# Patient Record
Sex: Male | Born: 1945 | Race: White | Hispanic: No | Smoking: Former smoker
Health system: Southern US, Community
[De-identification: ages and names within clinical notes are randomized; demographics above are authoritative.]

## PROBLEM LIST (undated history)

## (undated) ENCOUNTER — Emergency Department (HOSPITAL_COMMUNITY): Admission: EM | Payer: Medicare Other | Source: Home / Self Care

## (undated) DIAGNOSIS — I255 Ischemic cardiomyopathy: Secondary | ICD-10-CM

## (undated) DIAGNOSIS — I4510 Unspecified right bundle-branch block: Secondary | ICD-10-CM

## (undated) DIAGNOSIS — I2510 Atherosclerotic heart disease of native coronary artery without angina pectoris: Secondary | ICD-10-CM

## (undated) DIAGNOSIS — Z951 Presence of aortocoronary bypass graft: Secondary | ICD-10-CM

## (undated) DIAGNOSIS — E119 Type 2 diabetes mellitus without complications: Secondary | ICD-10-CM

## (undated) DIAGNOSIS — Z95810 Presence of automatic (implantable) cardiac defibrillator: Secondary | ICD-10-CM

## (undated) DIAGNOSIS — I480 Paroxysmal atrial fibrillation: Secondary | ICD-10-CM

## (undated) DIAGNOSIS — I10 Essential (primary) hypertension: Secondary | ICD-10-CM

## (undated) DIAGNOSIS — I82409 Acute embolism and thrombosis of unspecified deep veins of unspecified lower extremity: Secondary | ICD-10-CM

## (undated) DIAGNOSIS — E785 Hyperlipidemia, unspecified: Secondary | ICD-10-CM

## (undated) HISTORY — DX: Unspecified right bundle-branch block: I45.10

## (undated) HISTORY — DX: Essential (primary) hypertension: I10

## (undated) HISTORY — DX: Hyperlipidemia, unspecified: E78.5

## (undated) HISTORY — DX: Presence of automatic (implantable) cardiac defibrillator: Z95.810

## (undated) HISTORY — DX: Type 2 diabetes mellitus without complications: E11.9

## (undated) HISTORY — DX: Presence of aortocoronary bypass graft: Z95.1

## (undated) HISTORY — DX: Ischemic cardiomyopathy: I25.5

## (undated) HISTORY — DX: Atherosclerotic heart disease of native coronary artery without angina pectoris: I25.10

## (undated) HISTORY — PX: BACK SURGERY: SHX140

## (undated) HISTORY — DX: Paroxysmal atrial fibrillation: I48.0

---

## 1995-12-31 HISTORY — PX: CORONARY ARTERY BYPASS GRAFT: SHX141

## 1997-08-22 ENCOUNTER — Other Ambulatory Visit: Admission: RE | Admit: 1997-08-22 | Discharge: 1997-08-22 | Payer: Self-pay | Admitting: General Surgery

## 1997-08-27 ENCOUNTER — Ambulatory Visit (HOSPITAL_BASED_OUTPATIENT_CLINIC_OR_DEPARTMENT_OTHER): Admission: RE | Admit: 1997-08-27 | Discharge: 1997-08-27 | Payer: Self-pay | Admitting: General Surgery

## 1999-09-29 ENCOUNTER — Encounter: Payer: Self-pay | Admitting: Cardiovascular Disease

## 1999-09-29 ENCOUNTER — Inpatient Hospital Stay (HOSPITAL_COMMUNITY): Admission: AD | Admit: 1999-09-29 | Discharge: 1999-10-01 | Payer: Self-pay | Admitting: Cardiovascular Disease

## 2001-03-17 ENCOUNTER — Encounter: Payer: Self-pay | Admitting: Neurosurgery

## 2001-03-17 ENCOUNTER — Ambulatory Visit (HOSPITAL_COMMUNITY): Admission: RE | Admit: 2001-03-17 | Discharge: 2001-03-17 | Payer: Self-pay | Admitting: Neurosurgery

## 2001-04-06 ENCOUNTER — Encounter: Admission: RE | Admit: 2001-04-06 | Discharge: 2001-05-04 | Payer: Self-pay | Admitting: Neurosurgery

## 2002-05-31 ENCOUNTER — Encounter: Payer: Self-pay | Admitting: Emergency Medicine

## 2002-05-31 ENCOUNTER — Inpatient Hospital Stay (HOSPITAL_COMMUNITY): Admission: EM | Admit: 2002-05-31 | Discharge: 2002-06-01 | Payer: Self-pay | Admitting: Emergency Medicine

## 2002-05-31 ENCOUNTER — Encounter: Payer: Self-pay | Admitting: Cardiovascular Disease

## 2003-10-20 ENCOUNTER — Inpatient Hospital Stay (HOSPITAL_COMMUNITY): Admission: AD | Admit: 2003-10-20 | Discharge: 2003-10-22 | Payer: Self-pay | Admitting: Emergency Medicine

## 2004-07-15 ENCOUNTER — Inpatient Hospital Stay (HOSPITAL_COMMUNITY): Admission: AD | Admit: 2004-07-15 | Discharge: 2004-07-17 | Payer: Self-pay | Admitting: Internal Medicine

## 2004-10-04 ENCOUNTER — Emergency Department (HOSPITAL_COMMUNITY): Admission: EM | Admit: 2004-10-04 | Discharge: 2004-10-04 | Payer: Self-pay | Admitting: Emergency Medicine

## 2004-10-10 ENCOUNTER — Inpatient Hospital Stay (HOSPITAL_COMMUNITY): Admission: AD | Admit: 2004-10-10 | Discharge: 2004-10-18 | Payer: Self-pay | Admitting: Internal Medicine

## 2004-11-25 ENCOUNTER — Ambulatory Visit (HOSPITAL_COMMUNITY): Admission: RE | Admit: 2004-11-25 | Discharge: 2004-11-25 | Payer: Self-pay | Admitting: Urology

## 2005-02-18 ENCOUNTER — Ambulatory Visit (HOSPITAL_COMMUNITY): Admission: RE | Admit: 2005-02-18 | Discharge: 2005-02-18 | Payer: Self-pay | Admitting: Family Medicine

## 2005-03-25 ENCOUNTER — Emergency Department (HOSPITAL_COMMUNITY): Admission: EM | Admit: 2005-03-25 | Discharge: 2005-03-26 | Payer: Self-pay | Admitting: Emergency Medicine

## 2005-03-25 ENCOUNTER — Ambulatory Visit: Payer: Self-pay | Admitting: Orthopedic Surgery

## 2005-03-27 ENCOUNTER — Ambulatory Visit (HOSPITAL_COMMUNITY): Admission: RE | Admit: 2005-03-27 | Discharge: 2005-03-27 | Payer: Self-pay | Admitting: Family Medicine

## 2005-12-30 ENCOUNTER — Ambulatory Visit (HOSPITAL_COMMUNITY): Admission: RE | Admit: 2005-12-30 | Discharge: 2005-12-30 | Payer: Self-pay | Admitting: Internal Medicine

## 2006-11-17 ENCOUNTER — Encounter (INDEPENDENT_AMBULATORY_CARE_PROVIDER_SITE_OTHER): Payer: Self-pay | Admitting: Internal Medicine

## 2006-11-17 ENCOUNTER — Inpatient Hospital Stay (HOSPITAL_COMMUNITY): Admission: EM | Admit: 2006-11-17 | Discharge: 2006-11-18 | Payer: Self-pay | Admitting: Emergency Medicine

## 2006-12-07 ENCOUNTER — Ambulatory Visit: Payer: Self-pay | Admitting: Vascular Surgery

## 2006-12-07 ENCOUNTER — Inpatient Hospital Stay (HOSPITAL_COMMUNITY): Admission: EM | Admit: 2006-12-07 | Discharge: 2006-12-10 | Payer: Self-pay | Admitting: Emergency Medicine

## 2006-12-07 ENCOUNTER — Ambulatory Visit: Payer: Self-pay | Admitting: Oncology

## 2006-12-07 ENCOUNTER — Encounter: Payer: Self-pay | Admitting: Vascular Surgery

## 2006-12-14 ENCOUNTER — Ambulatory Visit: Payer: Self-pay | Admitting: Oncology

## 2006-12-24 ENCOUNTER — Ambulatory Visit: Payer: Self-pay | Admitting: Vascular Surgery

## 2007-01-05 ENCOUNTER — Inpatient Hospital Stay (HOSPITAL_COMMUNITY): Admission: EM | Admit: 2007-01-05 | Discharge: 2007-01-12 | Payer: Self-pay | Admitting: *Deleted

## 2007-01-07 ENCOUNTER — Encounter (INDEPENDENT_AMBULATORY_CARE_PROVIDER_SITE_OTHER): Payer: Self-pay | Admitting: Cardiology

## 2007-01-07 ENCOUNTER — Ambulatory Visit: Payer: Self-pay | Admitting: Surgery

## 2007-02-15 ENCOUNTER — Emergency Department (HOSPITAL_COMMUNITY): Admission: EM | Admit: 2007-02-15 | Discharge: 2007-02-16 | Payer: Self-pay | Admitting: Emergency Medicine

## 2007-02-16 ENCOUNTER — Ambulatory Visit (HOSPITAL_COMMUNITY): Admission: RE | Admit: 2007-02-16 | Discharge: 2007-02-16 | Payer: Self-pay | Admitting: Family Medicine

## 2007-02-17 ENCOUNTER — Emergency Department (HOSPITAL_COMMUNITY): Admission: EM | Admit: 2007-02-17 | Discharge: 2007-02-17 | Payer: Self-pay | Admitting: Emergency Medicine

## 2007-02-22 ENCOUNTER — Ambulatory Visit: Payer: Self-pay | Admitting: Oncology

## 2007-08-12 ENCOUNTER — Ambulatory Visit (HOSPITAL_COMMUNITY): Admission: RE | Admit: 2007-08-12 | Discharge: 2007-08-12 | Payer: Self-pay | Admitting: Internal Medicine

## 2008-11-24 ENCOUNTER — Encounter (INDEPENDENT_AMBULATORY_CARE_PROVIDER_SITE_OTHER): Payer: Self-pay | Admitting: Emergency Medicine

## 2008-11-24 ENCOUNTER — Inpatient Hospital Stay (HOSPITAL_COMMUNITY): Admission: EM | Admit: 2008-11-24 | Discharge: 2008-12-01 | Payer: Self-pay | Admitting: Emergency Medicine

## 2008-11-25 ENCOUNTER — Ambulatory Visit: Payer: Self-pay | Admitting: Surgery

## 2008-11-25 ENCOUNTER — Encounter: Payer: Self-pay | Admitting: Surgery

## 2008-11-26 ENCOUNTER — Encounter: Payer: Self-pay | Admitting: Cardiovascular Disease

## 2008-11-27 ENCOUNTER — Encounter: Payer: Self-pay | Admitting: Surgery

## 2008-11-28 ENCOUNTER — Encounter: Payer: Self-pay | Admitting: Surgery

## 2008-12-05 ENCOUNTER — Ambulatory Visit: Payer: Self-pay | Admitting: Gastroenterology

## 2008-12-05 ENCOUNTER — Inpatient Hospital Stay (HOSPITAL_COMMUNITY): Admission: EM | Admit: 2008-12-05 | Discharge: 2008-12-10 | Payer: Self-pay | Admitting: Emergency Medicine

## 2008-12-07 ENCOUNTER — Encounter: Payer: Self-pay | Admitting: Gastroenterology

## 2008-12-10 ENCOUNTER — Encounter: Payer: Self-pay | Admitting: Gastroenterology

## 2008-12-12 ENCOUNTER — Emergency Department (HOSPITAL_COMMUNITY): Admission: EM | Admit: 2008-12-12 | Discharge: 2008-12-12 | Payer: Self-pay | Admitting: Emergency Medicine

## 2008-12-14 ENCOUNTER — Emergency Department (HOSPITAL_COMMUNITY): Admission: EM | Admit: 2008-12-14 | Discharge: 2008-12-15 | Payer: Self-pay | Admitting: Emergency Medicine

## 2008-12-17 ENCOUNTER — Ambulatory Visit: Payer: Self-pay | Admitting: Surgery

## 2009-01-07 ENCOUNTER — Ambulatory Visit: Payer: Self-pay | Admitting: Surgery

## 2009-01-28 ENCOUNTER — Ambulatory Visit: Payer: Self-pay | Admitting: Surgery

## 2009-02-11 ENCOUNTER — Ambulatory Visit: Payer: Self-pay | Admitting: Surgery

## 2009-04-26 ENCOUNTER — Ambulatory Visit: Payer: Self-pay | Admitting: Surgery

## 2009-05-07 ENCOUNTER — Encounter: Payer: Self-pay | Admitting: Emergency Medicine

## 2009-05-07 ENCOUNTER — Encounter (INDEPENDENT_AMBULATORY_CARE_PROVIDER_SITE_OTHER): Payer: Self-pay | Admitting: Cardiovascular Disease

## 2009-05-07 ENCOUNTER — Inpatient Hospital Stay (HOSPITAL_COMMUNITY): Admission: EM | Admit: 2009-05-07 | Discharge: 2009-05-13 | Payer: Self-pay | Admitting: Cardiovascular Disease

## 2009-05-07 ENCOUNTER — Ambulatory Visit: Payer: Self-pay | Admitting: Internal Medicine

## 2009-05-08 HISTORY — PX: CARDIAC CATHETERIZATION: SHX172

## 2009-05-09 HISTORY — PX: CARDIAC DEFIBRILLATOR PLACEMENT: SHX171

## 2009-05-10 ENCOUNTER — Encounter: Payer: Self-pay | Admitting: Internal Medicine

## 2009-05-27 ENCOUNTER — Ambulatory Visit: Payer: Self-pay

## 2009-05-27 ENCOUNTER — Encounter: Payer: Self-pay | Admitting: Internal Medicine

## 2009-06-05 ENCOUNTER — Ambulatory Visit: Payer: Self-pay | Admitting: Internal Medicine

## 2009-07-02 ENCOUNTER — Emergency Department (HOSPITAL_COMMUNITY): Admission: EM | Admit: 2009-07-02 | Discharge: 2009-07-02 | Payer: Self-pay | Admitting: Emergency Medicine

## 2009-07-14 ENCOUNTER — Encounter: Payer: Self-pay | Admitting: Emergency Medicine

## 2009-07-14 ENCOUNTER — Inpatient Hospital Stay (HOSPITAL_COMMUNITY): Admission: EM | Admit: 2009-07-14 | Discharge: 2009-07-18 | Payer: Self-pay | Admitting: Cardiology

## 2009-07-14 ENCOUNTER — Ambulatory Visit: Payer: Self-pay | Admitting: Internal Medicine

## 2009-07-15 ENCOUNTER — Encounter: Payer: Self-pay | Admitting: Internal Medicine

## 2009-08-20 ENCOUNTER — Ambulatory Visit: Payer: Self-pay | Admitting: Internal Medicine

## 2009-08-20 DIAGNOSIS — E1122 Type 2 diabetes mellitus with diabetic chronic kidney disease: Secondary | ICD-10-CM

## 2009-08-20 DIAGNOSIS — Z95810 Presence of automatic (implantable) cardiac defibrillator: Secondary | ICD-10-CM

## 2009-08-20 DIAGNOSIS — Z950 Presence of cardiac pacemaker: Secondary | ICD-10-CM

## 2009-08-20 DIAGNOSIS — N183 Chronic kidney disease, stage 3 (moderate): Secondary | ICD-10-CM

## 2009-08-20 DIAGNOSIS — I5022 Chronic systolic (congestive) heart failure: Secondary | ICD-10-CM

## 2010-04-06 ENCOUNTER — Encounter: Payer: Self-pay | Admitting: Emergency Medicine

## 2010-04-06 ENCOUNTER — Encounter: Payer: Self-pay | Admitting: Internal Medicine

## 2010-04-15 NOTE — Assessment & Plan Note (Signed)
Summary: pc2/medtronic   Visit Type:  Follow-up   History of Present Illness: [name] returns today for ICD followup.  He is a pleasant 65 yo man with a h/o chronic systolic CHF, DM, HTN, and dyslipidemia.  He underwent BiV ICD implant in 04/2009 and returns today for followup.  He denies c/p, sob, or peripheral edema.  No intercurrent ICD therapies.  Current Medications (verified): 1)  Pradaxa 150 Mg Caps (Dabigatran Etexilate Mesylate) .Marland Kitchen.. 1 Tablet Two Times A Day 2)  Furosemide 20 Mg Tabs (Furosemide) .... As Needed 3)  Aspirin 81 Mg Tbec (Aspirin) .... Take One Tablet By Mouth Daily 4)  Metoprolol Tartrate 50 Mg Tabs (Metoprolol Tartrate) .... Take One Tablet By Mouth Twice A Day 5)  Synthroid 112 Mcg Tabs (Levothyroxine Sodium) .... Take One Tablet By Mouth Once Daily. 6)  Actos 45 Mg Tabs (Pioglitazone Hcl) .... Take One Tablet By Mouth Once Daily. 7)  Niaspan 1000 Mg Cr-Tabs (Niacin (Antihyperlipidemic)) .Marland Kitchen.. 1 Tablet At Bedtime 8)  Amiodarone Hcl 200 Mg Tabs (Amiodarone Hcl) .... Take One Tablet By Mouth Daily 9)  Zetia 10 Mg Tabs (Ezetimibe) .... Take One Tablet By Mouth Daily. 10)  Ambien 10 Mg Tabs (Zolpidem Tartrate) .... At Bedtime 11)  Digoxin 0.125 Mg Tabs (Digoxin) .... Take One Tablet By Mouth Daily 12)  Tylox 5-500 Mg Caps (Oxycodone-Acetaminophen) .... As Needed 13)  Lipitor 40 Mg Tabs (Atorvastatin Calcium) .... Take One Tablet By Mouth Daily. 14)  Metformin Hcl 500 Mg Tabs (Metformin Hcl) .... Take One Tablet By Mouth Twice Daily. 15)  Mag-Ox 400 400 Mg Tabs (Magnesium Oxide) .... Take One Tablet By Mouth Once Daily. 16)  Spironolactone 25 Mg Tabs (Spironolactone) .... Take One Tablet By Mouth Daily 17)  Ramipril 2.5 Mg Caps (Ramipril) .... Take One Capsule By Mouth Daily  Allergies (verified): No Known Drug Allergies  Past History:  Past Medical History: Current Problems:  DM (ICD-250.00) PACEMAKER, PERMANENT (ICD-V45.01)    Review of Systems  The  patient denies chest pain, syncope, dyspnea on exertion, and peripheral edema.    Vital Signs:  Patient profile:   65 year old male Height:      74 inches Weight:      226 pounds BMI:     29.12 Pulse rate:   75 / minute BP sitting:   150 / 80  (left arm)  Vitals Entered By: Laurance Flatten CMA (August 20, 2009 2:07 PM)  Physical Exam  General:  Elderly, well developed, well nourished, in no acute distress.  HEENT: normal Neck: supple. No JVD. Carotids 2+ bilaterally no bruits Cor: RRR no rubs, gallops or murmur Lungs: CTA.  Well healed ICD incision. Ab: soft, nontender. nondistended. No HSM. Good bowel sounds Ext: warm. no cyanosis, clubbing or edema Neuro: alert and oriented. Grossly nonfocal. affect pleasant     ICD Specifications Following MD:  Lewayne Bunting, MD     Referring MD:  BERRY ICD Vendor:  Medtronic     ICD Model Number:  [id number]     ICD Serial Number:  [id number] ICD DOI:  05/09/2009     ICD Implanting MD:  Lewayne Bunting, MD  Lead 1:    Location: RA     DOI: 05/09/2009     Model #: [id number]     Serial #: [id number]     Status: active Lead 2:    Location: RV     DOI: 05/09/2009     Model #: [id number]  Serial #: [id number]     Status: active Lead 3:    Location: LV     DOI: 05/09/2008     Model #: [id number]     Serial #: [id number]     Status: active  Indications::  VT; ICM   ICD Follow Up Battery Voltage:  3.20 V     Charge Time:  8.5 seconds     Underlying rhythm:  SB AT 40 ICD Dependent:  No       ICD Device Measurements Atrium:  Amplitude: 1.4 mV, Impedance: 532 ohms, Threshold: 0.75 V at 0.40 msec Right Ventricle:  Amplitude: 12.8 mV, Impedance: 532 ohms, Threshold: 0.75 V at 0.40 msec Left Ventricle:  Impedance: 627 ohms, Threshold: 0.75 V at 0.40 msec Configuration: LV TIP TO RV COIL Shock Impedance: 53/71 ohms   Episodes MS Episodes:  0     Percent Mode Switch:  0     Shock:  0     ATP:  0     Nonsustained:  0     Atrial Therapies:  0 Atrial Pacing:  66%      Ventricular Pacing:  99.4%  Brady Parameters Mode DDDR     Lower Rate Limit:  50     Upper Rate Limit 130 PAV 220     Sensed AV Delay:  200  Tachy Zones VF:  207     VT:  240 FVT VIA VF     VT1:  162     Tech Comments:  NORMAL DEVICE FUNCTION.  PT FOLLOWED THRU [facility].  RESEARCH STUDY.  CHANGED RV AMPLITUDE FROM 2.00 TO 2.5O V.  OPTIVOL STABLE. Vella Kohler  August 20, 2009 2:46 PM  MD Comments:  Agree with above.  Impression & Recommendations:  Problem # 1:  AUTOMATIC IMPLANTABLE CARDIAC DEFIBRILLATOR SITU (ICD-V45.02) His device is working normally.  Will recheck in several months.  Problem # 2:  CHRONIC SYSTOLIC HEART FAILURE (ICD-428.22) He remains class 2.  He will continue his current meds and followup in several months. His updated medication list for this problem includes:    Furosemide 20 Mg Tabs (Furosemide) .Marland Kitchen... As needed    Aspirin 81 Mg Tbec (Aspirin) .Marland Kitchen... Take one tablet by mouth daily    Metoprolol Tartrate 50 Mg Tabs (Metoprolol tartrate) .Marland Kitchen... Take one tablet by mouth twice a day    Amiodarone Hcl 200 Mg Tabs (Amiodarone hcl) .Marland Kitchen... Take one tablet by mouth daily    Digoxin 0.125 Mg Tabs (Digoxin) .Marland Kitchen... Take one tablet by mouth daily    Spironolactone 25 Mg Tabs (Spironolactone) .Marland Kitchen... Take one tablet by mouth daily    Ramipril 2.5 Mg Caps (Ramipril) .Marland Kitchen... Take one capsule by mouth daily  Patient Instructions: 1)  Your physician recommends that you schedule a follow-up appointment in: in 6 months with Dr Ladona Ridgel

## 2010-04-15 NOTE — Miscellaneous (Signed)
Summary: Device preload  Clinical Lists Changes  Observations: Added new observation of ICD INDICATN: VT; Low EF (05/10/2009 16:29) Added new observation of ICDLEADSTAT3: active (05/10/2009 16:29) Added new observation of ICDLEADSER3: [id number] (05/10/2009 16:29) Added new observation of ICDLEADMOD3: 4196  (05/10/2009 16:29) Added new observation of ICDLEADLOC3: LV  (05/10/2009 16:29) Added new observation of ICDLEADSTAT2: active  (05/10/2009 16:29) Added new observation of ICDLEADSER2: [id number] V  (05/10/2009 16:29) Added new observation of ICDLEADMOD2: 4627  (05/10/2009 16:29) Added new observation of ICDLEADLOC2: RV  (05/10/2009 16:29) Added new observation of ICDLEADSTAT1: active  (05/10/2009 16:29) Added new observation of ICDLEADSER1: [id number]  (05/10/2009 16:29) Added new observation of ICDLEADMOD1: 5076  (05/10/2009 16:29) Added new observation of ICDLEADLOC1: RA  (05/10/2009 16:29) Added new observation of ICD IMP MD: Alan Bunting, MD  (05/10/2009 16:29) Added new observation of ICDLEADDOI2: 05/09/2009  (05/10/2009 16:29) Added new observation of ICDLEADDOI1: 05/09/2009  (05/10/2009 16:29) Added new observation of ICD IMPL DTE: 05/09/2009  (05/10/2009 16:29) Added new observation of ICD SERL#: [id number]  (05/10/2009 16:29) Added new observation of ICD MODL#: [id number]  (05/10/2009 16:29) Added new observation of ICDMANUFACTR: Medtronic  (05/10/2009 16:29) Added new observation of ICD MD: Alan Bunting, MD  (05/10/2009 16:29)       ICD Specifications Following MD:  Alan Bunting, MD     ICD Vendor:  Medtronic     ICD Model Number:  [phone]     ICD Serial Number:  [id number] ICD DOI:  05/09/2009     ICD Implanting MD:  Alan Bunting, MD  Lead 1:    Location: RA     DOI: 05/09/2009     Model #: [id number]     Serial #: [id number]     Status: active Lead 2:    Location: RV     DOI: 05/09/2009     Model #: 6761     Serial #: [id number]     Status: active Lead 3:    Location: LV      Model #: [id number]     Serial #: [id number]     Status: active  Indications::  VT; Low EF

## 2010-04-15 NOTE — Cardiovascular Report (Signed)
Summary: Office Visit   Office Visit   Imported By: Roderic Ovens 08/30/2009 11:41:04  _____________________________________________________________________  External Attachment:    Type:   Image     Comment:   External Document

## 2010-04-15 NOTE — Cardiovascular Report (Signed)
Summary: Office Visit   Office Visit   Imported By: Roderic Ovens 06/13/2009 11:03:33  _____________________________________________________________________  External Attachment:    Type:   Image     Comment:   External Document

## 2010-04-15 NOTE — Procedures (Signed)
Summary: wound check    ICD Specifications Following MD:  Lewayne Bunting, MD     Referring MD:  BERRY ICD Vendor:  Medtronic     ICD Model Number:  [phone]     ICD Serial Number:  [id number] ICD DOI:  05/09/2009     ICD Implanting MD:  Lewayne Bunting, MD  Lead 1:    Location: RA     DOI: 05/09/2009     Model #: 7253     Serial #: [id number]     Status: active Lead 2:    Location: RV     DOI: 05/09/2009     Model #: [id number]     Serial #: [id number]     Status: active Lead 3:    Location: LV     DOI: 05/09/2008     Model #: 9518     Serial #: [id number]     Status: active  Indications::  VT; ICM   ICD Follow Up Remote Check?  No Battery Voltage:  3.22 V     Charge Time:  8.5 seconds     Underlying rhythm:  SR ICD Dependent:  No       ICD Device Measurements Atrium:  Amplitude: 1.0 mV, Impedance: 418 ohms, Threshold: 1.0 V at 0.4 msec Right Ventricle:  Amplitude: 12.5 mV, Impedance: 494 ohms, Threshold: 0.5 V at 0.4 msec Left Ventricle:  Impedance: 532 ohms, Threshold: 0.5 V at 0.4 msec Configuration: LV TIP TO RV COIL Shock Impedance: 45/55 ohms   Episodes MS Episodes:  0     Shock:  0     ATP:  1     Nonsustained:  0     Atrial Pacing:  33.1%     Ventricular Pacing:  91.0%  Brady Parameters Mode DDDR     Lower Rate Limit:  50     Upper Rate Limit 130 PAV 220     Sensed AV Delay:  200  Tachy Zones VF:  207     VT:  240 FVT VIA VF     VT1:  162     Next Cardiology Appt Due:  08/20/2009 Tech Comments:  Wound check appt.  Steri-strips removed.  Wound without redness or edema and well healed.  Normal device function.  1 episode of VT with a cycle length of treated with 1 round of burst pacing ATP on February 28th.  Pt asymptomatic with this episode.  Pt had sustained monomorphic VT while in hospital.  No changes made today.  ROV 3 months GT then plan [facility] to follow device. Gypsy Balsam RN BSN  May 27, 2009 10:57 AM  MD Comments:  Agree with above.

## 2010-06-03 LAB — COMPREHENSIVE METABOLIC PANEL
ALT: 18 U/L (ref 0–53)
ALT: 19 U/L (ref 0–53)
ALT: 19 U/L (ref 0–53)
ALT: 20 U/L (ref 0–53)
AST: 20 U/L (ref 0–37)
AST: 20 U/L (ref 0–37)
AST: 22 U/L (ref 0–37)
AST: 28 U/L (ref 0–37)
Albumin: 2.9 g/dL — ABNORMAL LOW (ref 3.5–5.2)
Albumin: 3.5 g/dL (ref 3.5–5.2)
Alkaline Phosphatase: 51 U/L (ref 39–117)
Alkaline Phosphatase: 58 U/L (ref 39–117)
CO2: 29 mEq/L (ref 19–32)
CO2: 29 mEq/L (ref 19–32)
CO2: 29 mEq/L (ref 19–32)
Calcium: 8.4 mg/dL (ref 8.4–10.5)
Calcium: 8.5 mg/dL (ref 8.4–10.5)
Calcium: 8.8 mg/dL (ref 8.4–10.5)
Calcium: 9.3 mg/dL (ref 8.4–10.5)
Chloride: 99 mEq/L (ref 96–112)
Creatinine, Ser: 1.2 mg/dL (ref 0.4–1.5)
Creatinine, Ser: 1.62 mg/dL — ABNORMAL HIGH (ref 0.4–1.5)
GFR calc Af Amer: 48 mL/min — ABNORMAL LOW (ref >60)
GFR calc Af Amer: 52 mL/min — ABNORMAL LOW (ref >60)
GFR calc Af Amer: >60 mL/min (ref >60)
GFR calc Af Amer: >60 mL/min (ref >60)
GFR calc non Af Amer: 39 mL/min — ABNORMAL LOW (ref >60)
Glucose, Bld: 100 mg/dL — ABNORMAL HIGH (ref 70–99)
Glucose, Bld: 81 mg/dL (ref 70–99)
Potassium: 3.5 mEq/L (ref 3.5–5.1)
Potassium: 3.8 mEq/L (ref 3.5–5.1)
Potassium: 3.9 mEq/L (ref 3.5–5.1)
Sodium: 131 mEq/L — ABNORMAL LOW (ref 135–145)
Sodium: 136 mEq/L (ref 135–145)
Sodium: 137 mEq/L (ref 135–145)
Sodium: 137 mEq/L (ref 135–145)
Total Protein: 5.3 g/dL — ABNORMAL LOW (ref 6.0–8.3)
Total Protein: 5.7 g/dL — ABNORMAL LOW (ref 6.0–8.3)
Total Protein: 6 g/dL (ref 6.0–8.3)

## 2010-06-03 LAB — URINALYSIS, ROUTINE W REFLEX MICROSCOPIC
Bilirubin Urine: NEGATIVE
Bilirubin Urine: NEGATIVE
Glucose, UA: NEGATIVE mg/dL
Glucose, UA: 250 mg/dL — AB
Hgb urine dipstick: NEGATIVE
Ketones, ur: NEGATIVE mg/dL
Ketones, ur: NEGATIVE mg/dL
Nitrite: NEGATIVE
Protein, ur: NEGATIVE mg/dL
Specific Gravity, Urine: 1.011 (ref 1.005–1.030)
pH: 6 (ref 5.0–8.0)

## 2010-06-03 LAB — DIFFERENTIAL
Basophils Absolute: 0 10*3/uL (ref 0.0–0.1)
Basophils Relative: 1 % (ref 0–1)
Basophils Relative: 1 % (ref 0–1)
Eosinophils Absolute: 0.2 10*3/uL (ref 0.0–0.7)
Eosinophils Relative: 2 % (ref 0–5)
Eosinophils Relative: 3 % (ref 0–5)
Lymphocytes Relative: 46 % (ref 12–46)
Monocytes Absolute: 1 10*3/uL (ref 0.1–1.0)
Monocytes Relative: 10 % (ref 3–12)

## 2010-06-03 LAB — CARDIAC PANEL(CRET KIN+CKTOT+MB+TROPI)
CK, MB: 0.9 ng/mL (ref 0.3–4.0)
CK, MB: 1.4 ng/mL (ref 0.3–4.0)
CK, MB: 1.6 ng/mL (ref 0.3–4.0)
CK, MB: 2 ng/mL (ref 0.3–4.0)
CK, MB: 2 ng/mL (ref 0.3–4.0)
Relative Index: 1.6 (ref 0.0–2.5)
Relative Index: INVALID (ref 0.0–2.5)
Relative Index: INVALID (ref 0.0–2.5)
Total CK: 122 U/L (ref 7–232)
Total CK: 123 U/L (ref 7–232)
Total CK: 69 U/L (ref 7–232)
Total CK: 96 U/L (ref 7–232)
Troponin I: 0.01 ng/mL (ref 0.00–0.06)
Troponin I: 0.01 ng/mL (ref 0.00–0.06)
Troponin I: 0.04 ng/mL (ref 0.00–0.06)

## 2010-06-03 LAB — CBC
HCT: 34 % — ABNORMAL LOW (ref 39.0–52.0)
Hemoglobin: 13.6 g/dL (ref 13.0–17.0)
Hemoglobin: 14.1 g/dL (ref 13.0–17.0)
Hemoglobin: 12.9 g/dL — ABNORMAL LOW (ref 13.0–17.0)
MCHC: 33.4 g/dL (ref 30.0–36.0)
MCHC: 33.6 g/dL (ref 30.0–36.0)
MCHC: 34.1 g/dL (ref 30.0–36.0)
MCHC: 34.3 g/dL (ref 30.0–36.0)
MCHC: 34.3 g/dL (ref 30.0–36.0)
MCV: 103 fL — ABNORMAL HIGH (ref 78.0–100.0)
MCV: 103.3 fL — ABNORMAL HIGH (ref 78.0–100.0)
MCV: 101.5 fL — ABNORMAL HIGH (ref 78.0–100.0)
Platelets: 136 10*3/uL — ABNORMAL LOW (ref 150–400)
Platelets: 182 10*3/uL (ref 150–400)
Platelets: 160 10*3/uL (ref 150–400)
RBC: 3.52 MIL/uL — ABNORMAL LOW (ref 4.22–5.81)
RBC: 3.83 MIL/uL — ABNORMAL LOW (ref 4.22–5.81)
RBC: 4.06 MIL/uL — ABNORMAL LOW (ref 4.22–5.81)
RBC: 3.71 MIL/uL — ABNORMAL LOW (ref 4.22–5.81)
RDW: 15.2 % (ref 11.5–15.5)
RDW: 15.7 % — ABNORMAL HIGH (ref 11.5–15.5)
RDW: 16 % — ABNORMAL HIGH (ref 11.5–15.5)
RDW: 16.2 % — ABNORMAL HIGH (ref 11.5–15.5)
WBC: 8.5 10*3/uL (ref 4.0–10.5)
WBC: 8.6 10*3/uL (ref 4.0–10.5)
WBC: 9.1 10*3/uL (ref 4.0–10.5)

## 2010-06-03 LAB — GLUCOSE, CAPILLARY
Glucose-Capillary: 128 mg/dL — ABNORMAL HIGH (ref 70–99)
Glucose-Capillary: 146 mg/dL — ABNORMAL HIGH (ref 70–99)
Glucose-Capillary: 148 mg/dL — ABNORMAL HIGH (ref 70–99)
Glucose-Capillary: 151 mg/dL — ABNORMAL HIGH (ref 70–99)
Glucose-Capillary: 98 mg/dL (ref 70–99)

## 2010-06-03 LAB — BASIC METABOLIC PANEL
BUN: 14 mg/dL (ref 6–23)
CO2: 31 mEq/L (ref 19–32)
CO2: 28 mEq/L (ref 19–32)
Calcium: 8.7 mg/dL (ref 8.4–10.5)
Chloride: 104 mEq/L (ref 96–112)
Chloride: 100 mEq/L (ref 96–112)
Creatinine, Ser: 1.14 mg/dL (ref 0.4–1.5)
GFR calc Af Amer: >60 mL/min (ref >60)
GFR calc Af Amer: >60 mL/min (ref >60)
GFR calc non Af Amer: >60 mL/min (ref >60)
Glucose, Bld: 190 mg/dL — ABNORMAL HIGH (ref 70–99)
Glucose, Bld: 96 mg/dL (ref 70–99)
Potassium: 5.1 mEq/L (ref 3.5–5.1)
Potassium: 4.1 mEq/L (ref 3.5–5.1)
Sodium: 136 mEq/L (ref 135–145)
Sodium: 136 mEq/L (ref 135–145)

## 2010-06-03 LAB — PROTIME-INR: Prothrombin Time: 18.8 seconds — ABNORMAL HIGH (ref 11.6–15.2)

## 2010-06-03 LAB — POCT CARDIAC MARKERS
CKMB, poc: 1.5 ng/mL (ref 1.0–8.0)
Troponin i, poc: <0.05 ng/mL (ref 0.00–0.09)

## 2010-06-03 LAB — TSH: TSH: 3.097 u[IU]/mL (ref 0.350–4.500)

## 2010-06-03 LAB — MRSA PCR SCREENING

## 2010-06-04 LAB — CBC
HCT: 34.5 % — ABNORMAL LOW (ref 39.0–52.0)
HCT: 35.8 % — ABNORMAL LOW (ref 39.0–52.0)
HCT: 37.2 % — ABNORMAL LOW (ref 39.0–52.0)
HCT: 40 % (ref 39.0–52.0)
HCT: 41.6 % (ref 39.0–52.0)
Hemoglobin: 12.5 g/dL — ABNORMAL LOW (ref 13.0–17.0)
Hemoglobin: 13.5 g/dL (ref 13.0–17.0)
Hemoglobin: 13.6 g/dL (ref 13.0–17.0)
MCHC: 33.2 g/dL (ref 30.0–36.0)
MCHC: 33.7 g/dL (ref 30.0–36.0)
MCHC: 33.9 g/dL (ref 30.0–36.0)
MCHC: 34.2 g/dL (ref 30.0–36.0)
MCHC: 32.7 g/dL (ref 30.0–36.0)
MCV: 97.6 fL (ref 78.0–100.0)
MCV: 98.3 fL (ref 78.0–100.0)
MCV: 98.9 fL (ref 78.0–100.0)
MCV: 99.5 fL (ref 78.0–100.0)
Platelets: 139 10*3/uL — ABNORMAL LOW (ref 150–400)
Platelets: 144 10*3/uL — ABNORMAL LOW (ref 150–400)
Platelets: 144 10*3/uL — ABNORMAL LOW (ref 150–400)
Platelets: 159 10*3/uL (ref 150–400)
Platelets: 171 10*3/uL (ref 150–400)
RBC: 3.49 MIL/uL — ABNORMAL LOW (ref 4.22–5.81)
RBC: 3.66 MIL/uL — ABNORMAL LOW (ref 4.22–5.81)
RBC: 3.96 MIL/uL — ABNORMAL LOW (ref 4.22–5.81)
RBC: 4.07 MIL/uL — ABNORMAL LOW (ref 4.22–5.81)
RBC: 4.24 MIL/uL (ref 4.22–5.81)
RDW: 17.5 % — ABNORMAL HIGH (ref 11.5–15.5)
RDW: 17.6 % — ABNORMAL HIGH (ref 11.5–15.5)
RDW: 17.8 % — ABNORMAL HIGH (ref 11.5–15.5)
RDW: 17.9 % — ABNORMAL HIGH (ref 11.5–15.5)
RDW: 17.7 % — ABNORMAL HIGH (ref 11.5–15.5)
WBC: 7.1 10*3/uL (ref 4.0–10.5)
WBC: 8.2 10*3/uL (ref 4.0–10.5)
WBC: 8.6 10*3/uL (ref 4.0–10.5)

## 2010-06-04 LAB — BASIC METABOLIC PANEL
BUN: 11 mg/dL (ref 6–23)
CO2: 28 mEq/L (ref 19–32)
CO2: 29 mEq/L (ref 19–32)
CO2: 30 mEq/L (ref 19–32)
Calcium: 9 mg/dL (ref 8.4–10.5)
Calcium: 8.9 mg/dL (ref 8.4–10.5)
Chloride: 100 mEq/L (ref 96–112)
Chloride: 105 mEq/L (ref 96–112)
GFR calc Af Amer: >60 mL/min (ref >60)
GFR calc Af Amer: >60 mL/min (ref >60)
GFR calc non Af Amer: >60 mL/min (ref >60)
Glucose, Bld: 131 mg/dL — ABNORMAL HIGH (ref 70–99)
Glucose, Bld: 93 mg/dL (ref 70–99)
Glucose, Bld: 171 mg/dL — ABNORMAL HIGH (ref 70–99)
Potassium: 3.7 mEq/L (ref 3.5–5.1)
Potassium: 3.6 mEq/L (ref 3.5–5.1)
Sodium: 137 mEq/L (ref 135–145)
Sodium: 141 mEq/L (ref 135–145)

## 2010-06-04 LAB — COMPREHENSIVE METABOLIC PANEL
ALT: 54 U/L — ABNORMAL HIGH (ref 0–53)
AST: 64 U/L — ABNORMAL HIGH (ref 0–37)
Albumin: 2.8 g/dL — ABNORMAL LOW (ref 3.5–5.2)
Albumin: 2.9 g/dL — ABNORMAL LOW (ref 3.5–5.2)
Alkaline Phosphatase: 62 U/L (ref 39–117)
BUN: 14 mg/dL (ref 6–23)
CO2: 27 mEq/L (ref 19–32)
Chloride: 109 mEq/L (ref 96–112)
Creatinine, Ser: 1.06 mg/dL (ref 0.4–1.5)
GFR calc Af Amer: >60 mL/min (ref >60)
GFR calc non Af Amer: >60 mL/min (ref >60)
Glucose, Bld: 96 mg/dL (ref 70–99)
Potassium: 4.5 mEq/L (ref 3.5–5.1)
Sodium: 141 mEq/L (ref 135–145)
Total Bilirubin: 0.7 mg/dL (ref 0.3–1.2)
Total Bilirubin: 1 mg/dL (ref 0.3–1.2)
Total Protein: 5.7 g/dL — ABNORMAL LOW (ref 6.0–8.3)

## 2010-06-04 LAB — DIFFERENTIAL
Basophils Absolute: 0 10*3/uL (ref 0.0–0.1)
Basophils Relative: 1 % (ref 0–1)
Eosinophils Absolute: 0.1 10*3/uL (ref 0.0–0.7)
Eosinophils Relative: 1 % (ref 0–5)
Eosinophils Relative: 2 % (ref 0–5)
Lymphs Abs: 2.2 10*3/uL (ref 0.7–4.0)
Monocytes Absolute: 0.6 10*3/uL (ref 0.1–1.0)
Monocytes Absolute: 1.1 10*3/uL — ABNORMAL HIGH (ref 0.1–1.0)
Monocytes Relative: 10 % (ref 3–12)
Neutro Abs: 3.6 10*3/uL (ref 1.7–7.7)

## 2010-06-04 LAB — POCT CARDIAC MARKERS
CKMB, poc: 1.3 ng/mL (ref 1.0–8.0)
Myoglobin, poc: 60.6 ng/mL (ref 12–200)
Troponin i, poc: <0.05 ng/mL (ref 0.00–0.09)

## 2010-06-04 LAB — GLUCOSE, CAPILLARY
Glucose-Capillary: 101 mg/dL — ABNORMAL HIGH (ref 70–99)
Glucose-Capillary: 105 mg/dL — ABNORMAL HIGH (ref 70–99)
Glucose-Capillary: 108 mg/dL — ABNORMAL HIGH (ref 70–99)
Glucose-Capillary: 123 mg/dL — ABNORMAL HIGH (ref 70–99)
Glucose-Capillary: 126 mg/dL — ABNORMAL HIGH (ref 70–99)
Glucose-Capillary: 134 mg/dL — ABNORMAL HIGH (ref 70–99)
Glucose-Capillary: 136 mg/dL — ABNORMAL HIGH (ref 70–99)
Glucose-Capillary: 154 mg/dL — ABNORMAL HIGH (ref 70–99)
Glucose-Capillary: 191 mg/dL — ABNORMAL HIGH (ref 70–99)
Glucose-Capillary: 191 mg/dL — ABNORMAL HIGH (ref 70–99)
Glucose-Capillary: 200 mg/dL — ABNORMAL HIGH (ref 70–99)
Glucose-Capillary: 217 mg/dL — ABNORMAL HIGH (ref 70–99)

## 2010-06-04 LAB — APTT
aPTT: 59 seconds — ABNORMAL HIGH (ref 24–37)
aPTT: 31 seconds (ref 24–37)

## 2010-06-04 LAB — CARDIAC PANEL(CRET KIN+CKTOT+MB+TROPI)
CK, MB: 10.2 ng/mL (ref 0.3–4.0)
CK, MB: 12.4 ng/mL (ref 0.3–4.0)
Relative Index: 5.6 — ABNORMAL HIGH (ref 0.0–2.5)
Total CK: 172 U/L (ref 7–232)
Total CK: 182 U/L (ref 7–232)
Troponin I: 1.78 ng/mL (ref 0.00–0.06)

## 2010-06-04 LAB — HEPARIN LEVEL (UNFRACTIONATED)
Heparin Unfractionated: 0.44 IU/mL (ref 0.30–0.70)
Heparin Unfractionated: 0.51 IU/mL (ref 0.30–0.70)

## 2010-06-04 LAB — DIGOXIN LEVEL: Digoxin Level: 1 ng/mL (ref 0.8–2.0)

## 2010-06-04 LAB — LIPID PANEL
Triglycerides: 46 mg/dL (ref <150)
VLDL: 9 mg/dL (ref 0–40)

## 2010-06-04 LAB — PROTIME-INR
INR: 1.29 (ref 0.00–1.49)
Prothrombin Time: 17.7 seconds — ABNORMAL HIGH (ref 11.6–15.2)

## 2010-06-04 LAB — SEX HORMONE BINDING GLOBULIN: Sex Hormone Binding: 57 nmol/L (ref 13–71)

## 2010-06-04 LAB — TSH: TSH: 1.189 u[IU]/mL (ref 0.350–4.500)

## 2010-06-04 LAB — MAGNESIUM: Magnesium: 2.3 mg/dL (ref 1.5–2.5)

## 2010-06-04 LAB — BRAIN NATRIURETIC PEPTIDE: Pro B Natriuretic peptide (BNP): 116 pg/mL — ABNORMAL HIGH (ref 0.0–100.0)

## 2010-06-19 LAB — DIFFERENTIAL
Eosinophils Absolute: 0.2 10*3/uL (ref 0.0–0.7)
Eosinophils Relative: 2 % (ref 0–5)
Lymphs Abs: 2.5 10*3/uL (ref 0.7–4.0)
Monocytes Relative: 9 % (ref 3–12)

## 2010-06-19 LAB — CBC
HCT: 31 % — ABNORMAL LOW (ref 39.0–52.0)
MCHC: 33 g/dL (ref 30.0–36.0)
MCV: 100.8 fL — ABNORMAL HIGH (ref 78.0–100.0)
RBC: 3.07 MIL/uL — ABNORMAL LOW (ref 4.22–5.81)
WBC: 6.8 10*3/uL (ref 4.0–10.5)

## 2010-06-19 LAB — POCT I-STAT, CHEM 8
BUN: 14 mg/dL (ref 6–23)
Calcium, Ion: 1.12 mmol/L (ref 1.12–1.32)
Chloride: 106 mEq/L (ref 96–112)
Creatinine, Ser: 0.8 mg/dL (ref 0.4–1.5)
Glucose, Bld: 138 mg/dL — ABNORMAL HIGH (ref 70–99)

## 2010-06-20 LAB — BASIC METABOLIC PANEL
BUN: 4 mg/dL — ABNORMAL LOW (ref 6–23)
BUN: 8 mg/dL (ref 6–23)
BUN: 8 mg/dL (ref 6–23)
BUN: 10 mg/dL (ref 6–23)
CO2: 29 mEq/L (ref 19–32)
CO2: 24 mEq/L (ref 19–32)
Calcium: 8.6 mg/dL (ref 8.4–10.5)
Calcium: 8.7 mg/dL (ref 8.4–10.5)
Calcium: 8.8 mg/dL (ref 8.4–10.5)
Chloride: 105 mEq/L (ref 96–112)
Chloride: 100 mEq/L (ref 96–112)
Creatinine, Ser: 0.89 mg/dL (ref 0.4–1.5)
GFR calc Af Amer: >60 mL/min (ref >60)
GFR calc Af Amer: >60 mL/min (ref >60)
GFR calc Af Amer: >60 mL/min (ref >60)
GFR calc non Af Amer: >60 mL/min (ref >60)
GFR calc non Af Amer: >60 mL/min (ref >60)
GFR calc non Af Amer: >60 mL/min (ref >60)
GFR calc non Af Amer: >60 mL/min (ref >60)
GFR calc non Af Amer: >60 mL/min (ref >60)
GFR calc non Af Amer: >60 mL/min (ref >60)
Glucose, Bld: 107 mg/dL — ABNORMAL HIGH (ref 70–99)
Glucose, Bld: 119 mg/dL — ABNORMAL HIGH (ref 70–99)
Glucose, Bld: 285 mg/dL — ABNORMAL HIGH (ref 70–99)
Glucose, Bld: 173 mg/dL — ABNORMAL HIGH (ref 70–99)
Potassium: 4 mEq/L (ref 3.5–5.1)
Potassium: 4.1 mEq/L (ref 3.5–5.1)
Potassium: 4.2 mEq/L (ref 3.5–5.1)
Potassium: 4.4 mEq/L (ref 3.5–5.1)
Potassium: 4.6 mEq/L (ref 3.5–5.1)
Potassium: 4.1 mEq/L (ref 3.5–5.1)
Potassium: 4.4 mEq/L (ref 3.5–5.1)
Sodium: 133 mEq/L — ABNORMAL LOW (ref 135–145)
Sodium: 137 mEq/L (ref 135–145)
Sodium: 139 mEq/L (ref 135–145)
Sodium: 140 mEq/L (ref 135–145)
Sodium: 139 mEq/L (ref 135–145)

## 2010-06-20 LAB — DIFFERENTIAL
Eosinophils Absolute: 0.2 10*3/uL (ref 0.0–0.7)
Eosinophils Absolute: 0.2 10*3/uL (ref 0.0–0.7)
Eosinophils Relative: 2 % (ref 0–5)
Eosinophils Relative: 3 % (ref 0–5)
Eosinophils Relative: 3 % (ref 0–5)
Lymphocytes Relative: 25 % (ref 12–46)
Lymphocytes Relative: 35 % (ref 12–46)
Lymphocytes Relative: 33 % (ref 12–46)
Lymphs Abs: 2.5 10*3/uL (ref 0.7–4.0)
Lymphs Abs: 2.6 10*3/uL (ref 0.7–4.0)
Lymphs Abs: 3.2 10*3/uL (ref 0.7–4.0)
Lymphs Abs: 2.8 10*3/uL (ref 0.7–4.0)
Monocytes Absolute: 0.7 10*3/uL (ref 0.1–1.0)
Monocytes Relative: 7 % (ref 3–12)
Monocytes Relative: 9 % (ref 3–12)
Neutro Abs: 3.7 10*3/uL (ref 1.7–7.7)
Neutrophils Relative %: 54 % (ref 43–77)

## 2010-06-20 LAB — HEMOGLOBIN A1C
Hgb A1c MFr Bld: 7.4 % — ABNORMAL HIGH (ref 4.6–6.1)
Mean Plasma Glucose: 166 mg/dL

## 2010-06-20 LAB — CBC
HCT: 25.9 % — ABNORMAL LOW (ref 39.0–52.0)
HCT: 26.3 % — ABNORMAL LOW (ref 39.0–52.0)
HCT: 26.9 % — ABNORMAL LOW (ref 39.0–52.0)
HCT: 27 % — ABNORMAL LOW (ref 39.0–52.0)
HCT: 27.9 % — ABNORMAL LOW (ref 39.0–52.0)
HCT: 28 % — ABNORMAL LOW (ref 39.0–52.0)
HCT: 29.3 % — ABNORMAL LOW (ref 39.0–52.0)
HCT: 29.3 % — ABNORMAL LOW (ref 39.0–52.0)
HCT: 30.7 % — ABNORMAL LOW (ref 39.0–52.0)
HCT: 31.9 % — ABNORMAL LOW (ref 39.0–52.0)
HCT: 30 % — ABNORMAL LOW (ref 39.0–52.0)
HCT: 36.7 % — ABNORMAL LOW (ref 39.0–52.0)
HCT: 40.9 % (ref 39.0–52.0)
HCT: 45.9 % (ref 39.0–52.0)
Hemoglobin: 10.2 g/dL — ABNORMAL LOW (ref 13.0–17.0)
Hemoglobin: 10.7 g/dL — ABNORMAL LOW (ref 13.0–17.0)
Hemoglobin: 8.6 g/dL — ABNORMAL LOW (ref 13.0–17.0)
Hemoglobin: 8.8 g/dL — ABNORMAL LOW (ref 13.0–17.0)
Hemoglobin: 9.1 g/dL — ABNORMAL LOW (ref 13.0–17.0)
Hemoglobin: 9.2 g/dL — ABNORMAL LOW (ref 13.0–17.0)
Hemoglobin: 9.4 g/dL — ABNORMAL LOW (ref 13.0–17.0)
Hemoglobin: 9.8 g/dL — ABNORMAL LOW (ref 13.0–17.0)
Hemoglobin: 10 g/dL — ABNORMAL LOW (ref 13.0–17.0)
Hemoglobin: 13.8 g/dL (ref 13.0–17.0)
Hemoglobin: 15.4 g/dL (ref 13.0–17.0)
MCHC: 33 g/dL (ref 30.0–36.0)
MCHC: 33.1 g/dL (ref 30.0–36.0)
MCHC: 33.5 g/dL (ref 30.0–36.0)
MCHC: 33.6 g/dL (ref 30.0–36.0)
MCHC: 33.6 g/dL (ref 30.0–36.0)
MCHC: 33.8 g/dL (ref 30.0–36.0)
MCHC: 33.5 g/dL (ref 30.0–36.0)
MCHC: 33.8 g/dL (ref 30.0–36.0)
MCHC: 33.9 g/dL (ref 30.0–36.0)
MCV: 100 fL (ref 78.0–100.0)
MCV: 100.3 fL — ABNORMAL HIGH (ref 78.0–100.0)
MCV: 100.5 fL — ABNORMAL HIGH (ref 78.0–100.0)
MCV: 100.6 fL — ABNORMAL HIGH (ref 78.0–100.0)
MCV: 99.6 fL (ref 78.0–100.0)
MCV: 100.8 fL — ABNORMAL HIGH (ref 78.0–100.0)
MCV: 99.8 fL (ref 78.0–100.0)
Platelets: 208 10*3/uL (ref 150–400)
Platelets: 333 10*3/uL (ref 150–400)
Platelets: 360 10*3/uL (ref 150–400)
Platelets: 375 10*3/uL (ref 150–400)
Platelets: 381 10*3/uL (ref 150–400)
Platelets: 179 10*3/uL (ref 150–400)
Platelets: 192 10*3/uL (ref 150–400)
RBC: 2.6 MIL/uL — ABNORMAL LOW (ref 4.22–5.81)
RBC: 2.79 MIL/uL — ABNORMAL LOW (ref 4.22–5.81)
RBC: 2.92 MIL/uL — ABNORMAL LOW (ref 4.22–5.81)
RBC: 3.06 MIL/uL — ABNORMAL LOW (ref 4.22–5.81)
RBC: 2.96 MIL/uL — ABNORMAL LOW (ref 4.22–5.81)
RBC: 3.64 MIL/uL — ABNORMAL LOW (ref 4.22–5.81)
RBC: 4.1 MIL/uL — ABNORMAL LOW (ref 4.22–5.81)
RBC: 4.54 MIL/uL (ref 4.22–5.81)
RDW: 13.8 % (ref 11.5–15.5)
RDW: 14.1 % (ref 11.5–15.5)
RDW: 14.2 % (ref 11.5–15.5)
RDW: 14.3 % (ref 11.5–15.5)
RDW: 14.3 % (ref 11.5–15.5)
RDW: 14.4 % (ref 11.5–15.5)
RDW: 15.3 % (ref 11.5–15.5)
RDW: 14 % (ref 11.5–15.5)
RDW: 14.3 % (ref 11.5–15.5)
WBC: 10.7 10*3/uL — ABNORMAL HIGH (ref 4.0–10.5)
WBC: 7.1 10*3/uL (ref 4.0–10.5)
WBC: 7.5 10*3/uL (ref 4.0–10.5)
WBC: 8.2 10*3/uL (ref 4.0–10.5)
WBC: 8.3 10*3/uL (ref 4.0–10.5)
WBC: 11.3 10*3/uL — ABNORMAL HIGH (ref 4.0–10.5)
WBC: 8.5 10*3/uL (ref 4.0–10.5)
WBC: 9.1 10*3/uL (ref 4.0–10.5)

## 2010-06-20 LAB — GLUCOSE, CAPILLARY
Glucose-Capillary: 104 mg/dL — ABNORMAL HIGH (ref 70–99)
Glucose-Capillary: 109 mg/dL — ABNORMAL HIGH (ref 70–99)
Glucose-Capillary: 109 mg/dL — ABNORMAL HIGH (ref 70–99)
Glucose-Capillary: 111 mg/dL — ABNORMAL HIGH (ref 70–99)
Glucose-Capillary: 128 mg/dL — ABNORMAL HIGH (ref 70–99)
Glucose-Capillary: 129 mg/dL — ABNORMAL HIGH (ref 70–99)
Glucose-Capillary: 131 mg/dL — ABNORMAL HIGH (ref 70–99)
Glucose-Capillary: 132 mg/dL — ABNORMAL HIGH (ref 70–99)
Glucose-Capillary: 158 mg/dL — ABNORMAL HIGH (ref 70–99)
Glucose-Capillary: 158 mg/dL — ABNORMAL HIGH (ref 70–99)
Glucose-Capillary: 160 mg/dL — ABNORMAL HIGH (ref 70–99)
Glucose-Capillary: 178 mg/dL — ABNORMAL HIGH (ref 70–99)
Glucose-Capillary: 180 mg/dL — ABNORMAL HIGH (ref 70–99)
Glucose-Capillary: 183 mg/dL — ABNORMAL HIGH (ref 70–99)
Glucose-Capillary: 188 mg/dL — ABNORMAL HIGH (ref 70–99)
Glucose-Capillary: 141 mg/dL — ABNORMAL HIGH (ref 70–99)
Glucose-Capillary: 145 mg/dL — ABNORMAL HIGH (ref 70–99)
Glucose-Capillary: 189 mg/dL — ABNORMAL HIGH (ref 70–99)
Glucose-Capillary: 201 mg/dL — ABNORMAL HIGH (ref 70–99)
Glucose-Capillary: 211 mg/dL — ABNORMAL HIGH (ref 70–99)
Glucose-Capillary: 96 mg/dL (ref 70–99)

## 2010-06-20 LAB — URINALYSIS, ROUTINE W REFLEX MICROSCOPIC
Glucose, UA: 100 mg/dL — AB
Ketones, ur: NEGATIVE mg/dL
Nitrite: NEGATIVE
Specific Gravity, Urine: 1.01 (ref 1.005–1.030)
pH: 6 (ref 5.0–8.0)

## 2010-06-20 LAB — PROTIME-INR
INR: 1.4 (ref 0.00–1.49)
INR: 1.6 — ABNORMAL HIGH (ref 0.00–1.49)
INR: 1.8 — ABNORMAL HIGH (ref 0.00–1.49)
INR: 2.1 — ABNORMAL HIGH (ref 0.00–1.49)
INR: 2.5 — ABNORMAL HIGH (ref 0.00–1.49)
INR: 3.7 — ABNORMAL HIGH (ref 0.00–1.49)
INR: 1.3 (ref 0.00–1.49)
INR: 1.6 — ABNORMAL HIGH (ref 0.00–1.49)
Prothrombin Time: 16.7 seconds — ABNORMAL HIGH (ref 11.6–15.2)
Prothrombin Time: 26.8 seconds — ABNORMAL HIGH (ref 11.6–15.2)
Prothrombin Time: 32.3 seconds — ABNORMAL HIGH (ref 11.6–15.2)
Prothrombin Time: 36.4 seconds — ABNORMAL HIGH (ref 11.6–15.2)
Prothrombin Time: 14.9 seconds (ref 11.6–15.2)
Prothrombin Time: 15.6 seconds — ABNORMAL HIGH (ref 11.6–15.2)

## 2010-06-20 LAB — URINALYSIS, MICROSCOPIC ONLY
Bilirubin Urine: NEGATIVE
Glucose, UA: 250 mg/dL — AB
Hgb urine dipstick: NEGATIVE
Nitrite: NEGATIVE
Specific Gravity, Urine: 1.016 (ref 1.005–1.030)
pH: 6.5 (ref 5.0–8.0)

## 2010-06-20 LAB — TYPE AND SCREEN
Antibody Screen: NEGATIVE
Antibody Screen: NEGATIVE

## 2010-06-20 LAB — HEMOGLOBIN AND HEMATOCRIT, BLOOD
HCT: 24.6 % — ABNORMAL LOW (ref 39.0–52.0)
HCT: 25.2 % — ABNORMAL LOW (ref 39.0–52.0)
HCT: 26.5 % — ABNORMAL LOW (ref 39.0–52.0)
HCT: 26.9 % — ABNORMAL LOW (ref 39.0–52.0)
Hemoglobin: 8.2 g/dL — ABNORMAL LOW (ref 13.0–17.0)
Hemoglobin: 8.9 g/dL — ABNORMAL LOW (ref 13.0–17.0)
Hemoglobin: 8.9 g/dL — ABNORMAL LOW (ref 13.0–17.0)

## 2010-06-20 LAB — URINE CULTURE: Colony Count: 80000

## 2010-06-20 LAB — COMPREHENSIVE METABOLIC PANEL
ALT: 27 U/L (ref 0–53)
CO2: 27 mEq/L (ref 19–32)
Calcium: 8.4 mg/dL (ref 8.4–10.5)
Creatinine, Ser: 0.93 mg/dL (ref 0.4–1.5)
GFR calc non Af Amer: >60 mL/min (ref >60)
Glucose, Bld: 160 mg/dL — ABNORMAL HIGH (ref 70–99)

## 2010-06-20 LAB — HEPARIN LEVEL (UNFRACTIONATED)
Heparin Unfractionated: 0.59 IU/mL (ref 0.30–0.70)
Heparin Unfractionated: 0.14 IU/mL — ABNORMAL LOW (ref 0.30–0.70)
Heparin Unfractionated: 0.37 IU/mL (ref 0.30–0.70)
Heparin Unfractionated: 0.5 IU/mL (ref 0.30–0.70)

## 2010-06-20 LAB — POCT CARDIAC MARKERS: Troponin i, poc: <0.05 ng/mL (ref 0.00–0.09)

## 2010-06-20 LAB — APTT: aPTT: 175 seconds — ABNORMAL HIGH (ref 24–37)

## 2010-07-24 ENCOUNTER — Emergency Department (HOSPITAL_COMMUNITY): Payer: Medicare Other

## 2010-07-24 ENCOUNTER — Emergency Department (HOSPITAL_COMMUNITY)
Admission: EM | Admit: 2010-07-24 | Discharge: 2010-07-24 | Disposition: A | Discharge status: Home / Self Care | Payer: Medicare Other | Attending: Emergency Medicine | Admitting: Emergency Medicine

## 2010-07-24 DIAGNOSIS — IMO0002 Reserved for concepts with insufficient information to code with codable children: Secondary | ICD-10-CM | POA: Insufficient documentation

## 2010-07-24 DIAGNOSIS — E7800 Pure hypercholesterolemia, unspecified: Secondary | ICD-10-CM | POA: Insufficient documentation

## 2010-07-24 DIAGNOSIS — I252 Old myocardial infarction: Secondary | ICD-10-CM | POA: Insufficient documentation

## 2010-07-24 DIAGNOSIS — Z79899 Other long term (current) drug therapy: Secondary | ICD-10-CM | POA: Insufficient documentation

## 2010-07-24 DIAGNOSIS — Z86718 Personal history of other venous thrombosis and embolism: Secondary | ICD-10-CM | POA: Insufficient documentation

## 2010-07-24 DIAGNOSIS — I10 Essential (primary) hypertension: Secondary | ICD-10-CM | POA: Insufficient documentation

## 2010-07-24 DIAGNOSIS — I2510 Atherosclerotic heart disease of native coronary artery without angina pectoris: Secondary | ICD-10-CM | POA: Insufficient documentation

## 2010-07-24 DIAGNOSIS — E119 Type 2 diabetes mellitus without complications: Secondary | ICD-10-CM | POA: Insufficient documentation

## 2010-07-24 LAB — CBC
HCT: 39.5 % (ref 39.0–52.0)
MCV: 102.1 fL — ABNORMAL HIGH (ref 78.0–100.0)
RBC: 3.87 MIL/uL — ABNORMAL LOW (ref 4.22–5.81)
RDW: 14.6 % (ref 11.5–15.5)
WBC: 10.2 10*3/uL (ref 4.0–10.5)

## 2010-07-24 LAB — DIFFERENTIAL
Eosinophils Relative: 1 % (ref 0–5)
Lymphocytes Relative: 17 % (ref 12–46)
Lymphs Abs: 1.8 10*3/uL (ref 0.7–4.0)

## 2010-07-24 LAB — BASIC METABOLIC PANEL
BUN: 12 mg/dL (ref 6–23)
Chloride: 98 mEq/L (ref 96–112)
Glucose, Bld: 122 mg/dL — ABNORMAL HIGH (ref 70–99)
Potassium: 4.3 mEq/L (ref 3.5–5.1)

## 2010-07-24 LAB — PROTIME-INR: INR: 1.21 (ref 0.00–1.49)

## 2010-07-25 ENCOUNTER — Ambulatory Visit (HOSPITAL_COMMUNITY)
Admit: 2010-07-25 | Discharge: 2010-07-25 | Disposition: A | Discharge status: Home / Self Care | Payer: Medicare Other | Source: Ambulatory Visit | Attending: Emergency Medicine | Admitting: Emergency Medicine

## 2010-07-25 DIAGNOSIS — M79609 Pain in unspecified limb: Secondary | ICD-10-CM | POA: Insufficient documentation

## 2010-07-29 NOTE — Cardiovascular Report (Signed)
NAME:  Alan Orr, Alan Orr NO.:  [id number]   MEDICAL RECORD NO.:  [id number]          PATIENT TYPE:  INP   LOCATION:  3734                         FACILITY:  [facility]   PHYSICIAN:  Ritta Slot, MD     DATE OF BIRTH:  Sep 22, 1945   DATE OF PROCEDURE:  12/08/2006  DATE OF DISCHARGE:                            CARDIAC CATHETERIZATION   PROCEDURE:  Cardiac catheterization.   CARDIOLOGIST:  Ritta Slot, MD   INDICATIONS:  The patient is a 65 year old gentleman with a history of  CABG by Dr. Sheliah Plane on 11/15/2005.  He was catheterized in 2004  and found to have patent graft.  Last night he was admitted to [facility] with an occluded left brachiocephalic artery for which he  underwent thrombectomy by Dr. Colin Benton in the middle of the night.  His CT  scan showed an approximately 8.5 cm occlusion axillary to the  brachiocephalic vessel artery and noncalcified thrombus and Dr. Colin Benton  was contacted and he was sent for thrombectomy.  He has done well since  then but this morning developed essential crushing chest pain 8/10 with  ECG changes of ST wave inversion in the lateral leads.  His troponin had  bumped to 2 and it was decided to bring him to the cardiac  catheterization for further evaluation of his coronary arteries.   DESCRIPTION OF PROCEDURE:  The patient was brought to the second floor  cardiac catheterization lab in the post absorptive state.  He was  premedicated with p.o. Valium. His right groin was prepped and draped in  the usual sterile fashion; 1% Xylocaine was used for local anesthesia, 6  French sheath was inserted in the right femoral artery and standard  Seldinger technique.  A 6 French right and left Judkins diagnostic  catheter as well as the pigtail catheter were used for selective  coronary angiography and subselective vein graft angiography, selective  IMA angiography.  Omnipaque was used for the entirety of the case.  Retrograde  aortic pressures were recorded.  When ACT was measured, the  sheaths were removed.  Pressure was held in the groin to achieve  hemostasis.  The patient left the lab in stable condition.   Hemodynamics:  Exam of the aortic pressures were 140/69 with a mean of  96%.  No LV gram was performed.  Results of coronary angiography:  The  left main was normal.  The left anterior descending artery was occluded  at its proximal portion with 100% occlusion just distal to the take-off  of the diagonal.  The ramus intermedius branch, moderate in size and  widely patent.  Circumflex widely patent and the distal marginal branch  which was occluded.  Right coronary artery was dominant and occluded  proximally. Going to the PDA and PLA sequentially widely patent vein  graft to the distal obtuse marginal and widely patent vein graft to the  intermediate ramus widely patent.  Internal mammary to the LAD was  widely patent but occluded but the LAD was distally occluded just after  the insertion of the LIMA to the LAD.  This  was not present on prior  catheterization in 2004.  Furthermore the LAD was occluded proximally,  had 100% occlusion proximally just off the start of the take-off of the  diagonal, which, once again, was not present on previous catheterization  of 2004.  The left ventricle was not engaged due to suspicion of apical  thrombus.   IMPRESSION:  The patient has a new occlusion in the left anterior  descending both proximally 100% just off the take-off of the diagonal  and also distally just off the insertion of the left internal mammary  artery to the left anterior descending both of which were not present  prior on the prior cath of 2004.  There is a suspicion that he does have  an left ventricular apical thrombus both from the prior cath of 2004 and  on a recent echo of 2008.  My suspicion is that he has an left  ventricular apical thrombus that is embolized down the left anterior   descending as well as down the left brachiocephalic, producing symptoms  of left brachiocephalic occlusion and left anterior descending  occlusion.  The case was discussed and the films were reviewed by Dr.  Daphene Jaeger, my partner, who is interventional cardiology and myself  today.  We reviewed both films and felt that the distal left anterior  descending coronary artery vessel was small and narrow less than 2.0 in  diameter and in view of the fact that he already had apical akinesis  with a suspicion of aneurysm in region, that attempting to reopen this  left anterior descending would be futile and difficult and not  successful. The patient was pain free at the time the decision was made.  There were no further ECG changes and he was in stable condition.  It  was decided to place him on medical therapy, reinitiate IV heparin 6  hours post sheath removal and proceed to transthoracic echocardiography  +/- transesophageal echocardiogram to exclude left ventricular apical  thrombus. Should he have an left ventricular apical thrombus then he is  a candidate for Coumadin therapy and we proceed to do this.      Ritta Slot, MD  Electronically Signed     HS/MEDQ  D:  12/08/2006  T:  12/08/2006  Job:  [id number]

## 2010-07-29 NOTE — Assessment & Plan Note (Signed)
OFFICE VISIT   Alan Orr, Alan Orr  DOB:  08/08/1945                                       02/11/2009  WUXLK#:[id number]   The patient comes back today after having undergone popliteal and tibial  embolectomy with patch angioplasty when he suffered an embolic occlusion  and ischemic left leg.  This was done on November 25, 2008.  He has  also undergone brachial embolectomy by Dr. Arbie Cookey 2 years ago.  Both  episodes occurred in subtherapeutic Coumadin levels.  He did develop a  GI bleed after his operation and also had some wound separation.  We  have been treating him with wet-to-dry dressing changes.  The wound is  now nearly healed.  He has had a good result.  I will not plan on seeing  him back.  He will, however be scheduled for ultrasounds to evaluate his  patch angioplasty.  He does have regular see Dr. Allyson Sabal.  I told him that  Dr. Allyson Sabal could take over these ultrasounds, but the first one will be  done in my office.   Jorge Ny, MD  Electronically Signed   VWB/MEDQ  D:  02/11/2009  T:  02/11/2009  Job:  [id number]   cc:   Nanetta Batty, M.D.

## 2010-07-29 NOTE — Assessment & Plan Note (Signed)
OFFICE VISIT   [name], [name]  DOB:  05/15/1945                                       01/28/2009  EAVWU#:[id number]   REASON FOR VISIT:  Wound check.   The patient comes back today after having undergone popliteal and tibial  embolectomy with patch angioplasty after he suffered an embolic  occlusion and ischemic leg.  He has a history of having had a brachial  embolectomy by Dr. Arbie Cookey 2 years ago.  This has all occurred in the  setting of subtherapeutic Coumadin levels.  He came back in on Coumadin  with a GI bleed.  He did develop a wound separation and we have been  dealing with a wound.  The last time I saw him, it did not look very  healthy.  I switched him to wet-to-dry dressing changes.  He comes back  in today for followup.  On examination, his wound looks significantly  improved.  I touched it with silver nitrate today and placed him in a  dry dressing and we will see him back in 2 weeks.  Hopefully it will be  healed by that time.   Jorge Ny, MD  Electronically Signed   VWB/MEDQ  D:  01/28/2009  T:  01/29/2009  Job:  [id number]

## 2010-07-29 NOTE — Consult Note (Signed)
NAME:  Alan Orr, Alan Orr              ACCOUNT NO.:  [id number]   MEDICAL RECORD NO.:  [id number]          PATIENT TYPE:  INP   LOCATION:  3705                         FACILITY:  [facility]   PHYSICIAN:  Juleen China IV, MDDATE OF BIRTH:  [birth year]   DATE OF CONSULTATION:  01/07/2007  DATE OF DISCHARGE:                                 CONSULTATION   REFERRING PHYSICIAN:  Dr. Orvan Falconer.   REASON FOR CONSULTATION:  Right arm pain, status post cath.   HISTORY:  This is a 65 year old gentleman who presented with chest pain  on January 05, 2007.  He underwent diagnostic cardiac catheterization  today. In the recovery room he was noted to have severe right arm pain  with diminished pulses by exam.  The patient had been previously  catheterized 3 weeks ago and had similar symptoms and was taken to the  operating room for thromboembolectomy with resolution of his symptoms.   REVIEW OF SYSTEMS:  Positive for right arm pain.  Positive for numbness.  No chest pain, no shortness of breath.  No abdominal pain.  No  headaches.   PAST MEDICAL HISTORY:  1. Coronary artery disease.  2. Hypertension.  3. Diabetes.  4. Hypothyroid.   PAST SURGICAL HISTORY:  1. Left brachial embolectomy.  2. CABG.   FAMILY HISTORY:  Positive for coronary artery disease.   SOCIAL HISTORY:  History of tobacco but quite many years ago.  No  alcohol.   MEDICATIONS:  Please see the patient's chart.   PHYSICAL EXAMINATION:  VITAL SIGNS:  The patient is afebrile,  hemodynamically stable.  GENERAL:  He is well-appearing in no acute distress.  CARDIOVASCULAR:  Regular rate and rhythm. Respirations nonlabored.  ABDOMEN:  Soft.  EXTREMITIES:  The right arm is warm and is similar temperature to the  left.  There is a palpable radial pulse as well as a brachial pulse.  The patient has sensation and motor function intact.   DIAGNOSTIC STUDIES:  A duplex evaluation was performed which revealed  triphasic signals in the  subclavian, axillary and brachial artery and  biphasic signals in the radial and ulnar artery.  The signals on the  right arm which is the symptomatic arm are better than those on the left  which is asymptomatic.   ASSESSMENT:  Right arm pain status post cardiac catheterization.   PLAN:  At this point I do not feel that the patient's symptoms are due  to arterial thrombus or occlusive disease.  He has bounding radial pulse  and no motor or sensory deficits.  For that reason I would not take him  to the operating room even though this is a similar presentation as far  as his symptoms go from the last episode which resolved with  embolectomy.  On duplex no evidence of stenosis or thrombus was  identified in his right arm.  I would, however, keep him on heparin  overnight.  It seems that his pain is improving and I would expect him  to be able to be discharged home tomorrow assuming he continues to  improve.  Jorge Ny, MD  Electronically Signed     VWB/MEDQ  D:  01/07/2007  T:  01/08/2007  Job:  [id number]

## 2010-07-29 NOTE — Procedures (Signed)
BYPASS GRAFT EVALUATION   INDICATION:  Followup left lower extremity revascularization.   HISTORY:  Diabetes:  Yes.  Cardiac:  CABG.  Hypertension:  Yes.  Smoking:  Previous.  Previous Surgery:  Thrombectomy of left popliteal, posterior tibial and  anterior tibial arteries with patch angioplasty of popliteal artery  11/25/2008 by Dr. Myra Gianotti.   SINGLE LEVEL ARTERIAL EXAM                               RIGHT              LEFT  Brachial:                    149                137  Anterior tibial:             183                161  Posterior tibial:            171                179  Peroneal:  Ankle/brachial index:        1.23               1.20   PREVIOUS ABI:  Date:  11/27/2008  RIGHT:  1.19  LEFT:  0.92   LOWER EXTREMITY BYPASS GRAFT DUPLEX EXAM:   DUPLEX:  Patent left lower extremity arteries where visualized.  Slightly elevated left common femoral artery velocities of 232 cm/s.  Healing wound area on left proximal medial calf.   IMPRESSION:  1. Right ankle brachial index appears stable from initial postop      study.  2. Left ankle brachial index shows significant increase from initial      postop study.  3. Patent left lower extremity arteries where visualized.  4. Healing wound on left proximal medial calf.  5. Note:  Per Dr. Estanislado Spire note continued ultrasounds after this      would be done at Dr. Hazle Coca office.   ___________________________________________  Seth Bake. Charlena Cross, MD   AS/MEDQ  D:  04/26/2009  T:  04/26/2009  Job:  [id number]   cc:   Nanetta Batty, M.D.

## 2010-07-29 NOTE — H&P (Signed)
NAME:  Alan Orr, Alan Orr              ACCOUNT NO.:  [id number]   MEDICAL RECORD NO.:  [id number]          PATIENT TYPE:  INP   LOCATION:  A210                          FACILITY:  [facility]   PHYSICIAN:  Marcello Moores, MD   DATE OF BIRTH:  Aug 07, 1945   DATE OF ADMISSION:  11/16/2006  DATE OF DISCHARGE:  LH                              HISTORY & PHYSICAL   PRIMARY CARE PHYSICIAN:  Madelin Rear. Sherwood Gambler, MD.   CARDIOLOGIST:  He follows with [facility]   CHIEF COMPLAINT:  Chest discomfort and left lower leg pain.   HISTORY OF PRESENT ILLNESS:  Alan Orr is 65 year old male patient  with history of coronary artery disease status post CABG, history of  diabetes mellitus, hypertension, and history of hypothyroidism.  He came  in with the above complaints.  The patient stated that he has chest  discomfort, intermittently, around 4-6/10 for the last few days.  Yesterday he had the same chest discomfort associated with his left  lower leg pain as well.  The pain is a deep-seated, aching pain which  has been there for the last 6 months, intermittently.  He could not  really associate it with working or exercise; but he had it  intermittently; and yesterday while he was trying to walk a little bit,  he did have this sensation and he decided to come to the emergency room.  When I tried to interview him about this pain, I could not extract that  it was persistent with vascular claudication, but it is not persistent  with neurologic pain.  The patient has multiple medical problems  coronary artery disease and diabetes mellitus with hypertension.  He  states that he has regular follow ups with his PMD as well as his  cardiologist.  Otherwise he has no vomiting, no palpitations.  No  pulmonary or any GI or urinary complaints.   REVIEW OF SYSTEMS:  A 10-point review of system is noncontributory for  this admission.  He has no fever, no headache, no visual disturbance.   ALLERGIES:   He has no known drug allergies.   PAST MEDICAL HISTORY:  1. Coronary artery disease status CABG in 1997.  2. Diabetes mellitus.  3. Hypertension.  4. Hypothyroidism.  5. Hyperlipidemia.  6. Gastroesophageal reflux disease.  7. History of osteoarthritis.   HOME MEDICATIONS:  1. Ambien 10 mg p.o. p.r.n. at bedtime.  2. Aspirin 325 mg p.o. daily.  3. Zetia 10 mg once a day.  4. Nitroglycerin p.r.n. sublingual.  5. Lanoxin 250 mcg once a day.  6. Niaspan 1000 mg in evening.  7. Altace 5 mg p.o. daily.  8. Lipitor 40 mg daily.  9. Prevacid 30 mg once a day.  10.Synthroid 112 mcg once a day.  11.Avandia 4 mg once a day.  12.Tylox 5/500 p.r.n.   PHYSICAL EXAMINATION:  GENERAL:  The patient is lying without any  distress.  VITAL SIGNS: Temperature 98.8, pulse 75, respiratory rate 20, and blood  pressure is 147/78.  Saturation is 96% on room air.  HEENT:  He has pink conjunctivae.  Nonicteric sclerae.  NECK:  Supple.  CHEST: Good air entry bilaterally.  CARDIOVASCULAR SYSTEM:  S1-S2 regular no murmur appreciated.  ABDOMEN:  Soft.  No area of tenderness.  Normoactive bowel sounds.  EXTREMITIES:  He has no pedal or pretibial edema.  Peripheral pulses are  positive on both sides.  Dorsalis pedis is palpable on both sides, left  and right side.  CENTRAL NERVOUS SYSTEM:  He is alert and well oriented; and there are no  neurological deficits.  Sensation on both left-and-right lower  extremities are intact.  The power is also 5/5 with no deficit.   LABS:  White blood cell 7, hemoglobin 13, and hematocrit 38, platelet  count is 219.  On the chemistry:  Sodium 140, potassium 4, chloride 106,  bicarb 30, glucose 207, and BUN 14 and creatinine is 0.8.  Cardiac  markers CK/MB is 1.9 and 2; and troponin is 0.2 and 0.1.   EKG:  Normal sinus rhythm at 71 per minute, first degree AV block.  Otherwise there are not any significant ST-T changes.   ASSESSMENT:  1. Chest pain.  The patient has  multiple risk factors and will admit      him and will send serial cardiac enzymes and EKG.  We will place      him on aspirin and nitroglycerin, and will do an echo in the      morning.  We will consult cardiology, [facility],      to see the patient for further followup and management.  2. Left lower intermittent pain.  I am not sure if this is      claudication; but I will do an arterial Doppler of the lower      extremity to see if there is any blockage.  We will also do spine      CAT scan to see if there is any compression.  Depending on the      results, our management will also proceed.  3. Diabetes mellitus.  4. Hypertension.  5. Hypothyroidism.  6. Coronary artery disease.  Three-through-six are fairly stable and      we will continue his home medications.      Marcello Moores, MD  Electronically Signed     MT/MEDQ  D:  11/17/2006  T:  11/17/2006  Job:  [id number]

## 2010-07-29 NOTE — Procedures (Signed)
NAME:  Alan Orr, Alan Orr NO.:  [id number]   MEDICAL RECORD NO.:  [id number]          PATIENT TYPE:  INP   LOCATION:  A210                          FACILITY:  [facility]   PHYSICIAN:  Dani Gobble, MD       DATE OF BIRTH:  10/09/1945   DATE OF PROCEDURE:  11/17/2006  DATE OF DISCHARGE:                                ECHOCARDIOGRAM   REFERRING:  [facility] and Dr. Benson Setting as well as Dr. Nanetta Batty.   INDICATIONS:  A 65 year old gentleman with a past medical history of  hypertension and CAD status post CABG who was referred for chest  discomfort.   The aorta measures normally at 3.3 cm.   The left atrium also measures normally at 3.1 cm.  The patient appeared  to be in sinus rhythm.   The interventricular septum and posterior wall are mild to moderately  thickened measured at 1.5 cm and 1.4 cm, respectively.   The aortic valve leaflets are not well visualized; however, the overall  opening appears to be reasonable.  No significant aortic insufficiency  is noted.  Doppler interrogation of the aortic valve is within normal  limits.   The mitral valve also appears grossly structurally normal.  No mitral  valve prolapse is noted.  Trivial mitral regurgitation is noted.  Doppler interrogation of mitral valve is within normal limits.   The pulmonic valve is not visualized.   Tricuspid valve appears grossly structurally normal with mild tricuspid  regurgitation noted.   The left ventricle is normal in size with the LVIDD measured at 4.6 cm  and the LVISD measured at 3.4 cm.  Overall left ventricular systolic  function is mildly diminished with an estimated ejection fraction of 45-  50%.  There is apical and periapical akinesis.  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  No definitive thrombus is noted.  However, the  possibility cannot be excluded on this study.   The right ventricle is mildly dilated.  Overall RV function is difficult  to estimate due to  poor visualization, but grossly it appears to be  reasonably well-preserved or possibly mildly diminished.  The right  atrium is normal in size.   IMPRESSION:  1. Mild to moderate concentric LVH.  2. Trivial mitral regurgitation.  3. Mild tricuspid regurgitation.  4. Normal left ventricular size with diminished ejection fraction      estimated at 45-50% with apical and periapical      akinesis.  There is mild aneurysmal formation at the apex.  No      definitive thrombus is noted, but I cannot exclude the possibility      on this study.  5. The right ventricle is mildly dilated with right ventricular      function, either mildly diminished or low normal.           ______________________________  Dani Gobble, MD     AB/MEDQ  D:  11/17/2006  T:  11/18/2006  Job:  [id number]   cc:   Marcello Moores, MD   Nanetta Batty, M.D.  Fax: [phone]

## 2010-07-29 NOTE — Assessment & Plan Note (Signed)
OFFICE VISIT   NAREK, KNISS  DOB:  1945/06/27                                       12/17/2008  [id number]#:[id number]   REASON FOR VISIT:  Follow-up wound.   HISTORY:  This is a 65 year old gentleman who recently underwent  popliteal embolectomy and patch angioplasty following embolic occlusion  and an ischemic leg.  This was the second event.  He did have brachial  embolectomy by Dr. Arbie Cookey 2 years ago.  This happened in the setting of  subtherapeutic Coumadin levels.  He was discharged home.  He developed a  GI bleed and was readmitted.  There was no etiology found for his bleed.  He was then discharged home.  He did call the home-health nurse.  The  home-health nurse did call.  He was having active bleeding from his  wound.  He was sent to the emergency department.  He was seen by Dr.  Edilia Bo, who opened the wound and packed it and started him on  antibiotics.  He comes in today for follow-up.   The wound is __________ .  The base appears very healthy.  There is  still old clot within the wound. I was able to get some out.  I also  debrided some of the skin.  Overall the wound is very healthy in  appearance.  There is no evidence of infection.   Will need to continue to do twice-daily dressing changes.  I will see  him back in 3 weeks.   Jorge Ny, MD  Electronically Signed   VWB/MEDQ  D:  12/17/2008  T:  12/18/2008  Job:  [id number]

## 2010-07-29 NOTE — Cardiovascular Report (Signed)
NAME:  Alan Orr, HAMMERS NO.:  [id number]   MEDICAL RECORD NO.:  [id number]          PATIENT TYPE:  INP   LOCATION:  3705                         FACILITY:  [facility]   PHYSICIAN:  Madaline Savage, M.D.DATE OF BIRTH:  Dec 26, 1945   DATE OF PROCEDURE:  01/07/2007  DATE OF DISCHARGE:                            CARDIAC CATHETERIZATION   PROCEDURES PERFORMED:  1. Selective coronary angiography by Judkins technique.  2. Retrograde left heart catheterization.  3. Left ventricular angiography.   COMPLICATIONS:  None.   ENTRY SITE:  Right femoral.   DYE USED:  Omnipaque.   PATIENT PROFILE:  Mr. Marszalek is 65 years old.  He has had previous  coronary bypass grafting.  His last cardiac catheterization was done  December 08, 2006 by Dr. Ritta Slot.  The patient is normally seen  by Dr. Allyson Sabal.  The patient entered the hospital with chest pain.  There  was concern that there may have been an interval change in his coronary  anatomy from the time of his catheterization December 08, 2006.  Today's procedure was done on an elective basis inpatient and no  complications occurred.  No interventions were performed.   RESULTS:  Pressures: Left ventricular pressure showed an LV pressure of  150/8, end-diastolic pressure 23, central aortic pressure 150/60, mean  95.  No aortic valve gradient by pullback technique.   Angiographic results are as follows:  Left main coronary artery was a  medium vessel also medium in length tapering distally but without any  significant stenosis.  LAD 100% occluded after a bifurcating large  septal perforator branch.  No antegrade flow into the LAD.  Diagonal  branch of LAD arose proximal to septal perforator branch.  There is a  bypass graft which inserts into this diagonal branch.  I do not see much  disease in this diagonal branch.   The intermediate ramus branch of the left main coronary artery is small.  I do not see any significant  disease there.  Circumflex coronary artery  is not dominant.  There is an obtuse marginal branch.  I am calling it  #1.  It has a very small diameter in the proximal portion of OM #1.  There is a larger distal portion to this vessel which is fed by a patent  saphenous vein graft.  The distal circumflex is small and nondominant.  Right coronary artery is a very large vessel about 4.5 mm in diameter  which is 100 cm occluded.  Near the origin of the pulmonary conus  branch, there is then a skip lesion with flow into the midportion of the  RCA for about 12-15 mm, and then the vessel becomes occluded again.   There is a large, very healthy-appearing, saphenous vein graft going to  the distal RCA which is widely patent throughout with TIMI 3 distal  flow.   There is a saphenous vein graft going to a diagonal branch of the LAD  which appears to be in good condition with very good flow to the lower  anterolateral wall.  There is a saphenous vein graft going to  the  circumflex obtuse marginal branch which is a fairly small vessel but  patent.   There is a patent left internal mammary artery graft to LAD, and the  distal LAD is a fairly small vessel.   Left ventricular angiography shows severe hypokinesis along two-thirds  of the anterior wall and apex.  The inferoapical portion of the left  ventricle also is severely hypokinetic.  There is hyperdynamic motion of  the anterobasal and inferobasal wall segments, and I would estimate  ejection fraction at 30%.  There may possibly be some mural thrombus  distally in the LV apex.  No significant mitral regurgitation was seen.   FINAL IMPRESSIONS:  1. Severe native three-vessel coronary artery disease as described      above.  2. All grafts down to their respective downstream targets are widely      patent, specifically:      a.     Saphenous vein graft to distal right coronary artery patent.      b.     Saphenous vein graft to obtuse marginal  #1 patent.      c.     Patent saphenous vein graft to diagonal.      d.     Patent left internal mammary artery to left anterior       descending artery.  3. 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   PLAN:  The patient will be recovered in the holding area of the  catheterization lab.  Will then go a telemetry bed and will be a  candidate for discharge later today with follow-up with Dr. Allyson Sabal.  Medical therapy of his coronary disease is recommended.           ______________________________  Madaline Savage, M.D.     WHG/MEDQ  D:  01/07/2007  T:  01/08/2007  Job:  [id number]

## 2010-07-29 NOTE — Assessment & Plan Note (Signed)
OFFICE VISIT   Orr, Alan  DOB:  1945-05-24                                       12/24/2006  EAVWU#:[id number]   Patient presents today for followup of his left brachial embolectomy on  12/07/06.  He presented with acute left brachial embolus.  He was taken  to the operating room for left brachial embolectomy.  He had ongoing  workup which revealed no evidence of obvious source for his emboli.   He does have 2+ radial pulse, and his incision is well healed.   He will continue his usual activities without limitation.  He is being  maintained on chronic Coumadin therapy.  We will see him on a p.r.n.  basis.   Larina Earthly, M.D.  Electronically Signed   TFE/MEDQ  D:  12/24/2006  T:  12/28/2006  Job:  [id number]   cc:   Nanetta Batty, M.D.  Madelin Rear. Sherwood Gambler, MD

## 2010-07-29 NOTE — Op Note (Signed)
NAME:  Alan Orr, Alan Orr NO.:  [id number]   MEDICAL RECORD NO.:  [id number]          PATIENT TYPE:  INP   LOCATION:  3734                         FACILITY:  [facility]   PHYSICIAN:  Larina Earthly, M.D.    DATE OF BIRTH:  09/14/1945   DATE OF PROCEDURE:  12/07/2006  DATE OF DISCHARGE:                               OPERATIVE REPORT   PREOPERATIVE DIAGNOSIS:  Left brachial embolus.   POSTOPERATIVE DIAGNOSIS:  Left brachial embolus.   PROCEDURES:  Left brachial embolectomy.   SURGEON:  Larina Earthly, M.D.   ASSISTANT:  Nurse.   ANESTHESIA:  MAC.   COMPLICATIONS:  None.   DISPOSITION:  To PACU stable.   PROCEDURE IN DETAIL:  The patient was taken to the operating room,  placed in supine position where the area of left arm and left axilla  prepped and draped usual sterile fashion.  Using local anesthesia  incision was made over the above elbow brachial artery.  The artery had  no pulse.  The artery was encircled with vessel loop, was occluded  proximal and distally and was opened transversely with an 11 blade.  A 4  Fogarty catheter was passed centrally and clot was removed with  excellent inflow.  Two additional negative passes were undertaken.  There was excellent flow to this level.  This was reoccluded.  It should  be mentioned the patient was given 7000 units of intravenous heparin  prior to opening the artery.  There was moderate back bleeding from the  artery and a Fogarty catheter was passed down to the level of the wrist  and no thrombus was removed distally.  The incision in the artery was  closed with interrupted 6-0 Prolene sutures.  Clamps were removed and  good Doppler flow was noted at the wrist.  The wounds were irrigated  with saline.  The protamine was not reversed.  The wounds were closed  with 3-0 Vicryl in subcutaneous and subcuticular tissue.  Benzoin and  Steri-Strips were applied.      Larina Earthly, M.D.  Electronically Signed    TFE/MEDQ  D:  12/07/2006  T:  12/08/2006  Job:  [id number]   cc:   Antonieta Iba, MD  Nanetta Batty, M.D.  Madelin Rear. Sherwood Gambler, MD

## 2010-07-29 NOTE — Discharge Summary (Signed)
NAME:  Alan Orr NO.:  [id number]   MEDICAL RECORD NO.:  [id number]          PATIENT TYPE:  INP   LOCATION:  3734                         FACILITY:  [facility]   PHYSICIAN:  Nanetta Batty, M.D.   DATE OF BIRTH:  [birth year]   DATE OF ADMISSION:  12/07/2006  DATE OF DISCHARGE:  12/10/2006                               DISCHARGE SUMMARY   [name] is a 65 year old white male patient with a history of  coronary artery disease.  He is status post CABG in 1997 with a LIMA to  his LAD and SVG to his PDA and PLA and SVG to his distal OM1, SVG to his  OM-2.  His last cath was March 2004.  He had patent grafts at that time.  He had a Myoview October 2007, that showed an infarct and scar but no  evidence of inducible ischemia.  An echo in September 2008, showed mild  to moderate concentric LVH.  His EF was 45-50%.  He went to the  emergency room today from his primary care physician's office after an  onset of left arm numbness and then chest pain while driving.  It was  rated as a 9/10.  The arm discomfort was worse than the chest  discomfort.  A CT scan was done of his chest and arm and it showed  approximately 8.5 cm occlusion of his axillary to brachial cephalic  artery.  Vascular surgery was called.  He was seen by Dr. Arbie Cookey and he  was taken to the OR for an embolectomy.  The following day it was noted  that his CK-MB and troponins were positive, thus he was taken to the  cath lab.  He had patent coronary arteries.  He did have a distal LAD  after the graft.  The anastomosis was occluded, however, the vessel was  too small to intervene, thus he will be treated medically.  He also had  a TEE to see if he had an LV thrombus as a possibility of where his  embolus came from.  However, the TEE was negative for any thrombus.  A  hematology consult was then called and hypercoagulable labs were drawn  and hematology stated that he could follow-up as an outpatient in 3  weeks.  The patient was placed on Lovenox to Coumadin.  He was taught  how to give himself injections and on December 10, 2006, he was  considered stable for discharge home.  His  blood pressure was 129/68,  temperature was 97.9, heart rate was 78, respirations were 20.   LABORATORY DATA:  Hemoglobin 11.8, hematocrit 35.1, wbc 6.6 and  platelets 188.  D-dimer was less than 0.22.  Lupus anticoagulant was not  detected.  Protein C was 108, protein S was 186, protein S functional  was 137, protein C functional was 169.  Sodium was 138, potassium 3.7,  BUN 6, creatinine 0.81, calcium was 8.4, glucose was 117, total protein  was 5.6, albumin 3.2. Hemoglobin A1c was 8.6.  CK-MB #1 was 90/3.1,  troponin 0.03; #2 was 286/49, troponin of 2.86; #3 was 508/84,  troponin  of 8.56; #4 was 593/81, troponin of 9.80; #5 was 498/36.4, troponin of  7.56.  Total cholesterol is 150, triglycerides 229, HDL 29, LDL was 75,  VLDL was 46.  TSH was 0.354.  Anticardiolipin IgG was 7.  Prothrombin  gene mutation was negative.  Beta II glycoprotein antibodies were all  normal.   CT of his neck with contrast showed arterial occlusion of the left  axillary brachial junction extending 8 cm distally with patent distal  brachial artery.  He had lower extremity Dopplers that showed no DVT.  I  do not see a chest x-ray in the chart.  TEE showed left ventricular apex  aneurysmal.  EF was 45%, range being 40-45%.  He had a normal left  atrium.  No thrombus identified.  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   DISCHARGE MEDICATIONS:  1. Zetia 10 mg a day.  2. Lipitor 40 mg every day.  3. Lanoxin 250 mcg a day.  4. Niaspan 1000 mg a day at bedtime.  5. Synthroid 112 mcg every day.  6. Ramipril 10 mg a day.  7. Metoprolol 50 mg twice a day.  8. Metformin 500 mg twice a day.  9. Prevacid 40 mg a day.  10.Aspirin 81 mg.  11.Hydrochlorothiazide 12.5 mg every day.  12.Neurontin 100 mg a  day.  13.Warfarin 5 mg tablets, he will take two on the night of discharge,      two again on Saturday and 5 mg on Sunday and he should check his      INR on Monday.  14.He will be on Lovenox 110 mg subcu every 12 hours.   DISCHARGE DIAGNOSES:  1. Left brachial embolus status post left brachial embolectomy on      December 07, 2006.  2. Acute myocardial infarction with a totaled distal left anterior      descending and it was a small vessel, no intervention was      performed.  3. Ejection fraction of 40%.  4. Known coronary disease with prior history of coronary bypass      grafting in 1997.  5. Dyslipidemia.  6. Hypothyroidism.  7. Hypertension.  8. Non-insulin-dependent diabetes mellitus not controlled with a      hemoglobin A1c in the 8 range  9. Anticoagulation Lovenox to Coumadin as an outpatient.      Lezlie Octave, N.P.      Nanetta Batty, M.D.  Electronically Signed    BB/MEDQ  D:  01/26/2007  T:  01/27/2007  Job:  [id number]   cc:   Madelin Rear. Sherwood Gambler, MD  Hematology Service  Larina Earthly, M.D.

## 2010-07-29 NOTE — Assessment & Plan Note (Signed)
OFFICE VISIT   ARTHA, CHIASSON  DOB:  08-07-1945                                       01/07/2009  YQIHK#:[id number]   REASON FOR VISIT:  Follow up wound.   HISTORY:  This is a 65 year old gentleman who underwent popliteal and  tibial embolectomy with patch angioplasty following embolic occlusion  and an ischemic leg.  He had a brachial embolectomy performed by Dr.  Arbie Cookey 2 years ago.  This all occurred in the setting of subtherapeutic  Coumadin levels.  He was ultimately discharged home.  Unfortunately he  developed a GI bleed and was readmitted.  He did develop a wound  separation.  The wound had to be evacuated in the emergency department.  We have been packing it.   It is a very healthy-appearing wound.  He was changed to some form of a  silver dressing approximately a week ago.  The tissue looks like it is  healing over with some gelatinous tissue over the top.  I am not happy  with the way the wound looks, although there is no evidence of  infection.   I think we should go back to wet-to-dry dressing changes.  I reiterated  this to the patient.  He can see me back in 3 weeks.   Jorge Ny, MD  Electronically Signed   VWB/MEDQ  D:  01/07/2009  T:  01/08/2009  Job:  [id number]

## 2010-07-29 NOTE — Consult Note (Signed)
NAME:  Alan, Orr NO.:  [id number]   MEDICAL RECORD NO.:  [id number]          PATIENT TYPE:  INP   LOCATION:  3734                         FACILITY:  [facility]   PHYSICIAN:  Leighton Roach. Truett Perna, M.D. DATE OF BIRTH:  04/17/1945   DATE OF CONSULTATION:  12/10/2006  DATE OF DISCHARGE:                                 CONSULTATION   PATIENT IDENTIFICATION:  Alan Orr is a 65 year old admitted with a  left brachial artery embolus.  He is referred for hematology  consultation to consider a hypercoagulable state.   HISTORY OF PRESENT ILLNESS:  Alan Orr is a 65 year old with a  history of coronary artery disease.  He underwent coronary artery bypass  surgery in 1997.   He developed the acute onset of left arm pain with associated chest  pressure and shortness of breath on December 07, 2006.  He presented to  the [facility] Emergency Room for evaluation.   He was found to have an arterial occlusion of the left axillary/brachial  artery.  He was referred to Dr. Arbie Cookey and underwent an embolectomy  procedure on December 07, 2006.  The pain has resolved.   He underwent a cardiac catheterization on December 08, 2006 after he  developed chest pain.  This procedure confirmed a new occlusion of the  left anterior descending artery.  There was suspicion for a left  ventricular apical thrombus and left ventricular aneurysm.   He underwent a transesophageal echocardiogram on December 09, 2006.  A  left ventricular apex aneurysm was noted.  The left ventricular ejection  fraction was measured at 45%.  No thrombus was seen.   Alan Orr reports no previous history of venous or arterial  thromboembolic disease.   PAST MEDICAL HISTORY:  1. Coronary artery bypass surgery in 1997.  2. Diabetes mellitus.  3. Hypertension.  4. Hypercholesterolemia.  5. Hypothyroidism.  6. Gastroesophageal reflux disease.  7. Osteoarthritis.  8. Nephrolithiasis.  9. Back surgery.   MEDICATIONS ON ADMISSION:  1. Metoprolol 50 mg, 1/2 b.i.d.  2. Aspirin 325 mg daily.  3. Lanoxin 0.25 mg daily.  4. Synthroid 112 mcg daily.  5. Altace 5 mg daily.  6. Lipitor 40 mg daily.  7. Niaspan 1000 mg at bedtime.  8. Prevacid 30 mg daily.  9. Zetia 10 mg daily.  10.Metformin 500 mg b.i.d.   ALLERGIES:  No known drug allergies.   FAMILY HISTORY:  No family history of venous or arterial thromboembolic  disease.   SOCIAL HISTORY:  He lives in [place].  He is a retired Surveyor, minerals.  He quit smoking cigarettes in 1997.  He does not use alcohol.  He denies  risk factors for HIV and hepatitis.   REVIEW OF SYSTEMS:  CONSTITUTIONAL:  Negative.  CARDIAC:  As per HPI.  GU:  Negative.  GI:  He had an episode of diarrhea on December 06, 2006.  NEUROLOGIC:  Negative.  SKIN:  Negative.  HEMATOLOGIC:  Negative.   PHYSICAL EXAMINATION:  VITAL SIGNS:  Blood pressure 116/56, pulse 60,  temperature 98.5, oxygen saturation 96% on room air.  HEENT:  Neck  without mass.  LUNGS:  Decreased breath sounds, with end-inspiratory rales at the left  lower chest.  No respiratory distress.  CARDIAC:  Distant heart sounds.  Regular rhythm.  ABDOMEN:  No hepatosplenomegaly.  LYMPH NODES:  No palpable cervical, clavicular, or inguinal nodes.  Prominent bilateral axillary fat pads versus soft 1-2 cm mobile lymph  nodes.  GU:  Uncircumcised male.  Testes without mass.  EXTREMITIES:  No edema.  The left radial and bilateral dorsal pedis  pulses are intact.   LABORATORIES:  From December 07, 2006, hemoglobin 15.6, hematocrit 46%.  CBC on December 07, 2006:  Hemoglobin 12.7, platelets 218,000, white  count 10.3, ANC 6.8, hematocrit 36.7%.  BUN 10, creatinine 1.04, total  protein 5.6, albumin 3.2.   IMPRESSION:  1. Left brachial artery embolus, status post an embolectomy on      December 07, 2006.  2. Left anterior descending occlusion, question secondary to an      embolus.  3. Left apical  aneurysm.  4. History of coronary artery disease, status post coronary artery      bypass surgery.  5. Diabetes mellitus.  6. Nephrolithiasis.  7. Hypertension.  8. Hypercholesterolemia.  9. Mild anemia on hospital admission - question baseline.   [name] was admitted with a left brachial artery embolus and  probable LAD embolus.  No clot was found at the time of a  transesophageal echocardiogram.  I suspect he had an embolic event from  the heart or aorta.  I have a low suspicion for a primary  hypercoagulation syndrome.  There is no clinical evidence for a  malignancy.   The mild anemia on hospital admission is likely a benign finding.  The  hemoglobin was in the low normal range on November 16, 2006 and  November 18, 2006.   RECOMMENDATIONS:  1. Anticoagulation therapy per the recommendations of vascular surgery      and cardiology.  2. Laboratory evaluation for the antiphospholipid syndrome, protein C,      protein S, and antithrombin III levels.  3. We will schedule outpatient hematology followup to include a repeat      CBC in 3-4 weeks.      Leighton Roach Truett Perna, M.D.  Electronically Signed     GBS/MEDQ  D:  12/10/2006  T:  12/10/2006  Job:  [id number]   cc:   Ritta Slot, MD  Larina Earthly, M.D.  Madelin Rear. Sherwood Gambler, MD

## 2010-07-29 NOTE — Discharge Summary (Signed)
NAME:  Alan Orr, Alan Orr NO.:  [id number]   MEDICAL RECORD NO.:  [id number]          PATIENT TYPE:  INP   LOCATION:  A210                          FACILITY:  [facility]   PHYSICIAN:  Skeet Latch, DO    DATE OF BIRTH:  10-20-1945   DATE OF ADMISSION:  11/16/2006  DATE OF DISCHARGE:  09/04/2008LH                               DISCHARGE SUMMARY   DISCHARGE DIAGNOSES:  1. Chest pain.  2. Claudication.  3. Type 2 diabetes mellitus.  4. Hypertension.  5. Hypothyroidism.  6. Coronary artery disease.   BRIEF HOSPITAL COURSE:  Alan Orr is a 65 year old Caucasian male who  presented with chest pain and some left lower leg pain.  The patient has  a history of coronary artery disease status post CABG, history of  diabetes, hypertension, hypothyroidism.  The patient presented with  chest discomfort that was intermediate that he rated a 4-6/10 that  lasted for a few days.  The day previous to admission he states this  chest discomfort became more severe and he has also is also having lower  left leg pain.  The patient described his leg pain as achy in nature and  associated at rest.  The patient states that while walking the pain  seems to improve.  Secondary to the patient's past medical history, it  is decided that the patient needs be admitted for evaluation.  The  patient was admitted for chest pain and claudication.  The patient did  have a chest x-ray performed, which was negative.  The patient's EKG  showed sinus rhythm with first-degree AV block with anterior infarct  that seems to be old.  The patient also had an echocardiogram performed  which showed mild to moderate LVH, trivial mitral regurgitation and mild  tricuspid regurgitation and a normal left ventricular size with  diminished ejection fraction of approximately 45-50% with apical and  periapical akinesis.  Cardiology was consulted and saw the patient and  decided there was no further chest pain workup that  needed to be done as  an inpatient.  The patient does have a cardiologist, Dr. Allyson Sabal, and it  was recommended that he see him as an outpatient the next 1-2 weeks and  decide if there needs to be any outpatient workup.  The patient also had  arterial Dopplers on his legs performed which show:  (1) No significant  arterial occlusive disease at rest; (2) showed a right ABI of 1.3; (3) a  left ABI of 1.1.  At this time the patient is chest pain-free and states  that his left lower extremity pain still present but greatly diminished  at this time.  I feel the patient is stable enough to be discharged at  this time.   LABS ON DISCHARGE:  Sodium 140, potassium 3.8, chloride 108, CO2 28,  glucose 145, BUN 11, creatinine 0.79.  White count 7.1, hemoglobin 13.1,  hematocrit 38.5, platelet count 194.  The patient had a negative set of  troponins.  D-dimer was 0.28.   VITALS ON DISCHARGE:  Temperature is 97.9, pulse 54, respirations 18,  blood  pressure 122/66.  The patient is saturating 94% on room air.   MEDICATIONS ON DISCHARGE:  1. Metformin.  The patient is to continue his previous dose.  2. Aspirin 325 mg daily.  3. Zetia 10 mg daily.  4. NitroQuick as needed.  5. Metoprolol 50 mg 1/2 tablet twice a day.  6. Lanoxin 250 mcg daily.  7. Altace 10 mg daily.  8. Lipitor 40 mg at bedtime.  9. Prevacid 30 mg daily.  10.Synthroid 112 mcg daily.  11.Avandia 4 mg daily.  12.Tylox 5/500 mg as needed.   CONDITION ON DISCHARGE:  Stable.   DISPOSITION:  The patient will be discharged to home.   DIET:  The patient is to do a low-sodium, heart-healthy diet.   ACTIVITY:  The patient is to increase his activity slowly.   INSTRUCTIONS:  The patient is to follow up with Dr. Sherwood Gambler in  approximately 1 week and also follow-up with Dr. Allyson Sabal in 1-2 weeks for  follow-up.  The patient instructed to return to the emergency room if  any persistent or recurrent chest pain.      Skeet Latch, DO   Electronically Signed     SM/MEDQ  D:  11/18/2006  T:  11/18/2006  Job:  [id number]   cc:   Madelin Rear. Sherwood Gambler, MD  Fax: [phone]   Nanetta Batty, M.D.  Fax: [phone]

## 2010-07-29 NOTE — Discharge Summary (Signed)
NAME:  Alan Orr, TOMB NO.:  [id number]   MEDICAL RECORD NO.:  [id number]          PATIENT TYPE:  INP   LOCATION:  3705                         FACILITY:  [facility]   PHYSICIAN:  Nanetta Batty, M.D.   DATE OF BIRTH:  1945/10/20   DATE OF ADMISSION:  01/05/2007  DATE OF DISCHARGE:  01/12/2007                               DISCHARGE SUMMARY   DISCHARGE DIAGNOSES:  1. Coronary disease, coronary artery bypass grafting in 1997 with      catheterizations recently in September of 2008 and again in January 07, 2007.  Plan is for continued medical therapy.  2. Recent left brachial thrombus after catheterization in September of      2008.  3. 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  4. Coumadin therapy.  5. Non-insulin-dependent diabetes.  6. Treated hypertension.  7. Treated dyslipidemia.  8. Treated hypothyroidism.   HOSPITAL COURSE:  The patient is a 65 year old male followed by Dr.  Allyson Sabal and Dr. Nobie Putnam.  He was admitted with unstable angina on January 05, 2007.  He had had a bypass in the past and recently was cathed.  He  was treated medically after his cath in September.  He developed a left  brachial thrombus and underwent left brachial embolectomy on December 07, 2006.  He was admitted January 05, 2007 as noted above with chest  pain consistent with unstable angina.  His INR was 2.2 on admission, as  he was on Coumadin prior to admission.  It was felt he needed another  catheterization.  His enzymes were negative.  Catheterization was done  January 07, 2007 by Dr. Elsie Lincoln.  The SVG to the RCA was patent.  The SVG  to the OM was patent.  The SVG to the diagonal was patent.  The LIMA to  the LAD was patent.  The diagonal was occluded distally.  Plan was for  continued medical therapy.  EF was 30% with an akinetic apex.  In the  holding area, the patient developed severe right arm pain after she  cath.  Dr. Elsie Lincoln felt his right radial  pulse was less than the radial  pulse on the left.  Dr. Myra Gianotti from the vascular surgical group came to  see the patient in consult.  A duplex evaluation was performed which  revealed triphasic signals in the subclavian, axillary, brachial artery  and biphasic signals in the radial and ulnar arteries.  Dr. Myra Gianotti felt  based on the study that the actual signals on the right arm which was  symptomatic were better than those on the left arm which was  asymptomatic.  The patient was put back on heparin postprocedure and  Coumadin was started.  He did have some problems with some groin  bleeding on January 07, 2007, but this was controlled with pressure.  The remainder of his hospital course was heparin to Coumadin.  After  being rounded on by Dr. Clarene Duke on January 12, 2007, Dr. Clarene Duke feels he  can go home on Lovenox to Coumadin crossover.  His INR is 1.2 at  discharge.  His INR goal will be 2.5.  He will be discharged on 15 mg of  Coumadin for the next 2 days and then have an INR check on Friday  morning.  He will be sent home on Lovenox 100 mg twice a day.   OTHER MEDICATIONS AT DISCHARGE:  1. Zetia 10 mg a day.  2. Lipitor 40 mg a day.  3. Ramipril 10 mg a day.  4. Metoprolol 25 mg twice a day.  5. HCTZ 12.5 mg a day.  6. Nitroglycerin sublingual p.r.n.  7. Niaspan 1 gram h.s.  8. Gabapentin 100 mg three times a day.  9. Metformin 500 mg twice a day.  10.Synthroid 0.112 mg a day.  11.Lanoxin 0.25 mg a day.  12.Avandia 4 mg a day.   LABORATORIES AT DISCHARGE:  White count is 10.2, hemoglobin 12.6,  hematocrit 37.3, platelets 279.  INR is 1.2.  He has had several days of  15 mg.  Renal function shows sodium 138, potassium 3.8, BUN 9,  creatinine 0.8.  CK-MB and troponin are negative.  Chest x-ray shows no  acute disease.  Telemetry shows sinus rhythm, sinus bradycardia.   DISPOSITION:  The patient is discharged in stable condition and will  follow up with Dr. Allyson Sabal as an  outpatient.      Abelino Derrick, P.A.      Nanetta Batty, M.D.  Electronically Signed    LKK/MEDQ  D:  01/12/2007  T:  01/12/2007  Job:  [id number]   cc:   Patrica Duel, M.D.  Jorge Ny, MD

## 2010-08-01 NOTE — Cardiovascular Report (Signed)
[place]  Patient:    Alan Orr, Alan Orr                     MRN: [id number] Proc. Date: 09/30/99 Adm. Date:  [id number] Attending:  Berry, Jonathan [place] CC:         Second floor [facility]             [facility], [place] [address] [place] [place]             Dr. Peyton Najjar ____________                        Cardiac Catheterization  INDICATIONS:  Mr. [name] is a 65 year old married white male with history of CAD and moderate LV dysfunction, status post CABG x 19 December 1995 with LIMA to LAD, vein graft to intermediate branch, obtuse marginal branch and sequential PDA and PLA.  He had been doing well until recent last several days when he developed some chest discomfort with shortness of breath.  He was admitted on July 16 and ruled out for myocardial infarction.  He was placed on IV heparin and nitroglycerin and presents now for diagnostic coronary angiography.  PROCEDURE:  Patient was brought to the second floor [facility] in the postabsorptive state.  He was premedicated with p.o. Valium.  His right was prepped and shaved in the usual sterile fashion.  Xylocaine 1% was used for local anesthesia.  A 6-French sheath was inserted into the right femoral artery using standard Seldinger technique.  A 6-French right and left Judkins diagnostic catheter along with a 6-French pigtail catheter were used for selective coronary angiography, left ventriculography, selective vein graft angiography, selective IMA angiography and distal abdominal aortography.  Omnipaque dye was used for the entirety of the case.  Retrograde aortic, ventricular and pullback pressures were recorded.  HEMODYNAMICS: 1. Aortic systolic pressure 125, diastolic pressure 67. 2. Left ventricular systolic pressure 125, end-diastolic pressure of 14.  SELECTIVE CORONARY ANGIOGRAPHY: 1. Left main: normal. 2. LAD: LAD  had 70-80% stenosis after a small first diagonal branch and was    occluded in its mid portion. 3. Ramus intermedius branch:  Arose high in the LAD and appeared free of    significant disease. 4. Left circumflex:  This vessel appeared free of significant disease. 5. Right coronary artery: This vessel was segmentally and diffusely diseased    in its mid portion and occluded at the genu. 6. Vein graft to the PDA and PLA sequentially:  Widely patent. 7. Vein graft to the second circumflex marginal branch:  Widely patent. 8. Vein graft to the intermediate branch:  Widely patent. 9. LIMA to the LAD:  Widely patent.  LEFT VENTRICULOGRAPHY:  RAO left ventriculogram was performed using 25 cc of Omnipaque dye at 12 cc/second.  The overall LVEF was estimated at approximately 45-50% with severe anteroapical, inferoapical hypokinesia and akinesia.  DISTAL ABDOMINAL AORTOGRAPHY:  This abdominal aortogram was performed using 30 cc of Omnipaque dye at 20 cc/second.  The renal arteries were widely patent.  The infrarenal, abdominal aorta and iliac bifurcations appeared free significant atherosclerotic changes.  IMPRESSION:  Mr. [name] has widely patent grafts with LV has not changed. There were no areas that were not vascularized and with the territories of ischemia.  I am unclear of the etiology of his chest pain and shortness of breath.  Continue medical therapy will  be recommended.  ______ was measured and sheaths removed.  Pressure was applied to the groin to achieve hemostasis.  The patient left the laboratory in stable condition. He will be discharged home tomorrow and will be seen back in the office in follow-up. DD:  09/30/99 TD:  10/01/99 Job: [id number]

## 2010-08-01 NOTE — Cardiovascular Report (Signed)
NAME:  Alan Orr, Alan Orr NO.:  [id number]   MEDICAL RECORD NO.:  [id number]                   PATIENT TYPE:  INP   LOCATION:  1828                                 FACILITY:  [facility]   PHYSICIAN:  Nanetta Batty, M.D.                DATE OF BIRTH:  [birth year]   DATE OF PROCEDURE:  05/31/2002  DATE OF DISCHARGE:                              CARDIAC CATHETERIZATION   PROCEDURE:  Cardiac catheterization.   CARDIOLOGIST:  Nanetta Batty, M.D.   INDICATIONS:  The patient is a 65 year old married white male with a history  of CABG x5 by Dr. Sheliah Plane in 1997.  He was catheterized in 2001 and  found to have patent grafts.  He developed substernal chest pain into both  arms associated with shortness of breath this morning at 9 a.m. and came to  [facility] ER where he was treated with one sublingual nitroglycerin, and  his pain subsided.  He had no EKG changes.  He was heparinized and put on IV  nitroglycerin.  He was thus scheduled for diagnostic coronary arteriography.   DESCRIPTION OF PROCEDURE:  The patient was brought to the second floor  cardiac catheterization lab in the postabsorptive state.  He was  premedicated with p.o. Valium.  His right groin was prepped and shaved in  the usual sterile fashion.  Then 1% Xylocaine was used for local anesthesia.  A 6 French sheath was inserted into the right femoral artery using standard  Seldinger technique.  A 6 French right and left Judkins diagnostic catheter  as well as a 6 French pigtail catheter were used for selective coronary  angiography, left ventriculography, subselective vein graft angiography,  selective IMA angiography, and distal abdominal aortography.  Omnipaque was  used for the entirety of the case.  Retrograde, aortic, left ventricular and  pullback pressures were recorded.  When the ACT was measured, the sheaths  were removed.  Pressure was held in the groin to achieve hemostasis.  The  patient left the lab in stable condition.   HEMODYNAMICS:  1. Aortic systolic pressure was 139/39.  2. Left ventricular systolic pressure 140 and end-diastolic pressure 21.   SELECTIVE CORONARY ANGIOGRAPHY:  1. Left main:  Normal.  2. Left anterior descending:  The LAD was occluded at its junction with the     proximal and mid third.  3. Ramus intermediate branch:  Moderate in size and widely patent.  4. Circumflex:  Widely patent down to the distal marginal branch which was     occluded.  5. Right coronary artery:  Dominant and occluded proximally.  6. Vein graft to the PDA and PLA sequentially:  Widely patent.  7. Vein graft to the distal obtuse marginal:  Widely patent.  8. Vein graft to the OD:  Widely patent.  9. Left internal mammary artery to the LAD:  Widely patent.   LEFT VENTRICULOGRAPHY:  RAO left  ventriculogram was performed using 25 mL of  Ominipaque dye at 12 mL per second.  The overall LVEF is estimated at  approximately 45% with anteroapical and inferoapical akinesia.   DISTAL ABDOMINAL AORTOGRAPHY:  Distal abdominal aortogram was performed  using 25 cc of Omnipaque dye, 20 cc per second.  The renal arteries were  widely patent.  The renal abdominal aorta and iliac bifurcation appear free  of significant atherosclerotic changes.   IMPRESSION:  The patient's grafts were widely patent.  Anatomy is  essentially unchanged from the last catheterization three years ago.  I  suspect that his chest pain was noncardiac.   PLAN:  Cardiac enzymes will be obtained.  The patient will be placed  empirically on a proton pump inhibitor for anti-reflux measures.  He will be  discharged home in the morning if he has no return of chest pain.                                               Nanetta Batty, M.D.    JB/MEDQ  D:  05/31/2002  T:  06/02/2002  Job:  [id number]   cc:   Cardiac Catheterization Lab   [facility]   Madelin Rear. Sherwood Gambler, M.D.  [address]  [place]  [place] [address]  Fax: [phone]

## 2010-08-01 NOTE — Op Note (Signed)
NAME:  Alan Orr, Alan Orr NO.:  [id number]   MEDICAL RECORD NO.:  [id number]          PATIENT TYPE:  INP   LOCATION:  A310                          FACILITY:  [facility]   PHYSICIAN:  Ky Barban, M.D.DATE OF BIRTH:  07-May-1945   DATE OF PROCEDURE:  10/17/2004  DATE OF DISCHARGE:                                 OPERATIVE REPORT   PREOPERATIVE DIAGNOSIS:  Left ureteral calculus.   POSTOPERATIVE DIAGNOSES:  1.  Left ureteral calculus.  2.  Urethral stricture.   PROCEDURE:  Cystoscopy dilation, left retrograde pyelogram, ureteroscopic  stone basket, holmium laser lithotripsy, insertion of double J stent, no  string attached.   ANESTHESIA:  General endotracheal.   PROCEDURE:  The patient was given general endotracheal, placed in lithotomy  position, usual prep and drape. Tried to introduce a #24 cystoscope, but in  the mid urethra, there is obstruction. It was a stricture, so it was dilated  with Sissy Hoff sounds to a 24-French. Cystoscope was introduced. Left  ureteral orifice catheterized with wedge catheter. Hypaque was injected.  Under fluoroscopic control, there is obstruction, and the ureter above that  is dilated at the uterovesical junction. A guidewire was now passed up into  the renal pelvis, and cystoscope was then removed. The ureteroscope will not  go through the orifice of the ureter, so it was dilated to 15 [place]. Now,  the ureteroscope is introduced along side the guidewire, went to the level  of the stone, and direct vision, a stone basket was passed, and the stone is  engaged in the basket. The stone is partially broken with laser and removed.  After removing the stone, the ureter was inspected and looks normal.  Cystoscope was reintroduced over the guidewire, and a double J  stent was introduced under fluoroscopic control after removing the string. A  nice loop in the renal pelvis and the bladder is obtained after removing the  guidewire. All of  the instruments are removed. The patient left the  operating room in satisfactory condition.       MIJ/MEDQ  D:  10/17/2004  T:  10/17/2004  Job:  [id number]   cc:   Madelin Rear. Sherwood Gambler, MD  [address]  [place]  [place] [address]  Fax: [phone]

## 2010-08-01 NOTE — Consult Note (Signed)
NAME:  Alan Orr, Alan Orr NO.:  [id number]   MEDICAL RECORD NO.:  [id number]          PATIENT TYPE:  INP   LOCATION:  A310                          FACILITY:  [facility]   PHYSICIAN:  Ky Barban, M.D.DATE OF BIRTH:  01-01-1946   DATE OF CONSULTATION:  10/12/2004  DATE OF DISCHARGE:                                   CONSULTATION   CHIEF COMPLAINT:  Recurrent left renal colic.   HISTORY:  A 65 year old gentleman for the last several days is having  nagging pain in his left flank.  The pain got really bad this Saturday so he  came to the emergency room.  CT scan shows 5 mm stone in the left mid ureter  causing some hydronephrosis and small bilateral renal calculi.  The patient  is admitted for further management and control of this pain.  He is  complaining of severe dysuria.  Denied any vomiting, fever, chills, or  rigors.   PAST MEDICAL HISTORY:  1.  He has had open-heart surgery.  2.  Hyperlipidemia.  3.  Hypothyroidism.  4.  Hypertension.  5.  Type 2 diabetes.  6.  Arthritis.  7.  Gastroesophageal reflux.  8.  Also gives history of passing kidney stone in the past.   PERSONAL HISTORY:  He does not smoke and does not drink.   REVIEW OF SYSTEMS:  Unremarkable.   PHYSICAL EXAMINATION:  GENERAL:  Moderately built.  Not in acute distress.  Fully conscious.  Alert, oriented.  ABDOMEN:  Soft, flat.  Liver, spleen, kidneys are not palpable.  GENITALIA:  External genitalia is unremarkable.  RECTAL:  Deferred.  EXTREMITIES:  Normal.   LABORATORY DATA:  WBC 8.4, hematocrit 37.8. Sodium 134, potassium 4.4,  chloride 106, CO2 27, glucose 232.  KUB does not show the calculus but CT  scan shows 5 mm stone in the left mid ureter.  Small __________ renal  calculi.   IMPRESSION:  Left ureteral calculus.   RECOMMENDATIONS:  Recommend observation.  I told them I will see if the pain  goes away.  I might send him home.  If the pain continues, consider removing  it  with a basket, but I will discuss with them before we decide to do that.  I am going to add Flomax and continue same IV fluids.   I appreciate Dr. Sherwood Gambler for letting me see this patient.       MIJ/MEDQ  D:  10/12/2004  T:  10/13/2004  Job:  [id number]   cc:   Madelin Rear. Sherwood Gambler, MD  [address]  [place]  [place] [address]  Fax: [phone]

## 2010-08-01 NOTE — Discharge Summary (Signed)
NAME:  Alan Orr, Alan Orr NO.:  [id number]   MEDICAL RECORD NO.:  [id number]          PATIENT TYPE:  INP   LOCATION:  A310                          FACILITY:  [facility]   PHYSICIAN:  Madelin Rear. Sherwood Gambler, MD  DATE OF BIRTH:  June 28, 1945   DATE OF ADMISSION:  10/10/2004  DATE OF DISCHARGE:  08/05/2006LH                                 DISCHARGE SUMMARY   DISCHARGE DIAGNOSIS:  1.  Calculus of ureter.  2.  Hydronephrosis with obstruction.  3.  Urethral stricture.  4.  Hypertension.  5.  Hypothyroidism.  6.  Coronary artery disease status post coronary bypass grafting.  7.  Diabetes mellitus type 2.  8.  Hyperlipidemia.  9.  Gastroesophageal reflux disease.   PROCEDURES IN HOUSE:  Removal of ureter obstruction.  Urethral dilatation.  Retrograde pyelogram.  Ureteral catheterizations.   DISCHARGE MEDICATIONS:  1.  Lanoxin 0.25 mg p.o. daily.  2.  Aspirin 325 mg p.o. daily.  3.  Lopressor 50 mg, 1/2 p.o. daily.  4.  Synthroid 112 mcg daily.  5.  Lipitor 40 mg daily.  6.  Altace 5 mg p.o. daily.  7.  Prevacid 30 mg p.o. daily.  8.  Zetia 10 mg p.o. daily.   SUMMARY:  The patient was admitted with left flank pain that was severe,  sudden onset and intractable.  He had previous ureteroliths but had no  infection/constitutional symptomatology.  He was admitted for further  workup.  CT stone protocol revealed ureteral obstruction.  He was seen in  consultation subsequently after failing to pass it by urology.  Basket  retrieval was accomplished and the patient was discharged per urology for  follow up with them.      Madelin Rear. Sherwood Gambler, MD  Electronically Signed    LJF/MEDQ  D:  11/16/2004  T:  11/16/2004  Job:  [id number]

## 2010-08-01 NOTE — Discharge Summary (Signed)
[place]  Patient:    Alan Orr, Alan Orr                     MRN: [id number] Adm. Date:  [id number] Disc. Date: [id number] Attending:  Berry, Jonathan [place] Dictator:   Marya Fossa, P.A. CC:         Runell Gess, M.D.             Peyton Najjar _____, M.D.                           Discharge Summary  DATE OF BIRTH:  1945-07-05  ADMISSION DIAGNOSES: 1. Unstable angina, rule out myocardial infarction. 2. Coronary artery disease, status post coronary artery bypass graft with    left ventricular dysfunction. 3. Hyperlipidemia.  DISCHARGE DIAGNOSES: 1. Chest pain, resolved, myocardial infarction ruled out with negative    enzymes, cardiac catheterization revealing widely patent    grafts--nonischemic chest pain. 2. Coronary artery disease, status post coronary artery bypass graft with    left ventricular dysfunction. 3. Hyperlipidemia.  HISTORY OF PRESENT ILLNESS:  [name] is a 65 year old single white male with a history of CABG x 5 vessel in 1997 by Dr. Tyrone Sage.  He did well until this past Saturday when he began having episodes of chest tightness associated with shortness of breath when he exerted himself.  He has had three to four episodes since Saturday.  On the morning of admission he was seen at the table and had developed discomfort which was relieved with aspirin.  Simply walking across the room causes him to develop chest tightness and shortness of breath. He had not had any nitroglycerin prescriptions and therefore has not taken any.  He does present to [facility] with complaints of chest pain and ECG shows normal sinus rhythm with old Q waves in V1 through V3.  The patient will be admitted, placed on IV heparin, IV nitroglycerin, aspirin, Plavix and we will plan cardiac catheterization.  PROCEDURE:  Cardiac catheterization, September 30, 1999, by Dr. Nanetta Batty.  COMPLICATIONS:  None.  CONSULTATIONS:   None.  HOSPITAL COURSE:  The patient was admitted placed on IV heparin, IV nitroglycerin, aspirin, and Plavix and continuation of his home medications. Laboratory studies showed a TSH of 0.495, digoxin 0.8, potassium 3.8, BUN 10, creatinine 1.0.  Hemoglobin 13.4, platelets 250.  Cardiac enzymes were negative.  The patient was taken to the cardiac catheterization lab by Dr. Nanetta Batty on September 30, 1999.  This revealed a widely patent LIMA to the LAD, patent SVG, two diagonals and obtuse marginal patent, SVG to the PDA and distal RCA, patent.  EF 45-50%.  Distal aortography normal.  Dr. Allyson Sabal found no obvious areas of ischemia.  The patient tolerated the procedure well and there were no problems postcatheterization.  On October 01, 1999, Dr. Allyson Sabal felt the patient was stable for discharge home.  DISCHARGE MEDICATIONS:  He will be on: 1. Synthroid 112 mcg a day. 2. Lanoxin 0.25 mg. 3. Aspirin 325 mg a day. 4. Lipitor 40 mg q.h.s. 5. Niaspan 1000 mg q.h.s. 6. Metoprolol 25 mg b.i.d. 7. Altace 2.5 mg a day. 8. Relafen as needed. 9. Nitroglycerin as needed for chest pain.  ACTIVITY:  No strenuous activity, lifting over five pounds or driving for two days.  DIET:  He will follow a low fat, low cholesterol, low salt diet.  WOUND CARE:  He  may shower but should not soak in the tub for two weeks.  SPECIAL INSTRUCTIONS:  Call the office for any problems or questions.  FOLLOWUP:  We will setup a followup appointment with Dr. Allyson Sabal in two weeks. DD:  10/02/99 TD:  10/02/99 Job: [id number]

## 2010-08-01 NOTE — H&P (Signed)
NAME:  Alan Orr, Alan Orr NO.:  [id number]   MEDICAL RECORD NO.:  [id number]                  PATIENT TYPE:   LOCATION:                                       FACILITY:   PHYSICIAN:  Madelin Rear. Sherwood Gambler, M.D.             DATE OF BIRTH:  Jul 29, 1945   DATE OF ADMISSION:  DATE OF DISCHARGE:                                HISTORY & PHYSICAL   CHIEF COMPLAINT:  Back pain.   HISTORY OF PRESENT ILLNESS:  The patient was awakened at 0400 hours on the  day of admission with severe, unrelenting, sharp, lancinating pain on the  right costovertebral angle area.  He had radiation of the pain to the right  groin.  He felt nauseous and definitely noted deep breaths caused more pain  in that right side.  He denied any palpitations or chest pain.  He had no  nausea, vomiting or diarrhea.  There is no hematemesis, hematochezia or  melena.  No cough or sputum.  No palpitations or syncope.   PAST MEDICAL HISTORY:  1. Coronary disease.  2. Hypertension.  3. Hyperlipidemia.  4. Hypothyroidism.  5. Degenerative joint disease and severe back pain secondary to that.  6. He is status post myocardial infarction in 1997 and CABG at that time.  7. Also noted to be non-insulin-dependent diabetic with pretty good blood     sugar control of late.   SOCIAL HISTORY:  Negative for smoking, alcohol or drug use.   FAMILY HISTORY:  Paternal cerebrovascular accident, maternal coronary artery  disease, deceased at the present time.  Brother deceased with leukemia at a  young age, 28.  Also has a brother with coronary artery disease age 6.   REVIEW OF SYSTEMS:  Under HPI.  All else is negative.   PHYSICAL EXAMINATION:  GENERAL:  He is uncomfortable appearing in no acute  distress.  HEAD/NECK:  No JVD or adenopathy.  NECK:  Supple.  CHEST:  Clear.  CARDIAC:  Regular rhythm without murmurs, gallops or rubs.  ABDOMEN:  Positive CVA tenderness markedly so.  No organomegaly or masses.  EXTREMITIES:  Without clubbing, cyanosis or edema.  NEUROLOGIC:  Nonfocal.   Laboratories were obtained in the office which revealed innumerable red  blood cells.  There was no pyuria present.  He had a trace of glucosuria,  but a stat blood sugar was obtained and it was 129.   IMPRESSION:  1. Right sided flank pain secondary to ureteral colic, presumably secondary     to obstructing stone.  CT will be obtained to confirm the diagnosis and     assess the size and location of the stone.  2. Hypertension.  Well controlled at present.  Monitor and p.r.n.     interventions.  3. Coronary artery disease, stable at present.  Expectant observation.  4. Hyperlipidemia.  Continue home medications.  Continue antiplatelet     therapy with aspirin.  5. Degenerative joint disease and back pain.  Monitor and p.r.n. steroids.     ___________________________________________                                         Madelin Rear. Sherwood Gambler, M.D.   LJF/MEDQ  D:  10/20/2003  T:  10/20/2003  Job:  [id number]

## 2010-08-01 NOTE — H&P (Signed)
NAME:  Alan Orr, Alan Orr NO.:  [id number]   MEDICAL RECORD NO.:  [id number]          PATIENT TYPE:  INP   LOCATION:  A316                          FACILITY:  [facility]   PHYSICIAN:  Madelin Rear. Sherwood Gambler, MD  DATE OF BIRTH:  [birth year]   DATE OF ADMISSION:  07/15/2004  DATE OF DISCHARGE:  LH                                HISTORY & PHYSICAL   CHIEF COMPLAINT:  Abdominal pain and flank pain, left side.   HISTORY OF PRESENT ILLNESS:  The patient has had three days of progressively  increasing chest lower left and left CVA area pain.  It is associated with  nausea but no vomiting.  He denies any darkening of urine or hematuria.  No  frequency, urgency, or dysuria.  No fever, rigors, or chills.  There was a  questionable pleuritic component to this discomfort but it is certainly much  more pronounced if he lies on the left side.  He states he tried to live  with it and put up with it, however, it got severely increased today.  He  presented to the office for that, although he entertained going to the  emergency room last night for this pain.   PAST MEDICAL HISTORY:  1.  Hyperlipidemia.  2.  Hypothyroidism.  3.  Hypertension.  4.  Coronary artery disease, status post myocardial infarction and coronary      artery bypass grafting.  5.  Osteoarthritis.  6.  Diabetes mellitus, type 2.   SOCIAL HISTORY:  He denies any contributing factors of smoking.  No alcohol  use.   FAMILY HISTORY:  Positive for CVA, coronary artery disease, leukemia.   REVIEW OF SYSTEMS:  As under HPI, all else negative.   PHYSICAL EXAMINATION:  SKIN:  Unremarkable.  HEAD/NECK:  No JVD or adenopathy.  Neck is supple.  CHEST: Clear.  CARDIAC:  Regular rhythm without gallop or rub.  ABDOMEN:  Positive left CVA tenderness.  Positive marked left upper quadrant  abdominal tenderness.  No organomegaly or masses.  There is guarding as  well.  EXTREMITIES:  No clubbing, cyanosis, or edema.  NEUROLOGIC:   Nonfocal.   LABORATORY:  A CT scan was performed as an outpatient for a ureterolith  search and it was positive for renal stones but no obstructing ureteroliths.  Of specific attention was his aorta and there appeared to be no acute aortic  disease, dissection, etcetera.   IMPRESSION:  1.  The patient has refractory severe pain, left flank of uncertain      etiology.  The differential diagnoses of course is gastric causes,      irritation of the pancreatic tail, certainly ischemic bowel, as well as      possible, although doubtful musculoskeletal etiology.  Due to      intractable pain rearing injectables, he is admitted for analgesia as      well as a more definitive workup.  We will pursue the differential with a CT angiogram and also obtain a CT  chest to be sure it is not supra-diaphragmatic radiating pain.  1.  Coronary artery disease,  expectant observation.  2.  Hypertension, continue home medications, expectant observation.  3.  Hypothyroidism.  Check TSH level.  4.  Diabetes mellitus.  Dietary control.  Monitor sugars.      LJF/MEDQ  D:  07/15/2004  T:  07/15/2004  Job:  [id number]

## 2010-08-01 NOTE — Discharge Summary (Signed)
NAME:  Alan Orr, Alan Orr NO.:  [id number]   MEDICAL RECORD NO.:  [id number]          PATIENT TYPE:  INP   LOCATION:  A316                          FACILITY:  [facility]   PHYSICIAN:  Madelin Rear. Sherwood Gambler, MD  DATE OF BIRTH:  04/20/1945   DATE OF ADMISSION:  07/15/2004  DATE OF DISCHARGE:  05/04/2006LH                                 DISCHARGE SUMMARY   DISCHARGE MEDICATIONS:  1.  Resume usual home medication regimen.  2.  Tylox p.r.n. pain.   DISCHARGE DIAGNOSES:  1.  Hyperlipidemia.  2.  Hypothyroidism.  3.  Hypertension.  4.  Coronary artery disease.  5.  Osteoarthritis.  6.  Diabetes mellitus type 2.   SUMMARY:  The patient was admitted with abdominal pain and flank pain.  On  admission, he was noted to have severe refractory pain and was admitted  mostly for analgesia and definitive workup.  CT angiogram to rule out  mesenteric ischemia was negative.  His pain gradually resolved, and it was  clinically quite clear that it became musculoskeletal in etiology.  He was  discharged for followup in the office.       LJF/MEDQ  D:  07/31/2004  T:  07/31/2004  Job:  [id number]

## 2010-08-01 NOTE — H&P (Signed)
NAME:  Alan Orr, Alan Orr NO.:  [id number]   MEDICAL RECORD NO.:  [id number]          PATIENT TYPE:  INP   LOCATION:  A310                          FACILITY:  [facility]   PHYSICIAN:  Madelin Rear. Sherwood Gambler, MD  DATE OF BIRTH:  1945-10-28   DATE OF ADMISSION:  10/10/2004  DATE OF DISCHARGE:  LH                                HISTORY & PHYSICAL   CHIEF COMPLAINT:  Left flank pain.   HISTORY OF PRESENT ILLNESS:  Patient has severe sudden development of left-  sided flank pain.  He has had previous ureteroliths with some on and off  pain for several days prior to it becoming quite severe and unrelenting.  He  denies any vomiting, fevers, rigors, chills, frequency, urgency or dysuria.   PAST MEDICAL HISTORY:  1.  Hyperlipidemia.  2.  Hypothyroidism.  3.  Hypertension.  4.  Gastroesophageal reflux disease.  5.  Kidney stones, as mentioned above.  6.  Coronary artery disease status post infarction and coronary artery      bypass grafting.  7.  Type 2 diabetes mellitus.  8.  Osteoarthritis.   SOCIAL HISTORY:  Nonsmoker, nondrinker.  No alcohol or other drug use.   FAMILY HISTORY:  Noncontributory.   REVIEW OF SYSTEMS:  As under HPI, all else is negative.   PHYSICAL EXAMINATION:  GENERAL:  He is acutely distressed secondary to pain.  HEAD/NECK:  Show no JVD or adenopathy.  Neck is supple.  CHEST:  Clear.  CARDIAC:  Regular rhythm with no murmur, gallop or rub.  ABDOMEN:  Positive left CVA tenderness, no masses appreciated anteriorly.  No guarding or rebound tenderness.  EXTREMITIES:  Without clubbing, cyanosis or edema.  NEUROLOGIC:  Examination is nonfocal.   DIAGNOSTIC STUDIES:  Urinalysis in the office showed hematuria.  Other  laboratories were drawn and are pending at the present time.  They will be  reviewed when available.   IMPRESSION:  1.  Severe pain secondary to obstructed ureter.  Patient will be admitted      for parenteral analgesia.  Definitive diagnosis  with CT stone protocol.  1.  Coronary artery disease.  Stable at present.  Monitor.  Expectant      observation.  2.  Hypertension.  Monitor.  3.  Diabetes mellitus.  Appropriate diet.  Sliding scale as needed.       LJF/MEDQ  D:  10/10/2004  T:  10/10/2004  Job:  [id number]

## 2010-08-01 NOTE — Discharge Summary (Signed)
NAME:  Alan Orr, Alan Orr NO.:  [id number]   MEDICAL RECORD NO.:  [id number]                   PATIENT TYPE:  INP   LOCATION:  2022                                 FACILITY:  [facility]   PHYSICIAN:  Nanetta Batty, M.D.                DATE OF BIRTH:  05/31/1945   DATE OF ADMISSION:  05/31/2002  DATE OF DISCHARGE:  06/01/2002                                 DISCHARGE SUMMARY   DISCHARGE DIAGNOSES:  1. Chest pain, nonspecific.  2. Coronary artery disease status post coronary artery bypass graft with     patent grafts.  3. Hyperlipidemia.  4. Back pain.   DISCHARGE CONDITION:  Improved.   PROCEDURES:  05/31/02 - combined left heart cath graft visualization by Dr.  Nanetta Batty.   DISCHARGE MEDICATIONS:  1. Enteric-coated aspirin 325 mg daily.  2. Prevacid 30 mg daily.  3. Metoprolol 50 mg one-half tablet twice a day.  4. Lipitor 40 mg as before.  5. Lanoxin 0.25 daily.  6. Niacin 1000 mg one at bedtime.  7. Synthroid 112 mcg as before.  8. Altace 2.5 mg as before.  9. Nitroglycerin as needed for chest pain.  10.      Relafen as before.  11.      Glycogen as before.   DISCHARGE INSTRUCTIONS:  1. Nitroglycerin for chest pain, and back pain medicines as before.  2. No strenuous activity, no sexual activity, no work for three days, then     resume regular activity.  3. A low-salt, low-fat diet.  4. Wash cath site with soap and water.  Call if any bleeding, swelling, or     drainage.  5. Follow up with Dr. Allyson Sabal on 06/09/02 at 11:15 a.m. in [place].   HISTORY OF PRESENT ILLNESS:  The patient was admitted to [facility]  05/31/02.  A 65 year old white married male with a history of myocardial  infarction in 1997, at which point he fell off a roof and fractured his  back, resulting in coronary artery bypass grafting and back surgery.  He has  done well since without significant chest pain until today.  He did have a  cardiac catheterization in  2001 after having chest pain, and his grafts were  patent.   On 05/31/02, he was not doing much activity and developed chest twinges,  described as an 8/10, some shortness of breath, some dizziness, and  discomfort in his left arm.  He took some nitroglycerin with some relief,  and was instructed to come to the emergency room by our office.    PAST MEDICAL HISTORY:  1. Coronary artery bypass graft x5 vessels in 1997.  Saphenous vein graft to     OM, and saphenous vein graft to OM-2.  Saphenous vein graft to the RCA     leading to the LAD.  EF 45-50%.  2. Hypothyroidism.  3. Hyperlipidemia.  4. Back pain  with three back surgeries.  5. Left elbow arthritis.   ALLERGIES:  No known allergies.   OUTPATIENT MEDICATIONS:  1. Metoprolol 50 one-half twice a day.  2. Enteric-coated aspirin 325 daily.  3. Lanoxin 0.25 daily.  4. __________  112 mcg daily.  5. Darvocet p.r.n.  6. Altace 2.5 daily.  7. Lipitor 40 daily.  8. Niaspan 1000 at bedtime.  9. Relafen 500 mg p.r.n.  10.      Vicodin 5/500, 1-2 q.6h.   FAMILY HISTORY / SOCIAL HISTORY / REVIEW OF SYSTEMS:  See H&P.   PHYSICAL EXAMINATION AT DISCHARGE:  VITAL SIGNS:  Blood pressure 134/68,  pulse 58, respirations 20, temperature 97.7, oxygen saturation on two liters  99%.  HEART:  Regular rate and rhythm.  No gallop.  LUNGS:  Clear.  ABDOMEN:  Soft, nontender, with positive bowel sounds.  EXTREMITIES:  Right groin without hematoma.   LABORATORY DATA:  Hemoglobin 14.1, hematocrit 41, WBCs 8.1, MCV 96.2,  platelets 195.  __________  was asked for, but no results are on the chart.  Sodium 137, potassium 3.7, chloride 101, CO2 27, glucose 116, BUN 10,  creatinine 0.9, calcium 8.3.  Cardiac enzymes with CK of 81, MB 1.3.  Digoxin level 0.3.  The initial labs in the emergency room were not part of  the green sheet on this chart.  Portable chest x-ray post CABG - heart and  lungs within normal limits.  No active disease.  In  addition, I have two  chest x-ray reports on the same patient, both dated the same with the same  time.  One states post CABG no active disease; the second states no acute  cardiopulmonary process, fullness in the right hilar region, __________  vascularity, but a 2-D chest x-ray was recommended to exclude adenopathy.  In the emergency room, the chest x-ray that we visualized was no active  disease.   EKG revealed sinus bradycardia with first degree AV block.  Anterior septal  infarction, age indeterminate.   Cardiac catheterization on 05/31/02 by Dr. Allyson Sabal.  EF of 50%, patent graft.  I did not feel the chest pain was cardiac.  Empiric PPI.    HOSPITAL COURSE:  The patient was admitted on 05/31/02 by Dr. Allyson Sabal when he  came in with what appeared to be unstable angina.  Cardiac enzymes negative.  The patient was taken to the cath lab and found to have a patent graft, and  was treated with a proton pump inhibitor.  By 06/01/02, the patient was  stable, no further chest pain, and he was discharged home with follow up as  an outpatient.  He was discharged on Nexium.      Darcella Gasman. Valarie Merino                     Nanetta Batty, M.D.    LRI/MEDQ  D:  06/20/2002  T:  06/22/2002  Job:  [id number]   cc:   Madelin Rear. Sherwood Gambler, M.D.  [address]  [place]  [place] [address]  Fax: [phone]

## 2010-08-12 ENCOUNTER — Emergency Department (HOSPITAL_COMMUNITY)
Admission: EM | Admit: 2010-08-12 | Discharge: 2010-08-13 | Disposition: A | Discharge status: Other Acute Inpatient Hospital | Payer: Medicare Other | Source: Home / Self Care | Attending: Emergency Medicine | Admitting: Emergency Medicine

## 2010-08-12 LAB — BASIC METABOLIC PANEL
CO2: 27 mEq/L (ref 19–32)
Calcium: 9.6 mg/dL (ref 8.4–10.5)
Creatinine, Ser: 1.18 mg/dL (ref 0.4–1.5)
Glucose, Bld: 121 mg/dL — ABNORMAL HIGH (ref 70–99)

## 2010-08-12 LAB — CBC
Hemoglobin: 12.6 g/dL — ABNORMAL LOW (ref 13.0–17.0)
MCH: 33.3 pg (ref 26.0–34.0)
MCHC: 33.3 g/dL (ref 30.0–36.0)
MCV: 100 fL (ref 78.0–100.0)
Platelets: 244 10*3/uL (ref 150–400)

## 2010-08-12 LAB — TYPE AND SCREEN: ABO/RH(D): A POS

## 2010-08-13 ENCOUNTER — Inpatient Hospital Stay (HOSPITAL_COMMUNITY)
Admission: EM | Admit: 2010-08-13 | Discharge: 2010-08-16 | DRG: 394 | Disposition: A | Discharge status: Home / Self Care | Payer: Medicare Other | Source: Other Acute Inpatient Hospital | Attending: Cardiovascular Disease | Admitting: Cardiovascular Disease

## 2010-08-13 DIAGNOSIS — Z86718 Personal history of other venous thrombosis and embolism: Secondary | ICD-10-CM

## 2010-08-13 DIAGNOSIS — Z951 Presence of aortocoronary bypass graft: Secondary | ICD-10-CM

## 2010-08-13 DIAGNOSIS — I472 Ventricular tachycardia, unspecified: Secondary | ICD-10-CM | POA: Diagnosis present

## 2010-08-13 DIAGNOSIS — E119 Type 2 diabetes mellitus without complications: Secondary | ICD-10-CM | POA: Diagnosis present

## 2010-08-13 DIAGNOSIS — Z79899 Other long term (current) drug therapy: Secondary | ICD-10-CM

## 2010-08-13 DIAGNOSIS — I2510 Atherosclerotic heart disease of native coronary artery without angina pectoris: Secondary | ICD-10-CM | POA: Diagnosis present

## 2010-08-13 DIAGNOSIS — I2582 Chronic total occlusion of coronary artery: Secondary | ICD-10-CM | POA: Diagnosis present

## 2010-08-13 DIAGNOSIS — E039 Hypothyroidism, unspecified: Secondary | ICD-10-CM | POA: Diagnosis present

## 2010-08-13 DIAGNOSIS — Z95810 Presence of automatic (implantable) cardiac defibrillator: Secondary | ICD-10-CM

## 2010-08-13 DIAGNOSIS — I4729 Other ventricular tachycardia: Secondary | ICD-10-CM | POA: Diagnosis present

## 2010-08-13 DIAGNOSIS — I2589 Other forms of chronic ischemic heart disease: Secondary | ICD-10-CM | POA: Diagnosis present

## 2010-08-13 DIAGNOSIS — K644 Residual hemorrhoidal skin tags: Principal | ICD-10-CM | POA: Diagnosis present

## 2010-08-13 DIAGNOSIS — E785 Hyperlipidemia, unspecified: Secondary | ICD-10-CM | POA: Diagnosis present

## 2010-08-13 DIAGNOSIS — I253 Aneurysm of heart: Secondary | ICD-10-CM | POA: Diagnosis present

## 2010-08-13 DIAGNOSIS — Z7901 Long term (current) use of anticoagulants: Secondary | ICD-10-CM

## 2010-08-13 DIAGNOSIS — Z7982 Long term (current) use of aspirin: Secondary | ICD-10-CM

## 2010-08-13 DIAGNOSIS — I10 Essential (primary) hypertension: Secondary | ICD-10-CM | POA: Diagnosis present

## 2010-08-13 LAB — CBC
HCT: 35.3 % — ABNORMAL LOW (ref 39.0–52.0)
Hemoglobin: 11.7 g/dL — ABNORMAL LOW (ref 13.0–17.0)
MCV: 98.3 fL (ref 78.0–100.0)
RDW: 14.2 % (ref 11.5–15.5)
WBC: 6.3 10*3/uL (ref 4.0–10.5)

## 2010-08-13 LAB — COMPREHENSIVE METABOLIC PANEL
ALT: 21 U/L (ref 0–53)
AST: 25 U/L (ref 0–37)
Albumin: 2.8 g/dL — ABNORMAL LOW (ref 3.5–5.2)
Alkaline Phosphatase: 54 U/L (ref 39–117)
BUN: 13 mg/dL (ref 6–23)
Chloride: 106 mEq/L (ref 96–112)
GFR calc Af Amer: >60 mL/min (ref >60)
Potassium: 3.9 mEq/L (ref 3.5–5.1)
Sodium: 142 mEq/L (ref 135–145)
Total Bilirubin: 0.2 mg/dL — ABNORMAL LOW (ref 0.3–1.2)
Total Protein: 5.4 g/dL — ABNORMAL LOW (ref 6.0–8.3)

## 2010-08-13 LAB — GLUCOSE, CAPILLARY
Glucose-Capillary: 104 mg/dL — ABNORMAL HIGH (ref 70–99)
Glucose-Capillary: 93 mg/dL (ref 70–99)

## 2010-08-13 LAB — APTT: aPTT: 41 seconds — ABNORMAL HIGH (ref 24–37)

## 2010-08-13 LAB — HEPARIN LEVEL (UNFRACTIONATED): Heparin Unfractionated: 0.37 IU/mL (ref 0.30–0.70)

## 2010-08-14 LAB — GLUCOSE, CAPILLARY
Glucose-Capillary: 128 mg/dL — ABNORMAL HIGH (ref 70–99)
Glucose-Capillary: 109 mg/dL — ABNORMAL HIGH (ref 70–99)

## 2010-08-14 LAB — CBC
HCT: 35.3 % — ABNORMAL LOW (ref 39.0–52.0)
MCH: 33.2 pg (ref 26.0–34.0)
MCV: 99.4 fL (ref 78.0–100.0)
Platelets: 192 10*3/uL (ref 150–400)
RDW: 14.3 % (ref 11.5–15.5)

## 2010-08-15 LAB — GLUCOSE, CAPILLARY
Glucose-Capillary: 113 mg/dL — ABNORMAL HIGH (ref 70–99)
Glucose-Capillary: 114 mg/dL — ABNORMAL HIGH (ref 70–99)
Glucose-Capillary: 107 mg/dL — ABNORMAL HIGH (ref 70–99)

## 2010-08-15 LAB — CBC
HCT: 36.1 % — ABNORMAL LOW (ref 39.0–52.0)
MCH: 33.3 pg (ref 26.0–34.0)
MCV: 99.4 fL (ref 78.0–100.0)
Platelets: 189 10*3/uL (ref 150–400)
RBC: 3.63 MIL/uL — ABNORMAL LOW (ref 4.22–5.81)
RDW: 14.3 % (ref 11.5–15.5)
WBC: 6.3 10*3/uL (ref 4.0–10.5)

## 2010-08-16 DIAGNOSIS — D689 Coagulation defect, unspecified: Secondary | ICD-10-CM

## 2010-08-16 DIAGNOSIS — K648 Other hemorrhoids: Secondary | ICD-10-CM

## 2010-08-16 DIAGNOSIS — D62 Acute posthemorrhagic anemia: Secondary | ICD-10-CM

## 2010-08-16 DIAGNOSIS — K625 Hemorrhage of anus and rectum: Secondary | ICD-10-CM

## 2010-08-16 LAB — CBC
MCH: 33.6 pg (ref 26.0–34.0)
MCHC: 34 g/dL (ref 30.0–36.0)
MCV: 98.9 fL (ref 78.0–100.0)
Platelets: 184 10*3/uL (ref 150–400)
RBC: 3.48 MIL/uL — ABNORMAL LOW (ref 4.22–5.81)

## 2010-08-16 LAB — GLUCOSE, CAPILLARY: Glucose-Capillary: 83 mg/dL (ref 70–99)

## 2010-08-27 NOTE — Discharge Summary (Signed)
  NAME:  Alan Orr, MANNA NO.:  [id number]  MEDICAL RECORD NO.:  [id number]           PATIENT TYPE:  I  LOCATION:  2006                         FACILITY:  [facility]  PHYSICIAN:  Nanetta Batty, M.D.   DATE OF BIRTH:  27-Dec-1945  DATE OF ADMISSION:  08/13/2010 DATE OF DISCHARGE:  08/16/2010                              DISCHARGE SUMMARY   DISCHARGE DIAGNOSES: 1. Recurrent hemorrhoidal bleeding this admission. 2. History of past arterial emboli, on Pradaxa prior to admission. 3. Intolerance to Coumadin as the patient's INR was not able to be     regulated. 4. Ischemic cardiomyopathy with an ejection fraction of 35%. 5. Coronary artery disease, status post remote coronary artery bypass     grafting, last catheterization February 2011, plan is for medical     therapy. 6. History of paroxysmal atrial fibrillation, on amiodarone. 7. Biventricular implantable cardiac defibrillator, February 2011. 8. History of type 2 diabetes.  HOSPITAL COURSE:  The patient is a pleasant 65 year old male with coronary artery disease and previous bypass grafting in 1997.  He has an ischemic cardiomyopathy.  He is status post BiV ICD.  He has had previous arterial emboli on multiple occasions requiring surgery.  He was on Coumadin but we were unable to regulate his INR as an outpatient and he was ultimately changed to Pradaxa.  He was admitted on Aug 13, 2010, with lower GI bleeding.  His hemoglobin was stable.  He was admitted to telemetry, his Pradaxa was held.  He was started on IV heparin and seen in consult by Dr. Lina Sar.  The plan was for treatment of his internal hemorrhoids which had been seen on previous colonoscopy.  This did well for about 48 hours and we resumed his Pradaxa but he had recurrent bleeding on August 15, 2010.  His Pradaxa was held again and he was taken for colonoscopy on August 16, 2010.  This revealed small internal hemorrhoids and anal erosions.  Plan is  for conservative therapy with Anusol.  Dr. Allyson Sabal feels that he can go back on his Pradaxa, he may have to deal with some degree of lower GI bleeding.  His hemoglobin remained stable at 11.7.  We feel he can be discharged on August 16, 2010.  Please see med rec for complete discharge medications.  LABORATORY DATA:  As noted, hemoglobin is 11.7, white count 6.7, platelets 192, hematocrit 35.3.  DISPOSITION:  The patient discharged in stable condition and will follow up with Dr. Alanda Amass in [place].     Abelino Derrick, P.A.   ______________________________ Nanetta Batty, M.D.    Lenard Lance  D:  08/16/2010  T:  08/16/2010  Job:  [id number]  cc:   Hedwig Morton. Juanda Chance, MD Madelin Rear. Sherwood Gambler, MD  Electronically Signed by Corine Shelter P.A. on 08/18/2010 02:14:29 PM Electronically Signed by Nanetta Batty M.D. on 08/27/2010 10:45:49 AM

## 2010-08-27 NOTE — Discharge Summary (Signed)
  NAME:  [name], [name] NO.:  [id number]  MEDICAL RECORD NO.:  [id number]           PATIENT TYPE:  I  LOCATION:  2006                         FACILITY:  [facility]  PHYSICIAN:  Nanetta Batty, M.D.   DATE OF BIRTH:  1945/07/07  DATE OF ADMISSION:  08/13/2010 DATE OF DISCHARGE:  08/16/2010                              DISCHARGE SUMMARY   ADDENDUM:  At discharge, the patient was seen by Dr. Royann Shivers, who decided to cut Alan Orr to 75 mg b.i.d.     Abelino Derrick, P.A.   ______________________________ Nanetta Batty, M.D.    Alan Orr  D:  08/16/2010  T:  08/16/2010  Job:  [id number]  Electronically Signed by Corine Shelter P.A. on 08/18/2010 02:14:38 PM Electronically Signed by Nanetta Batty M.D. on 08/27/2010 10:45:51 AM

## 2010-10-08 NOTE — H&P (Signed)
NAME:  Alan Orr, KIDNEY NO.:  [id number]  MEDICAL RECORD NO.:  [id number]           PATIENT TYPE:  I  LOCATION:  3311                         FACILITY:  [facility]  PHYSICIAN:  Nanetta Batty, M.D.   DATE OF BIRTH:  January 03, 1946  DATE OF ADMISSION:  08/13/2010 DATE OF DISCHARGE:                             HISTORY & PHYSICAL   PRIMARY CARDIOLOGIST:  Nanetta Batty, MD  HISTORY OF PRESENT ILLNESS:  Alan Orr is a very pleasant 65 year old white male with history of coronary atherosclerotic heart disease status post coronary artery bypass graft in 1997 with ischemic cardiomyopathy and ejection fraction of 30-35% with apical aneurysm and history of brachial embolism as well as left popliteal and anterior tibial embolism status post thrombectomy who is received in transfer from [facility] where he presented yesterday evening with complaints of GI bleeding.  He reports that he noted blood in his stools starting on Monday.  Yesterday, he did well throughout the day and was able to carry out his normal activities, then yesterday afternoon he went in to have a bowel movement and had bright red blood per his rectum.  He denies any lightheadedness or dizziness.  Denies any syncope or presyncope.  He has not experienced any nausea or vomiting.  No hematemesis.  He has not had another bowel movement since that time, however, has had a small amount of bleeding from his rectum which was noted on his bedding.  He has been on Pradaxa for the last year as he has had difficulty to regulate INRs on Coumadin.  Until this point, he has done well on the Pradaxa.  He has had GI bleeding in the past on Coumadin with a supratherapeutic INR and underwent GI workup at that time.  Currently, he has no complaints. While at [facility], his CBC revealed a hemoglobin of 12.6 and a hematocrit 37.8 and at that time he was stable given that he is on the Pradaxa for cardiac implications.  We  were asked to admit and treat him accordingly.  Currently, he has no complaints.  PAST MEDICAL HISTORY: 1. Coronary atherosclerotic heart disease.  Cardiac catheterization     done in February 2011 revealed normal left main.  The circumflex     was normal.  The LAD was 100% occluded after the first septal     perforator.  The RCA was 100% occluded in its midportion.  The     saphenous vein graft to the PDA was patent.  The saphenous vein     graft to the obtuse marginal branch was widely patent.  The     saphenous vein graft to the diagonal branch was patent.  The LIMA     to the LAD was widely patent.  There was evidence of small vessels     with diabetic-type disease in all of these vessels. 2. Coronary artery bypass graft in 1997. 3. Status post BiV ICD in February 2011. 4. History of ventricular tachycardia. 5. Systolic and diastolic dysfunction. 6. 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 7. Status post left popliteal and anterior tibial embolism, status  post lumpectomy in September 2010.8. Dyslipidemia. 9. Hypertension. 10.Type 2 diabetes mellitus. 11.Chronic anticoagulation with Pradaxa. 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 14.History of renal calculi.  FAMILY HISTORY:  Positive for coronary artery disease in his brother.  SOCIAL HISTORY:  He is married.  He has 2 children.  No tobacco or alcohol use.  He quit smoking in 1997.  ALLERGIES:  None known.  CURRENT MEDICATIONS: 1. Pradaxa 150 mg b.i.d. 2. Lasix 20 mg p.o. p.r.n. 3. Aspirin 81 mg daily. 4. Metoprolol 50 mg b.i.d. 5. Synthroid 112 mcg daily. 6. Actos 45 mg daily. 7. Niaspan 1000 mg every night. 8. Amiodarone 200 mg daily. 9. Zetia 10 mg daily. 10.Ambien 10 mg every night. 11.Digoxin 0.125 mg daily. 12.Tylox 5/500 mg p.r.n. 13.Lipitor 40 mg daily. 14.Metformin 500 mg b.i.d. 15.Mag-Ox 400 mg daily. 16.Aldactone 25 mg daily. 17.Ramipril 2.5 mg daily. 18.Xanax 1 mg q.i.d. p.r.n.  REVIEW  OF SYSTEMS:  As per HPI, otherwise negative.  PHYSICAL EXAMINATION:  VITAL SIGNS:  Blood pressure is 112/52, pulse is 54, respirations 18, and temperature is 97.5 with an O2 sat of 95%. GENERAL:  This is a very pleasant 65 year old white male in no acute distress. HEENT:  Pupils are equal and reactive to light and accommodation. Extraocular movements are intact. NECK:  Supple.  No JVD.  No carotid bruits or thyromegaly. CARDIOVASCULAR:  Regular rate and rhythm.  S1 and S2 without appreciable murmur, gallop, or rub. LUNGS:  Clear to auscultation bilaterally with normal respiratory effort. EXTREMITIES:  There is trace to 1+ edema in the left lower extremity. No edema on the right.  Distal pulses are present. ABDOMEN:  Soft, nontender without hepatosplenomegaly or masses. EXTREMITIES:  Bowel sounds present x4. NEUROLOGIC:  Alert and oriented x3.  Cranial nerves II-XII are grossly intact.  LABORATORY DATA:  Tele reveals paced rhythm.  Hemoglobin is 12.6, hematocrit 37.8 that was done at 7 p.m. on Aug 12, 2010.  His sodium is 135, potassium was 4.3, BUN is 14, creatinine 1.18 with a glucose of 121.IMPRESSION: 1. Gastrointestinal bleeding, likely secondary to Pradaxa. 2. History of multiple embolic events. 3. Apical aneurysm/apical thrombus. 4. History of coronary artery disease, status post coronary artery     bypass grafting. 5. Type 2 diabetes mellitus. 6. Hypothyroidism. 7. Dyslipidemia. 8. Status post ischemic cardiomyopathy with ejection fraction of 30-     35%. 9. Status post biventricular implantable cardioverter-defibrillator. 10.History of ventricular tachycardia.  PLAN:  We will hold his Pradaxa.  At this time, we will continue with his aspirin 81 mg.  I have spoken with pharmacist who recommends beginning IV heparin at 10 a.m. this morning with no bolus at a low therapeutic dose and monitor him closely.  We will have GI see him this morning for evaluation, keep him  n.p.o. except for ice chips.  We will give him Protonix 40 mg IV now and twice daily.  Evidently, he has had difficulties regulating his INRs in the past, therefore we will have to discuss the appropriate option for anticoagulation for him at this time and we will follow up a CBC, this is pending currently.  We will type and screen him and transfuse if needed.  He has normal creatinine clearance, therefore the Pradaxa should be eliminated without need for FFPs.  Given his history of embolic events, we will allow this to clear system and begin heparin and monitor his stools for blood.  We will continue with his home meds.    ______________________________ [employer], NP  ______________________________ Nanetta Batty, M.D.    LS/MEDQ  D:  08/13/2010  T:  08/13/2010  Job:  [id number]  cc:   Madelin Rear. Sherwood Gambler, MD [facility]  Electronically Signed by Charmian Muff NP on 09/22/2010 09:55:58 PM Electronically Signed by Nanetta Batty M.D. on 10/08/2010 03:22:16 PM

## 2010-11-05 ENCOUNTER — Other Ambulatory Visit (HOSPITAL_COMMUNITY): Payer: Self-pay | Admitting: Neurosurgery

## 2010-11-05 DIAGNOSIS — M48061 Spinal stenosis, lumbar region without neurogenic claudication: Secondary | ICD-10-CM

## 2010-11-07 ENCOUNTER — Other Ambulatory Visit (HOSPITAL_COMMUNITY): Payer: Self-pay | Admitting: Neurosurgery

## 2010-11-07 ENCOUNTER — Ambulatory Visit (HOSPITAL_COMMUNITY)
Admission: RE | Admit: 2010-11-07 | Discharge: 2010-11-07 | Disposition: A | Discharge status: Home / Self Care | Payer: Medicare Other | Source: Ambulatory Visit | Attending: Neurosurgery | Admitting: Neurosurgery

## 2010-11-07 ENCOUNTER — Encounter (HOSPITAL_COMMUNITY)
Admission: RE | Admit: 2010-11-07 | Discharge: 2010-11-07 | Disposition: A | Discharge status: Home / Self Care | Payer: Medicare Other | Source: Ambulatory Visit | Attending: Neurosurgery | Admitting: Neurosurgery

## 2010-11-07 DIAGNOSIS — M48061 Spinal stenosis, lumbar region without neurogenic claudication: Secondary | ICD-10-CM

## 2010-11-07 DIAGNOSIS — Z01812 Encounter for preprocedural laboratory examination: Secondary | ICD-10-CM | POA: Insufficient documentation

## 2010-11-07 DIAGNOSIS — Z01818 Encounter for other preprocedural examination: Secondary | ICD-10-CM | POA: Insufficient documentation

## 2010-11-07 LAB — CBC
HCT: 40.4 % (ref 39.0–52.0)
Hemoglobin: 13.9 g/dL (ref 13.0–17.0)
MCH: 33.8 pg (ref 26.0–34.0)
MCHC: 34.4 g/dL (ref 30.0–36.0)
MCV: 98.3 fL (ref 78.0–100.0)
Platelets: 214 10*3/uL (ref 150–400)
RBC: 4.11 MIL/uL — ABNORMAL LOW (ref 4.22–5.81)
RDW: 14.2 % (ref 11.5–15.5)
WBC: 5.4 10*3/uL (ref 4.0–10.5)

## 2010-11-07 LAB — TYPE AND SCREEN
ABO/RH(D): A POS
Antibody Screen: NEGATIVE

## 2010-11-07 LAB — PROTIME-INR
INR: 1.01 (ref 0.00–1.49)
Prothrombin Time: 13.5 seconds (ref 11.6–15.2)

## 2010-11-07 LAB — BASIC METABOLIC PANEL
BUN: 10 mg/dL (ref 6–23)
CO2: 31 mEq/L (ref 19–32)
Calcium: 9.6 mg/dL (ref 8.4–10.5)
Chloride: 102 mEq/L (ref 96–112)
Creatinine, Ser: 1.06 mg/dL (ref 0.50–1.35)
GFR calc Af Amer: >60 mL/min (ref >60)
GFR calc non Af Amer: >60 mL/min (ref >60)
Glucose, Bld: 97 mg/dL (ref 70–99)
Potassium: 4.6 mEq/L (ref 3.5–5.1)
Sodium: 138 mEq/L (ref 135–145)

## 2010-11-07 LAB — SURGICAL PCR SCREEN
MRSA, PCR: NEGATIVE
Staphylococcus aureus: POSITIVE — AB

## 2010-11-07 LAB — APTT: aPTT: 35 seconds (ref 24–37)

## 2010-11-10 ENCOUNTER — Ambulatory Visit (HOSPITAL_COMMUNITY)
Admission: RE | Admit: 2010-11-10 | Discharge: 2010-11-10 | Disposition: A | Discharge status: Home / Self Care | Payer: Medicare Other | Source: Ambulatory Visit | Attending: Neurosurgery | Admitting: Neurosurgery

## 2010-11-10 DIAGNOSIS — M545 Low back pain, unspecified: Secondary | ICD-10-CM | POA: Insufficient documentation

## 2010-11-10 DIAGNOSIS — M48061 Spinal stenosis, lumbar region without neurogenic claudication: Secondary | ICD-10-CM

## 2010-11-10 DIAGNOSIS — Z981 Arthrodesis status: Secondary | ICD-10-CM | POA: Insufficient documentation

## 2010-11-10 LAB — GLUCOSE, CAPILLARY
Glucose-Capillary: 95 mg/dL (ref 70–99)
Glucose-Capillary: 98 mg/dL (ref 70–99)

## 2010-11-10 MED ORDER — IOHEXOL 180 MG/ML  SOLN
20.0000 mL | Freq: Once | INTRAMUSCULAR | Status: AC | PRN
  Administered 2010-11-10: 20 mL via INTRATHECAL

## 2010-11-13 ENCOUNTER — Inpatient Hospital Stay (HOSPITAL_COMMUNITY): Payer: Medicare Other

## 2010-11-13 ENCOUNTER — Inpatient Hospital Stay (HOSPITAL_COMMUNITY)
Admission: RE | Admit: 2010-11-13 | Discharge: 2010-11-15 | DRG: 460 | Disposition: A | Discharge status: Home / Self Care | Payer: Medicare Other | Source: Ambulatory Visit | Attending: Neurosurgery | Admitting: Neurosurgery

## 2010-11-13 DIAGNOSIS — M5126 Other intervertebral disc displacement, lumbar region: Principal | ICD-10-CM | POA: Diagnosis present

## 2010-11-13 DIAGNOSIS — Z79899 Other long term (current) drug therapy: Secondary | ICD-10-CM

## 2010-11-13 DIAGNOSIS — M51379 Other intervertebral disc degeneration, lumbosacral region without mention of lumbar back pain or lower extremity pain: Secondary | ICD-10-CM | POA: Diagnosis present

## 2010-11-13 DIAGNOSIS — M47817 Spondylosis without myelopathy or radiculopathy, lumbosacral region: Secondary | ICD-10-CM | POA: Diagnosis present

## 2010-11-13 DIAGNOSIS — M5137 Other intervertebral disc degeneration, lumbosacral region: Secondary | ICD-10-CM | POA: Diagnosis present

## 2010-11-13 DIAGNOSIS — Z7982 Long term (current) use of aspirin: Secondary | ICD-10-CM

## 2010-11-13 DIAGNOSIS — Z981 Arthrodesis status: Secondary | ICD-10-CM

## 2010-11-13 LAB — GLUCOSE, CAPILLARY
Glucose-Capillary: 93 mg/dL (ref 70–99)
Glucose-Capillary: 94 mg/dL (ref 70–99)
Glucose-Capillary: 114 mg/dL — ABNORMAL HIGH (ref 70–99)
Glucose-Capillary: 122 mg/dL — ABNORMAL HIGH (ref 70–99)

## 2010-11-14 LAB — GLUCOSE, CAPILLARY
Glucose-Capillary: 80 mg/dL (ref 70–99)
Glucose-Capillary: 125 mg/dL — ABNORMAL HIGH (ref 70–99)
Glucose-Capillary: 97 mg/dL (ref 70–99)
Glucose-Capillary: 112 mg/dL — ABNORMAL HIGH (ref 70–99)

## 2010-11-15 LAB — GLUCOSE, CAPILLARY: Glucose-Capillary: 93 mg/dL (ref 70–99)

## 2010-11-28 NOTE — Op Note (Signed)
NAMEELMER, MERWIN NO.:  [id number]  MEDICAL RECORD NO.:  [id number]  LOCATION:  3172                         FACILITY:  [facility]  PHYSICIAN:  Hewitt Shorts, M.D.DATE OF BIRTH:  [birth year]  DATE OF PROCEDURE:  11/13/2010 DATE OF DISCHARGE:                              OPERATIVE REPORT   PREOPERATIVE DIAGNOSIS:  L4-5 lumbar disk herniation, lumbar degenerative disease, lumbar spondylosis, and lumbar stenosis.  POSTOPERATIVE DIAGNOSIS:  L4-5 lumbar disk herniation, lumbar degenerative disease, lumbar spondylosis, and lumbar stenosis.  PROCEDURE:  Bilateral L4-5 lumbar decompression including laminotomy, facetectomy, and foraminotomy with decompression beyond that required for interbody arthrodesis with decompression of the exiting L4 and L5 nerve roots bilaterally.  Bilateral L4-5 post lumbar interbody arthrodesis with AVS PEEK interbody implant and Vitoss with bone marrow aspirate and Infuse and bilateral posterior arthrodesis at L4-5 with Vitoss with bone marrow aspirate and Infuse and segmental post instrumentation with microdissection microsurgical technique.  SURGEON:  Hewitt Shorts, MD.  ASSISTANT:  Danae Orleans. Venetia Maxon, MD.  ANESTHESIA:  General endotracheal.  INDICATIONS:  The patient is a 65 year old man who is status post previous L2-L4 lumbar fusion that was done 15 years ago for traumatic L2- 3 locked facets.  He had recovered well from that surgery, but developed increasing left lumbar radicular pain.  He presented with myelogram, post myelogram, and CT scan, which showed progressive stenosis of the L4- 5 level.  The previous instrumentation was noted to be ISOLA, but we do not know the details of the instrumentation, and therefore it is uncertain whether we have to remove the bone instrumentation or whether would be able to tie into old instrumentation with a new instrumentation, and then we were able to tie into the old posterior  instrumentation.  DESCRIPTION OF PROCEDURE:  The patient was brought to the operating room and placed under general endotracheal anesthesia.  The patient was turned to a prone position.  Lumbar region was prepped with Betadine soap and solution and draped in a sterile fashion.  The midline was infiltrated with local anesthetic with epinephrine.  X-ray was taken. The L4-5 level identified and midline incision made over the L4-L5 and extended rostrally to expose the L3-4 levels and we were able to expose the instrumentation at that level as well.  Dissection was carried down through the subcutaneous tissues to the lumbar fascia which was incised bilaterally.  Paraspinal muscles were dissected from the spinous process and lamina in a subperiosteal fashion.  An x-ray was taken to the L4-5, interlaminar space was identified.  We then identified where the posterior instrumentation of the scar tissue overlying the lower portion of the posterior instrumentation was dissected away from it and we exposed the screw heads at L3 and L4 and the intervening rod.  There was some bony growth from the fusion around the rod and beneath it as we exposed the screw heads, it was found that the system previously used was a 6.35 (quarter inch) rod.  We then proceeded with decompression.  The laminotomy and facetectomy was performed using high-speed drill and Kerrison punches.  Ligamentum flavum was carefully removed and we identified the thecal sac, and then initially identified the  L5 nerve roots and good foraminotomies to prevent the exiting nerve root.  I then turned my attention to the L4 nerve roots where there was significant spondylotic encroachment to the facet arthropathy and hypertrophy.  This was carefully removed and we were able to decompress the exiting L4 nerve roots bilaterally.  We then identified the interbody space and the annulus.  There was spondylotic subligamentous disk herniation to  the left.  This was carefully removed as well which help with decompress the thecal sac and exiting nerve roots.  We then entered into the disk space which was quite narrowed.  We were able to gradually open the disk space and doing so continued the diskectomy we removing degenerated disk materials.  We then began to prepare the endplates of the vertebral bodies, removing the cartilaginous endplate surfaces down to good bony surface.  The cartilage was removed using a paddle curette.  Once the interbody space had been prepared for interbody arthrodesis, the neural structures had been decompressed.  We then draped the C-arm fluoroscope was brought into the field and we identified the pedicles entry points for the L5 pedicles bilaterally.  Each pedicle was probed, bone marrow was aspirated and injected over 10 mL strip of Vitoss, a small infuse was prepared as well.  We exposed the previous rods between the screw heads at L3-L4. Overgrown bone was carefully removed using osteotome and high-speed drill and then we proceeded with the interbody arthrodesis.  We used 7 x 25 x 4 degree implant 8 mm in width, each was packed with combination of Vitoss bone marrow aspirate and Infuse.  We placed the first implanting on the patient's right side and we went then back to the left side, packed the midline with additional Vitoss with bone marrow aspirate and Infuse and gently tamped the second implanting on the left side.  Both implants were positioned in the disk space and countersunk and we were able to pass some additional Vitoss bone marrow aspirate in the posterior aspect of disk space.  Once the interbody implants were in place, we began our fusion construct.  We used a hybrid construct using a Medtronic 6.35-5.5 connector to connect between the two different rods.  This was secured to the previous rod and then from L2-L4.  We used radius offset connectors to the screw heads at L5 and then  used a 60-mm rod to connect between the Medtronic 5.5 connection and the radius offset 5.5 connection.  Once the construct was set up, we secured the locking caps and tightening knots, and once each was snug, we then did final tightening with a Medtronic system, a breakaway system, and fully tightened until they broke away, and then the radius locking caps were tightened fully.  Prior to placing the final construct, we did pack the lateral gutter with Infuse and Vitoss bone marrow aspirate.  We exposed the transverse process of L4 and L5.  They were decorticated and we packed the Infuse down and then the Vitoss bone marrow aspirate over it with care taken to ensure that none of the graft material extended into the laminotomy or foraminotomy defects.  Once the construct was completed, the wound which had been irrigated numerous times throughout the procedure both with saline solution and bacitracin solution, was checked for hemostasis, which was confirmed and proceed with closure. Deep fascia closed with interrupted undyed #1 Vicryl suture.  Scarpa fascia was closed with interrupted undyed #1 Vicryl suture. Subcutaneous and subcuticular closed with interrupted inverted  2-0 undyed Vicryl sutures.  Skin was closed with Dermabond.  Wound was dressed with sterile gauze and Hypafix.  Procedure was tolerated well. Estimated blood loss was 250 mL.  We were able to return 120 mL of Cell Saver blood to the patient.  Sponge and instrument count correct.  Following surgery, the patient was turned back to supine position, reversed from the anesthetic, extubated, and transferred to recovery room for further care, was noted to be moving all four extremities to command.     Hewitt Shorts, M.D.     RWN/MEDQ  D:  11/13/2010  T:  11/13/2010  Job:  [id number]  Electronically Signed by Shirlean Kelly M.D. on 11/28/2010 05:13:56 PM

## 2010-12-22 LAB — HEPATIC FUNCTION PANEL
ALT: 32
AST: 23
Albumin: 3.5
Alkaline Phosphatase: 44
Bilirubin, Direct: 0.2
Indirect Bilirubin: 0.7
Total Bilirubin: 0.9
Total Protein: 6.4

## 2010-12-22 LAB — I-STAT 8, (EC8 V) (CONVERTED LAB)
Acid-Base Excess: 5 — ABNORMAL HIGH
BUN: 6
Bicarbonate: 30.2 — ABNORMAL HIGH
Chloride: 105
Glucose, Bld: 136 — ABNORMAL HIGH
HCT: 45
Hemoglobin: 15.3
Operator id: 284251
Potassium: 4.4
Sodium: 139
TCO2: 31
pCO2, Ven: 43.1 — ABNORMAL LOW
pH, Ven: 7.453 — ABNORMAL HIGH

## 2010-12-22 LAB — POCT CARDIAC MARKERS
CKMB, poc: 1.5
Myoglobin, poc: 119
Operator id: 284251
Troponin i, poc: <0.05

## 2010-12-22 LAB — PROTIME-INR
INR: 2.1 — ABNORMAL HIGH
Prothrombin Time: 23.8 — ABNORMAL HIGH

## 2010-12-22 LAB — POCT I-STAT CREATININE
Creatinine, Ser: 1
Operator id: 284251

## 2010-12-22 LAB — LIPASE, BLOOD: Lipase: 27

## 2010-12-24 LAB — CBC
HCT: 36.1 — ABNORMAL LOW
HCT: 36.7 — ABNORMAL LOW
HCT: 37.1 — ABNORMAL LOW
HCT: 37.2 — ABNORMAL LOW
HCT: 37.3 — ABNORMAL LOW
HCT: 37.7 — ABNORMAL LOW
HCT: 38.1 — ABNORMAL LOW
HCT: 38.5 — ABNORMAL LOW
Hemoglobin: 12.2 — ABNORMAL LOW
Hemoglobin: 12.4 — ABNORMAL LOW
Hemoglobin: 12.6 — ABNORMAL LOW
Hemoglobin: 12.6 — ABNORMAL LOW
Hemoglobin: 12.7 — ABNORMAL LOW
Hemoglobin: 12.7 — ABNORMAL LOW
Hemoglobin: 12.8 — ABNORMAL LOW
Hemoglobin: 13
MCHC: 33.5
MCHC: 33.7
MCHC: 33.8
MCHC: 33.8
MCHC: 33.9
MCHC: 33.9
MCHC: 33.9
MCHC: 34
MCV: 97.1
MCV: 97.5
MCV: 97.8
MCV: 98.1
MCV: 98.4
MCV: 98.5
MCV: 99.1
MCV: 99.4
Platelets: 222
Platelets: 228
Platelets: 230
Platelets: 236
Platelets: 256
Platelets: 265
Platelets: 279
Platelets: 287
RBC: 3.7 — ABNORMAL LOW
RBC: 3.77 — ABNORMAL LOW
RBC: 3.78 — ABNORMAL LOW
RBC: 3.79 — ABNORMAL LOW
RBC: 3.81 — ABNORMAL LOW
RBC: 3.82 — ABNORMAL LOW
RBC: 3.83 — ABNORMAL LOW
RBC: 3.91 — ABNORMAL LOW
RDW: 14.2 — ABNORMAL HIGH
RDW: 14.4 — ABNORMAL HIGH
RDW: 14.5 — ABNORMAL HIGH
RDW: 14.5 — ABNORMAL HIGH
RDW: 14.6 — ABNORMAL HIGH
RDW: 14.6 — ABNORMAL HIGH
RDW: 14.6 — ABNORMAL HIGH
RDW: 14.7 — ABNORMAL HIGH
WBC: 10.2
WBC: 6.4
WBC: 7
WBC: 8.2
WBC: 8.6
WBC: 9
WBC: 9
WBC: 9.6

## 2010-12-24 LAB — CARDIAC PANEL(CRET KIN+CKTOT+MB+TROPI)
CK, MB: 1.2
CK, MB: 1.8
CK, MB: 1.8
Relative Index: INVALID
Relative Index: INVALID
Relative Index: INVALID
Total CK: 72
Total CK: 83
Total CK: 88
Troponin I: 0.01
Troponin I: 0.02
Troponin I: 0.03

## 2010-12-24 LAB — PROTIME-INR
INR: 1
INR: 1
INR: 1.1
INR: 1.1
INR: 1.2
INR: 1.5
INR: 1.8 — ABNORMAL HIGH
INR: 2.2 — ABNORMAL HIGH
Prothrombin Time: 13.2
Prothrombin Time: 13.3
Prothrombin Time: 14.1
Prothrombin Time: 14.8
Prothrombin Time: 15.8 — ABNORMAL HIGH
Prothrombin Time: 18.4 — ABNORMAL HIGH
Prothrombin Time: 21.8 — ABNORMAL HIGH
Prothrombin Time: 24.9 — ABNORMAL HIGH

## 2010-12-24 LAB — BASIC METABOLIC PANEL
BUN: 8
BUN: 9
CO2: 28
CO2: 29
Calcium: 8.9
Calcium: 8.9
Chloride: 104
Chloride: 108
Creatinine, Ser: 0.81
Creatinine, Ser: 0.88
GFR calc Af Amer: >60
GFR calc Af Amer: >60
GFR calc non Af Amer: >60
GFR calc non Af Amer: >60
Glucose, Bld: 178 — ABNORMAL HIGH
Glucose, Bld: 183 — ABNORMAL HIGH
Potassium: 3.8
Potassium: 4.4
Sodium: 138
Sodium: 141

## 2010-12-24 LAB — COMPREHENSIVE METABOLIC PANEL
ALT: 32
AST: 19
Albumin: 3.2 — ABNORMAL LOW
Alkaline Phosphatase: 43
BUN: 8
CO2: 26
Calcium: 8.6
Chloride: 104
Creatinine, Ser: 0.82
GFR calc Af Amer: >60
GFR calc non Af Amer: >60
Glucose, Bld: 145 — ABNORMAL HIGH
Potassium: 3.4 — ABNORMAL LOW
Sodium: 138
Total Bilirubin: 0.6
Total Protein: 5.7 — ABNORMAL LOW

## 2010-12-24 LAB — DIFFERENTIAL
Basophils Absolute: 0
Basophils Relative: 1
Eosinophils Absolute: 0.1
Eosinophils Relative: 2
Lymphocytes Relative: 46
Lymphs Abs: 3.2
Monocytes Absolute: 0.6
Monocytes Relative: 8
Neutro Abs: 3
Neutrophils Relative %: 42 — ABNORMAL LOW

## 2010-12-24 LAB — HEPARIN LEVEL (UNFRACTIONATED)
Heparin Unfractionated: 0.33
Heparin Unfractionated: 0.34
Heparin Unfractionated: 0.36
Heparin Unfractionated: 0.36
Heparin Unfractionated: 0.48
Heparin Unfractionated: 0.66
Heparin Unfractionated: <0.1 — ABNORMAL LOW
Heparin Unfractionated: <0.1 — ABNORMAL LOW

## 2010-12-24 LAB — APTT: aPTT: 35

## 2010-12-24 LAB — LIPID PANEL
Cholesterol: 135
HDL: 31 — ABNORMAL LOW
LDL Cholesterol: 75
Total CHOL/HDL Ratio: 4.4
Triglycerides: 145
VLDL: 29

## 2010-12-24 LAB — TROPONIN I: Troponin I: 0.03

## 2010-12-24 LAB — MAGNESIUM: Magnesium: 2

## 2010-12-24 LAB — CK TOTAL AND CKMB (NOT AT ARMC)
CK, MB: 2.1
Relative Index: 2
Total CK: 105

## 2010-12-24 LAB — TSH: TSH: 0.521

## 2010-12-25 LAB — BETA-2-GLYCOPROTEIN I ABS, IGG/M/A
Beta-2 Glyco I IgG: <4 U/mL (ref <20)
Beta-2-Glycoprotein I IgA: 5 U/mL (ref <10)
Beta-2-Glycoprotein I IgM: <4 U/mL (ref <10)

## 2010-12-25 LAB — LIPID PANEL
Cholesterol: 150
HDL: 29 — ABNORMAL LOW
LDL Cholesterol: 75
Total CHOL/HDL Ratio: 5.2
Triglycerides: 229 — ABNORMAL HIGH
VLDL: 46 — ABNORMAL HIGH

## 2010-12-25 LAB — BASIC METABOLIC PANEL
BUN: 6
BUN: 8
BUN: 9
CO2: 25
CO2: 26
CO2: 29
Calcium: 8.3 — ABNORMAL LOW
Calcium: 8.4
Calcium: 8.8
Chloride: 103
Chloride: 106
Chloride: 106
Creatinine, Ser: 0.81
Creatinine, Ser: 0.88
Creatinine, Ser: 0.9
GFR calc Af Amer: >60
GFR calc Af Amer: >60
GFR calc Af Amer: >60
GFR calc non Af Amer: >60
GFR calc non Af Amer: >60
GFR calc non Af Amer: >60
Glucose, Bld: 117 — ABNORMAL HIGH
Glucose, Bld: 138 — ABNORMAL HIGH
Glucose, Bld: 169 — ABNORMAL HIGH
Potassium: 3.2 — ABNORMAL LOW
Potassium: 3.7
Potassium: 3.9
Sodium: 135
Sodium: 137
Sodium: 138

## 2010-12-25 LAB — DIFFERENTIAL
Basophils Absolute: 0
Basophils Relative: 0
Eosinophils Absolute: 0.1
Eosinophils Relative: 1
Lymphocytes Relative: 26
Lymphs Abs: 2.7
Monocytes Absolute: 0.7
Monocytes Relative: 7
Neutro Abs: 6.8
Neutrophils Relative %: 66

## 2010-12-25 LAB — COMPREHENSIVE METABOLIC PANEL
ALT: 37
AST: 28
Albumin: 3.2 — ABNORMAL LOW
Alkaline Phosphatase: 43
BUN: 10
CO2: 27
Calcium: 8.5
Chloride: 106
Creatinine, Ser: 1.04
GFR calc Af Amer: >60
GFR calc non Af Amer: >60
Glucose, Bld: 130 — ABNORMAL HIGH
Potassium: 4.2
Sodium: 137
Total Bilirubin: 0.7
Total Protein: 5.6 — ABNORMAL LOW

## 2010-12-25 LAB — I-STAT 8, (EC8 V) (CONVERTED LAB)
Acid-base deficit: 2
BUN: 12
Bicarbonate: 22.5
Chloride: 107
Glucose, Bld: 190 — ABNORMAL HIGH
HCT: 46
Hemoglobin: 15.6
Operator id: 265201
Potassium: 4.1
Sodium: 139
TCO2: 24
pCO2, Ven: 37.7 — ABNORMAL LOW
pH, Ven: 7.384 — ABNORMAL HIGH

## 2010-12-25 LAB — CK TOTAL AND CKMB (NOT AT ARMC)
CK, MB: 3.1
CK, MB: 36.4 — ABNORMAL HIGH
Relative Index: 7.3 — ABNORMAL HIGH
Relative Index: INVALID
Total CK: 498 — ABNORMAL HIGH
Total CK: 90

## 2010-12-25 LAB — CBC
HCT: 35.1 — ABNORMAL LOW
HCT: 36.1 — ABNORMAL LOW
HCT: 36.7 — ABNORMAL LOW
HCT: 36.7 — ABNORMAL LOW
Hemoglobin: 11.8 — ABNORMAL LOW
Hemoglobin: 12.2 — ABNORMAL LOW
Hemoglobin: 12.3 — ABNORMAL LOW
Hemoglobin: 12.7 — ABNORMAL LOW
MCHC: 33.5
MCHC: 33.6
MCHC: 33.7
MCHC: 34.6
MCV: 97.1
MCV: 98.1
MCV: 98.7
MCV: 99.3
Platelets: 165
Platelets: 188
Platelets: 201
Platelets: 218
RBC: 3.58 — ABNORMAL LOW
RBC: 3.66 — ABNORMAL LOW
RBC: 3.69 — ABNORMAL LOW
RBC: 3.78 — ABNORMAL LOW
RDW: 13.9
RDW: 14
RDW: 14.1 — ABNORMAL HIGH
RDW: 14.4 — ABNORMAL HIGH
WBC: 10.3
WBC: 6.6
WBC: 7.7
WBC: 9

## 2010-12-25 LAB — PROTEIN S, TOTAL: Protein S Ag, Total: 186 % — ABNORMAL HIGH (ref 70–140)

## 2010-12-25 LAB — GLYCOHEMOGLOBIN, TOTAL: Hemoglobin-A1c: 8.6 % — ABNORMAL HIGH (ref <6.0)

## 2010-12-25 LAB — IGA: IgA: 256

## 2010-12-25 LAB — PROTHROMBIN GENE MUTATION

## 2010-12-25 LAB — LUPUS ANTICOAGULANT PANEL
DRVVT: 38.4 (ref 36.1–47.0)
Lupus Anticoagulant: NOT DETECTED
PTT Lupus Anticoagulant: 45.8 (ref 36.3–48.8)

## 2010-12-25 LAB — IGM: IgM, Serum: 69

## 2010-12-25 LAB — PROTEIN C ACTIVITY: Protein C Activity: 169 % — ABNORMAL HIGH (ref 75–133)

## 2010-12-25 LAB — PROTEIN S ACTIVITY: Protein S Activity: 137 % — ABNORMAL HIGH (ref 69–129)

## 2010-12-25 LAB — CARDIAC PANEL(CRET KIN+CKTOT+MB+TROPI)
CK, MB: 49 — ABNORMAL HIGH
CK, MB: 81 — ABNORMAL HIGH
CK, MB: 84.7 — ABNORMAL HIGH
Relative Index: 13.7 — ABNORMAL HIGH
Relative Index: 16.7 — ABNORMAL HIGH
Relative Index: 17.1 — ABNORMAL HIGH
Total CK: 286 — ABNORMAL HIGH
Total CK: 508 — ABNORMAL HIGH
Total CK: 593 — ABNORMAL HIGH
Troponin I: 2.86
Troponin I: 8.56
Troponin I: 9.8

## 2010-12-25 LAB — CARDIOLIPIN ANTIBODIES, IGG, IGM, IGA
Anticardiolipin IgA: 11 — ABNORMAL LOW (ref <13)
Anticardiolipin IgG: 7 — ABNORMAL LOW (ref <11)
Anticardiolipin IgM: <7 — ABNORMAL LOW (ref <10)

## 2010-12-25 LAB — POCT CARDIAC MARKERS
CKMB, poc: 1.6
Myoglobin, poc: 91.9
Operator id: 265201
Troponin i, poc: <0.05

## 2010-12-25 LAB — FACTOR 5 LEIDEN

## 2010-12-25 LAB — MAGNESIUM: Magnesium: 1.8

## 2010-12-25 LAB — POCT I-STAT CREATININE
Creatinine, Ser: 0.9
Operator id: 265201

## 2010-12-25 LAB — HEPARIN LEVEL (UNFRACTIONATED)
Heparin Unfractionated: 0.19 — ABNORMAL LOW
Heparin Unfractionated: 0.34
Heparin Unfractionated: <0.1 — ABNORMAL LOW

## 2010-12-25 LAB — HOMOCYSTEINE: Homocysteine: 15.3

## 2010-12-25 LAB — IGG: IgG (Immunoglobin G), Serum: 857

## 2010-12-25 LAB — PROTEIN C, TOTAL: Protein C, Total: 108 % (ref 70–140)

## 2010-12-25 LAB — TSH: TSH: 0.354

## 2010-12-25 LAB — TROPONIN I
Troponin I: 0.03
Troponin I: 7.56

## 2010-12-25 LAB — D-DIMER, QUANTITATIVE: D-Dimer, Quant: <0.22

## 2010-12-25 LAB — ANTITHROMBIN III: AntiThromb III Func: 93 (ref 76–126)

## 2010-12-26 LAB — DIFFERENTIAL
Basophils Absolute: 0
Basophils Absolute: 0.1
Basophils Relative: 0
Basophils Relative: 1
Eosinophils Absolute: 0.1
Eosinophils Absolute: 0.2
Eosinophils Relative: 2
Eosinophils Relative: 3
Lymphocytes Relative: 40
Lymphocytes Relative: 51 — ABNORMAL HIGH
Lymphs Abs: 2.9
Lymphs Abs: 3.6 — ABNORMAL HIGH
Monocytes Absolute: 0.6
Monocytes Absolute: 0.6
Monocytes Relative: 8
Monocytes Relative: 9
Neutro Abs: 2.7
Neutro Abs: 3.5
Neutrophils Relative %: 38 — ABNORMAL LOW
Neutrophils Relative %: 49

## 2010-12-26 LAB — LIPID PANEL
Cholesterol: 183
HDL: 29 — ABNORMAL LOW
LDL Cholesterol: 83
Total CHOL/HDL Ratio: 6.3
Triglycerides: 357 — ABNORMAL HIGH
VLDL: 71 — ABNORMAL HIGH

## 2010-12-26 LAB — CBC
HCT: 38.5 — ABNORMAL LOW
HCT: 38.5 — ABNORMAL LOW
Hemoglobin: 13.1
Hemoglobin: 13.2
MCHC: 34.1
MCHC: 34.3
MCV: 98.1
MCV: 98.5
Platelets: 194
Platelets: 219
RBC: 3.9 — ABNORMAL LOW
RBC: 3.92 — ABNORMAL LOW
RDW: 13.8
RDW: 14.1 — ABNORMAL HIGH
WBC: 7.1
WBC: 7.1

## 2010-12-26 LAB — COMPREHENSIVE METABOLIC PANEL
ALT: 31
AST: 22
Albumin: 2.8 — ABNORMAL LOW
Alkaline Phosphatase: 43
BUN: 11
CO2: 28
Calcium: 8.6
Chloride: 108
Creatinine, Ser: 0.79
GFR calc Af Amer: >60
GFR calc non Af Amer: >60
Glucose, Bld: 145 — ABNORMAL HIGH
Potassium: 3.8
Sodium: 140
Total Bilirubin: 0.5
Total Protein: 5.2 — ABNORMAL LOW

## 2010-12-26 LAB — BASIC METABOLIC PANEL
BUN: 14
CO2: 30
Calcium: 8.6
Chloride: 106
Creatinine, Ser: 0.86
GFR calc Af Amer: >60
GFR calc non Af Amer: >60
Glucose, Bld: 207 — ABNORMAL HIGH
Potassium: 4.1
Sodium: 140

## 2010-12-26 LAB — D-DIMER, QUANTITATIVE: D-Dimer, Quant: 0.28

## 2010-12-26 LAB — CK TOTAL AND CKMB (NOT AT ARMC)
CK, MB: 1.9
Relative Index: 1.4
Total CK: 139

## 2010-12-26 LAB — CARDIAC PANEL(CRET KIN+CKTOT+MB+TROPI)
CK, MB: 2
CK, MB: 2.4
Relative Index: 1.6
Relative Index: 2.3
Total CK: 104
Total CK: 122
Troponin I: 0.02
Troponin I: 0.02

## 2010-12-26 LAB — TROPONIN I: Troponin I: 0.02

## 2011-03-30 DIAGNOSIS — I4891 Unspecified atrial fibrillation: Secondary | ICD-10-CM | POA: Diagnosis not present

## 2011-03-30 DIAGNOSIS — I472 Ventricular tachycardia: Secondary | ICD-10-CM | POA: Diagnosis not present

## 2011-04-02 DIAGNOSIS — E785 Hyperlipidemia, unspecified: Secondary | ICD-10-CM | POA: Diagnosis not present

## 2011-04-02 DIAGNOSIS — Z6825 Body mass index (BMI) 25.0-25.9, adult: Secondary | ICD-10-CM | POA: Diagnosis not present

## 2011-04-02 DIAGNOSIS — E119 Type 2 diabetes mellitus without complications: Secondary | ICD-10-CM | POA: Diagnosis not present

## 2011-04-02 DIAGNOSIS — F411 Generalized anxiety disorder: Secondary | ICD-10-CM | POA: Diagnosis not present

## 2011-04-25 DIAGNOSIS — I472 Ventricular tachycardia: Secondary | ICD-10-CM | POA: Diagnosis not present

## 2011-04-25 DIAGNOSIS — I4891 Unspecified atrial fibrillation: Secondary | ICD-10-CM | POA: Diagnosis not present

## 2011-05-27 DIAGNOSIS — H524 Presbyopia: Secondary | ICD-10-CM | POA: Diagnosis not present

## 2011-05-27 DIAGNOSIS — E119 Type 2 diabetes mellitus without complications: Secondary | ICD-10-CM | POA: Diagnosis not present

## 2011-05-27 DIAGNOSIS — H5200 Hypermetropia, unspecified eye: Secondary | ICD-10-CM | POA: Diagnosis not present

## 2011-05-27 DIAGNOSIS — H2510 Age-related nuclear cataract, unspecified eye: Secondary | ICD-10-CM | POA: Diagnosis not present

## 2011-07-07 DIAGNOSIS — M48061 Spinal stenosis, lumbar region without neurogenic claudication: Secondary | ICD-10-CM | POA: Diagnosis not present

## 2011-08-03 DIAGNOSIS — I4891 Unspecified atrial fibrillation: Secondary | ICD-10-CM | POA: Diagnosis not present

## 2011-08-03 DIAGNOSIS — I472 Ventricular tachycardia: Secondary | ICD-10-CM | POA: Diagnosis not present

## 2011-08-26 DIAGNOSIS — E119 Type 2 diabetes mellitus without complications: Secondary | ICD-10-CM | POA: Diagnosis not present

## 2011-08-26 DIAGNOSIS — I2510 Atherosclerotic heart disease of native coronary artery without angina pectoris: Secondary | ICD-10-CM | POA: Diagnosis not present

## 2011-08-26 DIAGNOSIS — I4891 Unspecified atrial fibrillation: Secondary | ICD-10-CM | POA: Diagnosis not present

## 2011-08-26 DIAGNOSIS — I10 Essential (primary) hypertension: Secondary | ICD-10-CM | POA: Diagnosis not present

## 2011-08-27 DIAGNOSIS — Z79899 Other long term (current) drug therapy: Secondary | ICD-10-CM | POA: Diagnosis not present

## 2011-08-27 DIAGNOSIS — E782 Mixed hyperlipidemia: Secondary | ICD-10-CM | POA: Diagnosis not present

## 2011-10-21 DIAGNOSIS — Z4502 Encounter for adjustment and management of automatic implantable cardiac defibrillator: Secondary | ICD-10-CM | POA: Diagnosis not present

## 2011-10-21 DIAGNOSIS — I4891 Unspecified atrial fibrillation: Secondary | ICD-10-CM | POA: Diagnosis not present

## 2011-10-21 DIAGNOSIS — I472 Ventricular tachycardia: Secondary | ICD-10-CM | POA: Diagnosis not present

## 2012-01-12 DIAGNOSIS — L253 Unspecified contact dermatitis due to other chemical products: Secondary | ICD-10-CM | POA: Diagnosis not present

## 2012-01-12 DIAGNOSIS — Z6828 Body mass index (BMI) 28.0-28.9, adult: Secondary | ICD-10-CM | POA: Diagnosis not present

## 2012-01-12 DIAGNOSIS — I10 Essential (primary) hypertension: Secondary | ICD-10-CM | POA: Diagnosis not present

## 2012-01-12 DIAGNOSIS — Z0000 Encounter for general adult medical examination without abnormal findings: Secondary | ICD-10-CM | POA: Diagnosis not present

## 2012-01-12 DIAGNOSIS — E785 Hyperlipidemia, unspecified: Secondary | ICD-10-CM | POA: Diagnosis not present

## 2012-01-14 DIAGNOSIS — I6529 Occlusion and stenosis of unspecified carotid artery: Secondary | ICD-10-CM | POA: Diagnosis not present

## 2012-01-14 DIAGNOSIS — I70219 Atherosclerosis of native arteries of extremities with intermittent claudication, unspecified extremity: Secondary | ICD-10-CM | POA: Diagnosis not present

## 2012-01-15 DIAGNOSIS — I10 Essential (primary) hypertension: Secondary | ICD-10-CM | POA: Diagnosis not present

## 2012-01-15 DIAGNOSIS — D539 Nutritional anemia, unspecified: Secondary | ICD-10-CM | POA: Diagnosis not present

## 2012-01-15 DIAGNOSIS — G8929 Other chronic pain: Secondary | ICD-10-CM | POA: Diagnosis not present

## 2012-01-15 DIAGNOSIS — Z125 Encounter for screening for malignant neoplasm of prostate: Secondary | ICD-10-CM | POA: Diagnosis not present

## 2012-01-15 DIAGNOSIS — E119 Type 2 diabetes mellitus without complications: Secondary | ICD-10-CM | POA: Diagnosis not present

## 2012-01-15 DIAGNOSIS — E785 Hyperlipidemia, unspecified: Secondary | ICD-10-CM | POA: Diagnosis not present

## 2012-01-15 DIAGNOSIS — Z0000 Encounter for general adult medical examination without abnormal findings: Secondary | ICD-10-CM | POA: Diagnosis not present

## 2012-01-21 DIAGNOSIS — Z0000 Encounter for general adult medical examination without abnormal findings: Secondary | ICD-10-CM | POA: Diagnosis not present

## 2012-02-26 ENCOUNTER — Other Ambulatory Visit (HOSPITAL_COMMUNITY): Payer: Self-pay | Admitting: Cardiovascular Disease

## 2012-02-26 DIAGNOSIS — I2510 Atherosclerotic heart disease of native coronary artery without angina pectoris: Secondary | ICD-10-CM

## 2012-03-03 ENCOUNTER — Encounter (HOSPITAL_COMMUNITY): Payer: Medicare Other

## 2012-03-17 ENCOUNTER — Ambulatory Visit (HOSPITAL_COMMUNITY)
Admission: RE | Admit: 2012-03-17 | Discharge: 2012-03-17 | Disposition: A | Discharge status: Home / Self Care | Payer: Medicare Other | Source: Ambulatory Visit | Attending: Cardiovascular Disease | Admitting: Cardiovascular Disease

## 2012-03-17 DIAGNOSIS — I2510 Atherosclerotic heart disease of native coronary artery without angina pectoris: Secondary | ICD-10-CM | POA: Diagnosis not present

## 2012-03-17 MED ORDER — TECHNETIUM TC 99M SESTAMIBI GENERIC - CARDIOLITE
10.4000 | Freq: Once | INTRAVENOUS | Status: AC | PRN
  Administered 2012-03-17: 10 via INTRAVENOUS

## 2012-03-17 MED ORDER — REGADENOSON 0.4 MG/5ML IV SOLN
0.4000 mg | Freq: Once | INTRAVENOUS | Status: AC
  Administered 2012-03-17: 0.4 mg via INTRAVENOUS

## 2012-03-17 MED ORDER — TECHNETIUM TC 99M SESTAMIBI GENERIC - CARDIOLITE
31.2000 | Freq: Once | INTRAVENOUS | Status: AC | PRN
  Administered 2012-03-17: 31.2 via INTRAVENOUS

## 2012-03-17 NOTE — Procedures (Addendum)
[facility] [address] [place] [address] [place] [address] [phone]  Cardiology Nuclear Med Study  Alan Orr is a 67 y.o. male     MRN : [id number]     DOB: 05-31-1945  Procedure Date: 03/17/2012  Nuclear Med Background Indication for Stress Test:  Graft Patency History:  MI 1997, CABGx4 1997 Cardiac Risk Factors: Family History - CAD, History of Smoking, Hypertension, Lipids and NIDDM  Symptoms:  none   Nuclear Pre-Procedure Caffeine/Decaff Intake:  12:00am NPO After: 11:00am   IV Site: R Antecubital  IV 0.9% NS with Angio Cath:  22g  Chest Size (in):  44 IV Started by: Koren Shiver, CNMT  Height: 6\' 2"  (1.88 m)  Cup Size: n/a  BMI:  Body mass index is 26.96 kg/(m^2). Weight:  210 lb (95.255 kg)   Tech Comments:  n/a    Nuclear Med Study 1 or 2 day study: 1 day  Stress Test Type:  Lexiscan  Order Authorizing Provider:  Nanetta Batty, MD   Resting Radionuclide: Technetium 9m Sestamibi  Resting Radionuclide Dose: 10.4 mCi   Stress Radionuclide:  Technetium 12m Sestamibi  Stress Radionuclide Dose: 31.2 mCi           Stress Protocol Rest HR: 51 Stess HR:  50  Rest BP: 142/66 Stress BP: 147/48  Exercise Time (min): n/a METS: n/a   Predicted Max HR: 153 bpm % Max HR: 32.68 bpm Rate Pressure Product: 7350   Dose of Adenosine (mg):  n/a Dose of Lexiscan: 0.4 mg  Dose of Atropine (mg): n/a Dose of Dobutamine: n/a mcg/kg/min (at max HR)  Stress Test Technologist: Esperanza Sheets, CCT Nuclear Technologist: Gonzella Lex, CNMT   Rest Procedure:  Myocardial perfusion imaging was performed at rest 45 minutes following the intravenous administration of Technetium 29m Sestamibi. Stress Procedure:  The patient received IV Lexiscan 0.4 mg over 15-seconds.  Technetium 3m Sestamibi injected at 30-seconds.  There were no significant changes with Lexiscan.  Quantitative spect images were obtained after a 45 minute  delay.  Transient Ischemic Dilatation (Normal <1.22):  0.99 Lung/Heart Ratio (Normal <0.45):  0.26 QGS EDV:  161 ml QGS ESV:  93 ml LV Ejection Fraction: 42%  Signed by .     Rest ECG: V-paced rhythm  Stress ECG: No significant change from baseline ECG; AV pacing noted  QPS Raw Data Images:  Normal; no motion artifact; normal heart/lung ratio. Stress Images:  Large in size, severe in intensity defect involving the apex, distal, mid  anterior, septal and inferior walls  extending to basal inferoseptal region. Rest Images:  Comparison with the stress images reveals no significant change. Subtraction (SDS):  Large area of irreversible scar without ischemia.  Impression Exercise Capacity:  Lexiscan with no exercise. BP Response:  Normal blood pressure response. Clinical Symptoms:  No significant symptoms noted. ECG Impression:  ECG is uninterpretable for ischemia due to paced rhythm Comparison with Prior Nuclear Study: No images to compare  Overall Impression:  Intermediate stress nuclear study demonstrating a large region of scar (extent 34%) in the LAD and RCA territory without associated ischemia.  LV Wall Motion:  Moderate LV dysfunction EF42% with akinesis of apex and hypocontractility of mid-distal anterolateral and inferior walls.   Lennette Bihari, MD  03/17/2012 5:47 PM

## 2012-03-21 DIAGNOSIS — I2510 Atherosclerotic heart disease of native coronary artery without angina pectoris: Secondary | ICD-10-CM | POA: Diagnosis not present

## 2012-03-21 DIAGNOSIS — F411 Generalized anxiety disorder: Secondary | ICD-10-CM | POA: Diagnosis not present

## 2012-03-21 DIAGNOSIS — G8929 Other chronic pain: Secondary | ICD-10-CM | POA: Diagnosis not present

## 2012-03-21 DIAGNOSIS — I10 Essential (primary) hypertension: Secondary | ICD-10-CM | POA: Diagnosis not present

## 2012-03-21 DIAGNOSIS — Z6827 Body mass index (BMI) 27.0-27.9, adult: Secondary | ICD-10-CM | POA: Diagnosis not present

## 2012-04-08 DIAGNOSIS — E782 Mixed hyperlipidemia: Secondary | ICD-10-CM | POA: Diagnosis not present

## 2012-04-08 DIAGNOSIS — E119 Type 2 diabetes mellitus without complications: Secondary | ICD-10-CM | POA: Diagnosis not present

## 2012-04-08 DIAGNOSIS — I2510 Atherosclerotic heart disease of native coronary artery without angina pectoris: Secondary | ICD-10-CM | POA: Diagnosis not present

## 2012-04-08 DIAGNOSIS — I10 Essential (primary) hypertension: Secondary | ICD-10-CM | POA: Diagnosis not present

## 2012-04-11 DIAGNOSIS — I472 Ventricular tachycardia: Secondary | ICD-10-CM | POA: Diagnosis not present

## 2012-04-11 DIAGNOSIS — I4891 Unspecified atrial fibrillation: Secondary | ICD-10-CM | POA: Diagnosis not present

## 2012-05-15 DIAGNOSIS — I509 Heart failure, unspecified: Secondary | ICD-10-CM | POA: Diagnosis not present

## 2012-05-15 DIAGNOSIS — I2589 Other forms of chronic ischemic heart disease: Secondary | ICD-10-CM | POA: Diagnosis not present

## 2012-06-19 DIAGNOSIS — I2589 Other forms of chronic ischemic heart disease: Secondary | ICD-10-CM | POA: Diagnosis not present

## 2012-06-19 DIAGNOSIS — I472 Ventricular tachycardia: Secondary | ICD-10-CM | POA: Diagnosis not present

## 2012-06-19 DIAGNOSIS — I509 Heart failure, unspecified: Secondary | ICD-10-CM | POA: Diagnosis not present

## 2012-06-19 LAB — ICD DEVICE OBSERVATION

## 2012-08-02 ENCOUNTER — Encounter: Payer: Self-pay | Admitting: *Deleted

## 2012-08-21 DIAGNOSIS — I472 Ventricular tachycardia: Secondary | ICD-10-CM

## 2012-08-21 LAB — ICD DEVICE OBSERVATION

## 2012-08-31 DIAGNOSIS — Z79899 Other long term (current) drug therapy: Secondary | ICD-10-CM | POA: Diagnosis not present

## 2012-08-31 DIAGNOSIS — I2510 Atherosclerotic heart disease of native coronary artery without angina pectoris: Secondary | ICD-10-CM | POA: Diagnosis not present

## 2012-08-31 DIAGNOSIS — I10 Essential (primary) hypertension: Secondary | ICD-10-CM | POA: Diagnosis not present

## 2012-08-31 DIAGNOSIS — Z6826 Body mass index (BMI) 26.0-26.9, adult: Secondary | ICD-10-CM | POA: Diagnosis not present

## 2012-08-31 DIAGNOSIS — E1129 Type 2 diabetes mellitus with other diabetic kidney complication: Secondary | ICD-10-CM | POA: Diagnosis not present

## 2012-09-06 ENCOUNTER — Encounter: Payer: Self-pay | Admitting: *Deleted

## 2012-09-25 ENCOUNTER — Other Ambulatory Visit: Payer: Self-pay | Admitting: Cardiovascular Disease

## 2012-09-25 DIAGNOSIS — I472 Ventricular tachycardia: Secondary | ICD-10-CM | POA: Diagnosis not present

## 2012-09-25 LAB — ICD DEVICE OBSERVATION

## 2012-10-03 ENCOUNTER — Encounter: Payer: Self-pay | Admitting: *Deleted

## 2012-10-03 LAB — REMOTE ICD DEVICE
AL AMPLITUDE: 1.3 mv
AL IMPEDENCE ICD: 589 Ohm
BATTERY VOLTAGE: 3.04 V
BRDY-0002LV: 50 {beats}/min
BRDY-0003LV: 130 {beats}/min
MODE SWITCH EPISODES: 0
PACEART VT: 0
TZAT-0001FASTVT: 1
TZAT-0001FASTVT: 2
TZAT-0004FASTVT: 8
TZAT-0004SLOWVT: 8
TZAT-0005FASTVT: 88 pct
TZAT-0005FASTVT: 91 pct
TZAT-0005SLOWVT: 88 pct
TZAT-0005SLOWVT: 91 pct
TZAT-0011FASTVT: 10 ms
TZAT-0011SLOWVT: 10 ms
TZAT-0011SLOWVT: 10 ms
TZAT-0013SLOWVT: 4
TZAT-0018SLOWVT: NEGATIVE
TZON-0003AFLUTTER: 350.8 ms
TZON-0003FASTVT: 250 ms
TZST-0001FASTVT: 6
TZST-0001SLOWVT: 3
TZST-0001SLOWVT: 6
TZST-0003FASTVT: 35 J
TZST-0003FASTVT: 35 J
TZST-0003SLOWVT: 35 J
VF: 0

## 2012-10-08 ENCOUNTER — Encounter: Payer: Self-pay | Admitting: Cardiovascular Disease

## 2012-10-19 ENCOUNTER — Encounter: Payer: Medicare Other | Admitting: Cardiovascular Disease

## 2012-10-21 ENCOUNTER — Other Ambulatory Visit: Payer: Self-pay | Admitting: Cardiovascular Disease

## 2012-10-23 ENCOUNTER — Encounter: Payer: Self-pay | Admitting: *Deleted

## 2012-10-25 NOTE — Telephone Encounter (Signed)
Rx was sent to pharmacy electronically. 

## 2012-10-26 ENCOUNTER — Other Ambulatory Visit: Payer: Self-pay | Admitting: *Deleted

## 2012-10-27 ENCOUNTER — Ambulatory Visit (INDEPENDENT_AMBULATORY_CARE_PROVIDER_SITE_OTHER): Payer: Medicare Other | Admitting: Cardiovascular Disease

## 2012-10-27 ENCOUNTER — Encounter: Payer: Self-pay | Admitting: Cardiovascular Disease

## 2012-10-27 VITALS — BP 134/60 | HR 69 | Ht 74.0 in | Wt 215.0 lb

## 2012-10-27 DIAGNOSIS — I255 Ischemic cardiomyopathy: Secondary | ICD-10-CM

## 2012-10-27 DIAGNOSIS — I2510 Atherosclerotic heart disease of native coronary artery without angina pectoris: Secondary | ICD-10-CM | POA: Diagnosis not present

## 2012-10-27 DIAGNOSIS — I5022 Chronic systolic (congestive) heart failure: Secondary | ICD-10-CM

## 2012-10-27 DIAGNOSIS — I480 Paroxysmal atrial fibrillation: Secondary | ICD-10-CM

## 2012-10-27 DIAGNOSIS — I472 Ventricular tachycardia: Secondary | ICD-10-CM

## 2012-10-27 DIAGNOSIS — E785 Hyperlipidemia, unspecified: Secondary | ICD-10-CM

## 2012-10-27 DIAGNOSIS — Z95810 Presence of automatic (implantable) cardiac defibrillator: Secondary | ICD-10-CM

## 2012-10-27 DIAGNOSIS — I2589 Other forms of chronic ischemic heart disease: Secondary | ICD-10-CM

## 2012-10-27 DIAGNOSIS — I4891 Unspecified atrial fibrillation: Secondary | ICD-10-CM

## 2012-10-27 NOTE — Patient Instructions (Addendum)
Your physician recommends that you schedule a follow-up appointment in: 6 months  

## 2012-10-27 NOTE — Progress Notes (Deleted)
Your physician recommends that you schedule a follow-up appointment in: 6 months  

## 2012-11-08 DIAGNOSIS — I480 Paroxysmal atrial fibrillation: Secondary | ICD-10-CM | POA: Insufficient documentation

## 2012-11-08 DIAGNOSIS — I255 Ischemic cardiomyopathy: Secondary | ICD-10-CM | POA: Insufficient documentation

## 2012-11-08 DIAGNOSIS — I472 Ventricular tachycardia: Secondary | ICD-10-CM | POA: Insufficient documentation

## 2012-11-08 DIAGNOSIS — E785 Hyperlipidemia, unspecified: Secondary | ICD-10-CM | POA: Insufficient documentation

## 2012-11-08 DIAGNOSIS — I2510 Atherosclerotic heart disease of native coronary artery without angina pectoris: Secondary | ICD-10-CM | POA: Insufficient documentation

## 2012-11-08 NOTE — Assessment & Plan Note (Signed)
He is free of angina. He has extensive scar tissue and the distribution of the LAD artery and right coronary artery. It has been 17 years since his bypass procedure. No reversible ischemia.

## 2012-11-08 NOTE — Assessment & Plan Note (Addendum)
Normal recent remote device check 7/13. His optivol thoracic impedance monitor does not suggest hypervolemia. Good biventricular pacing efficiency (virtually 100%.

## 2012-11-08 NOTE — Assessment & Plan Note (Signed)
He appears to be clinically euvolemic. He is on appropriate treatment with ACE inhibitors and beta blockers, although the dose of ACE inhibitor in particular is rather low. Previously had had problems with low blood pressure. Left ventricular ejection fraction estimated at 42% by nuclear scintigraphy and 30-35% by echocardiography most recently performed in 2011 [employer] [place] Heart Association functional class 2-3

## 2012-11-08 NOTE — Assessment & Plan Note (Signed)
There have been no episodes of either sustained or nonsustained VT since his last device check. Roughly a year ago he had an episode of "slow VT" that did not require therapy. 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 He has multiple sequences of antitachycardia therapy programmed in the VT and fast VT zone because of this.

## 2012-11-08 NOTE — Assessment & Plan Note (Signed)
No clinically evident episodes. No atrophic collection detected by his defibrillator since the last check.

## 2012-11-08 NOTE — Progress Notes (Signed)
Patient ID: Alan Orr, male   DOB: 1945-06-22, 67 y.o.   MRN: [id number]      Reason for office visit Paroxysmal atrial fibrillation, history of ventricular tachycardia, defibrillator follow up  [name] is a 67 year old gentleman with a long-standing history of moderate ischemic cardiomyopathy (ejection fraction around 35%), compensated congestive heart failure (NYHA functional class II), coronary artery disease (status post bypass surgery 1997 with patent LIMA to LAD, SVG to diagonal/ramus, SVG to OM and SVG to PDA by cardiac catheterization 2011 - possible occlusion of the sequential limb to the PLA), history of paroxysmal atrial fibrillation, history of ventricular tachycardia storm, status post defibrillator/biventricular pacemaker (Medtronic Concerto dual-chamber device).  Despite his multiple medical problems Alan Orr initially done well. He has mild shortness of breath on exertion but is able to perform usual activities. He has not had any problems with palpitations or defibrillator discharge. She denies any chest pain. Has not had any focal neurological problems. He takes Pradaxa for stroke prevention has not had any bleeding complication. He denies  edema or orthopnea.    No Known Allergies  Current Outpatient Prescriptions  Medication Sig Dispense Refill  . ALPRAZolam (XANAX) 1 MG tablet Take 1 mg by mouth at bedtime as needed for sleep.      Marland Kitchen amiodarone (PACERONE) 200 MG tablet Take 200 mg by mouth daily.      Marland Kitchen aspirin 81 MG tablet Take 81 mg by mouth daily.      Marland Kitchen atorvastatin (LIPITOR) 40 MG tablet Take 40 mg by mouth daily.      . dabigatran (PRADAXA) 75 MG CAPS capsule Take by mouth every 12 (twelve) hours.      . digoxin (LANOXIN) 0.125 MG tablet Take 0.125 mg by mouth daily.      Marland Kitchen ezetimibe (ZETIA) 10 MG tablet Take 10 mg by mouth daily.      Marland Kitchen levothyroxine (SYNTHROID, LEVOTHROID) 112 MCG tablet Take 112 mcg by mouth daily before breakfast.      . magnesium  oxide (MAG-OX) 400 MG tablet Take 400 mg by mouth daily.      . metFORMIN (GLUCOPHAGE) 500 MG tablet Take 500 mg by mouth 2 (two) times daily with a meal.      . metoprolol (LOPRESSOR) 50 MG tablet Take 50 mg by mouth 2 (two) times daily.      . niacin (NIASPAN) 1000 MG CR tablet TAKE (1) TABLET BY MOUTH AT BEDTIME.  30 tablet  5  . nitroGLYCERIN (NITROSTAT) 0.4 MG SL tablet Place 0.4 mg under the tongue every 5 (five) minutes as needed for chest pain.      . pioglitazone (ACTOS) 45 MG tablet Take 45 mg by mouth daily.      . ramipril (ALTACE) 2.5 MG capsule Take 2.5 mg by mouth daily.      Marland Kitchen zolpidem (AMBIEN) 10 MG tablet Take 10 mg by mouth at bedtime as needed for sleep.      . fluocinonide cream (LIDEX) 0.05 % 2 (two) times daily.      Marland Kitchen oxyCODONE (OXY IR/ROXICODONE) 5 MG immediate release tablet as needed.       No current facility-administered medications for this visit.    Past Medical History  Diagnosis Date  . Ischemic cardiomyopathy   . CAD (coronary artery disease)   . S/P CABG x 5   . PAF (paroxysmal atrial fibrillation)   . Hypertension   . Hyperlipidemia   . DM (diabetes mellitus)   . RBBB  Past Surgical History  Procedure Laterality Date  . Coronary artery bypass graft  12/31/1995    LIMA to LAD,SVG to intermediate,SVG to CX,seq. svg to posterior descending and posterolateral RCA  . Cardiac catheterization  05/08/2009    small vessel disease  . Cardiac defibrillator placement  05/09/2009    Medtronic    No family history on file.  History   Social History  . Marital Status: Married    Spouse Name: N/A    Number of Children: N/A  . Years of Education: N/A   Occupational History  . Not on file.   Social History Main Topics  . Smoking status: Never Smoker   . Smokeless tobacco: Not on file  . Alcohol Use: No  . Drug Use: No  . Sexual Activity: Not on file   Other Topics Concern  . Not on file   Social History Narrative  . No narrative on file      Review of systems: The patient specifically denies any chest pain at rest or with exertion, dyspnea at rest or with usual exertion, orthopnea, paroxysmal nocturnal dyspnea, syncope, palpitations, focal neurological deficits, intermittent claudication, lower extremity edema, unexplained weight gain, cough, hemoptysis or wheezing.  The patient also denies abdominal pain, nausea, vomiting, dysphagia, diarrhea, constipation, polyuria, polydipsia, dysuria, hematuria, frequency, urgency, abnormal bleeding or bruising, fever, chills, unexpected weight changes, mood swings, change in skin or hair texture, change in voice quality, auditory or visual problems, allergic reactions or rashes, new musculoskeletal complaints other than usual "aches and pains".   PHYSICAL EXAM BP 134/60  Pulse 69  Ht 6\' 2"  (1.88 m)  Wt 215 lb (97.523 kg)  BMI 27.59 kg/m2  General: Alert, oriented x3, no distress Head: no evidence of trauma, PERRL, EOMI, no exophtalmos or lid lag, no myxedema, no xanthelasma; normal ears, nose and oropharynx Neck: normal jugular venous pulsations and no hepatojugular reflux; brisk carotid pulses without delay and newly detected right carotid bruit Chest: clear to auscultation, no signs of consolidation by percussion or palpation, normal fremitus, symmetrical and full respiratory excursions; healthy defibrillator site Cardiovascular: normal position and quality of the apical impulse, regular rhythm, normal first and second heart sounds, no murmurs, rubs or gallops Abdomen: no tenderness or distention, no masses by palpation, no abnormal pulsatility or arterial bruits, normal bowel sounds, no hepatosplenomegaly Extremities: no clubbing, cyanosis or edema; 2+ radial, ulnar and brachial pulses bilaterally; 2+ right femoral, posterior tibial and dorsalis pedis pulses; 2+ left femoral, posterior tibial and dorsalis pedis pulses; no subclavian or femoral bruits Neurological: grossly  nonfocal   EKG: Atrioventricular sequential paced  Lipid Panel June 2013 total cholesterol 111 triglycerides 52 HDL 46 LDL 55   BMET    Component Value Date/Time   NA 138 11/07/2010 1452   K 4.6 11/07/2010 1452   CL 102 11/07/2010 1452   CO2 31 11/07/2010 1452   GLUCOSE 97 11/07/2010 1452   BUN 10 11/07/2010 1452   CREATININE 1.06 11/07/2010 1452   CALCIUM 9.6 11/07/2010 1452   GFRNONAA >60 11/07/2010 1452   GFRAA >60 11/07/2010 1452     ASSESSMENT AND PLAN AICD (Medtronic Concerto CRT-D, implanted February 2011) Normal recent remote device check 7/13. His optivol thoracic impedance monitor does not suggest hypervolemia. Good biventricular pacing efficiency (virtually 100%.   Chronic systolic heart failure He appears to be clinically euvolemic. He is on appropriate treatment with ACE inhibitors and beta blockers, although the dose of ACE inhibitor in particular is rather low. Previously  had had problems with low blood pressure. Left ventricular ejection fraction estimated at 42% by nuclear scintigraphy and 30-35% by echocardiography most recently performed in 2011 New [place] Heart Association functional class 2-3  Cardiomyopathy, ischemic    CAD s/p CABG  He is free of angina. He has extensive scar tissue and the distribution of the LAD artery and right coronary artery. It has been 17 years since his bypass procedure. No reversible ischemia.  Paroxysmal atrial fibrillation No clinically evident episodes. No atrophic collection detected by his defibrillator since the last check.  Right carotid bruit Have recommended he undergo duplex ultrasound  Orders Placed This Encounter  Procedures  . EKG 12-Lead   Meds ordered this encounter  Medications  . oxyCODONE (OXY IR/ROXICODONE) 5 MG immediate release tablet    Sig: as needed.  . fluocinonide cream (LIDEX) 0.05 %    Sig: 2 (two) times daily.  . nitroGLYCERIN (NITROSTAT) 0.4 MG SL tablet    Sig: Place 0.4 mg under the tongue every  5 (five) minutes as needed for chest pain.    Junious Silk, MD, [facility] [phone] office [phone] pager

## 2012-12-04 DIAGNOSIS — I509 Heart failure, unspecified: Secondary | ICD-10-CM | POA: Diagnosis not present

## 2012-12-04 DIAGNOSIS — I472 Ventricular tachycardia: Secondary | ICD-10-CM

## 2012-12-04 LAB — ICD DEVICE OBSERVATION

## 2012-12-05 ENCOUNTER — Ambulatory Visit: Payer: Medicare Other

## 2012-12-10 ENCOUNTER — Encounter: Payer: Self-pay | Admitting: *Deleted

## 2012-12-10 LAB — REMOTE ICD DEVICE
AL AMPLITUDE: 0.6 mv
AL IMPEDENCE ICD: 532 Ohm
BAMS-0001: 171 {beats}/min
LV LEAD IMPEDENCE ICD: 551 Ohm
MODE SWITCH EPISODES: 0
RV LEAD AMPLITUDE: 12.1 mv
TZAT-0001FASTVT: 2
TZAT-0001SLOWVT: 2
TZAT-0011FASTVT: 10 ms
TZAT-0011FASTVT: 10 ms
TZAT-0011SLOWVT: 10 ms
TZAT-0013FASTVT: 2
TZAT-0013SLOWVT: 4
TZAT-0018FASTVT: NEGATIVE
TZAT-0018SLOWVT: NEGATIVE
TZON-0003AFLUTTER: 350.8 ms
TZON-0003ATACH: 350.8 ms
TZON-0003FASTVT: 250 ms
TZON-0003SLOWVT: 370.3 ms
TZON-0004SLOWVT: 16
TZON-0005SLOWVT: 12
TZST-0001FASTVT: 3
TZST-0001FASTVT: 5
TZST-0001FASTVT: 6
TZST-0001SLOWVT: 3
TZST-0001SLOWVT: 4
TZST-0001SLOWVT: 6
TZST-0003FASTVT: 35 J
TZST-0003SLOWVT: 25 J
TZST-0003SLOWVT: 35 J
VENTRICULAR PACING ICD: 100 pct

## 2012-12-23 ENCOUNTER — Other Ambulatory Visit: Payer: Self-pay | Admitting: *Deleted

## 2012-12-23 MED ORDER — EZETIMIBE 10 MG PO TABS: 10.0000 mg | ORAL_TABLET | Freq: Every day | ORAL | Status: DC

## 2013-01-05 DIAGNOSIS — Z6826 Body mass index (BMI) 26.0-26.9, adult: Secondary | ICD-10-CM | POA: Diagnosis not present

## 2013-01-05 DIAGNOSIS — I10 Essential (primary) hypertension: Secondary | ICD-10-CM | POA: Diagnosis not present

## 2013-01-05 DIAGNOSIS — E1129 Type 2 diabetes mellitus with other diabetic kidney complication: Secondary | ICD-10-CM | POA: Diagnosis not present

## 2013-01-05 DIAGNOSIS — N182 Chronic kidney disease, stage 2 (mild): Secondary | ICD-10-CM | POA: Diagnosis not present

## 2013-01-08 LAB — ICD DEVICE OBSERVATION

## 2013-01-09 ENCOUNTER — Ambulatory Visit (INDEPENDENT_AMBULATORY_CARE_PROVIDER_SITE_OTHER): Payer: Medicare Other

## 2013-01-09 DIAGNOSIS — I472 Ventricular tachycardia: Secondary | ICD-10-CM | POA: Diagnosis not present

## 2013-01-09 DIAGNOSIS — I2589 Other forms of chronic ischemic heart disease: Secondary | ICD-10-CM

## 2013-01-09 DIAGNOSIS — I4891 Unspecified atrial fibrillation: Secondary | ICD-10-CM

## 2013-01-09 DIAGNOSIS — I480 Paroxysmal atrial fibrillation: Secondary | ICD-10-CM

## 2013-01-09 DIAGNOSIS — I5022 Chronic systolic (congestive) heart failure: Secondary | ICD-10-CM | POA: Diagnosis not present

## 2013-01-09 DIAGNOSIS — I255 Ischemic cardiomyopathy: Secondary | ICD-10-CM

## 2013-01-16 ENCOUNTER — Encounter: Payer: Self-pay | Admitting: *Deleted

## 2013-01-16 LAB — REMOTE ICD DEVICE
ATRIAL PACING ICD: 77.56 pct
BATTERY VOLTAGE: 3.023 V
BRDY-0002LV: 50 {beats}/min
BRDY-0003LV: 130 {beats}/min
BRDY-0004LV: 120 {beats}/min
LV LEAD IMPEDENCE ICD: 551 Ohm
LV LEAD THRESHOLD: 0.75 V
PACEART VT: 0
TZAT-0001FASTVT: 1
TZAT-0002ATACH: NEGATIVE
TZAT-0002ATACH: NEGATIVE
TZAT-0004SLOWVT: 8
TZAT-0005SLOWVT: 88 pct
TZAT-0005SLOWVT: 91 pct
TZAT-0011FASTVT: 10 ms
TZAT-0011FASTVT: 10 ms
TZAT-0011SLOWVT: 10 ms
TZAT-0011SLOWVT: 10 ms
TZAT-0012ATACH: 150 ms
TZAT-0018ATACH: NEGATIVE
TZAT-0018FASTVT: NEGATIVE
TZAT-0018FASTVT: NEGATIVE
TZAT-0019ATACH: 6 V
TZAT-0019ATACH: 6 V
TZAT-0019ATACH: 6 V
TZAT-0019FASTVT: 8 V
TZAT-0019FASTVT: 8 V
TZAT-0020ATACH: 1.5 ms
TZAT-0020ATACH: 1.5 ms
TZAT-0020FASTVT: 1.5 ms
TZAT-0020FASTVT: 1.5 ms
TZON-0003FASTVT: 250 ms
TZON-0003SLOWVT: 370 ms
TZST-0001FASTVT: 4
TZST-0001FASTVT: 5
TZST-0001SLOWVT: 4
TZST-0003FASTVT: 35 J
TZST-0003FASTVT: 35 J
TZST-0003SLOWVT: 25 J
TZST-0003SLOWVT: 35 J
VENTRICULAR PACING ICD: 99.96 pct
VF: 0

## 2013-01-24 ENCOUNTER — Telehealth (HOSPITAL_COMMUNITY): Payer: Self-pay | Admitting: *Deleted

## 2013-01-24 ENCOUNTER — Other Ambulatory Visit (HOSPITAL_COMMUNITY): Payer: Self-pay | Admitting: Cardiovascular Disease

## 2013-01-24 DIAGNOSIS — I25810 Atherosclerosis of coronary artery bypass graft(s) without angina pectoris: Secondary | ICD-10-CM

## 2013-01-27 ENCOUNTER — Other Ambulatory Visit: Payer: Self-pay | Admitting: Cardiovascular Disease

## 2013-01-27 NOTE — Telephone Encounter (Signed)
Rx for Metoprolol Tart sent into pharmacy electronically.  Rx for Pradaxa routed to Phillips Hay, PharmD for approval.

## 2013-02-03 ENCOUNTER — Ambulatory Visit (HOSPITAL_COMMUNITY)
Admission: RE | Admit: 2013-02-03 | Discharge: 2013-02-03 | Disposition: A | Discharge status: Home / Self Care | Payer: Medicare Other | Source: Ambulatory Visit | Attending: Cardiovascular Disease | Admitting: Cardiovascular Disease

## 2013-02-03 DIAGNOSIS — I6529 Occlusion and stenosis of unspecified carotid artery: Secondary | ICD-10-CM | POA: Diagnosis not present

## 2013-02-03 DIAGNOSIS — I2510 Atherosclerotic heart disease of native coronary artery without angina pectoris: Secondary | ICD-10-CM | POA: Insufficient documentation

## 2013-02-03 DIAGNOSIS — I25810 Atherosclerosis of coronary artery bypass graft(s) without angina pectoris: Secondary | ICD-10-CM

## 2013-02-03 NOTE — Progress Notes (Signed)
Carotid Duplex Completed. °Brianna L Mazza,RVT °

## 2013-03-03 ENCOUNTER — Ambulatory Visit (INDEPENDENT_AMBULATORY_CARE_PROVIDER_SITE_OTHER): Payer: Medicare Other | Admitting: Cardiovascular Disease

## 2013-03-03 ENCOUNTER — Encounter: Payer: Self-pay | Admitting: Cardiovascular Disease

## 2013-03-03 VITALS — BP 150/84 | HR 64 | Ht 74.0 in | Wt 207.0 lb

## 2013-03-03 DIAGNOSIS — Z8679 Personal history of other diseases of the circulatory system: Secondary | ICD-10-CM | POA: Diagnosis not present

## 2013-03-03 DIAGNOSIS — E785 Hyperlipidemia, unspecified: Secondary | ICD-10-CM | POA: Diagnosis not present

## 2013-03-03 DIAGNOSIS — I2510 Atherosclerotic heart disease of native coronary artery without angina pectoris: Secondary | ICD-10-CM | POA: Diagnosis not present

## 2013-03-03 NOTE — Assessment & Plan Note (Signed)
On statin therapy followed by his PCP 

## 2013-03-03 NOTE — Patient Instructions (Signed)
Your physician wants you to follow-up in: 1 year. You will receive a reminder letter in the mail two months in advance. If you don't receive a letter, please call our office to schedule the follow-up appointment.  

## 2013-03-03 NOTE — Assessment & Plan Note (Signed)
Recent carotid Dopplers showed only minimal bilateral ICA stenosis.

## 2013-03-03 NOTE — Assessment & Plan Note (Signed)
Status post coronary artery bypass grafting in 1997. His last cath was performed by Dr. Clarene Duke in February 2001 revealing patent grafts, moderate LV dysfunction with unchanged anatomy. His last Myoview performed 03/17/12 shows extensive scar in the LAD territory. His EF is in the 35% range. He is otherwise asymptomatic. He had by the ICD placed by Dr. Sharrell Ku followed by Dr. Royann Shivers on an outpatient quarterly basis.

## 2013-03-03 NOTE — Progress Notes (Signed)
03/03/2013 Alan Orr [facility]   1945-06-03  [id number]  Primary Physician Alan Orr., MD Primary Cardiologist: Alan Gess MD Alan Orr   HPI:  The patient is a 67 year old mildly overweight married Caucasian male, father of 2, who I last saw in the office 6 months ago. He has a history of ischemic cardiomyopathy, status post coronary artery bypass grafting in 1997. He was catheterized by Dr. Clarene Orr, May 08, 2009, revealing patent grafts with moderate LV dysfunction, unchanged anatomy. 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 His other problems include hypertension, hyperlipidemia, and type 2 diabetes. He has a BiV ICD, implanted by Dr. Lewayne Orr, which Dr. Royann Orr follows on a quarterly outpatient basis. He had a recent Myoview earlier this month that showed an anteroapical scar with an EF of 42%, unchanged since prior study. He is otherwise asymptomatic    Current Outpatient Prescriptions  Medication Sig Dispense Refill  . ALPRAZolam (XANAX) 1 MG tablet Take 1 mg by mouth at bedtime as needed for sleep.      Marland Kitchen amiodarone (PACERONE) 200 MG tablet Take 200 mg by mouth daily.      Marland Kitchen aspirin 81 MG tablet Take 81 mg by mouth daily.      Marland Kitchen atorvastatin (LIPITOR) 40 MG tablet Take 40 mg by mouth daily.      . digoxin (LANOXIN) 0.125 MG tablet Take 0.125 mg by mouth daily.      Marland Kitchen ezetimibe (ZETIA) 10 MG tablet Take 1 tablet (10 mg total) by mouth daily.  30 tablet  9  . fluocinonide cream (LIDEX) 0.05 % 2 (two) times daily.      Marland Kitchen levothyroxine (SYNTHROID, LEVOTHROID) 112 MCG tablet Take 112 mcg by mouth daily before breakfast.      . magnesium oxide (MAG-OX) 400 MG tablet Take 400 mg by mouth daily.      . metFORMIN (GLUCOPHAGE) 500 MG tablet Take 500 mg by mouth 2 (two) times daily with a meal.      . metoprolol (LOPRESSOR) 50 MG tablet TAKE 1 TABLET BY  MOUTH TWICE DAILY.  180 tablet  2  . niacin (NIASPAN) 1000 MG CR tablet TAKE (1) TABLET BY MOUTH AT BEDTIME.  30 tablet  5  . nitroGLYCERIN (NITROSTAT) 0.4 MG SL tablet Place 0.4 mg under the tongue every 5 (five) minutes as needed for chest pain.      Marland Kitchen oxyCODONE (OXY IR/ROXICODONE) 5 MG immediate release tablet as needed.      . pioglitazone (ACTOS) 45 MG tablet Take 45 mg by mouth daily.      Marland Kitchen PRADAXA 75 MG CAPS capsule TAKE 1 CAPSULE BY MOUTH EVERY 12 HOURS.  60 capsule  5  . ramipril (ALTACE) 2.5 MG capsule Take 2.5 mg by mouth daily.      Marland Kitchen zolpidem (AMBIEN) 10 MG tablet Take 10 mg by mouth at bedtime as needed for sleep.       No current facility-administered medications for this visit.    No Known Allergies  History   Social History  . Marital Status: Married    Spouse Name: N/A    Number of Children: N/A  . Years of Education: N/A   Occupational History  . Not on file.   Social History Main Topics  . Smoking status: Never Smoker   . Smokeless tobacco: Not on file  . Alcohol Use: No  .  Drug Use: No  . Sexual Activity: Not on file   Other Topics Concern  . Not on file   Social History Narrative  . No narrative on file     Review of Systems: General: negative for chills, fever, night sweats or weight changes.  Cardiovascular: negative for chest pain, dyspnea on exertion, edema, orthopnea, palpitations, paroxysmal nocturnal dyspnea or shortness of breath Dermatological: negative for rash Respiratory: negative for cough or wheezing Urologic: negative for hematuria Abdominal: negative for nausea, vomiting, diarrhea, bright red blood per rectum, melena, or hematemesis Neurologic: negative for visual changes, syncope, or dizziness All other systems reviewed and are otherwise negative except as noted above.    Blood pressure 150/84, pulse 64, height 6\' 2"  (1.88 m), weight 207 lb (93.895 kg).  General appearance: alert and no distress Neck: no adenopathy, no  carotid bruit, no JVD, supple, symmetrical, trachea midline and thyroid not enlarged, symmetric, no tenderness/mass/nodules Lungs: clear to auscultation bilaterally Heart: regular rate and rhythm, S1, S2 normal, no murmur, click, rub or gallop Extremities: extremities normal, atraumatic, no cyanosis or edema  EKG not performed today  ASSESSMENT AND PLAN:   CAD s/p CABG  Status post coronary artery bypass grafting in 1997. His last cath was performed by Dr. Clarene Orr in February 2001 revealing patent grafts, moderate LV dysfunction with unchanged anatomy. His last Myoview performed 03/17/12 shows extensive scar in the LAD territory. His EF is in the 35% range. He is otherwise asymptomatic. He had by the ICD placed by Dr. Sharrell Orr followed by Dr. Royann Orr on an outpatient quarterly basis.  History of carotid artery disease Recent carotid Dopplers showed only minimal bilateral ICA stenosis.  Hyperlipidemia On statin therapy followed by his PCP      Alan Gess MD [facility] 03/03/2013 3:59 PM

## 2013-04-19 ENCOUNTER — Telehealth: Payer: Self-pay | Admitting: *Deleted

## 2013-04-19 NOTE — Telephone Encounter (Signed)
PA was done for pradaxa and was approved.  04-17-13-04-17-14

## 2013-04-21 ENCOUNTER — Ambulatory Visit (INDEPENDENT_AMBULATORY_CARE_PROVIDER_SITE_OTHER): Payer: Medicare Other | Admitting: Cardiovascular Disease

## 2013-04-21 ENCOUNTER — Encounter: Payer: Self-pay | Admitting: Cardiovascular Disease

## 2013-04-21 VITALS — BP 100/60 | HR 73 | Ht 74.0 in | Wt 203.0 lb

## 2013-04-21 DIAGNOSIS — R5383 Other fatigue: Secondary | ICD-10-CM | POA: Diagnosis not present

## 2013-04-21 DIAGNOSIS — I2510 Atherosclerotic heart disease of native coronary artery without angina pectoris: Secondary | ICD-10-CM

## 2013-04-21 DIAGNOSIS — I5022 Chronic systolic (congestive) heart failure: Secondary | ICD-10-CM

## 2013-04-21 DIAGNOSIS — I472 Ventricular tachycardia, unspecified: Secondary | ICD-10-CM

## 2013-04-21 DIAGNOSIS — I4891 Unspecified atrial fibrillation: Secondary | ICD-10-CM

## 2013-04-21 DIAGNOSIS — R5381 Other malaise: Secondary | ICD-10-CM

## 2013-04-21 DIAGNOSIS — E782 Mixed hyperlipidemia: Secondary | ICD-10-CM

## 2013-04-21 DIAGNOSIS — I480 Paroxysmal atrial fibrillation: Secondary | ICD-10-CM

## 2013-04-21 DIAGNOSIS — I2589 Other forms of chronic ischemic heart disease: Secondary | ICD-10-CM

## 2013-04-21 DIAGNOSIS — I4729 Other ventricular tachycardia: Secondary | ICD-10-CM

## 2013-04-21 DIAGNOSIS — Z79899 Other long term (current) drug therapy: Secondary | ICD-10-CM

## 2013-04-21 DIAGNOSIS — Z95810 Presence of automatic (implantable) cardiac defibrillator: Secondary | ICD-10-CM

## 2013-04-21 DIAGNOSIS — I255 Ischemic cardiomyopathy: Secondary | ICD-10-CM

## 2013-04-21 LAB — MDC_IDC_ENUM_SESS_TYPE_INCLINIC
Battery Voltage: 3.01 V
Brady Statistic AP VP Percent: 70.7 %
Brady Statistic AP VS Percent: 0.1 % — CL
Brady Statistic AS VS Percent: 0.1 % — CL
HIGH POWER IMPEDANCE MEASURED VALUE: 42 Ohm
HighPow Impedance: 54 Ohm
Lead Channel Pacing Threshold Amplitude: 0.75 V
Lead Channel Pacing Threshold Amplitude: 0.75 V
Lead Channel Pacing Threshold Amplitude: 0.75 V
Lead Channel Pacing Threshold Pulse Width: 0.4 ms
Lead Channel Pacing Threshold Pulse Width: 0.4 ms
Lead Channel Sensing Intrinsic Amplitude: 1.1 mV
Lead Channel Sensing Intrinsic Amplitude: 12.1 mV
Lead Channel Setting Pacing Amplitude: 2.5 V
Lead Channel Setting Sensing Sensitivity: 0.3 mV
MDC IDC MSMT LEADCHNL LV IMPEDANCE VALUE: 494 Ohm
MDC IDC MSMT LEADCHNL RA IMPEDANCE VALUE: 494 Ohm
MDC IDC MSMT LEADCHNL RV IMPEDANCE VALUE: 532 Ohm
MDC IDC MSMT LEADCHNL RV PACING THRESHOLD PULSEWIDTH: 0.4 ms
MDC IDC SET LEADCHNL LV PACING AMPLITUDE: 1.75 V
MDC IDC SET LEADCHNL LV PACING PULSEWIDTH: 0.4 ms
MDC IDC SET LEADCHNL RV PACING AMPLITUDE: 2 V
MDC IDC SET LEADCHNL RV PACING PULSEWIDTH: 0.4 ms
MDC IDC SET ZONE DETECTION INTERVAL: 350 ms
MDC IDC SET ZONE DETECTION INTERVAL: 370 ms
MDC IDC SET ZONE DETECTION INTERVAL: 450 ms
MDC IDC STAT BRADY AS VP PERCENT: 29.2 %
Zone Setting Detection Interval: 250 ms
Zone Setting Detection Interval: 290 ms

## 2013-04-21 LAB — ICD DEVICE OBSERVATION

## 2013-04-21 NOTE — Patient Instructions (Addendum)
Remote monitoring is used to monitor your ICD from home. This monitoring reduces the number of office visits required to check your device to one time per year. It allows [place] to keep an eye on the functioning of your device to ensure it is working properly. You are scheduled for a device check from home on 07-24-2013. You may send your transmission at any time that day. If you have a wireless device, the transmission will be sent automatically. After your physician reviews your transmission, you will receive a postcard with your next transmission date.  Your physician recommends that you return for lab work whenever possible while fasting (nothing to eat and drink after midnight).   Your physician recommends that you schedule a follow-up appointment in: 12 months with Dr.Croitoru

## 2013-04-24 ENCOUNTER — Encounter: Payer: Self-pay | Admitting: Cardiovascular Disease

## 2013-04-24 NOTE — Progress Notes (Signed)
Patient ID: Alan Orr, male   DOB: 06-30-1945, 68 y.o.   MRN: [id number]      Reason for office visit PAF, VT, ICD check  Alan Orr is a 68 year old gentleman with a long-standing history of moderate ischemic cardiomyopathy (ejection fraction around 35%), compensated congestive heart failure (NYHA functional class II), coronary artery disease (status post bypass surgery 1997 with patent LIMA to LAD, SVG to diagonal/ramus, SVG to OM and SVG to PDA by cardiac catheterization 2011 - possible occlusion of the sequential limb to the PLA), history of paroxysmal atrial fibrillation, history of ventricular tachycardia storm, status post defibrillator/biventricular pacemaker (Medtronic Concerto dual-chamber device).  He feels well. NYHA functional class II dyspnea on exertion. No angina, no palpitations, no syncope and no device discharges  His office device check today shows normal CRT-D function. Roughly 71% atrial pacing and 99.9% biventricular pacing. One 10 beat episode of nonsustained ventricular tachycardia at almost 200 beats per minute was recorded last December.  No Known Allergies  Current Outpatient Prescriptions  Medication Sig Dispense Refill  . ALPRAZolam (XANAX) 1 MG tablet Take 1 mg by mouth at bedtime as needed for sleep.      Marland Kitchen amiodarone (PACERONE) 200 MG tablet Take 200 mg by mouth daily.      Marland Kitchen atorvastatin (LIPITOR) 40 MG tablet Take 40 mg by mouth daily.      . digoxin (LANOXIN) 0.125 MG tablet Take 0.125 mg by mouth daily.      Marland Kitchen ezetimibe (ZETIA) 10 MG tablet Take 1 tablet (10 mg total) by mouth daily.  30 tablet  9  . fluocinonide cream (LIDEX) 0.05 % 2 (two) times daily.      Marland Kitchen levothyroxine (SYNTHROID, LEVOTHROID) 112 MCG tablet Take 112 mcg by mouth daily before breakfast.      . magnesium oxide (MAG-OX) 400 MG tablet Take 400 mg by mouth daily.      . metFORMIN (GLUCOPHAGE) 500 MG tablet Take 500 mg by mouth 2 (two) times daily with a meal.      . metoprolol  (LOPRESSOR) 50 MG tablet TAKE 1 TABLET BY MOUTH TWICE DAILY.  180 tablet  2  . niacin (NIASPAN) 1000 MG CR tablet TAKE (1) TABLET BY MOUTH AT BEDTIME.  30 tablet  5  . nitroGLYCERIN (NITROSTAT) 0.4 MG SL tablet Place 0.4 mg under the tongue every 5 (five) minutes as needed for chest pain.      Marland Kitchen oxyCODONE (OXY IR/ROXICODONE) 5 MG immediate release tablet as needed.      . pioglitazone (ACTOS) 45 MG tablet Take 45 mg by mouth daily.      Marland Kitchen PRADAXA 75 MG CAPS capsule TAKE 1 CAPSULE BY MOUTH EVERY 12 HOURS.  60 capsule  5  . ramipril (ALTACE) 2.5 MG capsule Take 2.5 mg by mouth daily.      Marland Kitchen zolpidem (AMBIEN) 10 MG tablet Take 10 mg by mouth at bedtime as needed for sleep.      Marland Kitchen aspirin 81 MG tablet Take 81 mg by mouth daily.       No current facility-administered medications for this visit.    Past Medical History  Diagnosis Date  . Ischemic cardiomyopathy   . CAD (coronary artery disease)   . S/P CABG x 5   . PAF (paroxysmal atrial fibrillation)   . Hypertension   . Hyperlipidemia   . DM (diabetes mellitus)   . RBBB   . ICD (implantable cardioverter-defibrillator), single, in situ  Past Surgical History  Procedure Laterality Date  . Coronary artery bypass graft  12/31/1995    LIMA to LAD,SVG to intermediate,SVG to CX,seq. svg to posterior descending and posterolateral RCA  . Cardiac catheterization  05/08/2009    small vessel disease  . Cardiac defibrillator placement  05/09/2009    Medtronic    No family history on file.  History   Social History  . Marital Status: Married    Spouse Name: N/A    Number of Children: N/A  . Years of Education: N/A   Occupational History  . Not on file.   Social History Main Topics  . Smoking status: Never Smoker   . Smokeless tobacco: Not on file  . Alcohol Use: No  . Drug Use: No  . Sexual Activity: Not on file   Other Topics Concern  . Not on file   Social History Narrative  . No narrative on file    Review of  systems: The patient specifically denies any chest pain at rest, dyspnea at rest or with exertion, orthopnea, paroxysmal nocturnal dyspnea, syncope, palpitations, focal neurological deficits, intermittent claudication, lower extremity edema, unexplained weight gain, cough, hemoptysis or wheezing.  The patient also denies abdominal pain, nausea, vomiting, dysphagia, diarrhea, constipation, polyuria, polydipsia, dysuria, hematuria, frequency, urgency, abnormal bleeding or bruising, fever, chills, unexpected weight changes, mood swings, change in skin or hair texture, change in voice quality, auditory or visual problems, allergic reactions or rashes, new musculoskeletal complaints other than usual "aches and pains".   PHYSICAL EXAM BP 100/60  Pulse 73  Ht 6' 2"  (1.88 m)  Wt 92.08 kg (203 lb)  BMI 26.05 kg/m2 General: Alert, oriented x3, no distress  Head: no evidence of trauma, PERRL, EOMI, no exophtalmos or lid lag, no myxedema, no xanthelasma; normal ears, nose and oropharynx  Neck: normal jugular venous pulsations and no hepatojugular reflux; brisk carotid pulses without delay, right carotid bruit  Chest: clear to auscultation, no signs of consolidation by percussion or palpation, normal fremitus, symmetrical and full respiratory excursions; healthy defibrillator site  Cardiovascular: normal position and quality of the apical impulse, regular rhythm, normal first and second heart sounds, no murmurs, rubs or gallops  Abdomen: no tenderness or distention, no masses by palpation, no abnormal pulsatility or arterial bruits, normal bowel sounds, no hepatosplenomegaly  Extremities: no clubbing, cyanosis or edema; 2+ radial, ulnar and brachial pulses bilaterally; 2+ right femoral, posterior tibial and dorsalis pedis pulses; 2+ left femoral, posterior tibial and dorsalis pedis pulses; no subclavian or femoral bruits  Neurological: grossly nonfocal   EKG: Atrial paced, biventricular paced  Lipid Panel      Component Value Date/Time   CHOL  Value: 128        ATP III CLASSIFICATION:  <200     mg/dL   Desirable  200-239  mg/dL   Borderline High  >=240    mg/dL   High        05/07/2009 0826   TRIG 46 05/07/2009 0826   HDL 43 05/07/2009 0826   CHOLHDL 3.0 05/07/2009 0826   VLDL 9 05/07/2009 0826   LDLCALC  Value: 76        Total Cholesterol/HDL:CHD Risk Coronary Heart Disease Risk Table                     Men   Women  1/2 Average Risk   3.4   3.3  Average Risk       5.0   4.4  2 X Average Risk   9.6   7.1  3 X Average Risk  23.4   11.0        Use the calculated Patient Ratio above and the CHD Risk Table to determine the patient's CHD Risk.        ATP III CLASSIFICATION (LDL):  <100     mg/dL   Optimal  100-129  mg/dL   Near or Above                    Optimal  130-159  mg/dL   Borderline  160-189  mg/dL   High  >190     mg/dL   Very High 05/07/2009 0826    BMET    Component Value Date/Time   NA 138 11/07/2010 1452   K 4.6 11/07/2010 1452   CL 102 11/07/2010 1452   CO2 31 11/07/2010 1452   GLUCOSE 97 11/07/2010 1452   BUN 10 11/07/2010 1452   CREATININE 1.06 11/07/2010 1452   CALCIUM 9.6 11/07/2010 1452   GFRNONAA >60 11/07/2010 1452   GFRAA >60 11/07/2010 1452     ASSESSMENT AND PLAN AICD (Medtronic Concerto CRT-D, implanted February 2011) Normal device function with 100% biventricular pacing. No permanent changes to make the device settings. He is enrolled in remote monitoring.  CAD s/p CABG  Angina free  Chronic systolic heart failure NYHA functional class II. He appears clinically euvolemic. No changes made to his medications to  Paroxysmal atrial fibrillation No episodes of AF since his last device check. He is on appropriate anticoagulation without bleeding complication. Monitor liver function tests, thyroid function tests eye exam and lung function periodically while he takes amiodarone. Monitor renal function carefully and adjust digoxin dose appropriately, keeping in mind interaction with  amiodarone.    Patient Instructions  Remote monitoring is used to monitor your ICD from home. This monitoring reduces the number of office visits required to check your device to one time per year. It allows [place] to keep an eye on the functioning of your device to ensure it is working properly. You are scheduled for a device check from home on 07-24-2013. You may send your transmission at any time that day. If you have a wireless device, the transmission will be sent automatically. After your physician reviews your transmission, you will receive a postcard with your next transmission date.  Your physician recommends that you return for lab work whenever possible while fasting (nothing to eat and drink after midnight).   Your physician recommends that you schedule a follow-up appointment in: 12 months with Dr.Doxie Augenstein     Orders Placed This Encounter  Procedures  . Comp Met (CMET)  . CBC  . TSH  . Lipid Profile  . Implantable device check  . EKG 12-Lead   No orders of the defined types were placed in this encounter.    Holli Humbles, MD, [place] [phone] office [phone] pager

## 2013-04-24 NOTE — Assessment & Plan Note (Signed)
NYHA functional class II. He appears clinically euvolemic. No changes made to his medications to

## 2013-04-24 NOTE — Assessment & Plan Note (Signed)
Normal device function with 100% biventricular pacing. No permanent changes to make the device settings. He is enrolled in remote monitoring.

## 2013-04-24 NOTE — Assessment & Plan Note (Signed)
Angina free 

## 2013-04-24 NOTE — Assessment & Plan Note (Signed)
No episodes of AF since his last device check. He is on appropriate anticoagulation without bleeding complication. Monitor liver function tests, thyroid function tests eye exam and lung function periodically while he takes amiodarone. Monitor renal function carefully and adjust digoxin dose appropriately, keeping in mind interaction with amiodarone.

## 2013-04-28 ENCOUNTER — Other Ambulatory Visit (HOSPITAL_COMMUNITY): Payer: Self-pay | Admitting: Cardiovascular Disease

## 2013-04-28 ENCOUNTER — Ambulatory Visit (HOSPITAL_COMMUNITY)
Admission: RE | Admit: 2013-04-28 | Discharge: 2013-04-28 | Disposition: A | Discharge status: Home / Self Care | Payer: Medicare Other | Source: Ambulatory Visit | Attending: Internal Medicine | Admitting: Internal Medicine

## 2013-04-28 DIAGNOSIS — I708 Atherosclerosis of other arteries: Secondary | ICD-10-CM | POA: Diagnosis not present

## 2013-04-28 DIAGNOSIS — I739 Peripheral vascular disease, unspecified: Secondary | ICD-10-CM | POA: Diagnosis not present

## 2013-04-28 NOTE — Progress Notes (Signed)
Lower Extremity Arterial Duplex Completed. °Brianna L Mazza,RVT °

## 2013-05-01 ENCOUNTER — Other Ambulatory Visit (HOSPITAL_COMMUNITY): Payer: Self-pay | Admitting: Cardiovascular Disease

## 2013-05-14 ENCOUNTER — Telehealth: Payer: Self-pay | Admitting: *Deleted

## 2013-05-14 ENCOUNTER — Encounter: Payer: Self-pay | Admitting: *Deleted

## 2013-05-14 DIAGNOSIS — I739 Peripheral vascular disease, unspecified: Secondary | ICD-10-CM

## 2013-05-14 NOTE — Telephone Encounter (Signed)
Order placed for repeat lower extremity arterial doppler in 1 year  

## 2013-05-14 NOTE — Telephone Encounter (Signed)
Message copied by Chauncy Lean on Sun May 14, 2013 10:08 PM ------      Message from: Lorretta Harp      Created: Sun May 14, 2013 10:17 AM       Mild increase in iliac and SFA velocities bilaterally. Repeat in 12 months ------

## 2013-06-21 ENCOUNTER — Encounter: Payer: Self-pay | Admitting: *Deleted

## 2013-06-21 ENCOUNTER — Telehealth: Payer: Self-pay | Admitting: *Deleted

## 2013-06-21 NOTE — Telephone Encounter (Signed)
Dr Gwenlyn Found reviewed the chart and gave clearance to proceed with teeth extraction and ok to hold Pradaxa prior to the procedure.  Letter drafted and faxed to Dr Hoyt Koch

## 2013-06-22 ENCOUNTER — Telehealth: Payer: Self-pay | Admitting: *Deleted

## 2013-06-22 NOTE — Telephone Encounter (Signed)
Wife calling to make sure Dr. Gwenlyn Found received clearance request from Dr. Hoyt Koch.  Received and Dr. Gwenlyn Found cleared with an OK to HOLD Pradaxa 2 days prior to the procedure.  Letter sent to Dr. Hoyt Koch last PM.  Wife voiced understanding and will call to schedule the procedure.

## 2013-06-22 NOTE — Telephone Encounter (Signed)
Pt's wife was calling in regards to her husbands tooth extraction. She stated that Dr. Gwenlyn Found should have received a letter from Dr. Diona Browner his dentist about this surgery. She would like a call back today so that she can explain the situation.   JB

## 2013-07-13 DIAGNOSIS — E119 Type 2 diabetes mellitus without complications: Secondary | ICD-10-CM | POA: Diagnosis not present

## 2013-07-13 DIAGNOSIS — E1129 Type 2 diabetes mellitus with other diabetic kidney complication: Secondary | ICD-10-CM | POA: Diagnosis not present

## 2013-07-13 DIAGNOSIS — I2510 Atherosclerotic heart disease of native coronary artery without angina pectoris: Secondary | ICD-10-CM | POA: Diagnosis not present

## 2013-07-13 DIAGNOSIS — Z125 Encounter for screening for malignant neoplasm of prostate: Secondary | ICD-10-CM | POA: Diagnosis not present

## 2013-07-13 DIAGNOSIS — IMO0002 Reserved for concepts with insufficient information to code with codable children: Secondary | ICD-10-CM | POA: Diagnosis not present

## 2013-07-13 DIAGNOSIS — G8929 Other chronic pain: Secondary | ICD-10-CM | POA: Diagnosis not present

## 2013-07-24 ENCOUNTER — Encounter: Payer: Self-pay | Admitting: Cardiovascular Disease

## 2013-07-24 ENCOUNTER — Telehealth: Payer: Self-pay | Admitting: Cardiology

## 2013-07-24 ENCOUNTER — Ambulatory Visit (INDEPENDENT_AMBULATORY_CARE_PROVIDER_SITE_OTHER): Payer: Medicare Other | Admitting: *Deleted

## 2013-07-24 DIAGNOSIS — I4729 Other ventricular tachycardia: Secondary | ICD-10-CM | POA: Diagnosis not present

## 2013-07-24 DIAGNOSIS — I472 Ventricular tachycardia, unspecified: Secondary | ICD-10-CM

## 2013-07-24 LAB — MDC_IDC_ENUM_SESS_TYPE_REMOTE
Brady Statistic AP VS Percent: 0.02 %
Brady Statistic AS VS Percent: 0.01 %
HighPow Impedance: 38 Ohm
HighPow Impedance: 46 Ohm
Lead Channel Impedance Value: 437 Ohm
Lead Channel Impedance Value: 494 Ohm
Lead Channel Sensing Intrinsic Amplitude: 12.5 mV
Lead Channel Setting Pacing Amplitude: 2 V
Lead Channel Setting Pacing Amplitude: 2.5 V
Lead Channel Setting Pacing Amplitude: 2.5 V
Lead Channel Setting Pacing Pulse Width: 0.4 ms
Lead Channel Setting Pacing Pulse Width: 0.4 ms
MDC IDC MSMT BATTERY VOLTAGE: 2.99 V
MDC IDC MSMT LEADCHNL LV IMPEDANCE VALUE: 836 Ohm
MDC IDC MSMT LEADCHNL LV PACING THRESHOLD AMPLITUDE: 0.875 V
MDC IDC MSMT LEADCHNL LV PACING THRESHOLD PULSEWIDTH: 0.4 ms
MDC IDC MSMT LEADCHNL RA IMPEDANCE VALUE: 437 Ohm
MDC IDC MSMT LEADCHNL RA SENSING INTR AMPL: 0.5 mV
MDC IDC MSMT LEADCHNL RA SENSING INTR AMPL: 0.5 mV
MDC IDC MSMT LEADCHNL RV IMPEDANCE VALUE: 532 Ohm
MDC IDC MSMT LEADCHNL RV SENSING INTR AMPL: 12.5 mV
MDC IDC SESS DTM: 20150511154749
MDC IDC SET LEADCHNL RV SENSING SENSITIVITY: 0.3 mV
MDC IDC SET ZONE DETECTION INTERVAL: 290 ms
MDC IDC STAT BRADY AP VP PERCENT: 82.77 %
MDC IDC STAT BRADY AS VP PERCENT: 17.2 %
MDC IDC STAT BRADY RA PERCENT PACED: 82.79 %
MDC IDC STAT BRADY RV PERCENT PACED: 99.97 %
Zone Setting Detection Interval: 250 ms
Zone Setting Detection Interval: 350 ms
Zone Setting Detection Interval: 370 ms
Zone Setting Detection Interval: 450 ms

## 2013-07-24 NOTE — Progress Notes (Signed)
Remote ICD transmission.   

## 2013-07-24 NOTE — Telephone Encounter (Signed)
Spoke with pt and reminding pt to send remote transmission in. He verbalized understanding.

## 2013-08-03 ENCOUNTER — Encounter: Payer: Self-pay | Admitting: Cardiology

## 2013-10-02 ENCOUNTER — Encounter (HOSPITAL_COMMUNITY): Payer: Self-pay | Admitting: Emergency Medicine

## 2013-10-02 ENCOUNTER — Emergency Department (HOSPITAL_COMMUNITY)
Admission: EM | Admit: 2013-10-02 | Discharge: 2013-10-02 | Disposition: A | Discharge status: Home / Self Care | Payer: Medicare Other | Attending: Emergency Medicine | Admitting: Emergency Medicine

## 2013-10-02 ENCOUNTER — Emergency Department (HOSPITAL_COMMUNITY): Payer: Medicare Other

## 2013-10-02 DIAGNOSIS — S8010XA Contusion of unspecified lower leg, initial encounter: Secondary | ICD-10-CM | POA: Insufficient documentation

## 2013-10-02 DIAGNOSIS — I4891 Unspecified atrial fibrillation: Secondary | ICD-10-CM | POA: Insufficient documentation

## 2013-10-02 DIAGNOSIS — I10 Essential (primary) hypertension: Secondary | ICD-10-CM | POA: Diagnosis not present

## 2013-10-02 DIAGNOSIS — S81809A Unspecified open wound, unspecified lower leg, initial encounter: Secondary | ICD-10-CM | POA: Diagnosis not present

## 2013-10-02 DIAGNOSIS — S81009A Unspecified open wound, unspecified knee, initial encounter: Secondary | ICD-10-CM | POA: Diagnosis not present

## 2013-10-02 DIAGNOSIS — Z7982 Long term (current) use of aspirin: Secondary | ICD-10-CM | POA: Diagnosis not present

## 2013-10-02 DIAGNOSIS — Y92009 Unspecified place in unspecified non-institutional (private) residence as the place of occurrence of the external cause: Secondary | ICD-10-CM | POA: Insufficient documentation

## 2013-10-02 DIAGNOSIS — S70359A Superficial foreign body, unspecified thigh, initial encounter: Principal | ICD-10-CM

## 2013-10-02 DIAGNOSIS — W28XXXA Contact with powered lawn mower, initial encounter: Secondary | ICD-10-CM | POA: Diagnosis not present

## 2013-10-02 DIAGNOSIS — Y9389 Activity, other specified: Secondary | ICD-10-CM | POA: Diagnosis not present

## 2013-10-02 DIAGNOSIS — S80859A Superficial foreign body, unspecified lower leg, initial encounter: Principal | ICD-10-CM | POA: Insufficient documentation

## 2013-10-02 DIAGNOSIS — E119 Type 2 diabetes mellitus without complications: Secondary | ICD-10-CM | POA: Diagnosis not present

## 2013-10-02 DIAGNOSIS — S90559A Superficial foreign body, unspecified ankle, initial encounter: Principal | ICD-10-CM

## 2013-10-02 DIAGNOSIS — Z951 Presence of aortocoronary bypass graft: Secondary | ICD-10-CM | POA: Insufficient documentation

## 2013-10-02 DIAGNOSIS — S70259A Superficial foreign body, unspecified hip, initial encounter: Secondary | ICD-10-CM | POA: Insufficient documentation

## 2013-10-02 DIAGNOSIS — I2510 Atherosclerotic heart disease of native coronary artery without angina pectoris: Secondary | ICD-10-CM | POA: Insufficient documentation

## 2013-10-02 DIAGNOSIS — Z95810 Presence of automatic (implantable) cardiac defibrillator: Secondary | ICD-10-CM | POA: Insufficient documentation

## 2013-10-02 DIAGNOSIS — Z79899 Other long term (current) drug therapy: Secondary | ICD-10-CM | POA: Diagnosis not present

## 2013-10-02 DIAGNOSIS — T148XXA Other injury of unspecified body region, initial encounter: Secondary | ICD-10-CM

## 2013-10-02 DIAGNOSIS — M795 Residual foreign body in soft tissue: Secondary | ICD-10-CM

## 2013-10-02 MED ORDER — CEPHALEXIN 500 MG PO CAPS
500.0000 mg | ORAL_CAPSULE | Freq: Once | ORAL | Status: AC
  Administered 2013-10-02: 500 mg via ORAL
  Filled 2013-10-02: qty 1

## 2013-10-02 MED ORDER — CEPHALEXIN 500 MG PO CAPS: 500.0000 mg | ORAL_CAPSULE | Freq: Four times a day (QID) | ORAL | Status: DC

## 2013-10-02 NOTE — ED Notes (Signed)
On 09/22/13 Pt flipped his lawn mover onto his left lower leg. Pt presents to ED with brusing/redness/swelling to left lower leg. Pt has hx of blood clot to left leg.

## 2013-10-02 NOTE — ED Provider Notes (Signed)
CSN: [id number]     Arrival date & time 10/02/13  1607 History   First MD Initiated Contact with Patient 10/02/13 0710     Chief Complaint  Patient presents with  . Leg Pain      HPI  Presents with pain and swelling in his left lower leg. He states about 10 days ago he was riding his lawnmower on an incline. It is a riding lawnmower. It rolled down the hill. The edge of the mower and pinning his left leg to the ground. Passerby's help get the mower from on top of him. He is able to stand and walk. He was able to drive lawnmower home. He was mowing a short distance from his house. They became swollen. He states it hurt minimally and he continued to work and walk. 2 days ago he bumped it again gently getting out of his truck on the edge of the doorjamb. He has become more swollen. Although, it is really not painful. He is concerned because "I had a blood clot in there". In 2010 he developed acutely ischemic leg. He had an acute embolectomy and patch angioplasty of his popliteal artery. He has done well, and continued on Pradaxa since that time.  Past Medical History  Diagnosis Date  . Ischemic cardiomyopathy   . CAD (coronary artery disease)   . S/P CABG x 5   . PAF (paroxysmal atrial fibrillation)   . Hypertension   . Hyperlipidemia   . DM (diabetes mellitus)   . RBBB   . ICD (implantable cardioverter-defibrillator), single, in situ    Past Surgical History  Procedure Laterality Date  . Coronary artery bypass graft  12/31/1995    LIMA to LAD,SVG to intermediate,SVG to CX,seq. svg to posterior descending and posterolateral RCA  . Cardiac catheterization  05/08/2009    small vessel disease  . Cardiac defibrillator placement  05/09/2009    Medtronic   No family history on file. History  Substance Use Topics  . Smoking status: Never Smoker   . Smokeless tobacco: Not on file  . Alcohol Use: No    Review of Systems  Constitutional: Negative for fever, chills, diaphoresis, appetite  change and fatigue.  HENT: Negative for mouth sores, sore throat and trouble swallowing.   Eyes: Negative for visual disturbance.  Respiratory: Negative for cough, chest tightness, shortness of breath and wheezing.   Cardiovascular: Negative for chest pain.  Gastrointestinal: Negative for nausea, vomiting, abdominal pain, diarrhea and abdominal distention.  Endocrine: Negative for polydipsia, polyphagia and polyuria.  Genitourinary: Negative for dysuria, frequency and hematuria.  Musculoskeletal: Negative for gait problem.       Left lower extremity pain and swelling  Skin: Negative for color change, pallor and rash.  Neurological: Negative for dizziness, syncope, light-headedness and headaches.  Hematological: Bruises/bleeds easily.  Psychiatric/Behavioral: Negative for behavioral problems and confusion.      Allergies  Review of patient's allergies indicates no known allergies.  Home Medications   Prior to Admission medications   Medication Sig Start Date End Date Taking? Authorizing Provider  ALPRAZolam Duanne Moron) 1 MG tablet Take 1 mg by mouth at bedtime as needed for sleep.    Historical Provider, MD  amiodarone (PACERONE) 200 MG tablet Take 200 mg by mouth daily.    Historical Provider, MD  aspirin 81 MG tablet Take 81 mg by mouth daily.    Historical Provider, MD  atorvastatin (LIPITOR) 40 MG tablet Take 40 mg by mouth daily.    Historical Provider,  MD  digoxin (LANOXIN) 0.125 MG tablet Take 0.125 mg by mouth daily.    Historical Provider, MD  ezetimibe (ZETIA) 10 MG tablet Take 1 tablet (10 mg total) by mouth daily. 12/23/12   Lorretta Harp, MD  fluocinonide cream (LIDEX) 0.05 % 2 (two) times daily. 10/21/12   Historical Provider, MD  levothyroxine (SYNTHROID, LEVOTHROID) 112 MCG tablet Take 112 mcg by mouth daily before breakfast.    Historical Provider, MD  magnesium oxide (MAG-OX) 400 MG tablet Take 400 mg by mouth daily.    Historical Provider, MD  metFORMIN (GLUCOPHAGE)  500 MG tablet Take 500 mg by mouth 2 (two) times daily with a meal.    Historical Provider, MD  metoprolol (LOPRESSOR) 50 MG tablet TAKE 1 TABLET BY MOUTH TWICE DAILY. 01/27/13   Mihai Croitoru, MD  niacin (NIASPAN) 1000 MG CR tablet TAKE (1) TABLET BY MOUTH AT BEDTIME. 05/01/13   Lorretta Harp, MD  nitroGLYCERIN (NITROSTAT) 0.4 MG SL tablet Place 0.4 mg under the tongue every 5 (five) minutes as needed for chest pain.    Historical Provider, MD  oxyCODONE (OXY IR/ROXICODONE) 5 MG immediate release tablet as needed. 08/31/12   Historical Provider, MD  pioglitazone (ACTOS) 45 MG tablet Take 45 mg by mouth daily.    Historical Provider, MD  PRADAXA 75 MG CAPS capsule TAKE 1 CAPSULE BY MOUTH EVERY 12 HOURS. 01/27/13   Mihai Croitoru, MD  ramipril (ALTACE) 2.5 MG capsule Take 2.5 mg by mouth daily.    Historical Provider, MD  zolpidem (AMBIEN) 10 MG tablet Take 10 mg by mouth at bedtime as needed for sleep.    Historical Provider, MD   BP 131/71  Pulse 64  Temp(Src) 97.9 F (36.6 C) (Oral)  Resp 18  Ht 6\' 2"  (1.88 m)  Wt 180 lb (81.647 kg)  BMI 23.10 kg/m2  SpO2 100% Physical Exam  Constitutional: He is oriented to person, place, and time. He appears well-developed and well-nourished. No distress.  HENT:  Head: Normocephalic.  Eyes: Conjunctivae are normal. Pupils are equal, round, and reactive to light. No scleral icterus.  Neck: Normal range of motion. Neck supple. No thyromegaly present.  Cardiovascular: Normal rate and regular rhythm.  Exam reveals no gallop and no friction rub.   No murmur heard. Pulmonary/Chest: Effort normal and breath sounds normal. No respiratory distress. He has no wheezes. He has no rales.  Abdominal: Soft. Bowel sounds are normal. He exhibits no distension. There is no tenderness. There is no rebound.  Musculoskeletal: Normal range of motion.       Legs: Neurological: He is alert and oriented to person, place, and time.  Skin: Skin is warm and dry. No rash  noted.  Psychiatric: He has a normal mood and affect. His behavior is normal.    ED Course  Procedures (including critical care time) Labs Review Labs Reviewed - No data to display  Imaging Review Dg Tibia/fibula Left  10/02/2013   CLINICAL DATA:  Left lower leg injury 1 week ago.  Continued pain.  EXAM: LEFT TIBIA AND FIBULA - 2 VIEW  COMPARISON:  None.  FINDINGS: No fracture or other focal bony abnormality is identified. There is a 0.3 cm in diameter radiopaque foreign body in the soft tissues of the medial aspect of the left lower leg along the midshaft of the tibia. Chronicity of this abnormality is indeterminate. Atherosclerotic calcifications are noted.  IMPRESSION: Negative for fracture or other acute bony abnormality.  Small radiopaque foreign body  in the medial soft tissues of the left lower leg is age indeterminate.   Electronically Signed   By: Inge Rise M.D.   On: 10/02/2013 08:03     EKG Interpretation None      MDM   Final diagnoses:  Hematoma  Foreign body (FB) in soft tissue    X-ray show foreign body under the skin. I discussed this with the patient and his wife. I have shown them his x-rays. He states it was the initial injury it was "scrape" but there was no full thickness puncture or laceration. I doubt that this metallic foreign bodies from his current injury. However, we discussed this as a possibility. I am hesitant to explore his hematoma in the area of the previous embolectomy. His actual surgical site is tender 15 cm proximal. Plan will be Keflex. Compressive dressing was placed. If he gets red streaking, pain, fevers, pus from the area, or any other change in symptoms, then recheck here. Routine recheck with his physician Dr. Gerarda Fraction in 7 days.    Tanna Furry, MD 10/02/13 [phone]

## 2013-10-02 NOTE — ED Notes (Signed)
Instructions on wound care and wound dressing given. Several telfa dressings provided to patient prior to discharge to last until he can get some from pharmacy.

## 2013-10-02 NOTE — ED Notes (Signed)
Patient with no complaints at this time. Respirations even and unlabored. Skin warm/dry. Discharge instructions reviewed with patient at this time. Patient given opportunity to voice concerns/ask questions. Patient discharged at this time and left Emergency Department with steady gait.   

## 2013-10-02 NOTE — Discharge Instructions (Signed)
There is a piece of metal under the skin in your leg. It may do more harm than good to try to remove it. Re check here if your start to get red streaks up the leg, pus from the leg, fever, or pain in the area. Re check with Dr. Gerarda Fraction for re-evaluation in 7 days.  Hematoma A hematoma is a collection of blood. The collection of blood can turn into a hard, painful lump under the skin. Your skin may turn blue or yellow if the hematoma is close to the surface of the skin. Most hematomas get better in a few days to weeks. Some hematomas are serious and need medical care. Hematomas can be very small or very big. HOME CARE  Apply ice to the injured area:  Put ice in a plastic bag.  Place a towel between your skin and the bag.  Leave the ice on for 20 minutes, 2-3 times a day for the first 1 to 2 days.  After the first 2 days, switch to using warm packs on the injured area.  Raise (elevate) the injured area to lessen pain and puffiness (swelling). You may also wrap the area with an elastic bandage. Make sure the bandage is not wrapped too tight.  If you have a painful hematoma on your leg or foot, you may use crutches for a couple days.  Only take medicines as told by your doctor. GET HELP RIGHT AWAY IF:   Your pain gets worse.  Your pain is not controlled with medicine.  You have a fever.  Your puffiness gets worse.  Your skin turns more blue or yellow.  Your skin over the hematoma breaks or starts bleeding.  Your hematoma is in your chest or belly (abdomen) and you are short of breath, feel weak, or have a change in consciousness.  Your hematoma is on your scalp and you have a headache that gets worse or a change in alertness or consciousness. MAKE SURE YOU:   Understand these instructions.  Will watch your condition.  Will get help right away if you are not doing well or get worse. Document Released: 04/09/2004 Document Revised: 11/02/2012 Document Reviewed:  08/10/2012 [facility] Patient Information 2015 [place], [place]. This information is not intended to replace advice given to you by your health care provider. Make sure you discuss any questions you have with your health care provider.

## 2013-10-10 DIAGNOSIS — T148XXA Other injury of unspecified body region, initial encounter: Secondary | ICD-10-CM | POA: Diagnosis not present

## 2013-10-10 DIAGNOSIS — IMO0002 Reserved for concepts with insufficient information to code with codable children: Secondary | ICD-10-CM | POA: Diagnosis not present

## 2013-10-10 DIAGNOSIS — G8929 Other chronic pain: Secondary | ICD-10-CM | POA: Diagnosis not present

## 2013-10-20 DIAGNOSIS — E119 Type 2 diabetes mellitus without complications: Secondary | ICD-10-CM | POA: Diagnosis not present

## 2013-10-26 ENCOUNTER — Ambulatory Visit (INDEPENDENT_AMBULATORY_CARE_PROVIDER_SITE_OTHER): Payer: Medicare Other | Admitting: *Deleted

## 2013-10-26 DIAGNOSIS — I2589 Other forms of chronic ischemic heart disease: Secondary | ICD-10-CM

## 2013-10-26 DIAGNOSIS — I472 Ventricular tachycardia, unspecified: Secondary | ICD-10-CM

## 2013-10-26 DIAGNOSIS — I4729 Other ventricular tachycardia: Secondary | ICD-10-CM

## 2013-10-26 DIAGNOSIS — I255 Ischemic cardiomyopathy: Secondary | ICD-10-CM

## 2013-10-26 NOTE — Progress Notes (Signed)
Remote ICD transmission.   

## 2013-10-27 ENCOUNTER — Telehealth: Payer: Self-pay | Admitting: *Deleted

## 2013-10-27 DIAGNOSIS — M1710 Unilateral primary osteoarthritis, unspecified knee: Secondary | ICD-10-CM | POA: Diagnosis not present

## 2013-10-27 LAB — MDC_IDC_ENUM_SESS_TYPE_REMOTE
Battery Voltage: 2.96 V
Brady Statistic AP VP Percent: 72.84 %
Brady Statistic AP VS Percent: 0.03 %
Brady Statistic AS VS Percent: 0.03 %
Brady Statistic RA Percent Paced: 72.87 %
Brady Statistic RV Percent Paced: 99.94 %
HIGH POWER IMPEDANCE MEASURED VALUE: 52 Ohm
HighPow Impedance: 40 Ohm
Lead Channel Impedance Value: 532 Ohm
Lead Channel Pacing Threshold Pulse Width: 0.4 ms
Lead Channel Sensing Intrinsic Amplitude: 0.375 mV
Lead Channel Sensing Intrinsic Amplitude: 0.375 mV
Lead Channel Sensing Intrinsic Amplitude: 11.375 mV
Lead Channel Sensing Intrinsic Amplitude: 11.375 mV
Lead Channel Setting Pacing Amplitude: 2 V
Lead Channel Setting Pacing Amplitude: 2.5 V
Lead Channel Setting Pacing Amplitude: 2.5 V
Lead Channel Setting Pacing Pulse Width: 0.4 ms
Lead Channel Setting Pacing Pulse Width: 0.4 ms
Lead Channel Setting Sensing Sensitivity: 0.3 mV
MDC IDC MSMT LEADCHNL LV IMPEDANCE VALUE: 494 Ohm
MDC IDC MSMT LEADCHNL LV IMPEDANCE VALUE: 893 Ohm
MDC IDC MSMT LEADCHNL LV PACING THRESHOLD AMPLITUDE: 0.875 V
MDC IDC MSMT LEADCHNL RA IMPEDANCE VALUE: 475 Ohm
MDC IDC MSMT LEADCHNL RV IMPEDANCE VALUE: 532 Ohm
MDC IDC SESS DTM: 20150812130129
MDC IDC SET ZONE DETECTION INTERVAL: 250 ms
MDC IDC SET ZONE DETECTION INTERVAL: 350 ms
MDC IDC STAT BRADY AS VP PERCENT: 27.1 %
Zone Setting Detection Interval: 290 ms
Zone Setting Detection Interval: 370 ms
Zone Setting Detection Interval: 450 ms

## 2013-10-27 NOTE — Telephone Encounter (Signed)
Message copied by Tressa Busman on Fri Oct 27, 2013 12:10 PM ------      Message from: Sanda Klein      Created: Fri Oct 27, 2013 10:59 AM       His ICD downloads suggest that he may be building up fluid. Please call him and ask if he has any swelling, shortness of breath, weight gain and make sure he is avoiding salt in his diet. Thanks      MCr ------

## 2013-10-27 NOTE — Telephone Encounter (Signed)
Spoke with wife.  Alan Orr had a riding mower accident 09/22/13 - still has a piece of metal in his left lower leg that has not healed yet. Seen in ER they did not want to take out the metal.  He has been in the bed a lot since the accident, due to intermittent bleeding.  He is hard of hearing so she is talking for him during this phone conversation:  He has not gained weight, he has lost weight, no swelling or unusual SOB.  She will make sure he avoids high sodium foods.  He is seeing Dr. French Ana today in [place] for his leg injury.  Info sent to Dr. Loletha Grayer for his review.

## 2013-10-27 NOTE — Telephone Encounter (Signed)
Thanks

## 2013-10-30 DIAGNOSIS — L97209 Non-pressure chronic ulcer of unspecified calf with unspecified severity: Secondary | ICD-10-CM | POA: Diagnosis not present

## 2013-10-30 DIAGNOSIS — E1149 Type 2 diabetes mellitus with other diabetic neurological complication: Secondary | ICD-10-CM | POA: Diagnosis not present

## 2013-10-31 ENCOUNTER — Encounter (HOSPITAL_COMMUNITY): Payer: Self-pay | Admitting: Pharmacy Technician

## 2013-10-31 ENCOUNTER — Other Ambulatory Visit (HOSPITAL_COMMUNITY): Payer: Self-pay | Admitting: Orthopedic Surgery

## 2013-11-02 ENCOUNTER — Encounter (HOSPITAL_COMMUNITY): Payer: Self-pay | Admitting: *Deleted

## 2013-11-02 MED ORDER — CEFAZOLIN SODIUM-DEXTROSE 2-3 GM-% IV SOLR
2.0000 g | INTRAVENOUS | Status: AC
  Administered 2013-11-03: 2 g via INTRAVENOUS
  Filled 2013-11-02: qty 50

## 2013-11-02 NOTE — Progress Notes (Signed)
Called Dr. Jess Barters office and spoke with Baird Lyons. Asked if they had gotten a cardiac clearance on this patient and if Dr. Sharol Given was aware of pt being on Pradaxa. Malachy Mood stated that Dr. Sharol Given is aware of the Pradaxa and who the pt's cardiologist but will need to look into this further. Awaiting a return call from her.

## 2013-11-02 NOTE — Progress Notes (Signed)
Alan Orr returned phone call. States she has talked with Dr. Sharol Given and he is aware of pt's cardiac history, but pt's problem is a "life or limb" type situation and surgery cannot be delayed.

## 2013-11-02 NOTE — Progress Notes (Signed)
Spoke with pt's wife for pre-op phone call. She states he was napping and is hard of hearing. She verified medical/surgica history, meds and allergies. Gave her pre-op instructions.

## 2013-11-02 NOTE — Progress Notes (Signed)
Anesthesia chart review: Patient is a 69 year old male scheduled for left leg debridement of ulcer, apply wound VAC and Theraskin on 11/03/13 by Dr. Sharol Orr.  He is scheduled as a same day work-up. He had a riding mower accident on 09/22/13 and sustained a LLE that has not healed.    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  PCP is listed as Dr. Redmond Orr. Primary cardiologist is Dr. Quay Orr, last visit 03/03/13. EP cardiologist is Dr. Sallyanne Orr, last visit 04/24/13. His last communication with [name] was on 10/27/13 because ICD download suggested that he may be building up fluid; however, patient was asymptomatic but reported his accident and limited mobility due to leg injury with plans to see orthopedics due to non-healing.  He was advised to watch his sodium intake.    Meds includes amiodarone, ASA, Pradaxa, Lipitor, levothyroxine, Mag-oxide, metformin, metoprolol, Actos, Altace. Confirmed with Alan Orr at Dr. Jess Orr office that he is aware that patient is on Pradaxa and to please let patient known if he needs to hold it prior to surgery.  EKG on 04/21/13 showed: AV dual paced rhythm with prolonged AV conduction, biventricular pacemaker detected.  Nuclear stress test on 03/17/12 showed: Overall Impression: Intermediate stress nuclear study demonstrating a large region of scar (extent 34%) in the LAD and RCA territory without associated ischemia. LV Wall Motion: Moderate LV dysfunction EF42% with akinesis of apex and hypocontractility of mid-distal anterolateral and inferior walls. According to Dr. Kennon Orr 03/03/13 note, this Myoveiw was felt unchanged since his prior study.  Medical therapy  was continued.  Echo on 07/15/09 showed: - Left ventricle: The cavity size was normal. Wall thickness was increased in a pattern of moderate LVH. Systolic function was moderately to severely reduced. The estimated ejection fraction was in the range of 30% to 35%. Akinesis of the distal anteroseptal and apical myocardium. Doppler parameters are consistent with abnormal left ventricular relaxation (grade 1 diastolic dysfunction). Cannot exclude apical thrombus. - Mitral valve: Mildly calcified annulus. Mild regurgitation. - Left atrium: The atrium was mildly dilated.  Cardiac cath on 05/08/09 showed: LVEF 30%. 1+ mitral regurgitation. End-diastolic pressure was 22 in the LV cavity size appeared slightly dilated. Left main was normal. Circumflex was small and free of disease. No OMs were visualized coming out the circumflex vessel, but there were grafted. LAD was 100% occluded after the first septal perforator. It was grafted. RCA was 100% occluded in the midportion. This vessel was grafted. Small vessels, diabetic type disease in all of his vessels. GRAFTS: SVG to PDA, SVG to OM, SVG to DIAG, LIMA to LAD were widely patent.    Carotid duplex on 02/03/13 showed: Bilateral subclavian arteries demonstrated elevated velocities consistent with 50-69% diameter reduction. Bilateral bulb/proximal ICAs demonstrated a mild amount of fibrous plaque but no evidence of significant diameter reduction, tortuosity or any other vascular abnormalities. 92-month followup recommended.   No CXR or labs in Epic with the past year.  Patient with a significant cardiac history and as outlined above and known diabetes.  He has an non-healing leg injury with need for I&D and wound VAC in hopes to improve chances for wound healing and prevent spread of injection.  He  is a same day work-up, so CXR and labs will have to be reviewed on the day of surgery.  Patient will also be evaluated by his assigned anesthesiologist.  Unless worrisome  findings on CXR or labs or with acute CHF/CV symptoms then I would anticipate that he could proceed as planned with this procedure.  Anesthesiologist Dr. Ola Orr agrees with this plan.   Alan Orr [facility] Short Stay Center/Anesthesiology Phone [phone] 11/02/2013 3:27 PM

## 2013-11-03 ENCOUNTER — Encounter (HOSPITAL_COMMUNITY): Payer: Self-pay | Admitting: *Deleted

## 2013-11-03 ENCOUNTER — Inpatient Hospital Stay (HOSPITAL_COMMUNITY): Payer: Medicare Other | Admitting: Vascular Surgery

## 2013-11-03 ENCOUNTER — Inpatient Hospital Stay (HOSPITAL_COMMUNITY): Payer: Medicare Other

## 2013-11-03 ENCOUNTER — Encounter (HOSPITAL_COMMUNITY): Payer: Medicare Other | Admitting: Vascular Surgery

## 2013-11-03 ENCOUNTER — Ambulatory Visit (HOSPITAL_COMMUNITY)
Admission: RE | Admit: 2013-11-03 | Discharge: 2013-11-04 | Disposition: A | Discharge status: Home / Self Care | Payer: Medicare Other | Source: Ambulatory Visit | Attending: Orthopedic Surgery | Admitting: Orthopedic Surgery

## 2013-11-03 ENCOUNTER — Encounter (HOSPITAL_COMMUNITY)
Admission: RE | Disposition: A | Discharge status: Home / Self Care | Payer: Self-pay | Source: Ambulatory Visit | Attending: Orthopedic Surgery

## 2013-11-03 DIAGNOSIS — I2510 Atherosclerotic heart disease of native coronary artery without angina pectoris: Secondary | ICD-10-CM | POA: Insufficient documentation

## 2013-11-03 DIAGNOSIS — I10 Essential (primary) hypertension: Secondary | ICD-10-CM | POA: Diagnosis not present

## 2013-11-03 DIAGNOSIS — I4510 Unspecified right bundle-branch block: Secondary | ICD-10-CM | POA: Diagnosis not present

## 2013-11-03 DIAGNOSIS — E785 Hyperlipidemia, unspecified: Secondary | ICD-10-CM | POA: Diagnosis not present

## 2013-11-03 DIAGNOSIS — Z951 Presence of aortocoronary bypass graft: Secondary | ICD-10-CM | POA: Diagnosis not present

## 2013-11-03 DIAGNOSIS — Z95810 Presence of automatic (implantable) cardiac defibrillator: Secondary | ICD-10-CM | POA: Insufficient documentation

## 2013-11-03 DIAGNOSIS — L97209 Non-pressure chronic ulcer of unspecified calf with unspecified severity: Secondary | ICD-10-CM | POA: Diagnosis not present

## 2013-11-03 DIAGNOSIS — L97809 Non-pressure chronic ulcer of other part of unspecified lower leg with unspecified severity: Secondary | ICD-10-CM | POA: Diagnosis not present

## 2013-11-03 DIAGNOSIS — Z87891 Personal history of nicotine dependence: Secondary | ICD-10-CM | POA: Diagnosis not present

## 2013-11-03 DIAGNOSIS — I2589 Other forms of chronic ischemic heart disease: Secondary | ICD-10-CM | POA: Insufficient documentation

## 2013-11-03 DIAGNOSIS — L02419 Cutaneous abscess of limb, unspecified: Secondary | ICD-10-CM | POA: Diagnosis not present

## 2013-11-03 DIAGNOSIS — L988 Other specified disorders of the skin and subcutaneous tissue: Secondary | ICD-10-CM | POA: Insufficient documentation

## 2013-11-03 DIAGNOSIS — E119 Type 2 diabetes mellitus without complications: Secondary | ICD-10-CM | POA: Insufficient documentation

## 2013-11-03 DIAGNOSIS — E1149 Type 2 diabetes mellitus with other diabetic neurological complication: Secondary | ICD-10-CM | POA: Diagnosis not present

## 2013-11-03 DIAGNOSIS — L03119 Cellulitis of unspecified part of limb: Principal | ICD-10-CM

## 2013-11-03 DIAGNOSIS — S81802A Unspecified open wound, left lower leg, initial encounter: Secondary | ICD-10-CM | POA: Diagnosis present

## 2013-11-03 DIAGNOSIS — R0989 Other specified symptoms and signs involving the circulatory and respiratory systems: Secondary | ICD-10-CM | POA: Diagnosis not present

## 2013-11-03 HISTORY — PX: I&D EXTREMITY: SHX5045

## 2013-11-03 LAB — SURGICAL PCR SCREEN
MRSA, PCR: NEGATIVE
Staphylococcus aureus: POSITIVE — AB

## 2013-11-03 LAB — COMPREHENSIVE METABOLIC PANEL
ALBUMIN: 2.5 g/dL — AB (ref 3.5–5.2)
ALT: 9 U/L (ref 0–53)
ANION GAP: 8 (ref 5–15)
AST: 18 U/L (ref 0–37)
Alkaline Phosphatase: 66 U/L (ref 39–117)
BUN: 16 mg/dL (ref 6–23)
CHLORIDE: 104 meq/L (ref 96–112)
CO2: 27 mEq/L (ref 19–32)
CREATININE: 1.42 mg/dL — AB (ref 0.50–1.35)
Calcium: 8.8 mg/dL (ref 8.4–10.5)
GFR calc Af Amer: 57 mL/min — ABNORMAL LOW (ref >90)
GFR calc non Af Amer: 49 mL/min — ABNORMAL LOW (ref >90)
GLUCOSE: 88 mg/dL (ref 70–99)
POTASSIUM: 4.2 meq/L (ref 3.7–5.3)
Sodium: 139 mEq/L (ref 137–147)
Total Bilirubin: 0.2 mg/dL — ABNORMAL LOW (ref 0.3–1.2)
Total Protein: 5.3 g/dL — ABNORMAL LOW (ref 6.0–8.3)

## 2013-11-03 LAB — CBC
HEMATOCRIT: 32.8 % — AB (ref 39.0–52.0)
HEMOGLOBIN: 10.8 g/dL — AB (ref 13.0–17.0)
MCH: 33.6 pg (ref 26.0–34.0)
MCHC: 32.9 g/dL (ref 30.0–36.0)
MCV: 102.2 fL — AB (ref 78.0–100.0)
Platelets: 216 10*3/uL (ref 150–400)
RBC: 3.21 MIL/uL — ABNORMAL LOW (ref 4.22–5.81)
RDW: 14.4 % (ref 11.5–15.5)
WBC: 5.5 10*3/uL (ref 4.0–10.5)

## 2013-11-03 LAB — GLUCOSE, CAPILLARY
GLUCOSE-CAPILLARY: 73 mg/dL (ref 70–99)
GLUCOSE-CAPILLARY: 95 mg/dL (ref 70–99)
Glucose-Capillary: 102 mg/dL — ABNORMAL HIGH (ref 70–99)
Glucose-Capillary: 76 mg/dL (ref 70–99)

## 2013-11-03 LAB — PROTIME-INR
INR: 1.36 (ref 0.00–1.49)
PROTHROMBIN TIME: 16.8 s — AB (ref 11.6–15.2)

## 2013-11-03 LAB — APTT: aPTT: 50 seconds — ABNORMAL HIGH (ref 24–37)

## 2013-11-03 SURGERY — IRRIGATION AND DEBRIDEMENT EXTREMITY
Anesthesia: General | Site: Leg Lower | Laterality: Left

## 2013-11-03 MED ORDER — ONDANSETRON HCL 4 MG/2ML IJ SOLN
INTRAMUSCULAR | Status: DC | PRN
  Administered 2013-11-03: 4 mg via INTRAVENOUS

## 2013-11-03 MED ORDER — LIDOCAINE HCL (CARDIAC) 20 MG/ML IV SOLN
INTRAVENOUS | Status: AC
  Filled 2013-11-03: qty 5

## 2013-11-03 MED ORDER — MUPIROCIN 2 % EX OINT
1.0000 "application " | TOPICAL_OINTMENT | Freq: Once | CUTANEOUS | Status: AC
  Administered 2013-11-03: 1 via TOPICAL

## 2013-11-03 MED ORDER — ATORVASTATIN CALCIUM 40 MG PO TABS
40.0000 mg | ORAL_TABLET | Freq: Every day | ORAL | Status: DC
  Administered 2013-11-03 – 2013-11-04 (×2): 40 mg via ORAL
  Filled 2013-11-03 (×4): qty 1

## 2013-11-03 MED ORDER — LIDOCAINE HCL (CARDIAC) 20 MG/ML IV SOLN
INTRAVENOUS | Status: DC | PRN
  Administered 2013-11-03: 60 mg via INTRAVENOUS

## 2013-11-03 MED ORDER — ALPRAZOLAM 1 MG PO TABS: 1.0000 mg | ORAL_TABLET | Freq: Every evening | ORAL | Status: DC | PRN

## 2013-11-03 MED ORDER — NIACIN ER 500 MG PO CPCR
1000.0000 mg | ORAL_CAPSULE | Freq: Every day | ORAL | Status: DC
  Administered 2013-11-03: 1000 mg via ORAL
  Filled 2013-11-03 (×2): qty 2

## 2013-11-03 MED ORDER — FENTANYL CITRATE 0.05 MG/ML IJ SOLN
INTRAMUSCULAR | Status: DC | PRN
  Administered 2013-11-03 (×2): 50 ug via INTRAVENOUS

## 2013-11-03 MED ORDER — RAMIPRIL 2.5 MG PO CAPS
2.5000 mg | ORAL_CAPSULE | Freq: Every day | ORAL | Status: DC
  Filled 2013-11-03 (×2): qty 1

## 2013-11-03 MED ORDER — SODIUM CHLORIDE 0.9 % IR SOLN
Status: DC | PRN
  Administered 2013-11-03: 1000 mL

## 2013-11-03 MED ORDER — CEFAZOLIN SODIUM 1-5 GM-% IV SOLN
1.0000 g | Freq: Four times a day (QID) | INTRAVENOUS | Status: AC
  Administered 2013-11-03 – 2013-11-04 (×3): 1 g via INTRAVENOUS
  Filled 2013-11-03 (×3): qty 50

## 2013-11-03 MED ORDER — METHOCARBAMOL 1000 MG/10ML IJ SOLN
500.0000 mg | Freq: Four times a day (QID) | INTRAVENOUS | Status: DC | PRN
  Filled 2013-11-03: qty 5

## 2013-11-03 MED ORDER — ALPRAZOLAM 0.5 MG PO TABS
1.0000 mg | ORAL_TABLET | Freq: Every evening | ORAL | Status: DC | PRN
  Administered 2013-11-04: 1 mg via ORAL
  Filled 2013-11-03: qty 2

## 2013-11-03 MED ORDER — PROPOFOL 10 MG/ML IV BOLUS
INTRAVENOUS | Status: AC
  Filled 2013-11-03: qty 20

## 2013-11-03 MED ORDER — LACTATED RINGERS IV SOLN
INTRAVENOUS | Status: DC | PRN
  Administered 2013-11-03: 13:00:00 via INTRAVENOUS

## 2013-11-03 MED ORDER — MAGNESIUM OXIDE 400 MG PO TABS
400.0000 mg | ORAL_TABLET | Freq: Every day | ORAL | Status: DC
  Administered 2013-11-03 – 2013-11-04 (×2): 400 mg via ORAL
  Filled 2013-11-03 (×2): qty 1

## 2013-11-03 MED ORDER — HYDROMORPHONE HCL PF 1 MG/ML IJ SOLN: 0.5000 mg | INTRAMUSCULAR | Status: DC | PRN

## 2013-11-03 MED ORDER — NIACIN ER (ANTIHYPERLIPIDEMIC) 1000 MG PO TBCR: 1000.0000 mg | EXTENDED_RELEASE_TABLET | Freq: Every day | ORAL | Status: DC

## 2013-11-03 MED ORDER — DABIGATRAN ETEXILATE MESYLATE 75 MG PO CAPS
75.0000 mg | ORAL_CAPSULE | Freq: Two times a day (BID) | ORAL | Status: DC
  Administered 2013-11-03 – 2013-11-04 (×2): 75 mg via ORAL
  Filled 2013-11-03 (×4): qty 1

## 2013-11-03 MED ORDER — HYDROMORPHONE HCL PF 1 MG/ML IJ SOLN: 0.2500 mg | INTRAMUSCULAR | Status: DC | PRN

## 2013-11-03 MED ORDER — ONDANSETRON HCL 4 MG/2ML IJ SOLN
INTRAMUSCULAR | Status: AC
  Filled 2013-11-03: qty 2

## 2013-11-03 MED ORDER — MIDAZOLAM HCL 2 MG/2ML IJ SOLN
INTRAMUSCULAR | Status: AC
  Filled 2013-11-03: qty 2

## 2013-11-03 MED ORDER — METOCLOPRAMIDE HCL 5 MG/ML IJ SOLN: 5.0000 mg | Freq: Three times a day (TID) | INTRAMUSCULAR | Status: DC | PRN

## 2013-11-03 MED ORDER — OXYCODONE-ACETAMINOPHEN 5-325 MG PO TABS
1.0000 | ORAL_TABLET | ORAL | Status: DC | PRN
  Administered 2013-11-03 – 2013-11-04 (×4): 2 via ORAL
  Filled 2013-11-03 (×4): qty 2

## 2013-11-03 MED ORDER — DOCUSATE SODIUM 100 MG PO CAPS
100.0000 mg | ORAL_CAPSULE | Freq: Two times a day (BID) | ORAL | Status: DC
  Administered 2013-11-03 – 2013-11-04 (×2): 100 mg via ORAL
  Filled 2013-11-03 (×2): qty 1

## 2013-11-03 MED ORDER — METOCLOPRAMIDE HCL 5 MG PO TABS
5.0000 mg | ORAL_TABLET | Freq: Three times a day (TID) | ORAL | Status: DC | PRN
  Filled 2013-11-03: qty 2

## 2013-11-03 MED ORDER — PROPOFOL 10 MG/ML IV BOLUS
INTRAVENOUS | Status: DC | PRN
  Administered 2013-11-03: 30 mg via INTRAVENOUS
  Administered 2013-11-03: 100 mg via INTRAVENOUS

## 2013-11-03 MED ORDER — EZETIMIBE 10 MG PO TABS
10.0000 mg | ORAL_TABLET | Freq: Every day | ORAL | Status: DC
  Administered 2013-11-03: 10 mg via ORAL
  Filled 2013-11-03 (×2): qty 1

## 2013-11-03 MED ORDER — DIGOXIN 125 MCG PO TABS
0.1250 mg | ORAL_TABLET | Freq: Every day | ORAL | Status: DC
  Administered 2013-11-03 – 2013-11-04 (×2): 0.125 mg via ORAL
  Filled 2013-11-03 (×3): qty 1

## 2013-11-03 MED ORDER — FENTANYL CITRATE 0.05 MG/ML IJ SOLN
INTRAMUSCULAR | Status: AC
  Filled 2013-11-03: qty 5

## 2013-11-03 MED ORDER — SODIUM CHLORIDE 0.9 % IV SOLN
INTRAVENOUS | Status: DC
  Administered 2013-11-03: 22:00:00 via INTRAVENOUS

## 2013-11-03 MED ORDER — ROCURONIUM BROMIDE 50 MG/5ML IV SOLN
INTRAVENOUS | Status: AC
  Filled 2013-11-03: qty 1

## 2013-11-03 MED ORDER — AMIODARONE HCL 200 MG PO TABS
200.0000 mg | ORAL_TABLET | Freq: Every day | ORAL | Status: DC
  Administered 2013-11-04: 200 mg via ORAL
  Filled 2013-11-03 (×4): qty 1

## 2013-11-03 MED ORDER — METOPROLOL TARTRATE 50 MG PO TABS
50.0000 mg | ORAL_TABLET | Freq: Two times a day (BID) | ORAL | Status: DC
  Administered 2013-11-04: 50 mg via ORAL
  Filled 2013-11-03 (×3): qty 1

## 2013-11-03 MED ORDER — ASPIRIN EC 81 MG PO TBEC
81.0000 mg | DELAYED_RELEASE_TABLET | Freq: Every day | ORAL | Status: DC
  Administered 2013-11-03: 81 mg via ORAL
  Filled 2013-11-03 (×2): qty 1

## 2013-11-03 MED ORDER — ONDANSETRON HCL 4 MG PO TABS: 4.0000 mg | ORAL_TABLET | Freq: Four times a day (QID) | ORAL | Status: DC | PRN

## 2013-11-03 MED ORDER — METHOCARBAMOL 500 MG PO TABS
500.0000 mg | ORAL_TABLET | Freq: Four times a day (QID) | ORAL | Status: DC | PRN
  Administered 2013-11-03: 500 mg via ORAL
  Filled 2013-11-03: qty 1

## 2013-11-03 MED ORDER — NEOSTIGMINE METHYLSULFATE 10 MG/10ML IV SOLN
INTRAVENOUS | Status: AC
  Filled 2013-11-03: qty 1

## 2013-11-03 MED ORDER — LACTATED RINGERS IV SOLN
INTRAVENOUS | Status: DC
  Administered 2013-11-03: 12:00:00 via INTRAVENOUS

## 2013-11-03 MED ORDER — 0.9 % SODIUM CHLORIDE (POUR BTL) OPTIME
TOPICAL | Status: DC | PRN
  Administered 2013-11-03: 1000 mL

## 2013-11-03 MED ORDER — MUPIROCIN 2 % EX OINT
TOPICAL_OINTMENT | CUTANEOUS | Status: AC
  Administered 2013-11-03: 1 via TOPICAL
  Filled 2013-11-03: qty 22

## 2013-11-03 MED ORDER — INSULIN ASPART 100 UNIT/ML ~~LOC~~ SOLN
0.0000 [IU] | Freq: Three times a day (TID) | SUBCUTANEOUS | Status: DC
  Administered 2013-11-04 (×2): 1 [IU] via SUBCUTANEOUS

## 2013-11-03 MED ORDER — METFORMIN HCL 500 MG PO TABS
500.0000 mg | ORAL_TABLET | Freq: Two times a day (BID) | ORAL | Status: DC
  Administered 2013-11-03 – 2013-11-04 (×2): 500 mg via ORAL
  Filled 2013-11-03 (×4): qty 1

## 2013-11-03 MED ORDER — ONDANSETRON HCL 4 MG/2ML IJ SOLN: 4.0000 mg | Freq: Four times a day (QID) | INTRAMUSCULAR | Status: DC | PRN

## 2013-11-03 MED ORDER — LEVOTHYROXINE SODIUM 112 MCG PO TABS
112.0000 ug | ORAL_TABLET | Freq: Every day | ORAL | Status: DC
  Administered 2013-11-04: 112 ug via ORAL
  Filled 2013-11-03 (×2): qty 1

## 2013-11-03 MED ORDER — ZOLPIDEM TARTRATE 5 MG PO TABS: 5.0000 mg | ORAL_TABLET | Freq: Every evening | ORAL | Status: DC | PRN

## 2013-11-03 MED ORDER — GLYCOPYRROLATE 0.2 MG/ML IJ SOLN
INTRAMUSCULAR | Status: AC
  Filled 2013-11-03: qty 3

## 2013-11-03 MED ORDER — PIOGLITAZONE HCL 45 MG PO TABS
45.0000 mg | ORAL_TABLET | Freq: Every day | ORAL | Status: DC
  Administered 2013-11-03 – 2013-11-04 (×2): 45 mg via ORAL
  Filled 2013-11-03 (×2): qty 1

## 2013-11-03 MED ORDER — MIDAZOLAM HCL 5 MG/5ML IJ SOLN
INTRAMUSCULAR | Status: DC | PRN
  Administered 2013-11-03: 1 mg via INTRAVENOUS

## 2013-11-03 MED ORDER — ZOLPIDEM TARTRATE 10 MG PO TABS: 10.0000 mg | ORAL_TABLET | Freq: Every evening | ORAL | Status: DC | PRN

## 2013-11-03 SURGICAL SUPPLY — 47 items
BLADE SURG 10 STRL SS (BLADE) IMPLANT
BNDG COHESIVE 4X5 TAN STRL (GAUZE/BANDAGES/DRESSINGS) IMPLANT
BNDG COHESIVE 6X5 TAN STRL LF (GAUZE/BANDAGES/DRESSINGS) IMPLANT
CANISTER WOUND CARE 500ML ATS (WOUND CARE) ×3 IMPLANT
COVER SURGICAL LIGHT HANDLE (MISCELLANEOUS) ×3 IMPLANT
CUFF TOURNIQUET SINGLE 18IN (TOURNIQUET CUFF) IMPLANT
CUFF TOURNIQUET SINGLE 24IN (TOURNIQUET CUFF) IMPLANT
CUFF TOURNIQUET SINGLE 34IN LL (TOURNIQUET CUFF) IMPLANT
CUFF TOURNIQUET SINGLE 44IN (TOURNIQUET CUFF) IMPLANT
DRAPE INCISE IOBAN 66X45 STRL (DRAPES) ×3 IMPLANT
DRAPE U-SHAPE 47X51 STRL (DRAPES) ×3 IMPLANT
DRSG ADAPTIC 3X8 NADH LF (GAUZE/BANDAGES/DRESSINGS) ×3 IMPLANT
DRSG VAC ATS SM SENSATRAC (GAUZE/BANDAGES/DRESSINGS) ×3 IMPLANT
DURAPREP 26ML APPLICATOR (WOUND CARE) ×3 IMPLANT
ELECT CAUTERY BLADE 6.4 (BLADE) IMPLANT
ELECT REM PT RETURN 9FT ADLT (ELECTROSURGICAL)
ELECTRODE REM PT RTRN 9FT ADLT (ELECTROSURGICAL) IMPLANT
GAUZE SPONGE 4X4 12PLY STRL (GAUZE/BANDAGES/DRESSINGS) ×3 IMPLANT
GLOVE BIOGEL PI IND STRL 9 (GLOVE) ×1 IMPLANT
GLOVE BIOGEL PI INDICATOR 9 (GLOVE) ×2
GLOVE SURG ORTHO 9.0 STRL STRW (GLOVE) ×3 IMPLANT
GLOVE SURG SS PI 7.5 STRL IVOR (GLOVE) ×3 IMPLANT
GOWN STRL REUS W/ TWL XL LVL3 (GOWN DISPOSABLE) ×2 IMPLANT
GOWN STRL REUS W/TWL XL LVL3 (GOWN DISPOSABLE) ×4
HANDPIECE INTERPULSE COAX TIP (DISPOSABLE) ×2
KIT BASIN OR (CUSTOM PROCEDURE TRAY) ×3 IMPLANT
KIT ROOM TURNOVER OR (KITS) ×3 IMPLANT
MANIFOLD NEPTUNE II (INSTRUMENTS) ×3 IMPLANT
NS IRRIG 1000ML POUR BTL (IV SOLUTION) ×3 IMPLANT
PACK ORTHO EXTREMITY (CUSTOM PROCEDURE TRAY) ×3 IMPLANT
PAD ARMBOARD 7.5X6 YLW CONV (MISCELLANEOUS) ×6 IMPLANT
PADDING CAST COTTON 6X4 STRL (CAST SUPPLIES) ×3 IMPLANT
SET HNDPC FAN SPRY TIP SCT (DISPOSABLE) ×1 IMPLANT
SPONGE LAP 18X18 X RAY DECT (DISPOSABLE) ×3 IMPLANT
STAPLER VISISTAT 35W (STAPLE) ×3 IMPLANT
STOCKINETTE IMPERVIOUS 9X36 MD (GAUZE/BANDAGES/DRESSINGS) IMPLANT
STOCKINETTE IMPERVIOUS LG (DRAPES) ×3 IMPLANT
SUT ETHILON 2 0 PSLX (SUTURE) ×6 IMPLANT
TISSUE THERASKIN 2X3 (Tissue) ×3 IMPLANT
TOWEL OR 17X24 6PK STRL BLUE (TOWEL DISPOSABLE) ×3 IMPLANT
TOWEL OR 17X26 10 PK STRL BLUE (TOWEL DISPOSABLE) ×3 IMPLANT
TUBE ANAEROBIC SPECIMEN COL (MISCELLANEOUS) IMPLANT
TUBE CONNECTING 12'X1/4 (SUCTIONS) ×1
TUBE CONNECTING 12X1/4 (SUCTIONS) ×2 IMPLANT
UNDERPAD 30X30 INCONTINENT (UNDERPADS AND DIAPERS) ×3 IMPLANT
WATER STERILE IRR 1000ML POUR (IV SOLUTION) ×3 IMPLANT
YANKAUER SUCT BULB TIP NO VENT (SUCTIONS) ×3 IMPLANT

## 2013-11-03 NOTE — H&P (Signed)
Alan Orr is an 68 y.o. male.   Chief Complaint: Traumatic venous stasis ulcer left leg HPI: Patient is a 68 year old gentleman who is status post a traumatic wound to his left leg. Patient has had progressive enlargement of the hematoma with progressive necrotic wound to the left leg which has failed conservative wound care.  Past Medical History  Diagnosis Date  . Ischemic cardiomyopathy   . CAD (coronary artery disease)   . S/P CABG x 5   . PAF (paroxysmal atrial fibrillation)   . Hypertension   . Hyperlipidemia   . RBBB   . ICD (implantable cardioverter-defibrillator), single, in situ   . DM (diabetes mellitus)     type 2     Past Surgical History  Procedure Laterality Date  . Coronary artery bypass graft  12/31/1995    LIMA to LAD,SVG to intermediate,SVG to CX,seq. svg to posterior descending and posterolateral RCA  . Cardiac catheterization  05/08/2009    small vessel disease  . Cardiac defibrillator placement  05/09/2009    Medtronic  . Back surgery      History reviewed. No pertinent family history. Social History:  reports that he quit smoking about 18 years ago. His smoking use included Cigarettes. He smoked 0.00 packs per day. He has never used smokeless tobacco. He reports that he does not drink alcohol or use illicit drugs.  Allergies: No Known Allergies  No prescriptions prior to admission    No results found for this or any previous visit (from the past 48 hour(s)). No results found.  Review of Systems  All other systems reviewed and are negative.   There were no vitals taken for this visit. Physical Exam  On examination patient has a draining necrotic wound mid aspect left leg with hematoma Assessment/Plan Assessment: Traumatic venous stasis wound left leg.  Plan: We will plan for debridement of necrotic skin soft tissue and muscle plan for application of allograft skin graft. Risks and benefits were discussed including risk of wound not healing.  Patient states he understands and wishes to proceed at this time.  Alan Orr V 11/03/2013, 6:22 AM

## 2013-11-03 NOTE — Anesthesia Postprocedure Evaluation (Signed)
  Anesthesia Post-op Note  Patient: Alan Orr  Procedure(s) Performed: Procedure(s): Left Leg Debride Ulcer, Apply Wound VAC and Theraskin (Left)  Patient Location: PACU  Anesthesia Type:General  Level of Consciousness: awake and alert   Airway and Oxygen Therapy: Patient Spontanous Breathing  Post-op Pain: none  Post-op Assessment: Post-op Vital signs reviewed, Patient's Cardiovascular Status Stable and Respiratory Function Stable  Post-op Vital Signs: Reviewed  Filed Vitals:   11/03/13 1435  BP: 113/43  Pulse: 53  Temp:   Resp: 17    Complications: No apparent anesthesia complications

## 2013-11-03 NOTE — Progress Notes (Signed)
Dr. Linna Caprice called and informed of abnormal labs. Dr. Linna Caprice to speak with Dr. Sharol Given but does not feel labs will interfere with surgery.

## 2013-11-03 NOTE — Progress Notes (Signed)
Plan for discharge Saturday morning. Remove wound VAC applied dry dressing plus Ace wrap. Patient has prescriptions for pain medication at home. Weightbearing as tolerated. Followup in one week.

## 2013-11-03 NOTE — Discharge Instructions (Signed)
Keep leg elevated above the heart. Weightbearing as tolerated. Change dressing as needed.

## 2013-11-03 NOTE — Anesthesia Preprocedure Evaluation (Signed)
Anesthesia Evaluation  Patient identified by MRN, date of birth, ID band Patient awake    Reviewed: Allergy & Precautions, H&P , NPO status , Patient's Chart, lab work & pertinent test results, reviewed documented beta blocker date and time   Airway Mallampati: I TM Distance: >3 FB Neck ROM: Full    Dental no notable dental hx. (+) Edentulous Upper, Dental Advisory Given, Partial Lower   Pulmonary neg pulmonary ROS, former smoker,  breath sounds clear to auscultation  Pulmonary exam normal       Cardiovascular hypertension, On Medications and On Home Beta Blockers + CAD + dysrhythmias Atrial Fibrillation Rhythm:Regular Rate:Normal     Neuro/Psych negative neurological ROS  negative psych ROS   GI/Hepatic negative GI ROS, Neg liver ROS,   Endo/Other  diabetes, Type 2, Oral Hypoglycemic Agents  Renal/GU negative Renal ROS  negative genitourinary   Musculoskeletal   Abdominal   Peds  Hematology negative hematology ROS (+)   Anesthesia Other Findings   Reproductive/Obstetrics negative OB ROS                           Anesthesia Physical Anesthesia Plan  ASA: III  Anesthesia Plan: General   Post-op Pain Management:    Induction: Intravenous  Airway Management Planned: LMA  Additional Equipment:   Intra-op Plan:   Post-operative Plan: Extubation in OR  Informed Consent: I have reviewed the patients History and Physical, chart, labs and discussed the procedure including the risks, benefits and alternatives for the proposed anesthesia with the patient or authorized representative who has indicated his/her understanding and acceptance.   Dental advisory given  Plan Discussed with: CRNA  Anesthesia Plan Comments:         Anesthesia Quick Evaluation

## 2013-11-03 NOTE — Transfer of Care (Signed)
Immediate Anesthesia Transfer of Care Note  Patient: Alan Orr  Procedure(s) Performed: Procedure(s): Left Leg Debride Ulcer, Apply Wound VAC and Theraskin (Left)  Patient Location: PACU  Anesthesia Type:General  Level of Consciousness: awake and alert   Airway & Oxygen Therapy: Patient Spontanous Breathing and Patient connected to nasal cannula oxygen  Post-op Assessment: Report given to PACU RN and Post -op Vital signs reviewed and stable  Post vital signs: Reviewed and stable  Complications: No apparent anesthesia complications

## 2013-11-03 NOTE — Anesthesia Procedure Notes (Signed)
Procedure Name: LMA Insertion Date/Time: 11/03/2013 1:56 PM Performed by: Octavio Graves Pre-anesthesia Checklist: Patient identified, Timeout performed, Emergency Drugs available, Suction available and Patient being monitored Patient Re-evaluated:Patient Re-evaluated prior to inductionOxygen Delivery Method: Circle system utilized Preoxygenation: Pre-oxygenation with 100% oxygen Intubation Type: IV induction LMA: LMA inserted LMA Size: 4.0 Tube type: Oral (20  cc air) Number of attempts: 1 Placement Confirmation: positive ETCO2 and breath sounds checked- equal and bilateral Tube secured with: Tape Comments: IV induction Fitzgerald - LMA Fitzgerald- atraumatic teeth and mouth as preop- bilat BS Ola Spurr

## 2013-11-03 NOTE — Op Note (Signed)
11/03/2013  5:19 PM  PATIENT:  Alan Orr    PRE-OPERATIVE DIAGNOSIS:  Abscess, Ulcer Left Leg  POST-OPERATIVE DIAGNOSIS:  Same  PROCEDURE: Excision of deep  hematoma, skin, soft tissue, fascia, and muscle right leg,  Apply Wound VAC and Theraskin Local tissue rearrangement for wound closure 10 cm x 5 cm  SURGEON:  Newt Minion, MD  PHYSICIAN ASSISTANT:None ANESTHESIA:   General  PREOPERATIVE INDICATIONS:  Alan Orr is a  68 y.o. male with a diagnosis of Abscess, Ulcer Left Leg who failed conservative measures and elected for surgical management.    The risks benefits and alternatives were discussed with the patient preoperatively including but not limited to the risks of infection, bleeding, nerve injury, cardiopulmonary complications, the need for revision surgery, among others, and the patient was willing to proceed.  OPERATIVE IMPLANTS: TheraSkin 2 x 3"  OPERATIVE FINDINGS: Large hematoma and necrotic muscle and fascia.  OPERATIVE PROCEDURE: Patient is a 68 year old gentleman who sustained traumatic wound to the right lower extremity he has had progressive drainage and necrosis from the wound and presents at this time for surgical intervention. Risks and benefits were discussed including infection neurovascular injury pain nonhealing of the wound need for additional surgery. Patient states he understands and wished to proceed at this time. Description of procedure patient was brought to the operating room and underwent a general anesthetic. After adequate levels of anesthesia were obtained patient's left lower extremity was prepped using DuraPrep draped in the sterile field. A timeout was called. An elliptical incision was made around the ulcer which left a wound which is 10 cm long and 5 cm in width. Blunt dissection was used to protect superficial nerves. Hematoma fascia and muscle was sharply debrided with a knife scissor or Ronjair.. Pulsatile lavage was used. The  TheraSkin was then applied to the wound and the wound was closed over the TheraSkin using 2-0 nylon suture. The wound edges approximated nicely. A wound VAC was applied. Patient was extubated taken to the PACU in stable condition.

## 2013-11-04 DIAGNOSIS — L988 Other specified disorders of the skin and subcutaneous tissue: Secondary | ICD-10-CM | POA: Diagnosis not present

## 2013-11-04 DIAGNOSIS — I2589 Other forms of chronic ischemic heart disease: Secondary | ICD-10-CM | POA: Diagnosis not present

## 2013-11-04 DIAGNOSIS — E119 Type 2 diabetes mellitus without complications: Secondary | ICD-10-CM | POA: Diagnosis not present

## 2013-11-04 DIAGNOSIS — I2510 Atherosclerotic heart disease of native coronary artery without angina pectoris: Secondary | ICD-10-CM | POA: Diagnosis not present

## 2013-11-04 DIAGNOSIS — L02419 Cutaneous abscess of limb, unspecified: Secondary | ICD-10-CM | POA: Diagnosis not present

## 2013-11-04 DIAGNOSIS — L97809 Non-pressure chronic ulcer of other part of unspecified lower leg with unspecified severity: Secondary | ICD-10-CM | POA: Diagnosis not present

## 2013-11-04 LAB — GLUCOSE, CAPILLARY
GLUCOSE-CAPILLARY: 133 mg/dL — AB (ref 70–99)
Glucose-Capillary: 143 mg/dL — ABNORMAL HIGH (ref 70–99)

## 2013-11-04 NOTE — Progress Notes (Signed)
Physical Therapy Treatment Patient Details Name: ESTELLE SKIBICKI MRN: [id number] DOB: 1945/11/15 Today's Date: 11/04/2013    History of Present Illness 68 y.o. male s/p Left Leg Debride Ulcer, Apply Wound VAC and Theraskin. Hx paroxysmal atrial fibrillation, CAD, CABG x5,  DM, and s/p ICD placement.    PT Comments    Safely completed stair training similar to home environment. Progressed from rolling walker to quad cane use, and patient demonstrates safe use of this device. States he already has a quad cane at home. All education has been reviewed and patient reports he feels confident in his ability to mobilize at home. He will have 24 hour supervision from his ex-wife. Patient adequate for d/c from mobility standpoint.  Follow Up Recommendations  No PT follow up     Equipment Recommendations  3in1 (PT)    Recommendations for Other Services OT consult     Precautions / Restrictions Precautions Precautions: None Precaution Comments: Wound vac Restrictions Weight Bearing Restrictions: Yes LLE Weight Bearing: Weight bearing as tolerated    Mobility  Bed Mobility                  Transfers Overall transfer level: Needs assistance Equipment used: Rolling walker (2 wheeled);Quad cane Transfers: Sit to/from Stand Sit to Stand: Supervision         General transfer comment: Practiced sit<>stand with RW and quad cane from reclining chair. VC for hand placement but demonstrates good balance and no physical assist needed.  Ambulation/Gait Ambulation/Gait assistance: Supervision Ambulation Distance (Feet): 80 Feet Assistive device: Quad cane Gait Pattern/deviations: Step-through pattern;Decreased stride length   Gait velocity interpretation: Below normal speed for age/gender General Gait Details: Educated on safe DME use with quad cane. No loss of balance noted and demonstrates good stability.   Stairs Stairs: Yes Stairs assistance: Supervision Stair Management:  One rail Left;Step to pattern;Forwards Number of Stairs: 2 (x2) General stair comments: Supervision for safety, VC for sequencing. Pt able to teach back correct technique for ascending/descending stairs with use of single rail. Safely performs this task with no further questions.  Wheelchair Mobility    Modified Rankin (Stroke Patients Only)       Balance                                    Cognition Arousal/Alertness: Awake/alert Behavior During Therapy: WFL for tasks assessed/performed Overall Cognitive Status: Within Functional Limits for tasks assessed                      Exercises General Exercises - Lower Extremity Ankle Circles/Pumps: AROM;Both;10 reps;Seated    General Comments        Pertinent Vitals/Pain Pain Assessment: No/denies pain    Home Living                      Prior Function            PT Goals (current goals can now be found in the care plan section) Acute Rehab PT Goals Patient Stated Goal: Go home PT Goal Formulation: With patient Time For Goal Achievement: 11/11/13 Potential to Achieve Goals: Good Progress towards PT goals: Progressing toward goals    Frequency  Min 5X/week    PT Plan Current plan remains appropriate    Co-evaluation             End of Session  Activity Tolerance: Patient tolerated treatment well Patient left: in chair;with call bell/phone within reach     Time: 1209-1223 PT Time Calculation (min): 14 min  Charges:  $Gait Training: 8-22 mins                    G Codes:      Elayne Snare, Long  Ellouise Newer 11/04/2013, 2:56 PM

## 2013-11-04 NOTE — Evaluation (Signed)
Physical Therapy Evaluation Patient Details Name: Alan Orr MRN: [id number] DOB: 1945/09/12 Today's Date: 11/04/2013   History of Present Illness  68 y.o. male s/p Left Leg Debride Ulcer, Apply Wound VAC and Theraskin. Hx paroxysmal atrial fibrillation, CAD, CABG x5,  DM, and s/p ICD placement.  Clinical Impression  Patient is seen following the above procedure and presents with functional limitations due to the deficits listed below (see PT Problem List). Ambulates generally well up to 150 feet with rolling walker. Plan for stair training at next therapy session. Pt motivated to improve so he can return home safely. Patient will benefit from skilled PT to increase their independence and safety with mobility to allow discharge to the venue listed below.       Follow Up Recommendations No PT follow up    Equipment Recommendations  3in1 (PT)    Recommendations for Other Services OT consult     Precautions / Restrictions Precautions Precautions: None Precaution Comments: Wound vac Restrictions Weight Bearing Restrictions: Yes LLE Weight Bearing: Weight bearing as tolerated      Mobility  Bed Mobility Overal bed mobility: Needs Assistance Bed Mobility: Supine to Sit     Supine to sit: Supervision     General bed mobility comments: Supervision for safety and for assist with equipment. No physical assist needed for pt mobility.  Transfers Overall transfer level: Needs assistance Equipment used: Rolling walker (2 wheeled) Transfers: Sit to/from Stand Sit to Stand: Supervision         General transfer comment: Supervision for safety from lowest bed setting. VC for hand placement.  Ambulation/Gait Ambulation/Gait assistance: Supervision Ambulation Distance (Feet): 150 Feet Assistive device: Rolling walker (2 wheeled) Gait Pattern/deviations: Step-through pattern;Decreased stride length   Gait velocity interpretation: Below normal speed for age/gender General  Gait Details: Educated on safe DME use with VC for forward gaze. Demonstrates good control of RW. No loss of balance   Stairs            Wheelchair Mobility    Modified Rankin (Stroke Patients Only)       Balance Overall balance assessment: Needs assistance Sitting-balance support: No upper extremity supported;Feet supported Sitting balance-Leahy Scale: Good     Standing balance support: No upper extremity supported Standing balance-Leahy Scale: Fair                               Pertinent Vitals/Pain Pain Assessment: 0-10 Pain Score: 3  Pain Location: left ankle - "below the wound" Pain Intervention(s): Limited activity within patient's tolerance;Monitored during session;Repositioned    Home Living Family/patient expects to be discharged to:: Private residence Living Arrangements: Spouse/significant other Available Help at Discharge: Family;Available 24 hours/day Type of Home: Mobile home Home Access: Stairs to enter Entrance Stairs-Rails: Right;Left;Can reach both Entrance Stairs-Number of Steps: 4 Home Layout: One level Home Equipment: Walker - 2 wheels;Shower seat - built in      Prior Function Level of Independence: Independent               Hand Dominance   Dominant Hand: Right    Extremity/Trunk Assessment   Upper Extremity Assessment: Defer to OT evaluation           Lower Extremity Assessment: Overall WFL for tasks assessed         Communication   Communication: HOH  Cognition Arousal/Alertness: Awake/alert Behavior During Therapy: WFL for tasks assessed/performed Overall Cognitive Status: Within Functional Limits for  tasks assessed                      General Comments General comments (skin integrity, edema, etc.): Wound vac applied to LLE. Medial to tiba. Complains of pain below this site between ankle and wound.    Exercises General Exercises - Lower Extremity Ankle Circles/Pumps: AROM;Both;10  reps;Seated      Assessment/Plan    PT Assessment Patient needs continued PT services  PT Diagnosis Difficulty walking;Abnormality of gait;Acute pain   PT Problem List Decreased strength;Decreased range of motion;Decreased activity tolerance;Decreased balance;Decreased mobility;Decreased knowledge of use of DME;Pain;Decreased skin integrity  PT Treatment Interventions DME instruction;Gait training;Stair training;Functional mobility training;Therapeutic activities;Therapeutic exercise;Balance training;Neuromuscular re-education;Patient/family education;Modalities   PT Goals (Current goals can be found in the Care Plan section) Acute Rehab PT Goals Patient Stated Goal: Go home PT Goal Formulation: With patient Time For Goal Achievement: 11/11/13 Potential to Achieve Goals: Good    Frequency Min 5X/week   Barriers to discharge        Co-evaluation               End of Session   Activity Tolerance: Patient tolerated treatment well Patient left: in chair;with call bell/phone within reach Nurse Communication: Mobility status    Functional Assessment Tool Used: clinical observation Functional Limitation: Mobility: Walking and moving around Mobility: Walking and Moving Around Current Status ([id number]): At least 1 percent but less than 20 percent impaired, limited or restricted Mobility: Walking and Moving Around Goal Status [phone]): 0 percent impaired, limited or restricted    Time: 0834-0906 PT Time Calculation (min): 32 min   Charges:   PT Evaluation $Initial PT Evaluation Tier I: 1 Procedure PT Treatments $Therapeutic Activity: 8-22 mins   PT G Codes:   Functional Assessment Tool Used: clinical observation Functional Limitation: Mobility: Walking and moving around  [place], [place]   Ellouise Newer 11/04/2013, 9:53 AM

## 2013-11-04 NOTE — Progress Notes (Signed)
Subjective: Pt stable - pain ok   Objective: Vital signs in last 24 hours: Temp:  [97.5 F (36.4 C)-98.3 F (36.8 C)] 97.6 F (36.4 C) (08/22 0626) Pulse Rate:  [50-66] 54 (08/22 0626) Resp:  [16-18] 16 (08/22 0626) BP: (113-128)/(43-55) 118/48 mmHg (08/22 0626) SpO2:  [96 %-100 %] 96 % (08/22 0626) Weight:  [74.532 kg (164 lb 5 oz)] 74.532 kg (164 lb 5 oz) (08/21 0925)  Intake/Output from previous day: 08/21 0701 - 08/22 0700 In: 600 [I.V.:600] Out: 625 [Urine:600; Blood:25] Intake/Output this shift:    Exam:  wound vac on - to be dced  Labs:  Recent Labs  11/03/13 1103  HGB 10.8*    Recent Labs  11/03/13 1103  WBC 5.5  RBC 3.21*  HCT 32.8*  PLT 216    Recent Labs  11/03/13 1103  NA 139  K 4.2  CL 104  CO2 27  BUN 16  CREATININE 1.42*  GLUCOSE 88  CALCIUM 8.8    Recent Labs  11/03/13 1103  INR 1.36    Assessment/Plan: Plan dc to home this am   Lloyd Cullinan SCOTT 11/04/2013, 8:59 AM

## 2013-11-06 ENCOUNTER — Encounter (HOSPITAL_COMMUNITY): Payer: Self-pay | Admitting: Orthopedic Surgery

## 2013-11-06 NOTE — Discharge Summary (Signed)
  Final diagnosis open wound left leg. Surgical procedure excision skin soft tissue and muscle with local tissue rearrangement for wound closure. Placement of allograft skin graft. Patient was discharged to home in stable condition. Followup in the office in one week.

## 2013-11-06 NOTE — Care Management Note (Signed)
CARE MANAGEMENT NOTE 11/06/2013  Patient:  Alan Orr, Alan Orr   Account Number:  [id number]  Date Initiated:  11/06/2013  Documentation initiated by:  Ricki Miller  Subjective/Objective Assessment:   68yr old male admitted for excision of left leg deep hematoma, with allograft and vac placement.     Action/Plan:   patient assessed by physical therapy. No home health needed.   Anticipated DC Date:  11/05/2013   Anticipated DC Plan:  [place]  CM consult      PAC Choice  NA   Choice offered to / List presented to:     DME arranged  NA        HH arranged  NA      Status of service:  Completed, signed off Medicare Important Message given?   (If response is "NO", the following Medicare IM given date fields will be blank) Date Medicare IM given:   Medicare IM given by:   Date Additional Medicare IM given:   Additional Medicare IM given by:    Discharge Disposition:  HOME/SELF CARE

## 2013-11-07 ENCOUNTER — Encounter: Payer: Self-pay | Admitting: Cardiology

## 2013-11-13 ENCOUNTER — Encounter: Payer: Self-pay | Admitting: Cardiovascular Disease

## 2013-12-07 ENCOUNTER — Other Ambulatory Visit (HOSPITAL_COMMUNITY): Payer: Self-pay | Admitting: Internal Medicine

## 2013-12-07 DIAGNOSIS — I2510 Atherosclerotic heart disease of native coronary artery without angina pectoris: Secondary | ICD-10-CM | POA: Diagnosis not present

## 2013-12-07 DIAGNOSIS — R634 Abnormal weight loss: Secondary | ICD-10-CM | POA: Diagnosis not present

## 2013-12-07 DIAGNOSIS — Z79899 Other long term (current) drug therapy: Secondary | ICD-10-CM | POA: Diagnosis not present

## 2013-12-07 DIAGNOSIS — Z87891 Personal history of nicotine dependence: Secondary | ICD-10-CM

## 2013-12-07 DIAGNOSIS — IMO0002 Reserved for concepts with insufficient information to code with codable children: Secondary | ICD-10-CM | POA: Diagnosis not present

## 2013-12-07 DIAGNOSIS — R6881 Early satiety: Secondary | ICD-10-CM

## 2013-12-07 DIAGNOSIS — G8929 Other chronic pain: Secondary | ICD-10-CM | POA: Diagnosis not present

## 2013-12-07 DIAGNOSIS — D649 Anemia, unspecified: Secondary | ICD-10-CM | POA: Diagnosis not present

## 2013-12-08 ENCOUNTER — Other Ambulatory Visit (HOSPITAL_COMMUNITY): Payer: Self-pay | Admitting: Internal Medicine

## 2013-12-08 DIAGNOSIS — R6881 Early satiety: Secondary | ICD-10-CM

## 2013-12-08 DIAGNOSIS — R222 Localized swelling, mass and lump, trunk: Secondary | ICD-10-CM

## 2013-12-08 DIAGNOSIS — R059 Cough, unspecified: Secondary | ICD-10-CM

## 2013-12-08 DIAGNOSIS — R634 Abnormal weight loss: Secondary | ICD-10-CM

## 2013-12-08 DIAGNOSIS — R05 Cough: Secondary | ICD-10-CM

## 2013-12-08 DIAGNOSIS — R509 Fever, unspecified: Secondary | ICD-10-CM

## 2013-12-08 DIAGNOSIS — Z87891 Personal history of nicotine dependence: Secondary | ICD-10-CM

## 2013-12-12 ENCOUNTER — Ambulatory Visit (HOSPITAL_COMMUNITY)
Admission: RE | Admit: 2013-12-12 | Discharge: 2013-12-12 | Disposition: A | Discharge status: Home / Self Care | Payer: Medicare Other | Source: Ambulatory Visit | Attending: Internal Medicine | Admitting: Internal Medicine

## 2013-12-12 ENCOUNTER — Ambulatory Visit (HOSPITAL_COMMUNITY): Payer: Medicare Other

## 2013-12-12 DIAGNOSIS — I2510 Atherosclerotic heart disease of native coronary artery without angina pectoris: Secondary | ICD-10-CM | POA: Insufficient documentation

## 2013-12-12 DIAGNOSIS — R634 Abnormal weight loss: Secondary | ICD-10-CM | POA: Insufficient documentation

## 2013-12-12 DIAGNOSIS — R911 Solitary pulmonary nodule: Secondary | ICD-10-CM | POA: Diagnosis not present

## 2013-12-12 DIAGNOSIS — R509 Fever, unspecified: Secondary | ICD-10-CM

## 2013-12-12 DIAGNOSIS — E119 Type 2 diabetes mellitus without complications: Secondary | ICD-10-CM | POA: Insufficient documentation

## 2013-12-12 DIAGNOSIS — Z951 Presence of aortocoronary bypass graft: Secondary | ICD-10-CM | POA: Insufficient documentation

## 2013-12-12 DIAGNOSIS — R059 Cough, unspecified: Secondary | ICD-10-CM

## 2013-12-12 DIAGNOSIS — I10 Essential (primary) hypertension: Secondary | ICD-10-CM | POA: Insufficient documentation

## 2013-12-12 DIAGNOSIS — R6881 Early satiety: Secondary | ICD-10-CM

## 2013-12-12 DIAGNOSIS — K838 Other specified diseases of biliary tract: Secondary | ICD-10-CM | POA: Insufficient documentation

## 2013-12-12 DIAGNOSIS — Z87891 Personal history of nicotine dependence: Secondary | ICD-10-CM | POA: Diagnosis not present

## 2013-12-12 DIAGNOSIS — R222 Localized swelling, mass and lump, trunk: Secondary | ICD-10-CM

## 2013-12-12 DIAGNOSIS — R05 Cough: Secondary | ICD-10-CM

## 2013-12-12 MED ORDER — IOHEXOL 300 MG/ML  SOLN
100.0000 mL | Freq: Once | INTRAMUSCULAR | Status: AC | PRN
  Administered 2013-12-12: 100 mL via INTRAVENOUS

## 2013-12-14 ENCOUNTER — Other Ambulatory Visit: Payer: Self-pay | Admitting: Cardiovascular Disease

## 2013-12-14 ENCOUNTER — Other Ambulatory Visit (HOSPITAL_COMMUNITY): Payer: Self-pay | Admitting: Cardiovascular Disease

## 2013-12-14 NOTE — Telephone Encounter (Signed)
Rx was sent to pharmacy electronically. 

## 2014-01-03 DIAGNOSIS — Z1211 Encounter for screening for malignant neoplasm of colon: Secondary | ICD-10-CM | POA: Diagnosis not present

## 2014-01-09 ENCOUNTER — Telehealth (HOSPITAL_COMMUNITY): Payer: Self-pay | Admitting: *Deleted

## 2014-01-15 ENCOUNTER — Other Ambulatory Visit (HOSPITAL_COMMUNITY): Payer: Self-pay | Admitting: Cardiovascular Disease

## 2014-01-25 DIAGNOSIS — E1151 Type 2 diabetes mellitus with diabetic peripheral angiopathy without gangrene: Secondary | ICD-10-CM | POA: Diagnosis not present

## 2014-01-25 DIAGNOSIS — L03032 Cellulitis of left toe: Secondary | ICD-10-CM | POA: Diagnosis not present

## 2014-01-26 ENCOUNTER — Ambulatory Visit (HOSPITAL_COMMUNITY)
Admission: RE | Admit: 2014-01-26 | Discharge: 2014-01-26 | Disposition: A | Discharge status: Home / Self Care | Payer: Medicare Other | Source: Ambulatory Visit | Attending: Internal Medicine | Admitting: Internal Medicine

## 2014-01-26 DIAGNOSIS — I739 Peripheral vascular disease, unspecified: Secondary | ICD-10-CM | POA: Insufficient documentation

## 2014-01-26 NOTE — Progress Notes (Signed)
Lower Extremity Arterial Duplex Completed. °Brianna L Mazza,RVT °

## 2014-01-29 ENCOUNTER — Telehealth: Payer: Self-pay | Admitting: Cardiology

## 2014-01-29 ENCOUNTER — Telehealth: Payer: Self-pay | Admitting: *Deleted

## 2014-01-29 ENCOUNTER — Ambulatory Visit (INDEPENDENT_AMBULATORY_CARE_PROVIDER_SITE_OTHER): Payer: Medicare Other | Admitting: *Deleted

## 2014-01-29 DIAGNOSIS — I5022 Chronic systolic (congestive) heart failure: Secondary | ICD-10-CM

## 2014-01-29 DIAGNOSIS — I472 Ventricular tachycardia, unspecified: Secondary | ICD-10-CM

## 2014-01-29 DIAGNOSIS — I255 Ischemic cardiomyopathy: Secondary | ICD-10-CM

## 2014-01-29 NOTE — Telephone Encounter (Signed)
LMOVM updating pt [employer] upgraded their home monitor system today. I recommended sending a manual transmission tomorrow.

## 2014-01-29 NOTE — Telephone Encounter (Signed)
Spoke with pt and reminded pt of remote transmission that is due today. Pt verbalized understanding.   

## 2014-01-30 ENCOUNTER — Encounter: Payer: Self-pay | Admitting: Cardiovascular Disease

## 2014-01-30 DIAGNOSIS — I5022 Chronic systolic (congestive) heart failure: Secondary | ICD-10-CM | POA: Diagnosis not present

## 2014-01-30 DIAGNOSIS — I472 Ventricular tachycardia: Secondary | ICD-10-CM | POA: Diagnosis not present

## 2014-01-30 DIAGNOSIS — I255 Ischemic cardiomyopathy: Secondary | ICD-10-CM | POA: Diagnosis not present

## 2014-01-30 LAB — MDC_IDC_ENUM_SESS_TYPE_REMOTE
Brady Statistic AP VP Percent: 79.33 %
Brady Statistic AP VS Percent: 0.27 %
Brady Statistic AS VP Percent: 20.39 %
Brady Statistic AS VS Percent: 0.01 %
Brady Statistic RA Percent Paced: 79.59 %
Date Time Interrogation Session: 20151117151540
HIGH POWER IMPEDANCE MEASURED VALUE: 42 Ohm
HighPow Impedance: 50 Ohm
Lead Channel Impedance Value: 494 Ohm
Lead Channel Impedance Value: 494 Ohm
Lead Channel Impedance Value: 532 Ohm
Lead Channel Impedance Value: 855 Ohm
Lead Channel Pacing Threshold Amplitude: 0.875 V
Lead Channel Pacing Threshold Pulse Width: 0.4 ms
Lead Channel Sensing Intrinsic Amplitude: 0.625 mV
Lead Channel Sensing Intrinsic Amplitude: 9.375 mV
Lead Channel Setting Pacing Amplitude: 2.5 V
Lead Channel Setting Pacing Pulse Width: 0.4 ms
Lead Channel Setting Pacing Pulse Width: 0.4 ms
Lead Channel Setting Sensing Sensitivity: 0.3 mV
MDC IDC MSMT BATTERY VOLTAGE: 2.92 V
MDC IDC MSMT LEADCHNL RA SENSING INTR AMPL: 0.625 mV
MDC IDC MSMT LEADCHNL RV IMPEDANCE VALUE: 532 Ohm
MDC IDC MSMT LEADCHNL RV SENSING INTR AMPL: 9.375 mV
MDC IDC SET LEADCHNL LV PACING AMPLITUDE: 2 V
MDC IDC SET LEADCHNL RA PACING AMPLITUDE: 2.5 V
MDC IDC SET ZONE DETECTION INTERVAL: 250 ms
MDC IDC SET ZONE DETECTION INTERVAL: 370 ms
MDC IDC STAT BRADY RV PERCENT PACED: 99.72 %
Zone Setting Detection Interval: 290 ms
Zone Setting Detection Interval: 350 ms
Zone Setting Detection Interval: 450 ms

## 2014-01-30 NOTE — Telephone Encounter (Signed)
Pt sent transmission this am, yesterday didn't take was to send again today, calling to see if it went through this time , pls advise [phone]

## 2014-01-30 NOTE — Telephone Encounter (Signed)
LMOVM informing pt that we did receive transmission.

## 2014-01-30 NOTE — Progress Notes (Signed)
Remote ICD transmission.   

## 2014-02-02 ENCOUNTER — Encounter: Payer: Self-pay | Admitting: Cardiology

## 2014-02-12 DIAGNOSIS — N189 Chronic kidney disease, unspecified: Secondary | ICD-10-CM | POA: Diagnosis not present

## 2014-02-12 DIAGNOSIS — I10 Essential (primary) hypertension: Secondary | ICD-10-CM | POA: Diagnosis not present

## 2014-02-12 DIAGNOSIS — E1129 Type 2 diabetes mellitus with other diabetic kidney complication: Secondary | ICD-10-CM | POA: Diagnosis not present

## 2014-03-01 ENCOUNTER — Other Ambulatory Visit (HOSPITAL_COMMUNITY): Payer: Self-pay | Admitting: Nephrology

## 2014-03-01 DIAGNOSIS — N183 Chronic kidney disease, stage 3 unspecified: Secondary | ICD-10-CM

## 2014-03-02 DIAGNOSIS — Z6822 Body mass index (BMI) 22.0-22.9, adult: Secondary | ICD-10-CM | POA: Diagnosis not present

## 2014-03-02 DIAGNOSIS — I10 Essential (primary) hypertension: Secondary | ICD-10-CM | POA: Diagnosis not present

## 2014-03-02 DIAGNOSIS — G894 Chronic pain syndrome: Secondary | ICD-10-CM | POA: Diagnosis not present

## 2014-03-02 DIAGNOSIS — R42 Dizziness and giddiness: Secondary | ICD-10-CM | POA: Diagnosis not present

## 2014-03-02 DIAGNOSIS — E782 Mixed hyperlipidemia: Secondary | ICD-10-CM | POA: Diagnosis not present

## 2014-03-13 ENCOUNTER — Other Ambulatory Visit (HOSPITAL_COMMUNITY): Payer: Self-pay | Admitting: Cardiovascular Disease

## 2014-03-13 NOTE — Telephone Encounter (Signed)
Rx(s) sent to pharmacy electronically.  

## 2014-03-22 ENCOUNTER — Ambulatory Visit (HOSPITAL_COMMUNITY)
Admission: RE | Admit: 2014-03-22 | Discharge: 2014-03-22 | Disposition: A | Discharge status: Home / Self Care | Payer: Medicare Other | Source: Ambulatory Visit | Attending: Nephrology | Admitting: Nephrology

## 2014-03-22 DIAGNOSIS — E559 Vitamin D deficiency, unspecified: Secondary | ICD-10-CM | POA: Diagnosis not present

## 2014-03-22 DIAGNOSIS — N183 Chronic kidney disease, stage 3 unspecified: Secondary | ICD-10-CM

## 2014-03-22 DIAGNOSIS — I10 Essential (primary) hypertension: Secondary | ICD-10-CM | POA: Diagnosis not present

## 2014-03-22 DIAGNOSIS — N189 Chronic kidney disease, unspecified: Secondary | ICD-10-CM | POA: Insufficient documentation

## 2014-03-22 DIAGNOSIS — Z79899 Other long term (current) drug therapy: Secondary | ICD-10-CM | POA: Diagnosis not present

## 2014-03-22 DIAGNOSIS — R809 Proteinuria, unspecified: Secondary | ICD-10-CM | POA: Diagnosis not present

## 2014-03-22 DIAGNOSIS — D649 Anemia, unspecified: Secondary | ICD-10-CM | POA: Diagnosis not present

## 2014-03-22 DIAGNOSIS — N200 Calculus of kidney: Secondary | ICD-10-CM | POA: Diagnosis not present

## 2014-04-02 ENCOUNTER — Other Ambulatory Visit (HOSPITAL_COMMUNITY): Payer: Self-pay | Admitting: Internal Medicine

## 2014-04-02 DIAGNOSIS — J984 Other disorders of lung: Secondary | ICD-10-CM

## 2014-04-02 DIAGNOSIS — Z6822 Body mass index (BMI) 22.0-22.9, adult: Secondary | ICD-10-CM | POA: Diagnosis not present

## 2014-04-02 DIAGNOSIS — R911 Solitary pulmonary nodule: Secondary | ICD-10-CM

## 2014-04-02 DIAGNOSIS — I10 Essential (primary) hypertension: Secondary | ICD-10-CM | POA: Diagnosis not present

## 2014-04-02 DIAGNOSIS — E119 Type 2 diabetes mellitus without complications: Secondary | ICD-10-CM | POA: Diagnosis not present

## 2014-04-04 ENCOUNTER — Ambulatory Visit (HOSPITAL_COMMUNITY): Payer: Medicare Other

## 2014-04-09 DIAGNOSIS — R809 Proteinuria, unspecified: Secondary | ICD-10-CM | POA: Diagnosis not present

## 2014-04-09 DIAGNOSIS — I10 Essential (primary) hypertension: Secondary | ICD-10-CM | POA: Diagnosis not present

## 2014-04-09 DIAGNOSIS — N182 Chronic kidney disease, stage 2 (mild): Secondary | ICD-10-CM | POA: Diagnosis not present

## 2014-04-09 DIAGNOSIS — E1129 Type 2 diabetes mellitus with other diabetic kidney complication: Secondary | ICD-10-CM | POA: Diagnosis not present

## 2014-04-16 ENCOUNTER — Ambulatory Visit (HOSPITAL_COMMUNITY): Payer: Medicare Other

## 2014-04-17 ENCOUNTER — Telehealth (HOSPITAL_COMMUNITY): Payer: Self-pay | Admitting: *Deleted

## 2014-04-20 ENCOUNTER — Ambulatory Visit (HOSPITAL_COMMUNITY): Payer: Medicare Other

## 2014-04-24 ENCOUNTER — Encounter: Payer: Medicare Other | Admitting: Cardiovascular Disease

## 2014-05-23 ENCOUNTER — Other Ambulatory Visit (HOSPITAL_COMMUNITY): Payer: Self-pay | Admitting: Cardiovascular Disease

## 2014-05-23 NOTE — Telephone Encounter (Signed)
Rx(s) sent to pharmacy electronically.  

## 2014-06-25 ENCOUNTER — Other Ambulatory Visit: Payer: Self-pay | Admitting: Cardiovascular Disease

## 2014-06-27 ENCOUNTER — Ambulatory Visit (HOSPITAL_COMMUNITY): Payer: Medicare Other | Admitting: Hematology & Oncology

## 2014-06-27 DIAGNOSIS — G894 Chronic pain syndrome: Secondary | ICD-10-CM | POA: Diagnosis not present

## 2014-06-27 DIAGNOSIS — I10 Essential (primary) hypertension: Secondary | ICD-10-CM | POA: Diagnosis not present

## 2014-06-27 DIAGNOSIS — Z6821 Body mass index (BMI) 21.0-21.9, adult: Secondary | ICD-10-CM | POA: Diagnosis not present

## 2014-06-28 ENCOUNTER — Ambulatory Visit (HOSPITAL_COMMUNITY): Payer: Medicare Other | Admitting: Hematology & Oncology

## 2014-06-29 ENCOUNTER — Ambulatory Visit: Payer: Medicare Other | Admitting: Cardiovascular Disease

## 2014-07-12 ENCOUNTER — Other Ambulatory Visit: Payer: Self-pay | Admitting: Cardiovascular Disease

## 2014-07-16 ENCOUNTER — Emergency Department (HOSPITAL_COMMUNITY)
Admission: EM | Admit: 2014-07-16 | Discharge: 2014-07-16 | Disposition: A | Discharge status: Home / Self Care | Payer: Medicare Other | Attending: Emergency Medicine | Admitting: Emergency Medicine

## 2014-07-16 ENCOUNTER — Encounter (HOSPITAL_COMMUNITY): Payer: Self-pay | Admitting: *Deleted

## 2014-07-16 ENCOUNTER — Emergency Department (HOSPITAL_COMMUNITY): Payer: Medicare Other

## 2014-07-16 ENCOUNTER — Ambulatory Visit (HOSPITAL_COMMUNITY): Payer: Medicare Other | Admitting: Hematology & Oncology

## 2014-07-16 DIAGNOSIS — Z87891 Personal history of nicotine dependence: Secondary | ICD-10-CM | POA: Diagnosis not present

## 2014-07-16 DIAGNOSIS — I10 Essential (primary) hypertension: Secondary | ICD-10-CM | POA: Insufficient documentation

## 2014-07-16 DIAGNOSIS — I2510 Atherosclerotic heart disease of native coronary artery without angina pectoris: Secondary | ICD-10-CM | POA: Insufficient documentation

## 2014-07-16 DIAGNOSIS — Z86718 Personal history of other venous thrombosis and embolism: Secondary | ICD-10-CM | POA: Diagnosis not present

## 2014-07-16 DIAGNOSIS — Z79899 Other long term (current) drug therapy: Secondary | ICD-10-CM | POA: Diagnosis not present

## 2014-07-16 DIAGNOSIS — E785 Hyperlipidemia, unspecified: Secondary | ICD-10-CM | POA: Diagnosis not present

## 2014-07-16 DIAGNOSIS — Z951 Presence of aortocoronary bypass graft: Secondary | ICD-10-CM | POA: Insufficient documentation

## 2014-07-16 DIAGNOSIS — Z7982 Long term (current) use of aspirin: Secondary | ICD-10-CM | POA: Diagnosis not present

## 2014-07-16 DIAGNOSIS — Y9389 Activity, other specified: Secondary | ICD-10-CM | POA: Insufficient documentation

## 2014-07-16 DIAGNOSIS — Z8673 Personal history of transient ischemic attack (TIA), and cerebral infarction without residual deficits: Secondary | ICD-10-CM | POA: Insufficient documentation

## 2014-07-16 DIAGNOSIS — S4991XA Unspecified injury of right shoulder and upper arm, initial encounter: Secondary | ICD-10-CM | POA: Diagnosis present

## 2014-07-16 DIAGNOSIS — I480 Paroxysmal atrial fibrillation: Secondary | ICD-10-CM | POA: Diagnosis not present

## 2014-07-16 DIAGNOSIS — M25511 Pain in right shoulder: Secondary | ICD-10-CM | POA: Diagnosis not present

## 2014-07-16 DIAGNOSIS — Z9889 Other specified postprocedural states: Secondary | ICD-10-CM | POA: Insufficient documentation

## 2014-07-16 DIAGNOSIS — S40011A Contusion of right shoulder, initial encounter: Secondary | ICD-10-CM

## 2014-07-16 DIAGNOSIS — E119 Type 2 diabetes mellitus without complications: Secondary | ICD-10-CM | POA: Insufficient documentation

## 2014-07-16 DIAGNOSIS — W2201XA Walked into wall, initial encounter: Secondary | ICD-10-CM | POA: Insufficient documentation

## 2014-07-16 DIAGNOSIS — Y998 Other external cause status: Secondary | ICD-10-CM | POA: Insufficient documentation

## 2014-07-16 DIAGNOSIS — Y9289 Other specified places as the place of occurrence of the external cause: Secondary | ICD-10-CM | POA: Insufficient documentation

## 2014-07-16 HISTORY — DX: Acute embolism and thrombosis of unspecified deep veins of unspecified lower extremity: I82.409

## 2014-07-16 NOTE — ED Notes (Signed)
Pt says he stumbled last night "light was not on" and hit his rt shoulder. Pain lt shoulder,   On the way here, hit is forearm on ?a nail  Cut his rt forarm.

## 2014-07-16 NOTE — ED Provider Notes (Signed)
CSN: [id number]     Arrival date & time 07/16/14  1344 History   First MD Initiated Contact with Patient 07/16/14 1503     Chief Complaint  Patient presents with  . Shoulder Pain     (Consider location/radiation/quality/duration/timing/severity/associated sxs/prior Treatment) Patient is a 69 y.o. male presenting with shoulder pain. The history is provided by the patient (the pt fell against the wall and hurt his right shoulder).  Shoulder Pain Location:  Shoulder Injury: yes   Mechanism of injury: fall   Fall:    Impact surface: wall.   Entrapped after fall: no   Shoulder location:  R shoulder Pain details:    Quality:  Dull   Radiates to:  Does not radiate Associated symptoms: no back pain and no fatigue     Past Medical History  Diagnosis Date  . Ischemic cardiomyopathy   . CAD (coronary artery disease)   . S/P CABG x 5   . PAF (paroxysmal atrial fibrillation)   . Hypertension   . Hyperlipidemia   . RBBB   . ICD (implantable cardioverter-defibrillator), single, in situ   . DM (diabetes mellitus)     type 2   . DVT (deep venous thrombosis)    Past Surgical History  Procedure Laterality Date  . Coronary artery bypass graft  12/31/1995    LIMA to LAD,SVG to intermediate,SVG to CX,seq. svg to posterior descending and posterolateral RCA  . Cardiac catheterization  05/08/2009    small vessel disease  . Cardiac defibrillator placement  05/09/2009    [employer]  . Back surgery    . I&d extremity Left 11/03/2013    Procedure: Left Leg Debride Ulcer, Apply Wound VAC and Theraskin;  Surgeon: Newt Minion, MD;  Location: [place];  Service: Orthopedics;  Laterality: Left;   History reviewed. No pertinent family history. History  Substance Use Topics  . Smoking status: Former Smoker    Types: Cigarettes    Quit date: 11/03/1995  . Smokeless tobacco: Never Used  . Alcohol Use: No    Review of Systems  Constitutional: Negative for appetite change and fatigue.  HENT:  Negative for congestion, ear discharge and sinus pressure.   Eyes: Negative for discharge.  Respiratory: Negative for cough.   Cardiovascular: Negative for chest pain.  Gastrointestinal: Negative for abdominal pain and diarrhea.  Genitourinary: Negative for frequency and hematuria.  Musculoskeletal: Negative for back pain.       Right shoulder pain  Skin: Negative for rash.  Neurological: Negative for seizures and headaches.  Psychiatric/Behavioral: Negative for hallucinations.      Allergies  Review of patient's allergies indicates no known allergies.  Home Medications   Prior to Admission medications   Medication Sig Start Date End Date Taking? Authorizing Provider  acetaminophen (TYLENOL) 500 MG tablet Take 500 mg by mouth daily.    Historical Provider, MD  ALPRAZolam Duanne Moron) 1 MG tablet Take 1 mg by mouth at bedtime as needed for sleep.    Historical Provider, MD  amiodarone (PACERONE) 200 MG tablet Take 200 mg by mouth daily.    Historical Provider, MD  aspirin EC 81 MG tablet Take 81 mg by mouth at bedtime.    Historical Provider, MD  atorvastatin (LIPITOR) 40 MG tablet Take 40 mg by mouth daily.    Historical Provider, MD  digoxin (LANOXIN) 0.125 MG tablet Take 0.125 mg by mouth daily.     Historical Provider, MD  doxycycline (VIBRAMYCIN) 100 MG capsule Take 100 mg by mouth  2 (two) times daily. 10 day course filled 10/27/13    Historical Provider, MD  levothyroxine (SYNTHROID, LEVOTHROID) 112 MCG tablet Take 112 mcg by mouth daily before breakfast.    Historical Provider, MD  magnesium oxide (MAG-OX) 400 MG tablet Take 400 mg by mouth daily.    Historical Provider, MD  metFORMIN (GLUCOPHAGE) 500 MG tablet Take 500 mg by mouth 2 (two) times daily with a meal.    Historical Provider, MD  metoprolol (LOPRESSOR) 50 MG tablet TAKE 1 TABLET BY MOUTH TWICE DAILY. 06/25/14   Lorretta Harp, MD  niacin (NIASPAN) 1000 MG CR tablet TAKE (1) TABLET BY MOUTH AT BEDTIME. 06/25/14   Lorretta Harp, MD  nitroGLYCERIN (NITROSTAT) 0.4 MG SL tablet Place 0.4 mg under the tongue every 5 (five) minutes as needed for chest pain.    Historical Provider, MD  oxyCODONE-acetaminophen (PERCOCET) 10-325 MG per tablet Take 1 tablet by mouth every 4 (four) hours as needed for pain.    Historical Provider, MD  pioglitazone (ACTOS) 45 MG tablet Take 45 mg by mouth daily.    Historical Provider, MD  PRADAXA 75 MG CAPS capsule TAKE 1 CAPSULE BY MOUTH EVERY 12 HOURS. 07/12/14   Troy Sine, MD  ramipril (ALTACE) 2.5 MG capsule Take 2.5 mg by mouth at bedtime.     Historical Provider, MD  ZETIA 10 MG tablet TAKE 1 TABLET ONCE DAILY FOR CHOLESTEROL. 06/25/14   Lorretta Harp, MD  zolpidem (AMBIEN) 10 MG tablet Take 10 mg by mouth at bedtime as needed for sleep.    Historical Provider, MD   BP 131/46 mmHg  Pulse 58  Temp(Src) 98.6 F (37 C) (Oral)  Resp 18  Ht 6\' 2"  (1.88 m)  Wt 230 lb (104.327 kg)  BMI 29.52 kg/m2  SpO2 96% Physical Exam  Constitutional: He is oriented to person, place, and time. He appears well-developed.  HENT:  Head: Normocephalic.  Eyes: Conjunctivae are normal.  Neck: No tracheal deviation present.  Cardiovascular:  No murmur heard. Musculoskeletal:  Tender right shoulder.  Decrease rom  Neurological: He is oriented to person, place, and time.  Skin: Skin is warm.  Psychiatric: He has a normal mood and affect.    ED Course  Procedures (including critical care time) Labs Review Labs Reviewed - No data to display  Imaging Review Dg Shoulder Right  07/16/2014   CLINICAL DATA:  69 year old male who stumbled and fell last night onto right shoulder with pain. Initial encounter.  EXAM: RIGHT SHOULDER - 2+ VIEW  COMPARISON:  Chest radiographs 11/03/2013.  FINDINGS: No glenohumeral joint dislocation. Subchondral sclerosis at the glenoid. Proximal right humerus intact. Right clavicle and scapula appear intact. Stable visible chest including CABG and AICD which are  partially visualized.  IMPRESSION: No acute fracture or dislocation identified about the right shoulder.   Electronically Signed   By: Genevie Ann M.D.   On: 07/16/2014 14:26     EKG Interpretation None      MDM   Final diagnoses:  Shoulder contusion, right, initial encounter    Contusion right shoulder    Milton Ferguson, MD 07/16/14 1531

## 2014-07-16 NOTE — ED Notes (Signed)
MD at bedside. 

## 2014-07-16 NOTE — Discharge Instructions (Signed)
Follow up with dr. Luna Glasgow this week or next

## 2014-07-24 ENCOUNTER — Other Ambulatory Visit: Payer: Self-pay | Admitting: Cardiovascular Disease

## 2014-07-24 NOTE — Telephone Encounter (Signed)
Rx(s) sent to pharmacy electronically. OV 08/15/2014

## 2014-07-31 ENCOUNTER — Encounter (HOSPITAL_COMMUNITY): Payer: Self-pay | Admitting: Hematology & Oncology

## 2014-07-31 ENCOUNTER — Encounter (HOSPITAL_COMMUNITY): Payer: Medicare Other | Attending: Hematology & Oncology | Admitting: Hematology & Oncology

## 2014-07-31 VITALS — BP 103/58 | HR 79 | Temp 97.5°F | Resp 22 | Ht 74.0 in | Wt 167.1 lb

## 2014-07-31 DIAGNOSIS — D649 Anemia, unspecified: Secondary | ICD-10-CM | POA: Diagnosis not present

## 2014-07-31 DIAGNOSIS — E119 Type 2 diabetes mellitus without complications: Secondary | ICD-10-CM

## 2014-07-31 DIAGNOSIS — N183 Chronic kidney disease, stage 3 (moderate): Secondary | ICD-10-CM

## 2014-07-31 DIAGNOSIS — R7989 Other specified abnormal findings of blood chemistry: Secondary | ICD-10-CM | POA: Diagnosis not present

## 2014-07-31 DIAGNOSIS — D472 Monoclonal gammopathy: Secondary | ICD-10-CM | POA: Insufficient documentation

## 2014-07-31 LAB — COMPREHENSIVE METABOLIC PANEL
ALBUMIN: 3.3 g/dL — AB (ref 3.5–5.0)
ALK PHOS: 78 U/L (ref 38–126)
ALT: 27 U/L (ref 17–63)
AST: 40 U/L (ref 15–41)
Anion gap: 8 (ref 5–15)
BILIRUBIN TOTAL: 0.6 mg/dL (ref 0.3–1.2)
BUN: 12 mg/dL (ref 6–20)
CHLORIDE: 101 mmol/L (ref 101–111)
CO2: 29 mmol/L (ref 22–32)
CREATININE: 1.33 mg/dL — AB (ref 0.61–1.24)
Calcium: 8.7 mg/dL — ABNORMAL LOW (ref 8.9–10.3)
GFR calc Af Amer: >60 mL/min (ref >60)
GFR, EST NON AFRICAN AMERICAN: 53 mL/min — AB (ref >60)
Glucose, Bld: 105 mg/dL — ABNORMAL HIGH (ref 65–99)
POTASSIUM: 3.7 mmol/L (ref 3.5–5.1)
Sodium: 138 mmol/L (ref 135–145)
Total Protein: 6.6 g/dL (ref 6.5–8.1)

## 2014-07-31 LAB — CBC WITH DIFFERENTIAL/PLATELET
BASOS PCT: 1 % (ref 0–1)
Basophils Absolute: 0.1 10*3/uL (ref 0.0–0.1)
EOS ABS: 0.2 10*3/uL (ref 0.0–0.7)
Eosinophils Relative: 5 % (ref 0–5)
HCT: 35.1 % — ABNORMAL LOW (ref 39.0–52.0)
HEMOGLOBIN: 11.1 g/dL — AB (ref 13.0–17.0)
Lymphocytes Relative: 43 % (ref 12–46)
Lymphs Abs: 2 10*3/uL (ref 0.7–4.0)
MCH: 32.9 pg (ref 26.0–34.0)
MCHC: 31.6 g/dL (ref 30.0–36.0)
MCV: 104.2 fL — ABNORMAL HIGH (ref 78.0–100.0)
MONO ABS: 0.5 10*3/uL (ref 0.1–1.0)
Monocytes Relative: 10 % (ref 3–12)
Neutro Abs: 2 10*3/uL (ref 1.7–7.7)
Neutrophils Relative %: 41 % — ABNORMAL LOW (ref 43–77)
PLATELETS: 281 10*3/uL (ref 150–400)
RBC: 3.37 MIL/uL — ABNORMAL LOW (ref 4.22–5.81)
RDW: 14.7 % (ref 11.5–15.5)
WBC: 4.8 10*3/uL (ref 4.0–10.5)

## 2014-07-31 NOTE — Patient Instructions (Signed)
..  [place] at [facility] Discharge Instructions  RECOMMENDATIONS MADE BY THE CONSULTANT AND ANY TEST RESULTS WILL BE SENT TO YOUR REFERRING PHYSICIAN.  Exam today per Dr. Whitney Muse  Please do bone survey in radiology prior to next visit and bring in 24 hr urine collection at that time  Thank you for choosing [place] at [facility] to provide your oncology and hematology care.  To afford each patient quality time with our provider, please arrive at least 15 minutes before your scheduled appointment time.    You need to re-schedule your appointment should you arrive 10 or more minutes late.  We strive to give you quality time with our providers, and arriving late affects you and other patients whose appointments are after yours.  Also, if you no show three or more times for appointments you may be dismissed from the clinic at the providers discretion.     Again, thank you for choosing [facility].  Our hope is that these requests will decrease the amount of time that you wait before being seen by our physicians.       _____________________________________________________________  Should you have questions after your visit to [facility], please contact our office at [phone] between the hours of 8:30 a.m. and 4:30 p.m.  Voicemails left after 4:30 p.m. will not be returned until the following business day.  For prescription refill requests, have your pharmacy contact our office.   24-Hour Urine Collection HOME CARE  When you get up in the morning on the day you do this test, pee (urinate) in the toilet and flush. Make a note of the time. This will be your start time on the day of collection and the end time on the next morning.  From then on, save all your pee (urine) in the plastic jug that was given to you.  You should stop collecting your pee 24 hours after you started.  If the plastic jug that is given to you  already has liquid in it, that is okay. Do not throw out the liquid or rinse out the jug. Some tests need the liquid to be added to your pee.  Keep your plastic jug cool (in an ice chest or the refrigerator) during the test.  When the 24 hours is over, bring your plastic jug to the clinic lab. Keep the jug cool (in an ice chest) while you are bringing it to the lab. Document Released: 05/29/2008 Document Revised: 05/25/2011 Document Reviewed: 05/29/2008 [facility] Patient Information 2015 [place], [place]. This information is not intended to replace advice given to you by your health care provider. Make sure you discuss any questions you have with your health care provider.

## 2014-07-31 NOTE — Progress Notes (Signed)
[place] at [place] NOTE  Patient Care Team: [name], MD as PCP - General [address] / [place] [address]  CHIEF COMPLAINTS/PURPOSE OF CONSULTATION:  Elevated kappa/lambda light chain ratio DVT CAD/pacemaker defibrillator placement  HISTORY OF PRESENTING ILLNESS:  Alan Orr 69 y.o. male is here because of elevated kappa lambda light chain ratio.  He follows with Dr. Lowanda Foster for his kidney disease. He has long standing diabetes. Labs from [facility] on 03/27/2014 show no evidence of an M spike, kappa/lambda ratio of 2.02, normal immunoglobulin levels.  He has a mild anemia with  hgb of 11.9. Urine studies show no evidence of monoclonal protein.   His appetite varies. "Sometimes he eats like a horse and other times he nibbles". He reports losing 75-80 lbs due to teeth extraction several years ago and has not gained it back.   MEDICAL HISTORY:  Past Medical History  Diagnosis Date  . Ischemic cardiomyopathy   . CAD (coronary artery disease)   . S/P CABG x 5   . PAF (paroxysmal atrial fibrillation)   . Hypertension   . Hyperlipidemia   . RBBB   . ICD (implantable cardioverter-defibrillator), single, in situ   . DM (diabetes mellitus)     type 2   . DVT (deep venous thrombosis)     SURGICAL HISTORY: Past Surgical History  Procedure Laterality Date  . Coronary artery bypass graft  12/31/1995    LIMA to LAD,SVG to intermediate,SVG to CX,seq. svg to posterior descending and posterolateral RCA  . Cardiac catheterization  05/08/2009    small vessel disease  . Cardiac defibrillator placement  05/09/2009    [employer]  . Back surgery    . I&d extremity Left 11/03/2013    Procedure: Left Leg Debride Ulcer, Apply Wound VAC and Theraskin;  Surgeon: Newt Minion, MD;  Location: [place];  Service: Orthopedics;  Laterality: Left;    SOCIAL HISTORY: History   Social History  . Marital Status: Married    Spouse Name: N/A  . Number of  Children: N/A  . Years of Education: N/A   Occupational History  . Not on file.   Social History Main Topics  . Smoking status: Former Smoker    Types: Cigarettes    Quit date: 11/03/1995  . Smokeless tobacco: Never Used  . Alcohol Use: No  . Drug Use: No  . Sexual Activity: Not on file   Other Topics Concern  . Not on file   Social History Narrative  Married 8 years, together for 14 years. He has 4 step children. He is a former smoker. He quit in 1997. Smoked for 40 years. No problems with alcohol. He worked in [employer] for 40 years.  FAMILY HISTORY: Family History  Problem Relation Age of Onset  . Heart attack Mother   . Stroke Father    indicated that his mother is deceased. He indicated that his father is deceased.    Mother died of MI at 31 y/o Father had a stroke around 53 y/o Had 7 siblings originally. Middle brother died in 1 with leukemia. 4 living sisters. 1 sister died of grief after the passing of her son.  ALLERGIES:  has No Known Allergies.  MEDICATIONS:  Current Outpatient Prescriptions  Medication Sig Dispense Refill  . acetaminophen (TYLENOL) 500 MG tablet Take 500 mg by mouth daily.    [place] Kitchen ALPRAZolam (XANAX) 1 MG tablet Take 1 mg by mouth at bedtime as needed for  sleep.    . amiodarone (PACERONE) 200 MG tablet Take 200 mg by mouth daily.    Marland Kitchen aspirin EC 81 MG tablet Take 81 mg by mouth at bedtime.    Marland Kitchen atorvastatin (LIPITOR) 40 MG tablet Take 40 mg by mouth daily.    . digoxin (LANOXIN) 0.125 MG tablet Take 0.125 mg by mouth daily.     Marland Kitchen levothyroxine (SYNTHROID, LEVOTHROID) 112 MCG tablet Take 112 mcg by mouth daily before breakfast.    . magnesium oxide (MAG-OX) 400 MG tablet Take 400 mg by mouth daily.    . metoprolol (LOPRESSOR) 50 MG tablet TAKE 1 TABLET BY MOUTH TWICE DAILY. 180 tablet 0  . niacin (NIASPAN) 1000 MG CR tablet TAKE (1) TABLET BY MOUTH AT BEDTIME. 30 tablet 0  . oxyCODONE-acetaminophen (PERCOCET) 10-325 MG per tablet Take 1  tablet by mouth every 4 (four) hours as needed for pain.    . pioglitazone (ACTOS) 45 MG tablet Take 45 mg by mouth daily.    Marland Kitchen PRADAXA 75 MG CAPS capsule TAKE 1 CAPSULE BY MOUTH EVERY 12 HOURS. 60 capsule 6  . ramipril (ALTACE) 2.5 MG capsule Take 2.5 mg by mouth at bedtime.     Mar[place] Kitchen ZETIA 10 MG tablet TAKE 1 TABLET ONCE DAILY FOR CHOLESTEROL. 30 tablet 0  . zolpidem (AMBIEN) 10 MG tablet Take 10 mg by mouth at bedtime as needed for sleep.    Marland Kitchen doxycycline (VIBRAMYCIN) 100 MG capsule Take 100 mg by mouth 2 (two) times daily. 10 day course filled 10/27/13    . metFORMIN (GLUCOPHAGE) 500 MG tablet Take 500 mg by mouth 2 (two) times daily with a meal.    . nitroGLYCERIN (NITROSTAT) 0.4 MG SL tablet Place 0.4 mg under the tongue every 5 (five) minutes as needed for chest pain.     No current facility-administered medications for this visit.    Review of Systems  Constitutional: Negative for fever, chills, weight loss and malaise/fatigue.  HENT: Negative for congestion, hearing loss, nosebleeds, sore throat and tinnitus.   Eyes: Negative for blurred vision, double vision, pain and discharge.       Wears glasses  Respiratory: Negative for cough, hemoptysis, sputum production, shortness of breath and wheezing.   Cardiovascular: Negative for chest pain, palpitations, claudication, leg swelling and PND.  Gastrointestinal: Positive for constipation. Negative for heartburn, nausea, vomiting, abdominal pain, diarrhea, blood in stool and melena.  Genitourinary: Negative for dysuria, urgency, frequency and hematuria.  Musculoskeletal: Positive for back pain. Negative for myalgias, joint pain and falls.  Skin: Negative for itching and rash.  Neurological: Negative for dizziness, tingling, tremors, sensory change, speech change, focal weakness, seizures, loss of consciousness, weakness and headaches.  Endo/Heme/Allergies: Positive for environmental allergies. Does not bruise/bleed easily.    Psychiatric/Behavioral: Negative for depression, suicidal ideas, memory loss and substance abuse. The patient is not nervous/anxious and does not have insomnia.    14 point ROS was done and is otherwise as detailed above or in HPI   PHYSICAL EXAMINATION: ECOG PERFORMANCE STATUS: 1 - Symptomatic but completely ambulatory  Filed Vitals:   07/31/14 1343  BP: 103/58  Pulse: 79  Temp: 97.5 F (36.4 C)  Resp: 22   Filed Weights   07/31/14 1343  Weight: 167 lb 1.6 oz (75.796 kg)     Physical Exam  Constitutional: He is oriented to person, place, and time and well-developed, well-nourished, and in no distress.  HENT:  Head: Normocephalic and atraumatic.  Nose: Nose normal.  Mouth/Throat: Oropharynx is clear and moist. No oropharyngeal exudate.  Eyes: Conjunctivae and EOM are normal. Pupils are equal, round, and reactive to light. Right eye exhibits no discharge. Left eye exhibits no discharge. No scleral icterus.  Neck: Normal range of motion. Neck supple. No tracheal deviation present. No thyromegaly present.  Cardiovascular: Normal rate, regular rhythm and normal heart sounds.  Exam reveals no gallop and no friction rub.   No murmur heard. Pulmonary/Chest: Effort normal and breath sounds normal. He has no wheezes. He has no rales.  Pacemaker/defibrillator in left chest  Abdominal: Soft. Bowel sounds are normal. He exhibits no distension and no mass. There is no tenderness. There is no rebound and no guarding.  Musculoskeletal: Normal range of motion. He exhibits edema.  Bilateral lower extremity edema   Lymphadenopathy:    He has no cervical adenopathy.  Neurological: He is alert and oriented to person, place, and time. He has normal reflexes. No cranial nerve deficit. Gait normal. Coordination normal.  Skin: Skin is warm and dry. No rash noted.  Psychiatric: Mood, memory, affect and judgment normal.  Nursing note and vitals reviewed.   LABORATORY DATA:  I have reviewed the  data as listed Lab Results  Component Value Date   WBC 5.5 11/03/2013   HGB 10.8* 11/03/2013   HCT 32.8* 11/03/2013   MCV 102.2* 11/03/2013   PLT 216 11/03/2013    RADIOGRAPHIC STUDIES: I have personally reviewed the radiological images as listed and agreed with the findings in the report.  CLINICAL DATA: Chronic renal disease.  EXAM: RENAL/URINARY TRACT ULTRASOUND COMPLETE  COMPARISON: CT 09/ 29/2015  FINDINGS: Right Kidney:  Length: 11.2 cm. Echogenicity within normal limits. No mass or hydronephrosis visualized.  Left Kidney:  Length: 10.6 cm. Echogenicity within normal limits. No mass or hydronephrosis visualized. Tiny nonobstructing left renal calyceal stone cannot be excluded.  Bladder:  Appears normal for degree of bladder distention.  IMPRESSION: Tiny nonobstructing left renal calyceal stone cannot be excluded. Exam is otherwise unremarkable.   Electronically Signed  By: Marcello Moores Register  On: 03/22/2014 09:41       ASSESSMENT & PLAN:  Stage 3 CKD Elevated kappa/lambda light chain ratio DVT CAD/pacemaker defibrillator placement  I have recommended that the patient take home a 24-hour urine for light chains. I have ordered additional blood work today. I discussed with him the reason for his referral and advised him that at some point we may consider doing a bone marrow biopsy. If there is a concern for light chain deposition disease or amyloidosis biopsy should be sought. Typically this is recommended with an abdominal fat pad aspirate and bone marrow biopsy. I ordered a myeloma survey today as well.  I will bring him back within 2 weeks to review the results and make additional recommendations at that time.  All questions were answered. The patient knows to call the clinic with any problems, questions or concerns.  This note was electronically signed.    This document serves as a record of services personally performed by Ancil Linsey, MD. It was created on her behalf by Pearlie Oyster, a trained medical scribe. The creation of this record is based on the scribe's personal observations and the provider's statements to them. This document has been checked and approved by the attending provider.    I have reviewed the above documentation for accuracy and completeness, and I agree with the above.  Kelby Fam. [place] MD

## 2014-07-31 NOTE — Progress Notes (Signed)
Alan Orr presented for labwork. Labs per MD order drawn via Peripheral Line 25 gauge needle inserted in lt ac  Good blood return present. Procedure without incident.  Needle removed intact. Patient tolerated procedure well.

## 2014-08-01 LAB — KAPPA/LAMBDA LIGHT CHAINS
KAPPA, LAMDA LIGHT CHAIN RATIO: 1.9 — AB (ref 0.26–1.65)
Kappa free light chain: 63.88 mg/L — ABNORMAL HIGH (ref 3.30–19.40)
Lambda free light chains: 33.56 mg/L — ABNORMAL HIGH (ref 5.71–26.30)

## 2014-08-01 LAB — PROTEIN ELECTROPHORESIS, SERUM
A/G Ratio: 1 (ref 0.7–2.0)
ALBUMIN ELP: 2.9 g/dL — AB (ref 3.2–5.6)
ALPHA-1-GLOBULIN: 0.3 g/dL (ref 0.1–0.4)
ALPHA-2-GLOBULIN: 0.7 g/dL (ref 0.4–1.2)
BETA GLOBULIN: 1 g/dL (ref 0.6–1.3)
GAMMA GLOBULIN: 0.9 g/dL (ref 0.5–1.6)
Globulin, Total: 2.9 g/dL (ref 2.0–4.5)
Total Protein ELP: 5.8 g/dL — ABNORMAL LOW (ref 6.0–8.5)

## 2014-08-01 LAB — IGG, IGA, IGM
IGM, SERUM: 76 mg/dL (ref 20–172)
IgA: 338 mg/dL (ref 61–437)
IgG (Immunoglobin G), Serum: 939 mg/dL (ref 700–1600)

## 2014-08-01 LAB — IMMUNOFIXATION ELECTROPHORESIS
IgA: 320 mg/dL (ref 61–437)
IgG (Immunoglobin G), Serum: 910 mg/dL (ref 700–1600)
IgM, Serum: 73 mg/dL (ref 20–172)
Total Protein ELP: 5.8 g/dL — ABNORMAL LOW (ref 6.0–8.5)

## 2014-08-02 ENCOUNTER — Telehealth (HOSPITAL_COMMUNITY): Payer: Self-pay

## 2014-08-02 NOTE — Telephone Encounter (Signed)
Spoke with wife and instructions given.  Plans to start collection on 5/26 and bring in urine on 5/27.

## 2014-08-02 NOTE — Telephone Encounter (Signed)
-----   Message from Epifanio Lesches sent at 08/02/2014 11:21 AM EDT ----- Pt wants to know when to start collecting his urine

## 2014-08-09 DIAGNOSIS — D472 Monoclonal gammopathy: Secondary | ICD-10-CM | POA: Diagnosis not present

## 2014-08-10 ENCOUNTER — Encounter (HOSPITAL_COMMUNITY): Payer: Medicare Other

## 2014-08-10 ENCOUNTER — Ambulatory Visit (HOSPITAL_COMMUNITY)
Admission: RE | Admit: 2014-08-10 | Discharge: 2014-08-10 | Disposition: A | Discharge status: Home / Self Care | Payer: Medicare Other | Source: Ambulatory Visit | Attending: Hematology & Oncology | Admitting: Hematology & Oncology

## 2014-08-10 DIAGNOSIS — M479 Spondylosis, unspecified: Secondary | ICD-10-CM | POA: Diagnosis not present

## 2014-08-10 DIAGNOSIS — D472 Monoclonal gammopathy: Secondary | ICD-10-CM

## 2014-08-10 NOTE — Progress Notes (Signed)
24 hr urine dropped off

## 2014-08-14 ENCOUNTER — Encounter: Payer: Medicare Other | Admitting: Cardiovascular Disease

## 2014-08-14 LAB — UIFE/LIGHT CHAINS/TP QN, 24-HR UR
% BETA, Urine: 20.2 %
ALPHA 1 URINE: 2.9 %
ALPHA 2 UR: 8 %
Albumin, U: 35.8 %
FREE KAPPA/LAMBDA RATIO: 13.99 — AB (ref 2.04–10.37)
FREE LT CHN EXCR RATE: 54.7 mg/L — AB (ref 1.35–24.19)
Free Lambda Lt Chains,Ur: 3.91 mg/L (ref 0.24–6.66)
GAMMA GLOBULIN URINE: 33.1 %
TOTAL PROTEIN, URINE-UR/DAY: 143.5 mg/(24.h) (ref 30.0–150.0)
Time: 24 hours
Total Protein, Urine: 8.2 mg/dL
VOLUME, URINE-UPE24: 1750 mL

## 2014-08-15 ENCOUNTER — Encounter: Payer: Self-pay | Admitting: Cardiovascular Disease

## 2014-08-15 ENCOUNTER — Ambulatory Visit (INDEPENDENT_AMBULATORY_CARE_PROVIDER_SITE_OTHER): Payer: Medicare Other | Admitting: Cardiovascular Disease

## 2014-08-15 VITALS — BP 128/62 | HR 72 | Ht 74.0 in | Wt 174.2 lb

## 2014-08-15 DIAGNOSIS — I2510 Atherosclerotic heart disease of native coronary artery without angina pectoris: Secondary | ICD-10-CM

## 2014-08-15 DIAGNOSIS — I5022 Chronic systolic (congestive) heart failure: Secondary | ICD-10-CM | POA: Diagnosis not present

## 2014-08-15 DIAGNOSIS — I480 Paroxysmal atrial fibrillation: Secondary | ICD-10-CM | POA: Diagnosis not present

## 2014-08-15 DIAGNOSIS — I2583 Coronary atherosclerosis due to lipid rich plaque: Secondary | ICD-10-CM

## 2014-08-15 DIAGNOSIS — E785 Hyperlipidemia, unspecified: Secondary | ICD-10-CM

## 2014-08-15 DIAGNOSIS — Z950 Presence of cardiac pacemaker: Secondary | ICD-10-CM | POA: Diagnosis not present

## 2014-08-15 NOTE — Assessment & Plan Note (Signed)
History of hyperlipidemia on atorvastatin and Niaspan followed by his PCP

## 2014-08-15 NOTE — Patient Instructions (Signed)
Dr Berry recommends that you schedule a follow-up appointment in 1 year. You will receive a reminder letter in the mail two months in advance. If you don't receive a letter, please call our office to schedule the follow-up appointment. 

## 2014-08-15 NOTE — Assessment & Plan Note (Signed)
History of PAF on Pradaxa oral anticoagulation maintaining sinus rhythm. He is also on amiodarone

## 2014-08-15 NOTE — Assessment & Plan Note (Signed)
History of IV ICD implantation by Dr. Crissie Sickles followed by Dr. Sallyanne Kuster.

## 2014-08-15 NOTE — Progress Notes (Signed)
08/15/2014 Alan Orr   1945-09-24  [id number]  Primary Physician Glo Herring., MD Primary Cardiologist: Lorretta Harp MD [name]   HPI:  The patient is a 69 year old mildly overweight married Caucasian male, father of 2, who I last saw in the office 03/03/13.Marland Kitchen He has a history of ischemic cardiomyopathy, status post coronary artery bypass grafting in 1997. He was catheterized by Dr. Rex Kras, May 08, 2009, revealing patent grafts with moderate LV dysfunction, unchanged anatomy. 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 His other problems include hypertension, hyperlipidemia, and type 2 diabetes. He has a BiV ICD, implanted by Dr. Cristopher Peru, which Dr. Sallyanne Kuster follows on a quarterly outpatient basis.he had a Myoview stress test performed 03/17/12 that showed scar in the anterior wall apex and septum with an EF of 42%. He is currently asymptomatic.  Current Outpatient Prescriptions  Medication Sig Dispense Refill  . ALPRAZolam (XANAX) 1 MG tablet Take 1 mg by mouth at bedtime as needed for sleep.    Marland Kitchen amiodarone (PACERONE) 200 MG tablet Take 200 mg by mouth daily.    Marland Kitchen aspirin EC 81 MG tablet Take 81 mg by mouth at bedtime.    Marland Kitchen atorvastatin (LIPITOR) 40 MG tablet Take 40 mg by mouth daily.    . digoxin (LANOXIN) 0.125 MG tablet Take 0.125 mg by mouth daily.     Marland Kitchen levothyroxine (SYNTHROID, LEVOTHROID) 112 MCG tablet Take 112 mcg by mouth daily before breakfast.    . magnesium oxide (MAG-OX) 400 MG tablet Take 400 mg by mouth daily.    . metoprolol (LOPRESSOR) 50 MG tablet TAKE 1 TABLET BY MOUTH TWICE DAILY. 180 tablet 0  . niacin (NIASPAN) 1000 MG CR tablet TAKE (1) TABLET BY MOUTH AT BEDTIME. 30 tablet 0  . nitroGLYCERIN (NITROSTAT) 0.4 MG SL tablet Place 0.4 mg under the tongue every 5 (five) minutes as needed for chest pain.    Marland Kitchen  oxyCODONE-acetaminophen (PERCOCET) 10-325 MG per tablet Take 1 tablet by mouth every 4 (four) hours as needed for pain.    . pioglitazone (ACTOS) 45 MG tablet Take 45 mg by mouth daily.    Marland Kitchen PRADAXA 75 MG CAPS capsule TAKE 1 CAPSULE BY MOUTH EVERY 12 HOURS. 60 capsule 6  . ramipril (ALTACE) 2.5 MG capsule Take 2.5 mg by mouth at bedtime.     Marland Kitchen ZETIA 10 MG tablet TAKE 1 TABLET ONCE DAILY FOR CHOLESTEROL. 30 tablet 0  . zolpidem (AMBIEN) 10 MG tablet Take 10 mg by mouth at bedtime as needed for sleep.     No current facility-administered medications for this visit.    No Known Allergies  History   Social History  . Marital Status: Married    Spouse Name: N/A  . Number of Children: N/A  . Years of Education: N/A   Occupational History  . Not on file.   Social History Main Topics  . Smoking status: Former Smoker    Types: Cigarettes    Quit date: 11/03/1995  . Smokeless tobacco: Never Used  . Alcohol Use: No  . Drug Use: No  . Sexual Activity: Not on file   Other Topics Concern  . Not on file   Social History Narrative     Review of Systems: General: negative for chills, fever, night sweats or weight changes.  Cardiovascular: negative for chest pain, dyspnea on exertion, edema, orthopnea, palpitations,  paroxysmal nocturnal dyspnea or shortness of breath Dermatological: negative for rash Respiratory: negative for cough or wheezing Urologic: negative for hematuria Abdominal: negative for nausea, vomiting, diarrhea, bright red blood per rectum, melena, or hematemesis Neurologic: negative for visual changes, syncope, or dizziness All other systems reviewed and are otherwise negative except as noted above.    Blood pressure 128/62, pulse 72, height 6\' 2"  (1.88 m), weight 174 lb 3.2 oz (79.017 kg).  General appearance: alert and no distress Neck: no adenopathy, no carotid bruit, no JVD, supple, symmetrical, trachea midline and thyroid not enlarged, symmetric, no  tenderness/mass/nodules Lungs: clear to auscultation bilaterally Heart: regular rate and rhythm, S1, S2 normal, no murmur, click, rub or gallop Extremities: extremities normal, atraumatic, no cyanosis or edema  EKG AV sequentially pacing.. I personally reviewed this EKG  ASSESSMENT AND PLAN:   Paroxysmal atrial fibrillation History of PAF on Pradaxa oral anticoagulation maintaining sinus rhythm. He is also on amiodarone   PACEMAKER, PERMANENT History of IV ICD implantation by Dr. Crissie Sickles followed by Dr. Sallyanne Kuster.   Hyperlipidemia History of hyperlipidemia on atorvastatin and Niaspan followed by his PCP   Chronic systolic heart failure History of systolic heart failure with an EF last measured at 42%. He denies chest pain or shortness of breath   CAD s/p CABG  History of CAD status post coronary artery bypass grafting 1997. He underwent cardiac catheterization by Dr. Rex Kras 05/08/09 revealing patent grafts with moderate LV dysfunction. He has remained stable since denying chest pain or shortness of breath. His last Myoview performed 03/17/12 showed anterior apical and septal scar without ischemia.       Lorretta Harp MD FACP,FACC,FAHA, [facility] 08/15/2014 2:41 PM

## 2014-08-15 NOTE — Assessment & Plan Note (Signed)
History of CAD status post coronary artery bypass grafting 1997. He underwent cardiac catheterization by Dr. Rex Kras 05/08/09 revealing patent grafts with moderate LV dysfunction. He has remained stable since denying chest pain or shortness of breath. His last Myoview performed 03/17/12 showed anterior apical and septal scar without ischemia.

## 2014-08-15 NOTE — Assessment & Plan Note (Signed)
History of systolic heart failure with an EF last measured at 42%. He denies chest pain or shortness of breath

## 2014-08-20 ENCOUNTER — Encounter: Payer: Self-pay | Admitting: Nephrology

## 2014-08-21 ENCOUNTER — Encounter (HOSPITAL_COMMUNITY): Payer: Self-pay | Admitting: Oncology

## 2014-08-21 ENCOUNTER — Encounter (HOSPITAL_COMMUNITY): Payer: Medicare Other | Attending: Hematology & Oncology | Admitting: Oncology

## 2014-08-21 VITALS — BP 135/41 | HR 80 | Temp 97.5°F | Resp 18 | Wt 175.0 lb

## 2014-08-21 DIAGNOSIS — D472 Monoclonal gammopathy: Secondary | ICD-10-CM | POA: Insufficient documentation

## 2014-08-21 DIAGNOSIS — R799 Abnormal finding of blood chemistry, unspecified: Secondary | ICD-10-CM

## 2014-08-21 DIAGNOSIS — R778 Other specified abnormalities of plasma proteins: Secondary | ICD-10-CM

## 2014-08-21 NOTE — Patient Instructions (Signed)
[place] at [facility] Discharge Instructions  RECOMMENDATIONS MADE BY THE CONSULTANT AND ANY TEST RESULTS WILL BE SENT TO YOUR REFERRING PHYSICIAN.  Bone marrow aspiration and biospy (at [facility] or [facility]). Return in 2-3 weeks for follow-up office visit.   Thank you for choosing [place] at [facility] to provide your oncology and hematology care.  To afford each patient quality time with our provider, please arrive at least 15 minutes before your scheduled appointment time.    You need to re-schedule your appointment should you arrive 10 or more minutes late.  We strive to give you quality time with our providers, and arriving late affects you and other patients whose appointments are after yours.  Also, if you no show three or more times for appointments you may be dismissed from the clinic at the providers discretion.     Again, thank you for choosing [facility].  Our hope is that these requests will decrease the amount of time that you wait before being seen by our physicians.       _____________________________________________________________  Should you have questions after your visit to [facility], please contact our office at [phone] between the hours of 8:30 a.m. and 4:30 p.m.  Voicemails left after 4:30 p.m. will not be returned until the following business day.  For prescription refill requests, have your pharmacy contact our office.

## 2014-08-21 NOTE — Progress Notes (Signed)
Glo Herring., MD [place] [id number]  Abnormal serum protein electrophoresis - Plan: CT Biopsy  CURRENT THERAPY:Work-up  INTERVAL HISTORY: Geno Sydnor Comp 69 y.o. male returns for followup of elevated kappa/lambda light chains and ratio.  I personally reviewed and went over laboratory results with the patient.  The results are noted within this dictation.  Kappa light chain elevation noted in 24 hour urine collection.    Unfortunately, we do not have an answer at this time.  He will therefore need a Bmbx.  I reviewed the procedure in detail.  He wishes to have this procedure completed by IR in [place].  He prefers [facility] over [place].  I discussed the role of this test with the patient and his wife.   Past Medical History  Diagnosis Date  . Ischemic cardiomyopathy   . CAD (coronary artery disease)   . S/P CABG x 5   . PAF (paroxysmal atrial fibrillation)   . Hypertension   . Hyperlipidemia   . RBBB   . ICD (implantable cardioverter-defibrillator), single, in situ   . DM (diabetes mellitus)     type 2   . DVT (deep venous thrombosis)     has DM; Chronic systolic heart failure; PACEMAKER, PERMANENT; AICD (Medtronic Concerto CRT-D, implanted February 2011); Cardiomyopathy, ischemic; CAD s/p CABG ; Hyperlipidemia; Paroxysmal atrial fibrillation; Ventricular tachycardia; History of carotid artery disease; Leg wound, left; and Abnormal serum protein electrophoresis on his problem list.     has No Known Allergies.  Current Outpatient Prescriptions on File Prior to Visit  Medication Sig Dispense Refill  . ALPRAZolam (XANAX) 1 MG tablet Take 1 mg by mouth at bedtime as needed for sleep.    Marland Kitchen amiodarone (PACERONE) 200 MG tablet Take 200 mg by mouth daily.    Marland Kitchen aspirin EC 81 MG tablet Take 81 mg by mouth at bedtime.    Marland Kitchen atorvastatin (LIPITOR) 40 MG tablet Take 40 mg by mouth daily.    . digoxin (LANOXIN) 0.125 MG tablet Take 0.125 mg by mouth  daily.     Marland Kitchen levothyroxine (SYNTHROID, LEVOTHROID) 112 MCG tablet Take 112 mcg by mouth daily before breakfast.    . magnesium oxide (MAG-OX) 400 MG tablet Take 400 mg by mouth daily.    . metoprolol (LOPRESSOR) 50 MG tablet TAKE 1 TABLET BY MOUTH TWICE DAILY. 180 tablet 0  . niacin (NIASPAN) 1000 MG CR tablet TAKE (1) TABLET BY MOUTH AT BEDTIME. 30 tablet 0  . nitroGLYCERIN (NITROSTAT) 0.4 MG SL tablet Place 0.4 mg under the tongue every 5 (five) minutes as needed for chest pain.    Marland Kitchen oxyCODONE-acetaminophen (PERCOCET) 10-325 MG per tablet Take 1 tablet by mouth every 4 (four) hours as needed for pain.    . pioglitazone (ACTOS) 45 MG tablet Take 45 mg by mouth daily.    Marland Kitchen PRADAXA 75 MG CAPS capsule TAKE 1 CAPSULE BY MOUTH EVERY 12 HOURS. 60 capsule 6  . ramipril (ALTACE) 2.5 MG capsule Take 2.5 mg by mouth at bedtime.     Marland Kitchen ZETIA 10 MG tablet TAKE 1 TABLET ONCE DAILY FOR CHOLESTEROL. 30 tablet 0  . zolpidem (AMBIEN) 10 MG tablet Take 10 mg by mouth at bedtime as needed for sleep.     No current facility-administered medications on file prior to visit.    Past Surgical History  Procedure Laterality Date  . Coronary artery bypass graft  12/31/1995    LIMA to  LAD,SVG to intermediate,SVG to CX,seq. svg to posterior descending and posterolateral RCA  . Cardiac catheterization  05/08/2009    small vessel disease  . Cardiac defibrillator placement  05/09/2009    Medtronic  . Back surgery    . I&d extremity Left 11/03/2013    Procedure: Left Leg Debride Ulcer, Apply Wound VAC and Ther[place]in;  Surgeon: Newt Minion, MD;  Location: [facility];  Service: Orthopedics;  Laterality: Left;    Denies any headaches, dizziness, double vision, fevers, chills, night sweats, nausea, vomiting, diarrhea, constipation, chest pain, heart palpitations, shortness of breath, blood in stool, black tarry stool, urinary pain, urinary burning, urinary frequency, hematuria.   PHYSICAL EXAMINATION  ECOG PERFORMANCE STATUS:  0 - Asymptomatic  There were no vitals filed for this visit.  GENERAL:alert, no distress, well nourished, well developed, comfortable, cooperative, smiling and accompanied by wife. SKIN: skin color, texture, turgor are normal, no rashes or significant lesions HEAD: Normocephalic, No masses, lesions, tenderness or abnormalities EYES: normal, PERRLA, EOMI, Conjunctiva are pink and non-injected EARS: External ears normal OROPHARYNX:lips, buccal mucosa, and tongue normal and mucous membranes are moist  NECK: supple, no adenopathy, thyroid normal size, non-tender, without nodularity, no stridor, non-tender, trachea midline NEURO: alert & oriented x 3 with fluent speech, no focal motor/sensory deficits, gait normal   LABORATORY DATA: CBC    Component Value Date/Time   WBC 4.8 07/31/2014 1528   RBC 3.37* 07/31/2014 1528   HGB 11.1* 07/31/2014 1528   HCT 35.1* 07/31/2014 1528   PLT 281 07/31/2014 1528   MCV 104.2* 07/31/2014 1528   MCH 32.9 07/31/2014 1528   MCHC 31.6 07/31/2014 1528   RDW 14.7 07/31/2014 1528   LYMPHSABS 2.0 07/31/2014 1528   MONOABS 0.5 07/31/2014 1528   EOSABS 0.2 07/31/2014 1528   BASOSABS 0.1 07/31/2014 1528      Chemistry      Component Value Date/Time   NA 138 07/31/2014 1528   K 3.7 07/31/2014 1528   CL 101 07/31/2014 1528   CO2 29 07/31/2014 1528   BUN 12 07/31/2014 1528   CREATININE 1.33* 07/31/2014 1528      Component Value Date/Time   CALCIUM 8.7* 07/31/2014 1528   ALKPHOS 78 07/31/2014 1528   AST 40 07/31/2014 1528   ALT 27 07/31/2014 1528   BILITOT 0.6 07/31/2014 1528       RADIOGRAPHIC STUDIES:  Dg Bone Survey Met  08/10/2014   CLINICAL DATA:  Abnormal light chains ; the patient is asymptomatic  EXAM: METASTATIC BONE SURVEY  COMPARISON:  None.  FINDINGS: The chest x-ray reveals the lungs to be clear. The heart and mediastinal structures are unremarkable. There are post CABG changes. A permanent pacemaker defibrillator is present. The  observed portions of the ribs are unremarkable.  Skull: There is an approximately 4 mm diameter lucency in the posterior parietal bone. This is nonspecific.  Spine: The cervical, thoracic, and lumbar spines exhibit multilevel degenerative disc disease. There is mild loss of height of the body of L1 and minimal wedging of the body of T12. The patient has undergone posterior fusion from L2 through L5. The metallic hardware is intact.  Clavicles and upper extremities: No lytic or blastic lesions are demonstrated.  Pelvis and lower extremities: There are no lytic or blastic lesions. The hip joint spaces are reasonably well-maintained.  IMPRESSION: There is a 4 mm diameter lucency in the parietal bone of the calvarium. There is no definite lytic or blastic lesion elsewhere within the skeleton.  There are degenerative changes of the spine.   Electronically Signed   By: David  Martinique M.D.   On: 08/10/2014 12:13     ASSESSMENT AND PLAN:  Abnormal serum protein electrophoresis Given serum and urine testing, he will need a bone marrow aspiration and biopsy to confirm diagnosis.  He wishes to have the procedure completed in [place] at [facility].  Order placed with IR.  Please send for FISH, FLOW, and Cytogenetics.  Return in 2-3 weeks for follow-up.     THERAPY PLAN:  Bone marrow aspiration and biopsy  All questions were answered. The patient knows to call the clinic with any problems, questions or concerns. We can certainly see the patient much sooner if necessary.  Patient and plan discussed with Dr. Ancil Linsey and she is in agreement with the aforementioned.   This note is electronically signed by: Doy Mince 08/21/2014 3:05 PM

## 2014-08-21 NOTE — Assessment & Plan Note (Signed)
Given serum and urine testing, he will need a bone marrow aspiration and biopsy to confirm diagnosis.  He wishes to have the procedure completed in [place] at [facility].  Order placed with IR.  Please send for FISH, FLOW, and Cytogenetics.  Return in 2-3 weeks for follow-up.

## 2014-08-23 ENCOUNTER — Other Ambulatory Visit: Payer: Self-pay | Admitting: Cardiovascular Disease

## 2014-08-23 ENCOUNTER — Telehealth (HOSPITAL_COMMUNITY): Payer: Self-pay

## 2014-08-23 NOTE — Telephone Encounter (Signed)
Call from Angelina Ok stating that Alan Orr will need to stop anticoagulants 4 days before biopsy.  Biopsy is scheduled for 08/30/14. Patient will also need to know if any lovenox coverage needed and when to resume Pradaxa.

## 2014-08-23 NOTE — Telephone Encounter (Signed)
Rx(s) sent to pharmacy electronically.  

## 2014-08-23 NOTE — Telephone Encounter (Signed)
Usually this anticoagulant is only held 1 day prior to bone marrows, at least that is what we do in the clinic when we do them.  He is on anticoagulation from a cardiac standpoint.  I need to defer to Cariology because 4 days is a long time without anticoagulation.    KEFALAS,THOMAS 08/23/2014

## 2014-08-24 ENCOUNTER — Telehealth (HOSPITAL_COMMUNITY): Payer: Self-pay

## 2014-08-24 NOTE — Telephone Encounter (Signed)
[name] notified that Alan Orr should continue his pradaxa and not to hold it for his procedure.  Verbalized understanding of instructions.

## 2014-08-24 NOTE — Telephone Encounter (Signed)
-----  Message from Baird Cancer, PA-C sent at 08/24/2014  4:01 PM EDT ----- Regarding: RE: Ct bone marrow biopsy Thank you  ----- Message -----    From: Alecia Lemming    Sent: 08/24/2014   8:03 AM      To: Baird Cancer, PA-C Subject: RE: Ct bone marrow biopsy                      I was just told all patient can continue taking there blood thinners for bone marow biopsy, so theres no need for him to hold blood thinners. Thanks ----- Message -----    From: Baird Cancer, PA-C    Sent: 08/23/2014   5:12 PM      To: Alecia Lemming Subject: RE: Ct bone marrow biopsy                      That is an awfully long time to be off an anticoagulant that has such a short 1/2 life.  We typically hold it for 1 day prior to bone marrow bx in the clinic.  He is requesting bone marrow bx in [place] or else we would do it in the clinic.  He is on Pradaxa for a-fib and therefore, I am not comfortable holding his anticoagulant that long.  I will defer to cardiologist.  Robynn Pane, PA-C   ----- Message -----    From: Alecia Lemming    Sent: 08/23/2014  10:51 AM      To: Baird Cancer, PA-C Subject: Ct bone marrow biopsy                          Your patient Alan Orr biopsy is 08-30-14 he is on a blood thinner which is Pardaxa he needs to hold 4 days prior of biopsy, if someone from your office can call patient have him hold blood thinners.Marland Kitchen

## 2014-08-29 ENCOUNTER — Other Ambulatory Visit: Payer: Self-pay | Admitting: Radiology

## 2014-08-30 ENCOUNTER — Ambulatory Visit (HOSPITAL_COMMUNITY): Admission: RE | Admit: 2014-08-30 | Payer: Medicare Other | Source: Ambulatory Visit

## 2014-08-31 DIAGNOSIS — I10 Essential (primary) hypertension: Secondary | ICD-10-CM | POA: Diagnosis not present

## 2014-08-31 DIAGNOSIS — E119 Type 2 diabetes mellitus without complications: Secondary | ICD-10-CM | POA: Diagnosis not present

## 2014-08-31 DIAGNOSIS — Z6823 Body mass index (BMI) 23.0-23.9, adult: Secondary | ICD-10-CM | POA: Diagnosis not present

## 2014-08-31 DIAGNOSIS — R809 Proteinuria, unspecified: Secondary | ICD-10-CM | POA: Diagnosis not present

## 2014-08-31 DIAGNOSIS — E782 Mixed hyperlipidemia: Secondary | ICD-10-CM | POA: Diagnosis not present

## 2014-09-05 ENCOUNTER — Ambulatory Visit (HOSPITAL_COMMUNITY): Payer: Medicare Other | Admitting: Hematology & Oncology

## 2014-09-06 ENCOUNTER — Other Ambulatory Visit: Payer: Self-pay | Admitting: Radiology

## 2014-09-07 ENCOUNTER — Other Ambulatory Visit: Payer: Self-pay | Admitting: Radiology

## 2014-09-10 ENCOUNTER — Ambulatory Visit (HOSPITAL_COMMUNITY): Admission: RE | Admit: 2014-09-10 | Payer: Medicare Other | Source: Ambulatory Visit

## 2014-09-11 ENCOUNTER — Ambulatory Visit (INDEPENDENT_AMBULATORY_CARE_PROVIDER_SITE_OTHER): Payer: Medicare Other | Admitting: Cardiovascular Disease

## 2014-09-11 ENCOUNTER — Encounter: Payer: Self-pay | Admitting: Cardiovascular Disease

## 2014-09-11 VITALS — BP 117/72 | HR 80 | Ht 74.0 in | Wt 186.6 lb

## 2014-09-11 DIAGNOSIS — I2510 Atherosclerotic heart disease of native coronary artery without angina pectoris: Secondary | ICD-10-CM

## 2014-09-11 DIAGNOSIS — I480 Paroxysmal atrial fibrillation: Secondary | ICD-10-CM | POA: Diagnosis not present

## 2014-09-11 DIAGNOSIS — I472 Ventricular tachycardia, unspecified: Secondary | ICD-10-CM

## 2014-09-11 DIAGNOSIS — I5022 Chronic systolic (congestive) heart failure: Secondary | ICD-10-CM

## 2014-09-11 DIAGNOSIS — I255 Ischemic cardiomyopathy: Secondary | ICD-10-CM

## 2014-09-11 DIAGNOSIS — Z95810 Presence of automatic (implantable) cardiac defibrillator: Secondary | ICD-10-CM

## 2014-09-11 NOTE — Patient Instructions (Addendum)
Remote monitoring is used to monitor your ICD from home. This monitoring reduces the number of office visits required to check your device to one time per year. It allows [place] to keep an eye on the functioning of your device to ensure it is working properly. You are scheduled for a device check from home on 12/11/2014. You may send your transmission at any time that day. If you have a wireless device, the transmission will be sent automatically. After your physician reviews your transmission, you will receive a postcard with your next transmission date.  Your physician recommends that you schedule a follow-up appointment in: 12 months with Dr.Croitoru  Your physician recommends that you return for lab work when possible (TSH).

## 2014-09-11 NOTE — Progress Notes (Signed)
Patient ID: Alan Orr, male   DOB: Jul 20, 1945, 69 y.o.   MRN: [id number]      Cardiology Office Note   Date:  09/11/2014   ID:  Alan Orr, Alan Orr 03, 1947, MRN [id number]  PCP:  Glo Herring., MD  Cardiologist:  Quay Burow, MD;  Sanda Klein, MD   Chief Complaint  Patient presents with  . Annual Exam    pacer check      History of Present Illness: Alan Orr is a 69 y.o. male who presents for annual follow-up on his CRT-D device. He saw Dr. Quay Burow just a few weeks ago for follow-up on CAD and ischemic cardiomyopathy, previously complicated by both paroxysmal atrial fibrillation and ventricular tachycardia storm.  Interrogation of his dual-chamber biventricular defibrillator (Medtronic Concerto II) shows normal device function with 99.9% biventricular pacing and 84% atrial pacing. There have been no episodes of atrial fibrillation or any ventricular tachycardia. He has very low burden of PVCs. Lead parameters are all excellent.  Generator voltage 2.73 V (RRT 2.63 V). Optivol index is increased but this has not correlated well with heart failure exacerbation in the past.  He is on chronic treatment with amiodarone and last had liver function tests performed about 5 weeks ago. He has not had thyroid function tests recently performed. He is on anticoagulation with pradaxa and has not had any bleeding complications.he denies angina, lower extremity edema or exertional dyspnea.  He lost a lot of weight after having some dental problems, but is gaining back. 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  He has a long-standing history of moderate ischemic cardiomyopathy (ejection fraction around 35%), compensated congestive heart failure (NYHA functional class II), coronary artery disease (status post bypass surgery 1997 with patent LIMA to LAD, SVG to diagonal/ramus, SVG to OM and SVG to PDA by  cardiac catheterization 2011 - possible occlusion of the sequential limb to the PLA), history of paroxysmal atrial fibrillation, history of ventricular tachycardia storm, status post defibrillator/biventricular pacemaker (Medtronic Concerto dual-chamber device). Nuclear study 2014 shows LAD distribution scar, no ischemia, EF42%.  Past Medical History  Diagnosis Date  . Ischemic cardiomyopathy   . CAD (coronary artery disease)   . S/P CABG x 5   . PAF (paroxysmal atrial fibrillation)   . Hypertension   . Hyperlipidemia   . RBBB   . ICD (implantable cardioverter-defibrillator), single, in situ   . DM (diabetes mellitus)     type 2   . DVT (deep venous thrombosis)     Past Surgical History  Procedure Laterality Date  . Coronary artery bypass graft  12/31/1995    LIMA to LAD,SVG to intermediate,SVG to CX,seq. svg to posterior descending and posterolateral RCA  . Cardiac catheterization  05/08/2009    small vessel disease  . Cardiac defibrillator placement  05/09/2009    Medtronic  . Back surgery    . I&d extremity Left 11/03/2013    Procedure: Left Leg Debride Ulcer, Apply Wound VAC and Theraskin;  Surgeon: Newt Minion, MD;  Location: [facility];  Service: Orthopedics;  Laterality: Left;     Current Outpatient Prescriptions  Medication Sig Dispense Refill  . ALPRAZolam (XANAX) 1 MG tablet Take 1 mg by mouth at bedtime as needed for sleep.    Marland Kitchen amiodarone (PACERONE) 200 MG tablet Take 200 mg by mouth daily.    Marland Kitchen aspirin EC 81 MG tablet Take 81 mg by mouth at bedtime.    Marland Kitchen  atorvastatin (LIPITOR) 40 MG tablet Take 40 mg by mouth daily.    . digoxin (LANOXIN) 0.125 MG tablet Take 0.125 mg by mouth daily.     Marland Kitchen levothyroxine (SYNTHROID, LEVOTHROID) 112 MCG tablet Take 112 mcg by mouth daily before breakfast.    . magnesium oxide (MAG-OX) 400 MG tablet Take 400 mg by mouth daily.    . metoprolol (LOPRESSOR) 50 MG tablet TAKE 1 TABLET BY MOUTH TWICE DAILY. 180 tablet 0  . niacin (NIASPAN)  1000 MG CR tablet TAKE (1) TABLET BY MOUTH AT BEDTIME. 30 tablet 11  . nitroGLYCERIN (NITROSTAT) 0.4 MG SL tablet Place 0.4 mg under the tongue every 5 (five) minutes as needed for chest pain.    Marland Kitchen oxyCODONE-acetaminophen (PERCOCET) 10-325 MG per tablet Take 1 tablet by mouth every 4 (four) hours as needed for pain.    . pioglitazone (ACTOS) 45 MG tablet Take 45 mg by mouth daily.    Marland Kitchen PRADAXA 75 MG CAPS capsule TAKE 1 CAPSULE BY MOUTH EVERY 12 HOURS. 60 capsule 6  . ramipril (ALTACE) 2.5 MG capsule Take 2.5 mg by mouth at bedtime.     Marland Kitchen ZETIA 10 MG tablet TAKE 1 TABLET ONCE DAILY FOR CHOLESTEROL. 30 tablet 11  . zolpidem (AMBIEN) 10 MG tablet Take 10 mg by mouth at bedtime as needed for sleep.    . fluocinonide cream (LIDEX) 0.09 % Apply 1 application topically daily.    . mupirocin ointment (BACTROBAN) 2 % Apply 1 application topically daily.     No current facility-administered medications for this visit.    Allergies:   Review of patient's allergies indicates no known allergies.    Social History:  The patient  reports that he quit smoking about 18 years ago. His smoking use included Cigarettes. He has never used smokeless tobacco. He reports that he does not drink alcohol or use illicit drugs.   Family History:  The patient's family history includes Heart attack in his mother; Stroke in his father.    ROS:  Please see the history of present illness.    Otherwise, review of systems positive for weight loss.   All other systems are reviewed and negative.    PHYSICAL EXAM: VS:  BP 117/72 mmHg  Pulse 80  Ht 6' 2"  (1.88 m)  Wt 186 lb 9.6 oz (84.641 kg)  BMI 23.95 kg/m2 , BMI Body mass index is 23.95 kg/(m^2).  General: Alert, oriented x3, no distress  Head: no evidence of trauma, PERRL, EOMI, no exophtalmos or lid lag, no myxedema, no xanthelasma; normal ears, nose and oropharynx  Neck: normal jugular venous pulsations and no hepatojugular reflux; brisk carotid pulses without  delay, right carotid bruit  Chest: clear to auscultation, no signs of consolidation by percussion or palpation, normal fremitus, symmetrical and full respiratory excursions; healthy defibrillator site  Cardiovascular: normal position and quality of the apical impulse, regular rhythm, normal first and second heart sounds, no murmurs, rubs or gallops  Abdomen: no tenderness or distention, no masses by palpation, no abnormal pulsatility or arterial bruits, normal bowel sounds, no hepatosplenomegaly  Extremities: no clubbing, cyanosis or edema; 2+ radial, ulnar and brachial pulses bilaterally; 2+ right femoral, posterior tibial and dorsalis pedis pulses; 2+ left femoral, posterior tibial and dorsalis pedis pulses; no subclavian or femoral bruits  Neurological: grossly nonfocal Psych: euthymic mood, full affect   EKG:  EKG is not ordered today.   Recent Labs: 07/31/2014: ALT 27; BUN 12; Creatinine, Ser 1.33*; Hemoglobin 11.1*; Platelets  281; Potassium 3.7; Sodium 138    Lipid Panel    Component Value Date/Time   CHOL  05/07/2009 0826    128        ATP III CLASSIFICATION:  <200     mg/dL   Desirable  200-239  mg/dL   Borderline High  >=240    mg/dL   High          TRIG 46 05/07/2009 0826   HDL 43 05/07/2009 0826   CHOLHDL 3.0 05/07/2009 0826   VLDL 9 05/07/2009 0826   LDLCALC  05/07/2009 0826    76        Total Cholesterol/HDL:CHD Risk Coronary Heart Disease Risk Table                     Men   Women  1/2 Average Risk   3.4   3.3  Average Risk       5.0   4.4  2 X Average Risk   9.6   7.1  3 X Average Risk  23.4   11.0        Use the calculated Patient Ratio above and the CHD Risk Table to determine the patient's CHD Risk.        ATP III CLASSIFICATION (LDL):  <100     mg/dL   Optimal  100-129  mg/dL   Near or Above                    Optimal  130-159  mg/dL   Borderline  160-189  mg/dL   High  >190     mg/dL   Very High      Wt Readings from Last 3 Encounters:    09/11/14 186 lb 9.6 oz (84.641 kg)  08/21/14 175 lb (79.379 kg)  08/15/14 174 lb 3.2 oz (79.017 kg)      Other studies Reviewed: Additional studies/ records that were reviewed today include: Dr. Gwenlyn Found note 06/01 ASSESSMENT AND PLAN:  AICD (Medtronic Concerto CRT-D, implanted February 2011) Normal device function with 100% biventricular pacing. No permanent changes made to the device settings. He is enrolled in remote monitoring.  CAD s/p CABG  Angina free  Chronic systolic heart failure NYHA functional class II. He appears clinically euvolemic. Optivol does not appear to be useful in his case. No changes made to his medications.  Paroxysmal atrial fibrillation No episodes of AF since his last device check. He is on appropriate anticoagulation without bleeding complication. Monitor liver function tests, thyroid function tests, eye exam and lung function periodically while he takes amiodarone. Monitor renal function carefully and adjust digoxin dose appropriately, keeping in mind interaction with amiodarone. Needs TSH update.    Current medicines are reviewed at length with the patient today.  The patient does not have concerns regarding medicines.  The following changes have been made:  no change  Labs/ tests ordered today include:  Orders Placed This Encounter  Procedures  . TSH    Patient Instructions  Remote monitoring is used to monitor your ICD from home. This monitoring reduces the number of office visits required to check your device to one time per year. It allows [place] to keep an eye on the functioning of your device to ensure it is working properly. You are scheduled for a device check from home on 12/11/2014. You may send your transmission at any time that day. If you have a wireless device, the transmission will be sent automatically. After  your physician reviews your transmission, you will receive a postcard with your next transmission date.  Your physician recommends  that you schedule a follow-up appointment in: 12 months with Dr.Anette Barra  Your physician recommends that you return for lab work when possible (TSH).    Mikael Spray, MD  09/11/2014 12:36 PM    Sanda Klein, MD, [facility] [phone] office [phone] pager

## 2014-09-13 LAB — CUP PACEART INCLINIC DEVICE CHECK
Brady Statistic AP VP Percent: 83.74 %
Brady Statistic AP VS Percent: 0.03 %
Brady Statistic AS VS Percent: 0.01 %
Brady Statistic RA Percent Paced: 83.77 %
Date Time Interrogation Session: 20160628155051
HIGH POWER IMPEDANCE MEASURED VALUE: 36 Ohm
HighPow Impedance: 42 Ohm
Lead Channel Impedance Value: 418 Ohm
Lead Channel Impedance Value: 437 Ohm
Lead Channel Impedance Value: 437 Ohm
Lead Channel Impedance Value: 703 Ohm
Lead Channel Pacing Threshold Pulse Width: 0.4 ms
Lead Channel Sensing Intrinsic Amplitude: 0.625 mV
Lead Channel Sensing Intrinsic Amplitude: 0.625 mV
Lead Channel Sensing Intrinsic Amplitude: 9.125 mV
Lead Channel Setting Pacing Amplitude: 2.5 V
Lead Channel Setting Pacing Pulse Width: 0.4 ms
Lead Channel Setting Pacing Pulse Width: 0.4 ms
MDC IDC MSMT BATTERY VOLTAGE: 2.73 V
MDC IDC MSMT LEADCHNL LV PACING THRESHOLD AMPLITUDE: 0.875 V
MDC IDC MSMT LEADCHNL RA IMPEDANCE VALUE: 418 Ohm
MDC IDC MSMT LEADCHNL RV SENSING INTR AMPL: 9.125 mV
MDC IDC SET LEADCHNL LV PACING AMPLITUDE: 2 V
MDC IDC SET LEADCHNL RV PACING AMPLITUDE: 2.5 V
MDC IDC SET LEADCHNL RV SENSING SENSITIVITY: 0.3 mV
MDC IDC SET ZONE DETECTION INTERVAL: 250 ms
MDC IDC SET ZONE DETECTION INTERVAL: 370 ms
MDC IDC SET ZONE DETECTION INTERVAL: 450 ms
MDC IDC STAT BRADY AS VP PERCENT: 16.23 %
MDC IDC STAT BRADY RV PERCENT PACED: 99.97 %
Zone Setting Detection Interval: 290 ms
Zone Setting Detection Interval: 350 ms

## 2014-09-19 ENCOUNTER — Ambulatory Visit (HOSPITAL_COMMUNITY): Payer: Medicare Other | Admitting: Hematology & Oncology

## 2014-09-30 NOTE — Progress Notes (Signed)
This encounter was created in error - please disregard.

## 2014-10-05 ENCOUNTER — Encounter: Payer: Self-pay | Admitting: Cardiovascular Disease

## 2014-10-08 ENCOUNTER — Other Ambulatory Visit: Payer: Self-pay | Admitting: Radiology

## 2014-10-09 ENCOUNTER — Ambulatory Visit (HOSPITAL_COMMUNITY): Admission: RE | Admit: 2014-10-09 | Payer: Medicare Other | Source: Ambulatory Visit

## 2014-10-23 ENCOUNTER — Other Ambulatory Visit: Payer: Self-pay | Admitting: Cardiovascular Disease

## 2014-10-23 NOTE — Telephone Encounter (Signed)
REFILL 

## 2014-10-30 ENCOUNTER — Ambulatory Visit (HOSPITAL_COMMUNITY): Payer: Medicare Other | Admitting: Hematology & Oncology

## 2014-11-06 ENCOUNTER — Emergency Department (HOSPITAL_COMMUNITY): Payer: Medicare Other

## 2014-11-06 ENCOUNTER — Encounter (HOSPITAL_COMMUNITY): Payer: Self-pay | Admitting: Emergency Medicine

## 2014-11-06 ENCOUNTER — Emergency Department (HOSPITAL_COMMUNITY)
Admission: EM | Admit: 2014-11-06 | Discharge: 2014-11-06 | Disposition: A | Discharge status: Home / Self Care | Payer: Medicare Other | Attending: Emergency Medicine | Admitting: Emergency Medicine

## 2014-11-06 DIAGNOSIS — E119 Type 2 diabetes mellitus without complications: Secondary | ICD-10-CM | POA: Diagnosis not present

## 2014-11-06 DIAGNOSIS — I10 Essential (primary) hypertension: Secondary | ICD-10-CM | POA: Insufficient documentation

## 2014-11-06 DIAGNOSIS — Z79899 Other long term (current) drug therapy: Secondary | ICD-10-CM | POA: Insufficient documentation

## 2014-11-06 DIAGNOSIS — E785 Hyperlipidemia, unspecified: Secondary | ICD-10-CM | POA: Insufficient documentation

## 2014-11-06 DIAGNOSIS — Y998 Other external cause status: Secondary | ICD-10-CM | POA: Insufficient documentation

## 2014-11-06 DIAGNOSIS — Y9289 Other specified places as the place of occurrence of the external cause: Secondary | ICD-10-CM | POA: Insufficient documentation

## 2014-11-06 DIAGNOSIS — I2510 Atherosclerotic heart disease of native coronary artery without angina pectoris: Secondary | ICD-10-CM | POA: Diagnosis not present

## 2014-11-06 DIAGNOSIS — Z87891 Personal history of nicotine dependence: Secondary | ICD-10-CM | POA: Diagnosis not present

## 2014-11-06 DIAGNOSIS — Z9889 Other specified postprocedural states: Secondary | ICD-10-CM | POA: Diagnosis not present

## 2014-11-06 DIAGNOSIS — S0101XA Laceration without foreign body of scalp, initial encounter: Secondary | ICD-10-CM | POA: Diagnosis not present

## 2014-11-06 DIAGNOSIS — Z951 Presence of aortocoronary bypass graft: Secondary | ICD-10-CM | POA: Diagnosis not present

## 2014-11-06 DIAGNOSIS — Z86718 Personal history of other venous thrombosis and embolism: Secondary | ICD-10-CM | POA: Diagnosis not present

## 2014-11-06 DIAGNOSIS — W01198A Fall on same level from slipping, tripping and stumbling with subsequent striking against other object, initial encounter: Secondary | ICD-10-CM | POA: Diagnosis not present

## 2014-11-06 DIAGNOSIS — S0990XA Unspecified injury of head, initial encounter: Secondary | ICD-10-CM | POA: Diagnosis present

## 2014-11-06 DIAGNOSIS — Y9389 Activity, other specified: Secondary | ICD-10-CM | POA: Insufficient documentation

## 2014-11-06 DIAGNOSIS — Z7952 Long term (current) use of systemic steroids: Secondary | ICD-10-CM | POA: Diagnosis not present

## 2014-11-06 DIAGNOSIS — S098XXA Other specified injuries of head, initial encounter: Secondary | ICD-10-CM | POA: Diagnosis not present

## 2014-11-06 DIAGNOSIS — Z7901 Long term (current) use of anticoagulants: Secondary | ICD-10-CM | POA: Insufficient documentation

## 2014-11-06 DIAGNOSIS — Z7982 Long term (current) use of aspirin: Secondary | ICD-10-CM | POA: Insufficient documentation

## 2014-11-06 DIAGNOSIS — I480 Paroxysmal atrial fibrillation: Secondary | ICD-10-CM | POA: Insufficient documentation

## 2014-11-06 DIAGNOSIS — Z792 Long term (current) use of antibiotics: Secondary | ICD-10-CM | POA: Diagnosis not present

## 2014-11-06 MED ORDER — LIDOCAINE-EPINEPHRINE (PF) 2 %-1:200000 IJ SOLN
10.0000 mL | Freq: Once | INTRAMUSCULAR | Status: AC
  Administered 2014-11-06: 10 mL via INTRADERMAL

## 2014-11-06 MED ORDER — LIDOCAINE HCL (PF) 2 % IJ SOLN: 10.0000 mL | Freq: Once | INTRAMUSCULAR | Status: DC

## 2014-11-06 NOTE — ED Notes (Signed)
Pt made aware to return if symptoms worsen or if any life threatening symptoms occur.   

## 2014-11-06 NOTE — ED Provider Notes (Signed)
CSN: [id number]     Arrival date & time 11/06/14  1614 History   First MD Initiated Contact with Patient 11/06/14 1627     Chief Complaint  Patient presents with  . Fall  . Head Laceration      HPI  Patient presents for evaluation after a fall and head injury.  Patient states that his cat went between his legs and he stumbled and fell. He struck his head against the ground. He has no loss of consciousness. His behavior and interactions with his wife has been normal. Some bleeding from his scalp. No additional symptoms. No other areas of pain or concern or injury.  He is anticoagulated for a history of lower extremity DVT and paroxysmal atrial fibrillation.  Past Medical History  Diagnosis Date  . Ischemic cardiomyopathy   . CAD (coronary artery disease)   . S/P CABG x 5   . PAF (paroxysmal atrial fibrillation)   . Hypertension   . Hyperlipidemia   . RBBB   . ICD (implantable cardioverter-defibrillator), single, in situ   . DM (diabetes mellitus)     type 2   . DVT (deep venous thrombosis)    Past Surgical History  Procedure Laterality Date  . Coronary artery bypass graft  12/31/1995    LIMA to LAD,SVG to intermediate,SVG to CX,seq. svg to posterior descending and posterolateral RCA  . Cardiac catheterization  05/08/2009    small vessel disease  . Cardiac defibrillator placement  05/09/2009    Medtronic  . Back surgery    . I&d extremity Left 11/03/2013    Procedure: Left Leg Debride Ulcer, Apply Wound VAC and Theraskin;  Surgeon: Newt Minion, MD;  Location: [place];  Service: Orthopedics;  Laterality: Left;   Family History  Problem Relation Age of Onset  . Heart attack Mother   . Stroke Father    Social History  Substance Use Topics  . Smoking status: Former Smoker    Types: Cigarettes    Quit date: 11/03/1995  . Smokeless tobacco: Never Used  . Alcohol Use: No    Review of Systems  Constitutional: Negative for fever, chills, diaphoresis, appetite change and  fatigue.  HENT: Negative for mouth sores, sore throat and trouble swallowing.   Eyes: Negative for visual disturbance.  Respiratory: Negative for cough, chest tightness, shortness of breath and wheezing.   Cardiovascular: Negative for chest pain.  Gastrointestinal: Negative for nausea, vomiting, abdominal pain, diarrhea and abdominal distention.  Endocrine: Negative for polydipsia, polyphagia and polyuria.  Genitourinary: Negative for dysuria, frequency and hematuria.  Musculoskeletal: Negative for gait problem.  Skin: Positive for wound. Negative for color change, pallor and rash.  Neurological: Negative for dizziness, syncope, light-headedness and headaches.  Hematological: Does not bruise/bleed easily.  Psychiatric/Behavioral: Negative for behavioral problems and confusion.      Allergies  Review of patient's allergies indicates no known allergies.  Home Medications   Prior to Admission medications   Medication Sig Start Date End Date Taking? Authorizing Provider  ALPRAZolam Duanne Moron) 1 MG tablet Take 1 mg by mouth at bedtime as needed for sleep.    Historical Provider, MD  amiodarone (PACERONE) 200 MG tablet Take 200 mg by mouth daily.    Historical Provider, MD  aspirin EC 81 MG tablet Take 81 mg by mouth at bedtime.    Historical Provider, MD  atorvastatin (LIPITOR) 40 MG tablet Take 40 mg by mouth daily.    Historical Provider, MD  digoxin (LANOXIN) 0.125 MG tablet Take  0.125 mg by mouth daily.     Historical Provider, MD  fluocinonide cream (LIDEX) 5.27 % Apply 1 application topically daily. 08/28/14   Historical Provider, MD  levothyroxine (SYNTHROID, LEVOTHROID) 112 MCG tablet Take 112 mcg by mouth daily before breakfast.    Historical Provider, MD  magnesium oxide (MAG-OX) 400 MG tablet Take 400 mg by mouth daily.    Historical Provider, MD  metoprolol (LOPRESSOR) 50 MG tablet TAKE 1 TABLET BY MOUTH TWICE DAILY. 10/23/14   Lorretta Harp, MD  mupirocin ointment (BACTROBAN) 2  % Apply 1 application topically daily. 08/23/14   Historical Provider, MD  niacin (NIASPAN) 1000 MG CR tablet TAKE (1) TABLET BY MOUTH AT BEDTIME. 08/23/14   Lorretta Harp, MD  nitroGLYCERIN (NITROSTAT) 0.4 MG SL tablet Place 0.4 mg under the tongue every 5 (five) minutes as needed for chest pain.    Historical Provider, MD  oxyCODONE-acetaminophen (PERCOCET) 10-325 MG per tablet Take 1 tablet by mouth every 4 (four) hours as needed for pain.    Historical Provider, MD  pioglitazone (ACTOS) 45 MG tablet Take 45 mg by mouth daily.    Historical Provider, MD  PRADAXA 75 MG CAPS capsule TAKE 1 CAPSULE BY MOUTH EVERY 12 HOURS. 07/12/14   Troy Sine, MD  ramipril (ALTACE) 2.5 MG capsule Take 2.5 mg by mouth at bedtime.     Historical Provider, MD  ZETIA 10 MG tablet TAKE 1 TABLET ONCE DAILY FOR CHOLESTEROL. 08/23/14   Lorretta Harp, MD  zolpidem (AMBIEN) 10 MG tablet Take 10 mg by mouth at bedtime as needed for sleep.    Historical Provider, MD   BP 107/47 mmHg  Pulse 50  Temp(Src) 97.6 F (36.4 C) (Oral)  Resp 18  Ht 6\' 2"  (1.88 m)  Wt 180 lb (81.647 kg)  BMI 23.10 kg/m2  SpO2 97% Physical Exam  Constitutional: He is oriented to person, place, and time. He appears well-developed and well-nourished. No distress.  HENT:  Head: Normocephalic.    No blood from the TMs, mastoids, or around ears nose or mouth.  Eyes: Conjunctivae are normal. Pupils are equal, round, and reactive to light. No scleral icterus.  Neck: Normal range of motion. Neck supple. No thyromegaly present.  Cardiovascular: Normal rate and regular rhythm.  Exam reveals no gallop and no friction rub.   No murmur heard. Pulmonary/Chest: Effort normal and breath sounds normal. No respiratory distress. He has no wheezes. He has no rales.  Abdominal: Soft. Bowel sounds are normal. He exhibits no distension. There is no tenderness. There is no rebound.  Musculoskeletal: Normal range of motion.  Neurological: He is alert and  oriented to person, place, and time.  Skin: Skin is warm and dry. No rash noted.  Psychiatric: He has a normal mood and affect. His behavior is normal.    ED Course  Procedures (including critical care time) Labs Review Labs Reviewed - No data to display  Imaging Review Ct Head Wo Contrast  11/06/2014   CLINICAL DATA:  Fall. Struck head on floor. Laceration to top of head. Patient on blood thinners.  EXAM: CT HEAD WITHOUT CONTRAST  TECHNIQUE: Contiguous axial images were obtained from the base of the skull through the vertex without intravenous contrast.  COMPARISON:  07/02/2009  FINDINGS: Prominence of the sulci and ventricles identified consistent with brain atrophy. No acute cortical infarct, hemorrhage, or mass lesion ispresent. No significant extra-axial fluid collection is present. The paranasal sinuses andmastoid air cells are clear.  The osseous skull is intact. Right frontal scalp laceration is identified, image 36 of series 3.  IMPRESSION: 1. No acute intracranial abnormalities. 2. Brain atrophy. 3. Right frontal scalp laceration.   Electronically Signed   By: Kerby Moors M.D.   On: 11/06/2014 17:19   I have personally reviewed and evaluated these images and lab results as part of my medical decision-making.   EKG Interpretation None      MDM   Final diagnoses:  Scalp laceration, initial encounter    LACERATION REPAIR Performed by: Lolita Patella Authorized by: Lolita Patella Consent: Verbal consent obtained. Risks and benefits: risks, benefits and alternatives were discussed Consent given by: patient Patient identity confirmed: provided demographic data Prepped and Draped in normal sterile fashion Wound explored  Laceration Location: right parietal scalp  Laceration Length: 3cm  Total  No Foreign Bodies seen or palpated  Anesthesia: local infiltration  Local anesthetic: lidocaine 1% c epinephrine  Anesthetic total: 4 ml  Irrigation method:  syringe Amount of cleaning: standard  Skin closure: 4-0 Nylon  Number of sutures: 8  Technique: Horizontal mattress  Patient tolerance: Patient tolerated the procedure well with no immediate complications.     Tanna Furry, MD 11/06/14 2056

## 2014-11-06 NOTE — ED Notes (Signed)
Pt sitting down - cat was in his lap- when he stood up he tripped over cat and fell landed and struck head on floor- laceration to top of head -rt side- Pt is on blood thinner

## 2014-11-06 NOTE — Discharge Instructions (Signed)
Suture removal in 7-10 days 

## 2014-11-06 NOTE — ED Notes (Signed)
Notified Dr. Jeneen Rinks that Pt had laceration to head and was on blood thinners.  Dr. Jeneen Rinks at bedside.

## 2014-12-10 DIAGNOSIS — Z125 Encounter for screening for malignant neoplasm of prostate: Secondary | ICD-10-CM | POA: Diagnosis not present

## 2014-12-10 DIAGNOSIS — E782 Mixed hyperlipidemia: Secondary | ICD-10-CM | POA: Diagnosis not present

## 2014-12-10 DIAGNOSIS — E119 Type 2 diabetes mellitus without complications: Secondary | ICD-10-CM | POA: Diagnosis not present

## 2014-12-10 DIAGNOSIS — Z1389 Encounter for screening for other disorder: Secondary | ICD-10-CM | POA: Diagnosis not present

## 2014-12-10 DIAGNOSIS — N182 Chronic kidney disease, stage 2 (mild): Secondary | ICD-10-CM | POA: Diagnosis not present

## 2014-12-10 DIAGNOSIS — N529 Male erectile dysfunction, unspecified: Secondary | ICD-10-CM | POA: Diagnosis not present

## 2014-12-10 DIAGNOSIS — Z681 Body mass index (BMI) 19 or less, adult: Secondary | ICD-10-CM | POA: Diagnosis not present

## 2014-12-11 ENCOUNTER — Encounter: Payer: Self-pay | Admitting: Cardiovascular Disease

## 2014-12-11 ENCOUNTER — Ambulatory Visit (INDEPENDENT_AMBULATORY_CARE_PROVIDER_SITE_OTHER): Payer: Medicare Other | Admitting: *Deleted

## 2014-12-11 DIAGNOSIS — I255 Ischemic cardiomyopathy: Secondary | ICD-10-CM

## 2014-12-11 DIAGNOSIS — I5022 Chronic systolic (congestive) heart failure: Secondary | ICD-10-CM | POA: Diagnosis not present

## 2014-12-11 NOTE — Progress Notes (Signed)
Remote ICD transmission.   

## 2014-12-14 ENCOUNTER — Telehealth: Payer: Self-pay | Admitting: *Deleted

## 2014-12-14 LAB — CUP PACEART REMOTE DEVICE CHECK
Brady Statistic AP VP Percent: 96.97 %
Brady Statistic AP VS Percent: 0.01 %
Brady Statistic AS VP Percent: 3.02 %
Brady Statistic AS VS Percent: 0 %
Date Time Interrogation Session: 20160927041707
HIGH POWER IMPEDANCE MEASURED VALUE: 40 Ohm
HighPow Impedance: 51 Ohm
Lead Channel Impedance Value: 475 Ohm
Lead Channel Impedance Value: 494 Ohm
Lead Channel Impedance Value: 855 Ohm
Lead Channel Sensing Intrinsic Amplitude: 0.625 mV
Lead Channel Sensing Intrinsic Amplitude: 9.875 mV
Lead Channel Setting Pacing Amplitude: 2 V
MDC IDC MSMT BATTERY VOLTAGE: 2.65 V
MDC IDC MSMT LEADCHNL LV PACING THRESHOLD AMPLITUDE: 0.875 V
MDC IDC MSMT LEADCHNL LV PACING THRESHOLD PULSEWIDTH: 0.4 ms
MDC IDC MSMT LEADCHNL RA IMPEDANCE VALUE: 437 Ohm
MDC IDC MSMT LEADCHNL RV IMPEDANCE VALUE: 475 Ohm
MDC IDC SET LEADCHNL LV PACING PULSEWIDTH: 0.4 ms
MDC IDC SET LEADCHNL RA PACING AMPLITUDE: 2.5 V
MDC IDC SET LEADCHNL RV PACING AMPLITUDE: 2.5 V
MDC IDC SET LEADCHNL RV PACING PULSEWIDTH: 0.4 ms
MDC IDC SET LEADCHNL RV SENSING SENSITIVITY: 0.3 mV
MDC IDC SET ZONE DETECTION INTERVAL: 250 ms
MDC IDC SET ZONE DETECTION INTERVAL: 290 ms
MDC IDC SET ZONE DETECTION INTERVAL: 350 ms
MDC IDC SET ZONE DETECTION INTERVAL: 370 ms
MDC IDC SET ZONE DETECTION INTERVAL: 450 ms
MDC IDC STAT BRADY RA PERCENT PACED: 96.98 %
MDC IDC STAT BRADY RV PERCENT PACED: 99.99 %

## 2014-12-14 NOTE — Telephone Encounter (Signed)
Called patient regarding decreased thoracic impedence. Pt denies SOB, weight gain, LE edema. Will have Dr. Loletha Grayer review.

## 2014-12-19 DIAGNOSIS — D649 Anemia, unspecified: Secondary | ICD-10-CM | POA: Diagnosis not present

## 2014-12-19 DIAGNOSIS — N183 Chronic kidney disease, stage 3 (moderate): Secondary | ICD-10-CM | POA: Diagnosis not present

## 2014-12-27 ENCOUNTER — Encounter: Payer: Self-pay | Admitting: Cardiology

## 2014-12-28 ENCOUNTER — Encounter (HOSPITAL_COMMUNITY): Payer: Medicare Other | Attending: Oncology | Admitting: Oncology

## 2014-12-28 ENCOUNTER — Encounter (HOSPITAL_COMMUNITY): Payer: Self-pay | Admitting: Oncology

## 2014-12-28 VITALS — BP 128/45 | HR 78 | Temp 97.8°F | Resp 18 | Wt 181.4 lb

## 2014-12-28 DIAGNOSIS — R769 Abnormal immunological finding in serum, unspecified: Secondary | ICD-10-CM

## 2014-12-28 DIAGNOSIS — R778 Other specified abnormalities of plasma proteins: Secondary | ICD-10-CM

## 2014-12-28 NOTE — Patient Instructions (Signed)
..  [place] at [facility] Discharge Instructions  RECOMMENDATIONS MADE BY THE CONSULTANT AND ANY TEST RESULTS WILL BE SENT TO YOUR REFERRING PHYSICIAN.  Labs from Dr. Gerarda Fraction  Return in 1- 2 weeks  Thank you for choosing [place] at [facility] to provide your oncology and hematology care.  To afford each patient quality time with our provider, please arrive at least 15 minutes before your scheduled appointment time.    You need to re-schedule your appointment should you arrive 10 or more minutes late.  We strive to give you quality time with our providers, and arriving late affects you and other patients whose appointments are after yours.  Also, if you no show three or more times for appointments you may be dismissed from the clinic at the providers discretion.     Again, thank you for choosing [facility].  Our hope is that these requests will decrease the amount of time that you wait before being seen by our physicians.       _____________________________________________________________  Should you have questions after your visit to [facility], please contact our office at [phone] between the hours of 8:30 a.m. and 4:30 p.m.  Voicemails left after 4:30 p.m. will not be returned until the following business day.  For prescription refill requests, have your pharmacy contact our office.

## 2014-12-28 NOTE — Assessment & Plan Note (Signed)
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  No Bence Jones proteinuria on previous testing.  Return in 1-2 weeks.  At that time, we will perform labs: MM panel, ESR, CRP, LDH, B2M.    Additionally, we will discuss bone marrow aspiration and biopsy here at the clinic.

## 2014-12-28 NOTE — Progress Notes (Signed)
Glo Herring., MD [place] [address]  Abnormal serum protein electrophoresis - Plan: CBC with Differential, Comprehensive metabolic panel, Lactate dehydrogenase, Sedimentation rate, C-reactive protein, Beta 2 microglobuline, serum, Multiple myeloma panel, serum, Kappa/lambda light chains  CURRENT THERAPY: Patient cancelled all of his appointments due to cost, so we will re-initiate work-up moving forward.  INTERVAL HISTORY: Alan Orr 69 y.o. male returns for followup of elevated kappa/lambda light chains and ratio.  He was last seen in the clinic on 08/21/14.  At that appointment time, he was referred to IR for bone marrow aspiration and biopsy with short interval follow-up.  He cancelled all appointments because he lost his supplemental insurance and could not afford any further testing and follow-up.    He explained his financial and insurance situation to me and I completely understand his cancellations.  He notes that he has seen Dr. Gerarda Fraction, who performed labs recently.  Additionally, he has seen his nephrologist.  The patient reports that Dr. Candiss Norse is happy with his current renal function with a mild improvement.  Mr. Andreoni is not opposed to further testing and follow-up, his issues are truly financial in nature.  Given his situation, we will start slow with his workup.  Past Medical History  Diagnosis Date  . Ischemic cardiomyopathy   . CAD (coronary artery disease)   . S/P CABG x 5   . PAF (paroxysmal atrial fibrillation) ([facility])   . Hypertension   . Hyperlipidemia   . RBBB   . ICD (implantable cardioverter-defibrillator), single, in situ   . DM (diabetes mellitus) ([facility])     type 2   . DVT (deep venous thrombosis) ([facility])     has DM; Chronic systolic heart failure ([facility]); PACEMAKER, PERMANENT; Biventricular ICD (implantable cardioverter-defibrillator) in place; Cardiomyopathy, ischemic; CAD s/p CABG ; Hyperlipidemia; Paroxysmal  atrial fibrillation ([facility]); Ventricular tachycardia ([facility]); History of carotid artery disease; Leg wound, left; and Abnormal serum protein electrophoresis on his problem list.     has No Known Allergies.  Current Outpatient Prescriptions on File Prior to Visit  Medication Sig Dispense Refill  . ALPRAZolam (XANAX) 1 MG tablet Take 1 mg by mouth at bedtime as needed for sleep.    Marland Kitchen amiodarone (PACERONE) 200 MG tablet Take 200 mg by mouth daily.    Marland Kitchen aspirin EC 81 MG tablet Take 81 mg by mouth at bedtime.    Marland Kitchen atorvastatin (LIPITOR) 40 MG tablet Take 40 mg by mouth daily.    . digoxin (LANOXIN) 0.125 MG tablet Take 0.125 mg by mouth daily.     . fluocinonide cream (LIDEX) 5.73 % Apply 1 application topically daily.    Marland Kitchen levothyroxine (SYNTHROID, LEVOTHROID) 112 MCG tablet Take 112 mcg by mouth daily before breakfast.    . magnesium oxide (MAG-OX) 400 MG tablet Take 400 mg by mouth daily.    . metoprolol (LOPRESSOR) 50 MG tablet TAKE 1 TABLET BY MOUTH TWICE DAILY. 180 tablet 3  . mupirocin ointment (BACTROBAN) 2 % Apply 1 application topically daily.    . niacin (NIASPAN) 1000 MG CR tablet TAKE (1) TABLET BY MOUTH AT BEDTIME. 30 tablet 11  . oxyCODONE-acetaminophen (PERCOCET) 10-325 MG per tablet Take 1 tablet by mouth every 4 (four) hours as needed for pain.    . pioglitazone (ACTOS) 45 MG tablet Take 45 mg by mouth daily.    Marland Kitchen PRADAXA 75 MG CAPS capsule TAKE 1 CAPSULE BY MOUTH EVERY 12 HOURS. 60 capsule  6  . ramipril (ALTACE) 2.5 MG capsule Take 2.5 mg by mouth at bedtime.     Marland Kitchen ZETIA 10 MG tablet TAKE 1 TABLET ONCE DAILY FOR CHOLESTEROL. 30 tablet 11  . zolpidem (AMBIEN) 10 MG tablet Take 10 mg by mouth at bedtime as needed for sleep.    . nitroGLYCERIN (NITROSTAT) 0.4 MG SL tablet Place 0.4 mg under the tongue every 5 (five) minutes as needed for chest pain.     No current facility-administered medications on file prior to visit.    Past Surgical History  Procedure Laterality Date  .  Coronary artery bypass graft  12/31/1995    LIMA to LAD,SVG to intermediate,SVG to CX,seq. svg to posterior descending and posterolateral RCA  . Cardiac catheterization  05/08/2009    small vessel disease  . Cardiac defibrillator placement  05/09/2009    [employer]  . Back surgery    . I&d extremity Left 11/03/2013    Procedure: Left Leg Debride Ulcer, Apply Wound VAC and Theraskin;  Surgeon: Newt Minion, MD;  Location: [facility];  Service: Orthopedics;  Laterality: Left;    Denies any headaches, dizziness, double vision, fevers, chills, night sweats, nausea, vomiting, diarrhea, constipation, chest pain, heart palpitations, shortness of breath, blood in stool, black tarry stool, urinary pain, urinary burning, urinary frequency, hematuria.   PHYSICAL EXAMINATION  ECOG PERFORMANCE STATUS: 1 - Symptomatic but completely ambulatory  Filed Vitals:   12/28/14 1137  BP: 128/45  Pulse: 78  Temp: 97.8 F (36.6 C)  Resp: 18    GENERAL:alert, no distress, well nourished, well developed, comfortable, cooperative, smiling and accompanied by wife SKIN: skin color, texture, turgor are normal, no rashes or significant lesions HEAD: Normocephalic, No masses, lesions, tenderness or abnormalities EYES: normal, PERRLA, EOMI, Conjunctiva are pink and non-injected EARS: External ears normal OROPHARYNX:lips, buccal mucosa, and tongue normal and mucous membranes are moist  NECK: supple, no adenopathy, trachea midline LYMPH:  not examined BREAST:not examined LUNGS: clear to auscultation  HEART: regular rate & rhythm, no murmurs and no gallops ABDOMEN:abdomen soft and normal bowel sounds BACK: Back symmetric, no curvature. EXTREMITIES:less then 2 second capillary refill, no joint deformities, effusion, or inflammation, no skin discoloration, no cyanosis  NEURO: alert & oriented x 3 with fluent speech, no focal motor/sensory deficits, gait normal   LABORATORY DATA: CBC    Component Value Date/Time    WBC 4.8 07/31/2014 1528   RBC 3.37* 07/31/2014 1528   HGB 11.1* 07/31/2014 1528   HCT 35.1* 07/31/2014 1528   PLT 281 07/31/2014 1528   MCV 104.2* 07/31/2014 1528   MCH 32.9 07/31/2014 1528   MCHC 31.6 07/31/2014 1528   RDW 14.7 07/31/2014 1528   LYMPHSABS 2.0 07/31/2014 1528   MONOABS 0.5 07/31/2014 1528   EOSABS 0.2 07/31/2014 1528   BASOSABS 0.1 07/31/2014 1528      Chemistry      Component Value Date/Time   NA 138 07/31/2014 1528   K 3.7 07/31/2014 1528   CL 101 07/31/2014 1528   CO2 29 07/31/2014 1528   BUN 12 07/31/2014 1528   CREATININE 1.33* 07/31/2014 1528      Component Value Date/Time   CALCIUM 8.7* 07/31/2014 1528   ALKPHOS 78 07/31/2014 1528   AST 40 07/31/2014 1528   ALT 27 07/31/2014 1528   BILITOT 0.6 07/31/2014 1528      Labs from Dr. Gerarda Fraction on 12/10/2014: WBC 6.8 HGB 11.9 g/dL Plt: 225 BUN 19 Creatinine: 1.61 GFR 43 Protein 6.5  PENDING LABS:   RADIOGRAPHIC STUDIES:  No results found.   PATHOLOGY:    ASSESSMENT AND PLAN:  Abnormal serum protein electrophoresis Elevated Kappa and Lamba light chains with an elevated ratio without a detectable serum M-spike.  No Bence Jones proteinuria on previous testing.  Return in 1-2 weeks.  At that time, we will perform labs: MM panel, ESR, CRP, LDH, B2M.    Additionally, we will discuss bone marrow aspiration and biopsy here at the clinic.    THERAPY PLAN:  After reviewing labs performed at his primary care provider's office, we will not repeat any of these.  I will perform MM specific labs in 1-2 weeks when he returns.  We will also need to discuss bone marrow aspiration and biopsy.  All questions were answered. The patient knows to call the clinic with any problems, questions or concerns. We can certainly see the patient much sooner if necessary.  Patient and plan discussed with Dr. Ancil Linsey and she is in agreement with the aforementioned.   This note is electronically signed by:  Doy Mince 12/28/2014 4:11 PM

## 2015-01-05 ENCOUNTER — Encounter (HOSPITAL_COMMUNITY): Payer: Self-pay | Admitting: Emergency Medicine

## 2015-01-05 ENCOUNTER — Emergency Department (HOSPITAL_COMMUNITY): Payer: Medicare Other

## 2015-01-05 ENCOUNTER — Emergency Department (HOSPITAL_COMMUNITY)
Admission: EM | Admit: 2015-01-05 | Discharge: 2015-01-05 | Disposition: A | Discharge status: Home / Self Care | Payer: Medicare Other | Source: Home / Self Care | Attending: Emergency Medicine | Admitting: Emergency Medicine

## 2015-01-05 DIAGNOSIS — R509 Fever, unspecified: Secondary | ICD-10-CM | POA: Diagnosis not present

## 2015-01-05 DIAGNOSIS — B9561 Methicillin susceptible Staphylococcus aureus infection as the cause of diseases classified elsewhere: Secondary | ICD-10-CM | POA: Diagnosis not present

## 2015-01-05 DIAGNOSIS — L03115 Cellulitis of right lower limb: Secondary | ICD-10-CM | POA: Diagnosis not present

## 2015-01-05 DIAGNOSIS — E1122 Type 2 diabetes mellitus with diabetic chronic kidney disease: Secondary | ICD-10-CM | POA: Diagnosis not present

## 2015-01-05 DIAGNOSIS — I480 Paroxysmal atrial fibrillation: Secondary | ICD-10-CM | POA: Diagnosis not present

## 2015-01-05 DIAGNOSIS — I255 Ischemic cardiomyopathy: Secondary | ICD-10-CM | POA: Diagnosis not present

## 2015-01-05 DIAGNOSIS — I10 Essential (primary) hypertension: Secondary | ICD-10-CM | POA: Diagnosis not present

## 2015-01-05 DIAGNOSIS — R7881 Bacteremia: Secondary | ICD-10-CM | POA: Diagnosis not present

## 2015-01-05 LAB — URINALYSIS, ROUTINE W REFLEX MICROSCOPIC
BILIRUBIN URINE: NEGATIVE
Glucose, UA: NEGATIVE mg/dL
KETONES UR: NEGATIVE mg/dL
LEUKOCYTES UA: NEGATIVE
NITRITE: NEGATIVE
PH: 6 (ref 5.0–8.0)
Specific Gravity, Urine: 1.02 (ref 1.005–1.030)
UROBILINOGEN UA: 0.2 mg/dL (ref 0.0–1.0)

## 2015-01-05 LAB — CBC
HCT: 30.5 % — ABNORMAL LOW (ref 39.0–52.0)
HEMOGLOBIN: 9.8 g/dL — AB (ref 13.0–17.0)
MCH: 33.1 pg (ref 26.0–34.0)
MCHC: 32.1 g/dL (ref 30.0–36.0)
MCV: 103 fL — AB (ref 78.0–100.0)
Platelets: 137 10*3/uL — ABNORMAL LOW (ref 150–400)
RBC: 2.96 MIL/uL — AB (ref 4.22–5.81)
RDW: 16.3 % — ABNORMAL HIGH (ref 11.5–15.5)
WBC: 10.2 10*3/uL (ref 4.0–10.5)

## 2015-01-05 LAB — BASIC METABOLIC PANEL
ANION GAP: 7 (ref 5–15)
BUN: 26 mg/dL — AB (ref 6–20)
CHLORIDE: 102 mmol/L (ref 101–111)
CO2: 29 mmol/L (ref 22–32)
Calcium: 8.6 mg/dL — ABNORMAL LOW (ref 8.9–10.3)
Creatinine, Ser: 1.61 mg/dL — ABNORMAL HIGH (ref 0.61–1.24)
GFR calc Af Amer: 48 mL/min — ABNORMAL LOW (ref >60)
GFR calc non Af Amer: 42 mL/min — ABNORMAL LOW (ref >60)
Glucose, Bld: 98 mg/dL (ref 65–99)
POTASSIUM: 4.1 mmol/L (ref 3.5–5.1)
SODIUM: 138 mmol/L (ref 135–145)

## 2015-01-05 LAB — LACTIC ACID, PLASMA: LACTIC ACID, VENOUS: 1.3 mmol/L (ref 0.5–2.0)

## 2015-01-05 LAB — URINE MICROSCOPIC-ADD ON

## 2015-01-05 MED ORDER — CLINDAMYCIN HCL 300 MG PO CAPS: 300.0000 mg | ORAL_CAPSULE | Freq: Three times a day (TID) | ORAL | Status: DC

## 2015-01-05 MED ORDER — PIPERACILLIN-TAZOBACTAM 3.375 G IVPB
3.3750 g | Freq: Once | INTRAVENOUS | Status: AC
  Administered 2015-01-05: 3.375 g via INTRAVENOUS
  Filled 2015-01-05: qty 50

## 2015-01-05 MED ORDER — SODIUM CHLORIDE 0.9 % IV BOLUS (SEPSIS)
1000.0000 mL | Freq: Once | INTRAVENOUS | Status: AC
  Administered 2015-01-05: 1000 mL via INTRAVENOUS

## 2015-01-05 MED ORDER — VANCOMYCIN HCL IN DEXTROSE 1-5 GM/200ML-% IV SOLN
1000.0000 mg | Freq: Once | INTRAVENOUS | Status: AC
  Administered 2015-01-05: 1000 mg via INTRAVENOUS
  Filled 2015-01-05: qty 200

## 2015-01-05 NOTE — ED Notes (Addendum)
Pt c/o fever/chills since yesterday. Denies n/v/d/cough. Pt wife states pt has been seeing nephrologist d/t elevated labwork. Erythema noted to rle.

## 2015-01-05 NOTE — Discharge Instructions (Signed)
Return to the ED with any concerns including vomiting and not able to keep down liquids or antibiotics, ongoing fever, increased area of redness or increased pain, swelling of leg, decreased level of alertness/lethargy, or any other alarming symptoms

## 2015-01-05 NOTE — ED Provider Notes (Signed)
CSN: [id number]     Arrival date & time 01/05/15  1025 History  By signing my name below, I, Alan Orr, attest that this documentation has been prepared under the direction and in the presence of Alan Beers, MD. Electronically Signed: Soijett Orr, ED Scribe. 01/05/2015. 10:59 AM.  Chief Complaint  Patient presents with  . Fever      Patient is a 69 y.o. male presenting with fever. The history is provided by the patient and the spouse. No language interpreter was used.  Fever Max temp prior to arrival:  99.5 Temp source:  Subjective Onset quality:  Sudden Duration:  1 day Timing:  Constant Progression:  Unchanged Chronicity:  New Relieved by:  Acetaminophen Worsened by:  Nothing tried Ineffective treatments:  None tried Associated symptoms: chills   Associated symptoms: no cough, no diarrhea, no nausea and no vomiting   Risk factors: no recent surgery     HPI Comments: Alan Orr is a 69 y.o. male who presents to the Emergency Department complaining of a low grade fever onset last night. Wife notes that she called EMS last night due to the pt having a low grade fever and him shaking. Wife states that she gave the pt tylenol after EMS saw the pt for the relief of his symptoms with the last dose being 3 AM. Wife reports that EMS Wanted to bring the pt to the hospital for further evaluation to which they declined at the time. He states that he is having associated symptoms of redness to RLE with pain noted yesterday. Wife is unsure of how long the redness to the RLE has been present but she notes that the pt complained of pain to the area yesterday. He denies any other symptoms. Denies any recent surgeries. Pt PCP is Dr. Gerarda Fraction.    Past Medical History  Diagnosis Date  . Ischemic cardiomyopathy   . CAD (coronary artery disease)   . S/P CABG x 5   . PAF (paroxysmal atrial fibrillation) ([facility])   . Hypertension   . Hyperlipidemia   . RBBB   . ICD (implantable  cardioverter-defibrillator), single, in situ   . DM (diabetes mellitus) ([facility])     type 2   . DVT (deep venous thrombosis) Alan Orr)    Past Surgical History  Procedure Laterality Date  . Coronary artery bypass graft  12/31/1995    LIMA to LAD,SVG to intermediate,SVG to CX,seq. svg to posterior descending and posterolateral RCA  . Cardiac catheterization  05/08/2009    small vessel disease  . Cardiac defibrillator placement  05/09/2009    [employer]  . Back surgery    . I&d extremity Left 11/03/2013    Procedure: Left Leg Debride Ulcer, Apply Wound VAC and Theraskin;  Surgeon: Newt Minion, MD;  Location: [facility];  Service: Orthopedics;  Laterality: Left;   Family History  Problem Relation Age of Onset  . Heart attack Mother   . Stroke Father    Social History  Substance Use Topics  . Smoking status: Former Smoker    Types: Cigarettes    Quit date: 11/03/1995  . Smokeless tobacco: Never Used  . Alcohol Use: No    Review of Systems  Constitutional: Positive for fever and chills.  Respiratory: Negative for cough.   Gastrointestinal: Negative for nausea, vomiting and diarrhea.  All other systems reviewed and are negative.     Allergies  Review of patient's allergies indicates no known allergies.  Home Medications   Prior to  Admission medications   Medication Sig Start Date End Date Taking? Authorizing Provider  ALPRAZolam Alan Orr) 1 MG tablet Take 1 mg by mouth daily as needed for anxiety or sleep.    Yes Historical Provider, MD  amiodarone (PACERONE) 200 MG tablet Take 200 mg by mouth daily.   Yes Historical Provider, MD  aspirin EC 81 MG tablet Take 81 mg by mouth at bedtime.   Yes Historical Provider, MD  atorvastatin (LIPITOR) 40 MG tablet Take 40 mg by mouth daily.   Yes Historical Provider, MD  digoxin (LANOXIN) 0.125 MG tablet Take 0.125 mg by mouth daily.    Yes Historical Provider, MD  levothyroxine (SYNTHROID, LEVOTHROID) 112 MCG tablet Take 112 mcg by mouth daily  before breakfast.   Yes Historical Provider, MD  magnesium oxide (MAG-OX) 400 MG tablet Take 400 mg by mouth daily.   Yes Historical Provider, MD  metoprolol (LOPRESSOR) 50 MG tablet TAKE 1 TABLET BY MOUTH TWICE DAILY. 10/23/14  Yes Alan Harp, MD  niacin (NIASPAN) 1000 MG CR tablet TAKE (1) TABLET BY MOUTH AT BEDTIME. 08/23/14  Yes Alan Harp, MD  nitroGLYCERIN (NITROSTAT) 0.4 MG SL tablet Place 0.4 mg under the tongue every 5 (five) minutes as needed for chest pain.   Yes Historical Provider, MD  oxyCODONE-acetaminophen (PERCOCET) 10-325 MG per tablet Take 1 tablet by mouth every 4 (four) hours as needed for pain.   Yes Historical Provider, MD  pioglitazone (ACTOS) 45 MG tablet Take 45 mg by mouth daily.   Yes Historical Provider, MD  PRADAXA 75 MG CAPS capsule TAKE 1 CAPSULE BY MOUTH EVERY 12 HOURS. 07/12/14  Yes Alan Sine, MD  ramipril (ALTACE) 2.5 MG capsule Take 2.5 mg by mouth at bedtime.    Yes Historical Provider, MD  ZETIA 10 MG tablet TAKE 1 TABLET ONCE DAILY FOR CHOLESTEROL. 08/23/14  Yes Alan Harp, MD  zolpidem (AMBIEN) 10 MG tablet Take 10 mg by mouth at bedtime as needed for sleep.   Yes Historical Provider, MD  clindamycin (CLEOCIN) 300 MG capsule Take 1 capsule (300 mg total) by mouth 3 (three) times daily. 01/05/15   Alan Beers, MD   BP 130/58 mmHg  Pulse 85  Temp(Src) 99 F (37.2 C) (Oral)  Resp 16  Ht 6\' 2"  (1.88 m)  Wt 181 lb (82.101 kg)  BMI 23.23 kg/m2  SpO2 100%  Vitals reviewed Physical Exam  Physical Examination: General appearance - alert, well appearing, and in no distress Mental status - alert, oriented to person, place, and time Eyes - no conjunctival injection, no scleral icterus Mouth - mucous membranes moist, pharynx normal without lesions Chest - clear to auscultation, no wheezes, rales or rhonchi, symmetric air entry Heart - normal rate, regular rhythm, normal S1, S2, no murmurs, rubs, clicks or gallops Abdomen - soft, nontender,  nondistended, no masses or organomegaly Neurological - alert, oriented, normal speech Extremities - peripheral pulses normal, no pedal edema, no clubbing or cyanosis, right lower extremity with deep erythema overlying anterior tibial region, warm to touch, no fluctuance or drainage Skin - normal coloration and turgor except area of erythema overlying right anterior tibial region  ED Course  Procedures (including critical care time) DIAGNOSTIC STUDIES: Oxygen Saturation is 99% on RA, nl by my interpretation.    COORDINATION OF CARE: 10:56 AM Discussed treatment plan with pt at bedside which includes abx, CXR, and labs and pt agreed to plan.  ED ECG REPORT   Date: 01/05/2015  Rate:  70  Rhythm: paced rhythm  QRS Axis: right  Intervals: indeterminate  ST/T Wave abnormalities: indeterminate  Conduction Disutrbances:paced rhythm  Narrative Interpretation:   Old EKG Reviewed: none available    Labs Review Labs Reviewed  CBC - Abnormal; Notable for the following:    RBC 2.96 (*)    Hemoglobin 9.8 (*)    HCT 30.5 (*)    MCV 103.0 (*)    RDW 16.3 (*)    Platelets 137 (*)    All other components within normal limits  BASIC METABOLIC PANEL - Abnormal; Notable for the following:    BUN 26 (*)    Creatinine, Ser 1.61 (*)    Calcium 8.6 (*)    GFR calc non Af Amer 42 (*)    GFR calc Af Amer 48 (*)    All other components within normal limits  URINALYSIS, ROUTINE W REFLEX MICROSCOPIC (NOT AT [facility]) - Abnormal; Notable for the following:    Hgb urine dipstick SMALL (*)    Protein, ur TRACE (*)    All other components within normal limits  URINE MICROSCOPIC-ADD ON - Abnormal; Notable for the following:    Bacteria, UA FEW (*)    All other components within normal limits  CULTURE, BLOOD (ROUTINE X 2)  CULTURE, BLOOD (ROUTINE X 2)  URINE CULTURE  LACTIC ACID, PLASMA    Imaging Review Dg Chest 2 View  01/05/2015  CLINICAL DATA:  Fever, chills, shaking, fall risk, coronary  disease post CABG, diabetes mellitus, ischemic cardiomyopathy, hypertension, former smoker EXAM: CHEST  2 VIEW COMPARISON:  08/10/2014, 11/03/2013 FINDINGS: LEFT subclavian AICD leads project at RIGHT atrium, RIGHT ventricle and coronary sinus. Enlargement of cardiac silhouette post CABG. Atherosclerotic calcifications aorta. Emphysematous changes with minimal RIGHT basilar atelectasis. Question RIGHT nipple shadow Lungs otherwise clear. No pleural effusion or pneumothorax. Bones demineralized. IMPRESSION: Emphysematous changes with minimal RIGHT basilar atelectasis. Enlargement of cardiac silhouette post CABG and AICD. Question RIGHT nipple shadow ; repeat PA chest radiograph with nipple markers recommended to exclude pulmonary nodule. Electronically Signed   By: Lavonia Dana M.D.   On: 01/05/2015 12:04   I have personally reviewed and evaluated these images and lab results as part of my medical decision-making.   EKG Interpretation None      MDM   Final diagnoses:  Cellulitis of right lower extremity   Pt presenting with fever/chills, area of cellulitis on right lower extremity.  Labs and vital signs are reassuring.  Blood and urine cultures obtained.  CXR reassuring as well.  I feel the cellulitis is his source for infection.  Lactate normal.  Pt is feeling much imrpoved and would like a trial of outpatient antibiotics.    Dagoberto Ligas, personally performed the services described in this documentation. All medical record entries made by the scribe were at my direction and in my presence.  I have reviewed the chart and discharge instructions and agree that the record reflects my personal performance and is accurate and complete. Threasa Beards.  01/05/2015. 3:10 PM.    1:56 PM pt is feeling much better.  He would much prefer to try oral antibiotics treatment at home.  Vitals are stable, he does not have a leukocytosis.  I think it is reasonable to try outpatient treatment for his  cellultiis.    Alan Beers, MD 01/05/15 [id number]

## 2015-01-07 ENCOUNTER — Telehealth (HOSPITAL_COMMUNITY): Payer: Self-pay

## 2015-01-07 ENCOUNTER — Encounter (HOSPITAL_COMMUNITY): Payer: Self-pay | Admitting: *Deleted

## 2015-01-07 ENCOUNTER — Inpatient Hospital Stay (HOSPITAL_COMMUNITY)
Admission: EM | Admit: 2015-01-07 | Discharge: 2015-01-12 | DRG: 603 | Disposition: A | Discharge status: Home / Self Care | Payer: Medicare Other | Attending: Internal Medicine | Admitting: Internal Medicine

## 2015-01-07 DIAGNOSIS — E785 Hyperlipidemia, unspecified: Secondary | ICD-10-CM | POA: Diagnosis present

## 2015-01-07 DIAGNOSIS — Z86718 Personal history of other venous thrombosis and embolism: Secondary | ICD-10-CM

## 2015-01-07 DIAGNOSIS — I129 Hypertensive chronic kidney disease with stage 1 through stage 4 chronic kidney disease, or unspecified chronic kidney disease: Secondary | ICD-10-CM | POA: Diagnosis present

## 2015-01-07 DIAGNOSIS — Z7982 Long term (current) use of aspirin: Secondary | ICD-10-CM

## 2015-01-07 DIAGNOSIS — E039 Hypothyroidism, unspecified: Secondary | ICD-10-CM | POA: Diagnosis present

## 2015-01-07 DIAGNOSIS — D649 Anemia, unspecified: Secondary | ICD-10-CM | POA: Diagnosis present

## 2015-01-07 DIAGNOSIS — Z95810 Presence of automatic (implantable) cardiac defibrillator: Secondary | ICD-10-CM | POA: Diagnosis not present

## 2015-01-07 DIAGNOSIS — Z951 Presence of aortocoronary bypass graft: Secondary | ICD-10-CM

## 2015-01-07 DIAGNOSIS — R778 Other specified abnormalities of plasma proteins: Secondary | ICD-10-CM | POA: Diagnosis present

## 2015-01-07 DIAGNOSIS — Z87891 Personal history of nicotine dependence: Secondary | ICD-10-CM

## 2015-01-07 DIAGNOSIS — I2510 Atherosclerotic heart disease of native coronary artery without angina pectoris: Secondary | ICD-10-CM | POA: Diagnosis not present

## 2015-01-07 DIAGNOSIS — R769 Abnormal immunological finding in serum, unspecified: Secondary | ICD-10-CM | POA: Diagnosis not present

## 2015-01-07 DIAGNOSIS — Z7902 Long term (current) use of antithrombotics/antiplatelets: Secondary | ICD-10-CM

## 2015-01-07 DIAGNOSIS — N183 Chronic kidney disease, stage 3 (moderate): Secondary | ICD-10-CM | POA: Diagnosis present

## 2015-01-07 DIAGNOSIS — E1122 Type 2 diabetes mellitus with diabetic chronic kidney disease: Secondary | ICD-10-CM | POA: Diagnosis present

## 2015-01-07 DIAGNOSIS — B9561 Methicillin susceptible Staphylococcus aureus infection as the cause of diseases classified elsewhere: Secondary | ICD-10-CM | POA: Diagnosis present

## 2015-01-07 DIAGNOSIS — I480 Paroxysmal atrial fibrillation: Secondary | ICD-10-CM | POA: Diagnosis present

## 2015-01-07 DIAGNOSIS — I255 Ischemic cardiomyopathy: Secondary | ICD-10-CM | POA: Diagnosis present

## 2015-01-07 DIAGNOSIS — R7881 Bacteremia: Secondary | ICD-10-CM | POA: Diagnosis present

## 2015-01-07 DIAGNOSIS — L03115 Cellulitis of right lower limb: Secondary | ICD-10-CM | POA: Diagnosis not present

## 2015-01-07 LAB — COMPREHENSIVE METABOLIC PANEL
ALK PHOS: 76 U/L (ref 38–126)
ALT: 37 U/L (ref 17–63)
AST: 51 U/L — ABNORMAL HIGH (ref 15–41)
Albumin: 3.1 g/dL — ABNORMAL LOW (ref 3.5–5.0)
Anion gap: 7 (ref 5–15)
BILIRUBIN TOTAL: 0.8 mg/dL (ref 0.3–1.2)
BUN: 22 mg/dL — AB (ref 6–20)
CALCIUM: 8.6 mg/dL — AB (ref 8.9–10.3)
CO2: 29 mmol/L (ref 22–32)
Chloride: 101 mmol/L (ref 101–111)
Creatinine, Ser: 1.49 mg/dL — ABNORMAL HIGH (ref 0.61–1.24)
GFR calc Af Amer: 53 mL/min — ABNORMAL LOW (ref >60)
GFR calc non Af Amer: 46 mL/min — ABNORMAL LOW (ref >60)
GLUCOSE: 105 mg/dL — AB (ref 65–99)
POTASSIUM: 4.2 mmol/L (ref 3.5–5.1)
SODIUM: 137 mmol/L (ref 135–145)
TOTAL PROTEIN: 6.5 g/dL (ref 6.5–8.1)

## 2015-01-07 LAB — CBC WITH DIFFERENTIAL/PLATELET
Basophils Absolute: 0 10*3/uL (ref 0.0–0.1)
Basophils Relative: 0 %
EOS ABS: 0.2 10*3/uL (ref 0.0–0.7)
Eosinophils Relative: 3 %
HEMATOCRIT: 30.3 % — AB (ref 39.0–52.0)
HEMOGLOBIN: 9.7 g/dL — AB (ref 13.0–17.0)
LYMPHS ABS: 1.7 10*3/uL (ref 0.7–4.0)
LYMPHS PCT: 22 %
MCH: 32.9 pg (ref 26.0–34.0)
MCHC: 32 g/dL (ref 30.0–36.0)
MCV: 102.7 fL — ABNORMAL HIGH (ref 78.0–100.0)
Monocytes Absolute: 0.6 10*3/uL (ref 0.1–1.0)
Monocytes Relative: 8 %
NEUTROS ABS: 5 10*3/uL (ref 1.7–7.7)
NEUTROS PCT: 67 %
Platelets: 182 10*3/uL (ref 150–400)
RBC: 2.95 MIL/uL — AB (ref 4.22–5.81)
RDW: 16.1 % — ABNORMAL HIGH (ref 11.5–15.5)
WBC: 7.5 10*3/uL (ref 4.0–10.5)

## 2015-01-07 LAB — URINE CULTURE: Culture: 1000

## 2015-01-07 LAB — I-STAT CG4 LACTIC ACID, ED: Lactic Acid, Venous: 0.94 mmol/L (ref 0.5–2.0)

## 2015-01-07 MED ORDER — VANCOMYCIN HCL 10 G IV SOLR
1500.0000 mg | Freq: Once | INTRAVENOUS | Status: AC
  Administered 2015-01-07: 1500 mg via INTRAVENOUS
  Filled 2015-01-07: qty 1500

## 2015-01-07 NOTE — ED Provider Notes (Signed)
CSN: [id number]     Arrival date & time 01/07/15  2109 History   First MD Initiated Contact with Patient 01/07/15 2119     Chief Complaint  Patient presents with  . Blood Infection     (Consider location/radiation/quality/duration/timing/severity/associated sxs/prior Treatment) Patient is a 69 y.o. male presenting with rash.  Rash Location:  Leg Leg rash location:  R lower leg Quality: redness and swelling   Quality: not painful (was more painful with walking prior to receiving abx 2 days ago)   Onset quality:  Gradual Duration:  6 days Timing:  Constant Progression:  Improving Chronicity:  New Relieved by: antibiotics (received IV abx followed by oral) Associated symptoms: no abdominal pain, no diarrhea, no fever (resolved, had fever 2 days ago), no headaches, no nausea, no shortness of breath, no sore throat and not vomiting     Past Medical History  Diagnosis Date  . Ischemic cardiomyopathy   . CAD (coronary artery disease)   . S/P CABG x 5   . PAF (paroxysmal atrial fibrillation) ([place])   . Hypertension   . Hyperlipidemia   . RBBB   . ICD (implantable cardioverter-defibrillator), single, in situ   . DM (diabetes mellitus) ([place])     type 2   . DVT (deep venous thrombosis) [facility])    Past Surgical History  Procedure Laterality Date  . Coronary artery bypass graft  12/31/1995    LIMA to LAD,SVG to intermediate,SVG to CX,seq. svg to posterior descending and posterolateral RCA  . Cardiac catheterization  05/08/2009    small vessel disease  . Cardiac defibrillator placement  05/09/2009    [employer]  . Back surgery    . I&d extremity Left 11/03/2013    Procedure: Left Leg Debride Ulcer, Apply Wound VAC and Theraskin;  Surgeon: Newt Minion, MD;  Location: [place];  Service: Orthopedics;  Laterality: Left;   Family History  Problem Relation Age of Onset  . Heart attack Mother   . Stroke Father    Social History  Substance Use Topics  . Smoking status: Former Smoker     Types: Cigarettes    Quit date: 11/03/1995  . Smokeless tobacco: Never Used  . Alcohol Use: No    Review of Systems  Constitutional: Negative for fever (resolved, had fever 2 days ago) and chills (resolved, had fever 2 days ago).  HENT: Negative for sore throat.   Eyes: Negative for visual disturbance.  Respiratory: Negative for shortness of breath.   Cardiovascular: Negative for chest pain.  Gastrointestinal: Negative for nausea, vomiting, abdominal pain and diarrhea.  Genitourinary: Negative for difficulty urinating.  Musculoskeletal: Negative for back pain and neck stiffness.  Skin: Positive for rash.  Neurological: Negative for syncope and headaches.      Allergies  Review of patient's allergies indicates no known allergies.  Home Medications   Prior to Admission medications   Medication Sig Start Date End Date Taking? Authorizing Provider  ALPRAZolam Duanne Moron) 1 MG tablet Take 1 mg by mouth daily as needed for anxiety or sleep.     Historical Provider, MD  amiodarone (PACERONE) 200 MG tablet Take 200 mg by mouth daily.    Historical Provider, MD  aspirin EC 81 MG tablet Take 81 mg by mouth at bedtime.    Historical Provider, MD  atorvastatin (LIPITOR) 40 MG tablet Take 40 mg by mouth daily.    Historical Provider, MD  clindamycin (CLEOCIN) 300 MG capsule Take 1 capsule (300 mg total) by mouth 3 (three)  times daily. 01/05/15   Alfonzo Beers, MD  digoxin (LANOXIN) 0.125 MG tablet Take 0.125 mg by mouth daily.     Historical Provider, MD  levothyroxine (SYNTHROID, LEVOTHROID) 112 MCG tablet Take 112 mcg by mouth daily before breakfast.    Historical Provider, MD  magnesium oxide (MAG-OX) 400 MG tablet Take 400 mg by mouth daily.    Historical Provider, MD  metoprolol (LOPRESSOR) 50 MG tablet TAKE 1 TABLET BY MOUTH TWICE DAILY. 10/23/14   Lorretta Harp, MD  niacin (NIASPAN) 1000 MG CR tablet TAKE (1) TABLET BY MOUTH AT BEDTIME. 08/23/14   Lorretta Harp, MD  nitroGLYCERIN  (NITROSTAT) 0.4 MG SL tablet Place 0.4 mg under the tongue every 5 (five) minutes as needed for chest pain.    Historical Provider, MD  oxyCODONE-acetaminophen (PERCOCET) 10-325 MG per tablet Take 1 tablet by mouth every 4 (four) hours as needed for pain.    Historical Provider, MD  pioglitazone (ACTOS) 45 MG tablet Take 45 mg by mouth daily.    Historical Provider, MD  PRADAXA 75 MG CAPS capsule TAKE 1 CAPSULE BY MOUTH EVERY 12 HOURS. 07/12/14   Troy Sine, MD  ramipril (ALTACE) 2.5 MG capsule Take 2.5 mg by mouth at bedtime.     Historical Provider, MD  ZETIA 10 MG tablet TAKE 1 TABLET ONCE DAILY FOR CHOLESTEROL. 08/23/14   Lorretta Harp, MD  zolpidem (AMBIEN) 10 MG tablet Take 10 mg by mouth at bedtime as needed for sleep.    Historical Provider, MD   BP 112/44 mmHg  Pulse 50  Temp(Src) 97.5 F (36.4 C) (Oral)  Resp 16  Ht 6\' 2"  (1.88 m)  Wt 180 lb (81.647 kg)  BMI 23.10 kg/m2  SpO2 97% Physical Exam  Constitutional: He is oriented to person, place, and time. He appears well-developed and well-nourished. No distress.  HENT:  Head: Normocephalic and atraumatic.  Mouth/Throat: No oropharyngeal exudate.  Eyes: Conjunctivae and EOM are normal.  Neck: Normal range of motion.  Cardiovascular: Normal rate, regular rhythm, normal heart sounds and intact distal pulses.  Exam reveals no gallop and no friction rub.   No murmur heard. Pulmonary/Chest: Effort normal and breath sounds normal. No respiratory distress. He has no wheezes. He has no rales.  Abdominal: Soft. He exhibits no distension. There is no tenderness. There is no guarding.  Musculoskeletal: He exhibits edema (2+ bilaterally, worse on right).  Neurological: He is alert and oriented to person, place, and time.  Skin: Skin is warm and dry. Rash (deep erythematous patches, nonblanching RLE, warmth, no fluctuance, no vesicles, no significant tenderness) noted. He is not diaphoretic.  Nursing note and vitals reviewed.   ED  Course  Procedures (including critical care time) Labs Review Labs Reviewed  CBC WITH DIFFERENTIAL/PLATELET - Abnormal; Notable for the following:    RBC 2.95 (*)    Hemoglobin 9.7 (*)    HCT 30.3 (*)    MCV 102.7 (*)    RDW 16.1 (*)    All other components within normal limits  COMPREHENSIVE METABOLIC PANEL - Abnormal; Notable for the following:    Glucose, Bld 105 (*)    BUN 22 (*)    Creatinine, Ser 1.49 (*)    Calcium 8.6 (*)    Albumin 3.1 (*)    AST 51 (*)    GFR calc non Af Amer 46 (*)    GFR calc Af Amer 53 (*)    All other components within normal limits  CULTURE, BLOOD (ROUTINE X 2)  CULTURE, BLOOD (ROUTINE X 2)  I-STAT CG4 LACTIC ACID, ED    Imaging Review No results found. I have personally reviewed and evaluated these images and lab results as part of my medical decision-making.   EKG Interpretation None      MDM   Final diagnoses:  Bacteremia  Cellulitis of right lower extremity    69 year old male with a history of coronary artery disease disease status post CABG, stenting, ischemic cardiomyopathy, atrial fibrillation, ICD in place, DVT on Eliquis, diabetes, hypertension, hyperlipidemia, presents with concern of right lower extremity cellulitis with positive blood culture from 2 days ago. Patient had presented with concern of right lower extremity erythema 2 days ago, with temperature to 100.7 and rigors, was evaluated with blood cultures, given vancomycin and Zosyn, then discharged on clindamycin given normalized vital signs and otherwise benign appearance. Since patient was started on oral antibiotics he reported improvement of his cellulitis as well as his systemic symptoms, however today received a call that 1 out of 2 of his blood cultures had been positive.  The culture shows gram-positive cocci in clusters in 1 out of 2 cultures. Labs were obtained and patient was initiated on IV vancomycin. The hospitalist service was consulted for  admission.    Gareth Morgan, MD 01/07/15 2312

## 2015-01-07 NOTE — ED Notes (Signed)
Pt was seen here on Saturday for cellutitis; pt then had blood cultures drawn and today the lab called to let pt know he had positive blood cultures

## 2015-01-07 NOTE — Telephone Encounter (Signed)
Chart reviewed by Harlene Ramus PA "Call patient and advise them to come to the ED for admission for IV antibiotics" Called patients (M) # and his wife answered the phone stating "he was sick in bed".   Patients wife was informed patients blood showed growth and per PA (N. Nadeau) advised her to bring patient back to ED for admission for IV antibiotics.  Wife was very argumentative stating MD was going to admit then changed their mind and sent home on oral antibiotics and he did receive 2 bags of IV antibiotics while in the ED.  Informed her blood cx results weren't back when he was in ED and she wanted to know why we didn't tell her that not everything was back.  Current Probation officer again read [employer] to wife and she said she would tell pt but he probably wouldn't come back until tomorrow.

## 2015-01-07 NOTE — Telephone Encounter (Signed)
Call from [facility] lab w/prelim. Blood Cx result.  Pt had 2 sets done Anaerobic bottle of 1 set is growing gram (+) cocci in clusters.  Chart to MD for review.

## 2015-01-07 NOTE — H&P (Signed)
PCP:   Glo Herring., MD   Chief Complaint:  Positive blood culture  HPI: 69 year old male who   has a past medical history of Ischemic cardiomyopathy; CAD (coronary artery disease); S/P CABG x 5; PAF (paroxysmal atrial fibrillation) ([place]); Hypertension; Hyperlipidemia; RBBB; ICD (implantable cardioverter-defibrillator), single, in situ; DM (diabetes mellitus) ([place]); and DVT (deep venous thrombosis) ([place]). Patient was seen in the ED is ago at that time there was a concern for right lower extremity cellulitis, patient was given a dose of vancomycin and Zosyn and then sent home on clindamycin. At that time blood cultures were drawn. Patient was doing fine at home when lab called and told that one out of 2 bottles of blood cultures was positive for gram-positive cocci in clusters and patient was instructed to come back to the ED. Patient says that he has been feeling better though he still feels pain in the leg while walking. He denies any fever, no chest pain or shortness of breath. No nausea vomiting or diarrhea. Patient has been seen by oncology for abnormal serum protein electrophoresis.  Allergies:  No Known Allergies    Past Medical History  Diagnosis Date  . Ischemic cardiomyopathy   . CAD (coronary artery disease)   . S/P CABG x 5   . PAF (paroxysmal atrial fibrillation) ([place])   . Hypertension   . Hyperlipidemia   . RBBB   . ICD (implantable cardioverter-defibrillator), single, in situ   . DM (diabetes mellitus) ([place])     type 2   . DVT (deep venous thrombosis) [facility])     Past Surgical History  Procedure Laterality Date  . Coronary artery bypass graft  12/31/1995    LIMA to LAD,SVG to intermediate,SVG to CX,seq. svg to posterior descending and posterolateral RCA  . Cardiac catheterization  05/08/2009    small vessel disease  . Cardiac defibrillator placement  05/09/2009    [employer]  . Back surgery    . I&d extremity Left 11/03/2013    Procedure: Left Leg Debride  Ulcer, Apply Wound VAC and Theraskin;  Surgeon: Newt Minion, MD;  Location: [place];  Service: Orthopedics;  Laterality: Left;    Prior to Admission medications   Medication Sig Start Date End Date Taking? Authorizing Provider  ALPRAZolam Duanne Moron) 1 MG tablet Take 1 mg by mouth daily as needed for anxiety or sleep.    Yes Historical Provider, MD  amiodarone (PACERONE) 200 MG tablet Take 200 mg by mouth daily.   Yes Historical Provider, MD  aspirin EC 81 MG tablet Take 81 mg by mouth at bedtime.   Yes Historical Provider, MD  atorvastatin (LIPITOR) 40 MG tablet Take 40 mg by mouth daily.   Yes Historical Provider, MD  clindamycin (CLEOCIN) 300 MG capsule Take 1 capsule (300 mg total) by mouth 3 (three) times daily. 01/05/15  Yes Alfonzo Beers, MD  digoxin (LANOXIN) 0.125 MG tablet Take 0.125 mg by mouth daily.    Yes Historical Provider, MD  levothyroxine (SYNTHROID, LEVOTHROID) 112 MCG tablet Take 112 mcg by mouth daily before breakfast.   Yes Historical Provider, MD  magnesium oxide (MAG-OX) 400 MG tablet Take 400 mg by mouth daily.   Yes Historical Provider, MD  metoprolol (LOPRESSOR) 50 MG tablet TAKE 1 TABLET BY MOUTH TWICE DAILY. 10/23/14  Yes Lorretta Harp, MD  niacin (NIASPAN) 1000 MG CR tablet TAKE (1) TABLET BY MOUTH AT BEDTIME. 08/23/14  Yes Lorretta Harp, MD  nitroGLYCERIN (NITROSTAT) 0.4 MG SL tablet Place  0.4 mg under the tongue every 5 (five) minutes as needed for chest pain.   Yes Historical Provider, MD  oxyCODONE-acetaminophen (PERCOCET) 10-325 MG per tablet Take 1 tablet by mouth every 4 (four) hours as needed for pain.   Yes Historical Provider, MD  pioglitazone (ACTOS) 45 MG tablet Take 45 mg by mouth daily.   Yes Historical Provider, MD  PRADAXA 75 MG CAPS capsule TAKE 1 CAPSULE BY MOUTH EVERY 12 HOURS. 07/12/14  Yes Troy Sine, MD  ramipril (ALTACE) 2.5 MG capsule Take 2.5 mg by mouth at bedtime.    Yes Historical Provider, MD  ZETIA 10 MG tablet TAKE 1 TABLET ONCE DAILY  FOR CHOLESTEROL. 08/23/14  Yes Lorretta Harp, MD  zolpidem (AMBIEN) 10 MG tablet Take 10 mg by mouth at bedtime as needed for sleep.   Yes Historical Provider, MD    Social History:  reports that he quit smoking about 19 years ago. His smoking use included Cigarettes. He has never used smokeless tobacco. He reports that he does not drink alcohol or use illicit drugs.  Family History  Problem Relation Age of Onset  . Heart attack Mother   . Stroke Father     Alan Orr Weights   01/07/15 2114  Weight: 81.647 kg (180 lb)    All the positives are listed in BOLD  Review of Systems:  HEENT: Headache, blurred vision, runny nose, sore throat Neck: Hypothyroidism, hyperthyroidism,,lymphadenopathy Chest : Shortness of breath, history of COPD, Asthma Heart : Chest pain, history of coronary arterey disease GI:  Nausea, vomiting, diarrhea, constipation, GERD GU: Dysuria, urgency, frequency of urination, hematuria Neuro: Stroke, seizures, syncope Psych: Depression, anxiety, hallucinations   Physical Exam: Blood pressure 112/44, pulse 50, temperature 97.5 F (36.4 C), temperature source Oral, resp. rate 16, height 6' 2"  (1.88 m), weight 81.647 kg (180 lb), SpO2 97 %. Constitutional:   Patient is a well-developed and well-nourished * male in no acute distress and cooperative with exam. Head: Normocephalic and atraumatic Mouth: Mucus membranes moist Eyes: PERRL, EOMI, conjunctivae normal Neck: Supple, No Thyromegaly Cardiovascular: RRR, S1 normal, S2 normal Pulmonary/Chest: CTAB, no wheezes, rales, or rhonchi Abdominal: Soft. Non-tender, non-distended, bowel sounds are normal, no masses, organomegaly, or guarding present.  Neurological: A&O x3, Strength is normal and symmetric bilaterally, cranial nerve II-XII are grossly intact, no focal motor deficit, sensory intact to light touch bilaterally.  Extremities : Erythema noted in the right lower extremity   Labs on Admission:  Basic Metabolic  Panel:  Recent Labs Lab 01/05/15 1057 01/07/15 2129  NA 138 137  K 4.1 4.2  CL 102 101  CO2 29 29  GLUCOSE 98 105*  BUN 26* 22*  CREATININE 1.61* 1.49*  CALCIUM 8.6* 8.6*   Liver Function Tests:  Recent Labs Lab 01/07/15 2129  AST 51*  ALT 37  ALKPHOS 76  BILITOT 0.8  PROT 6.5  ALBUMIN 3.1*   CBC:  Recent Labs Lab 01/05/15 1057 01/07/15 2129  WBC 10.2 7.5  NEUTROABS  --  5.0  HGB 9.8* 9.7*  HCT 30.5* 30.3*  MCV 103.0* 102.7*  PLT 137* 182       Assessment/Plan Active Problems:   Biventricular ICD (implantable cardioverter-defibrillator) in place   CAD s/p CABG    Paroxysmal atrial fibrillation (HCC)   Abnormal serum protein electrophoresis   Positive blood culture   Gram-positive bacteremia  Gram-positive bacteremia  One out of 2 bottles growing gram-positive cocci in clusters from blood work done on 01/05/2015  We'll start the patient on vancomycin per pharmacy consultation.  If coagulase negative  staph growing, patient can be discharged home on by mouth antibiotics.   Right lower extremity cellulitis Improving, continue vancomycin. Will discontinue clindamycin at this time  Diabetes mellitus Hold Actos, start sliding scale insulin with NovoLog.  CAD status post CABG Stable, continue aspirin, metoprolol.  Paroxysmal atrial fibrillation Continue Pradaxa, metoprolol  Abnormal serum protein electrophoresis Patient to follow-up in oncology for bone marrow biopsy   Code status: Full code  Family discussion: Admission, patients condition and plan of care including tests being ordered have been discussed with the patient and his wife at bedside* who indicate understanding and agree with the plan and Code Status.   Time Spent on Admission: 60 min  [place] [facility] Pager: [phone] 01/07/2015, 11:38 PM  If 7PM-7AM, please contact night-coverage  [email or web address]  Password [email or web address]

## 2015-01-08 DIAGNOSIS — E785 Hyperlipidemia, unspecified: Secondary | ICD-10-CM

## 2015-01-08 DIAGNOSIS — E1122 Type 2 diabetes mellitus with diabetic chronic kidney disease: Secondary | ICD-10-CM

## 2015-01-08 DIAGNOSIS — I255 Ischemic cardiomyopathy: Secondary | ICD-10-CM | POA: Diagnosis not present

## 2015-01-08 DIAGNOSIS — I129 Hypertensive chronic kidney disease with stage 1 through stage 4 chronic kidney disease, or unspecified chronic kidney disease: Secondary | ICD-10-CM | POA: Diagnosis present

## 2015-01-08 DIAGNOSIS — Z7982 Long term (current) use of aspirin: Secondary | ICD-10-CM | POA: Diagnosis not present

## 2015-01-08 DIAGNOSIS — Z7902 Long term (current) use of antithrombotics/antiplatelets: Secondary | ICD-10-CM | POA: Diagnosis not present

## 2015-01-08 DIAGNOSIS — N183 Chronic kidney disease, stage 3 (moderate): Secondary | ICD-10-CM

## 2015-01-08 DIAGNOSIS — L03115 Cellulitis of right lower limb: Principal | ICD-10-CM

## 2015-01-08 DIAGNOSIS — Z951 Presence of aortocoronary bypass graft: Secondary | ICD-10-CM | POA: Diagnosis not present

## 2015-01-08 DIAGNOSIS — R509 Fever, unspecified: Secondary | ICD-10-CM | POA: Diagnosis not present

## 2015-01-08 DIAGNOSIS — B9561 Methicillin susceptible Staphylococcus aureus infection as the cause of diseases classified elsewhere: Secondary | ICD-10-CM | POA: Diagnosis present

## 2015-01-08 DIAGNOSIS — E039 Hypothyroidism, unspecified: Secondary | ICD-10-CM | POA: Diagnosis present

## 2015-01-08 DIAGNOSIS — D649 Anemia, unspecified: Secondary | ICD-10-CM | POA: Diagnosis present

## 2015-01-08 DIAGNOSIS — Z452 Encounter for adjustment and management of vascular access device: Secondary | ICD-10-CM | POA: Diagnosis not present

## 2015-01-08 DIAGNOSIS — I480 Paroxysmal atrial fibrillation: Secondary | ICD-10-CM | POA: Diagnosis not present

## 2015-01-08 DIAGNOSIS — Z95810 Presence of automatic (implantable) cardiac defibrillator: Secondary | ICD-10-CM | POA: Diagnosis not present

## 2015-01-08 DIAGNOSIS — Z87891 Personal history of nicotine dependence: Secondary | ICD-10-CM | POA: Diagnosis not present

## 2015-01-08 DIAGNOSIS — I2510 Atherosclerotic heart disease of native coronary artery without angina pectoris: Secondary | ICD-10-CM | POA: Diagnosis present

## 2015-01-08 DIAGNOSIS — R769 Abnormal immunological finding in serum, unspecified: Secondary | ICD-10-CM | POA: Diagnosis not present

## 2015-01-08 DIAGNOSIS — E1121 Type 2 diabetes mellitus with diabetic nephropathy: Secondary | ICD-10-CM

## 2015-01-08 DIAGNOSIS — Z86718 Personal history of other venous thrombosis and embolism: Secondary | ICD-10-CM | POA: Diagnosis not present

## 2015-01-08 DIAGNOSIS — R7881 Bacteremia: Secondary | ICD-10-CM | POA: Diagnosis not present

## 2015-01-08 DIAGNOSIS — A499 Bacterial infection, unspecified: Secondary | ICD-10-CM | POA: Diagnosis not present

## 2015-01-08 LAB — GLUCOSE, CAPILLARY
GLUCOSE-CAPILLARY: 107 mg/dL — AB (ref 65–99)
Glucose-Capillary: 82 mg/dL (ref 65–99)
Glucose-Capillary: 76 mg/dL (ref 65–99)
Glucose-Capillary: 98 mg/dL (ref 65–99)
Glucose-Capillary: 126 mg/dL — ABNORMAL HIGH (ref 65–99)

## 2015-01-08 LAB — CBC
HCT: 27.4 % — ABNORMAL LOW (ref 39.0–52.0)
HEMOGLOBIN: 8.8 g/dL — AB (ref 13.0–17.0)
MCH: 32.7 pg (ref 26.0–34.0)
MCHC: 32.1 g/dL (ref 30.0–36.0)
MCV: 101.9 fL — ABNORMAL HIGH (ref 78.0–100.0)
Platelets: 156 10*3/uL (ref 150–400)
RBC: 2.69 MIL/uL — AB (ref 4.22–5.81)
RDW: 16 % — ABNORMAL HIGH (ref 11.5–15.5)
WBC: 5.7 10*3/uL (ref 4.0–10.5)

## 2015-01-08 LAB — COMPREHENSIVE METABOLIC PANEL
ALBUMIN: 2.6 g/dL — AB (ref 3.5–5.0)
ALK PHOS: 69 U/L (ref 38–126)
ALT: 32 U/L (ref 17–63)
AST: 41 U/L (ref 15–41)
Anion gap: 6 (ref 5–15)
BUN: 19 mg/dL (ref 6–20)
CALCIUM: 8.3 mg/dL — AB (ref 8.9–10.3)
CO2: 29 mmol/L (ref 22–32)
CREATININE: 1.36 mg/dL — AB (ref 0.61–1.24)
Chloride: 102 mmol/L (ref 101–111)
GFR calc non Af Amer: 51 mL/min — ABNORMAL LOW (ref >60)
GFR, EST AFRICAN AMERICAN: 59 mL/min — AB (ref >60)
GLUCOSE: 77 mg/dL (ref 65–99)
Potassium: 4.3 mmol/L (ref 3.5–5.1)
SODIUM: 137 mmol/L (ref 135–145)
Total Bilirubin: 0.7 mg/dL (ref 0.3–1.2)
Total Protein: 5.5 g/dL — ABNORMAL LOW (ref 6.5–8.1)

## 2015-01-08 MED ORDER — METOPROLOL TARTRATE 50 MG PO TABS
50.0000 mg | ORAL_TABLET | Freq: Two times a day (BID) | ORAL | Status: DC
  Administered 2015-01-08 – 2015-01-12 (×8): 50 mg via ORAL
  Filled 2015-01-08 (×10): qty 1

## 2015-01-08 MED ORDER — NIACIN ER 250 MG PO CPCR
ORAL_CAPSULE | ORAL | Status: AC
  Filled 2015-01-08: qty 4

## 2015-01-08 MED ORDER — DABIGATRAN ETEXILATE MESYLATE 75 MG PO CAPS
75.0000 mg | ORAL_CAPSULE | Freq: Two times a day (BID) | ORAL | Status: DC
  Administered 2015-01-08 – 2015-01-10 (×6): 75 mg via ORAL
  Filled 2015-01-08 (×9): qty 1

## 2015-01-08 MED ORDER — LEVOTHYROXINE SODIUM 112 MCG PO TABS
112.0000 ug | ORAL_TABLET | Freq: Every day | ORAL | Status: DC
  Administered 2015-01-08 – 2015-01-12 (×4): 112 ug via ORAL
  Filled 2015-01-08 (×5): qty 1

## 2015-01-08 MED ORDER — ATORVASTATIN CALCIUM 40 MG PO TABS
40.0000 mg | ORAL_TABLET | Freq: Every day | ORAL | Status: DC
  Administered 2015-01-08 – 2015-01-12 (×5): 40 mg via ORAL
  Filled 2015-01-08 (×6): qty 1

## 2015-01-08 MED ORDER — DIGOXIN 125 MCG PO TABS
0.1250 mg | ORAL_TABLET | Freq: Every day | ORAL | Status: DC
  Administered 2015-01-10 – 2015-01-12 (×2): 0.125 mg via ORAL
  Filled 2015-01-08 (×5): qty 1

## 2015-01-08 MED ORDER — RAMIPRIL 1.25 MG PO CAPS
2.5000 mg | ORAL_CAPSULE | Freq: Every day | ORAL | Status: DC
  Administered 2015-01-08 – 2015-01-11 (×5): 2.5 mg via ORAL
  Filled 2015-01-08 (×5): qty 2

## 2015-01-08 MED ORDER — EZETIMIBE 10 MG PO TABS
10.0000 mg | ORAL_TABLET | Freq: Every day | ORAL | Status: DC
Start: 2015-01-08 — End: 2015-01-12
  Administered 2015-01-08 – 2015-01-12 (×5): 10 mg via ORAL
  Filled 2015-01-08 (×6): qty 1

## 2015-01-08 MED ORDER — ALPRAZOLAM 1 MG PO TABS
1.0000 mg | ORAL_TABLET | Freq: Every day | ORAL | Status: DC | PRN
  Administered 2015-01-10: 1 mg via ORAL
  Filled 2015-01-08: qty 1

## 2015-01-08 MED ORDER — ONDANSETRON HCL 4 MG PO TABS: 4.0000 mg | ORAL_TABLET | Freq: Four times a day (QID) | ORAL | Status: DC | PRN

## 2015-01-08 MED ORDER — DABIGATRAN ETEXILATE MESYLATE 75 MG PO CAPS
ORAL_CAPSULE | ORAL | Status: AC
  Filled 2015-01-08: qty 1

## 2015-01-08 MED ORDER — NIACIN ER (ANTIHYPERLIPIDEMIC) 500 MG PO TBCR
1000.0000 mg | EXTENDED_RELEASE_TABLET | Freq: Every day | ORAL | Status: DC
  Administered 2015-01-08 – 2015-01-11 (×5): 1000 mg via ORAL
  Filled 2015-01-08 (×7): qty 2

## 2015-01-08 MED ORDER — AMIODARONE HCL 200 MG PO TABS
200.0000 mg | ORAL_TABLET | Freq: Every day | ORAL | Status: DC
  Administered 2015-01-08 – 2015-01-12 (×5): 200 mg via ORAL
  Filled 2015-01-08 (×6): qty 1

## 2015-01-08 MED ORDER — INSULIN ASPART 100 UNIT/ML ~~LOC~~ SOLN: 0.0000 [IU] | Freq: Three times a day (TID) | SUBCUTANEOUS | Status: DC

## 2015-01-08 MED ORDER — SODIUM CHLORIDE 0.9 % IV SOLN
INTRAVENOUS | Status: DC
  Administered 2015-01-08: 03:00:00 via INTRAVENOUS

## 2015-01-08 MED ORDER — ZOLPIDEM TARTRATE 5 MG PO TABS: 5.0000 mg | ORAL_TABLET | Freq: Every evening | ORAL | Status: DC | PRN

## 2015-01-08 MED ORDER — VANCOMYCIN HCL IN DEXTROSE 750-5 MG/150ML-% IV SOLN
750.0000 mg | Freq: Two times a day (BID) | INTRAVENOUS | Status: DC
  Administered 2015-01-08 – 2015-01-10 (×5): 750 mg via INTRAVENOUS
  Filled 2015-01-08 (×7): qty 150

## 2015-01-08 MED ORDER — ONDANSETRON HCL 4 MG/2ML IJ SOLN: 4.0000 mg | Freq: Four times a day (QID) | INTRAMUSCULAR | Status: DC | PRN

## 2015-01-08 MED ORDER — OXYCODONE-ACETAMINOPHEN 5-325 MG PO TABS: ORAL_TABLET | ORAL | Status: DC | PRN

## 2015-01-08 MED ORDER — ASPIRIN EC 81 MG PO TBEC
81.0000 mg | DELAYED_RELEASE_TABLET | Freq: Every day | ORAL | Status: DC
  Administered 2015-01-08 – 2015-01-11 (×5): 81 mg via ORAL
  Filled 2015-01-08 (×5): qty 1

## 2015-01-08 NOTE — Care Management Note (Signed)
Case Management Note  Patient Details  Name: Alan Orr MRN: [id number] Date of Birth: 09/27/1945  Subjective/Objective:                  Pt is from home, lives with his wife and is ind with ADL's. Pt uses a cane PRN. Has no HH services or med needs prior to admission.  Action/Plan: Pt plans to return home with self care. No CM needs anticipated.   Expected Discharge Date:  01/10/15               Expected Discharge Plan:  Home/Self Care  In-House Referral:  NA  Discharge planning Services  CM Consult  Post Acute Care Choice:  NA Choice offered to:  NA  DME Arranged:    DME Agency:     HH Arranged:    HH Agency:     Status of Service:  Completed, signed off  Medicare Important Message Given:    Date Medicare IM Given:    Medicare IM give by:    Date Additional Medicare IM Given:    Additional Medicare Important Message give by:     If discussed at [place] of Stay Meetings, dates discussed:    Additional Comments:  Sherald Barge, RN 01/08/2015, 10:34 AM

## 2015-01-08 NOTE — Progress Notes (Signed)
ANTIBIOTIC CONSULT NOTE - INITIAL  Pharmacy Consult for Vancomycin Indication: bacteremia  No Known Allergies  Patient Measurements: Height: 6\' 2"  (188 cm) Weight: 175 lb 3.2 oz (79.47 kg) IBW/kg (Calculated) : 82.2  Vital Signs: Temp: 98.5 F (36.9 C) (10/25 0519) Temp Source: Oral (10/25 0519) BP: 132/61 mmHg (10/25 0519) Pulse Rate: 60 (10/25 0519) Intake/Output from previous day: 10/24 0701 - 10/25 0700 In: -  Out: 400 [Urine:400] Intake/Output from this shift: Total I/O In: 480 [P.O.:480] Out: 650 [Urine:650]  Labs:  Recent Labs  01/07/15 2129 01/08/15 0534  WBC 7.5 5.7  HGB 9.7* 8.8*  PLT 182 156  CREATININE 1.49* 1.36*   Estimated Creatinine Clearance: 56.8 mL/min (by C-G formula based on Cr of 1.36). No results for input(s): VANCOTROUGH, VANCOPEAK, VANCORANDOM, GENTTROUGH, GENTPEAK, GENTRANDOM, TOBRATROUGH, TOBRAPEAK, TOBRARND, AMIKACINPEAK, AMIKACINTROU, AMIKACIN in the last 72 hours.   Microbiology: Recent Results (from the past 720 hour(s))  Blood culture (routine x 2)     Status: None (Preliminary result)   Collection Time: 01/05/15 10:57 AM  Result Value Ref Range Status   Specimen Description LEFT ANTECUBITAL  Final   Special Requests   Final    BOTTLES DRAWN AEROBIC AND ANAEROBIC AEB 6CC ANA 4CC   Culture NO GROWTH 2 DAYS  Final   Report Status PENDING  Incomplete  Blood culture (routine x 2)     Status: None (Preliminary result)   Collection Time: 01/05/15 11:11 AM  Result Value Ref Range Status   Specimen Description BLOOD RIGHT ANTECUBITAL  Final   Special Requests   Final    BOTTLES DRAWN AEROBIC AND ANAEROBIC AEB=12CC ANA=10CC   Culture  Setup Time   Final    GRAM POSITIVE COCCI IN CLUSTERS RECOVERED FROM THE ANAEROBIC BOTTLE Gram Stain Report Called to,Read Back By and Verified With: KXFGH,W., FLOW MANAGER, AT [place] ON 01/07/2015 BY BAUGHAM,M. Performed at [place] Performed at [facility]    Report Status PENDING  Incomplete  Urine culture     Status: None   Collection Time: 01/05/15 12:45 PM  Result Value Ref Range Status   Specimen Description URINE, CLEAN CATCH  Final   Special Requests NONE  Final   Culture   Final    1,000 COLONIES/mL INSIGNIFICANT GROWTH Performed at [facility]    Report Status 01/07/2015 FINAL  Final  Blood culture (routine x 2)     Status: None (Preliminary result)   Collection Time: 01/07/15  9:29 PM  Result Value Ref Range Status   Specimen Description LEFT ANTECUBITAL  Final   Special Requests BOTTLES DRAWN AEROBIC AND ANAEROBIC 6CC  Final   Culture PENDING  Incomplete   Report Status PENDING  Incomplete  Blood culture (routine x 2)     Status: None (Preliminary result)   Collection Time: 01/07/15  9:45 PM  Result Value Ref Range Status   Specimen Description RIGHT ANTECUBITAL  Final   Special Requests BOTTLES DRAWN AEROBIC AND ANAEROBIC 6CC  Final   Culture PENDING  Incomplete   Report Status PENDING  Incomplete   Medical History: Past Medical History  Diagnosis Date  . Ischemic cardiomyopathy   . CAD (coronary artery disease)   . S/P CABG x 5   . PAF (paroxysmal atrial fibrillation) ([place])   . Hypertension   . Hyperlipidemia   . RBBB   . ICD (implantable cardioverter-defibrillator), single, in situ   .  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 DVT (deep venous thrombosis) ([facility])    Anti-infectives    Start     Dose/Rate Route Frequency Ordered Stop   01/08/15 1000  vancomycin (VANCOCIN) IVPB 750 mg/150 ml premix     750 mg 150 mL/hr over 60 Minutes Intravenous Every 12 hours 01/08/15 0741     01/07/15 2130  vancomycin (VANCOCIN) 1,500 mg in sodium chloride 0.9 % 500 mL IVPB     1,500 mg 250 mL/hr over 120 Minutes Intravenous  Once 01/07/15 2124 01/08/15 0041     Assessment: Patient was seen in the ED is ago at that time there was a concern for right lower extremity cellulitis, patient was given  a dose of vancomycin and Zosyn and then sent home on clindamycin. At that time blood cultures were drawn. Patient was doing fine at home when lab called and told that one out of 2 bottles of blood cultures was positive for gram-positive cocci in clusters and patient was instructed to come back to the ED. Patient says that he has been feeling better though he still feels pain in the leg while walking.  Goal of Therapy:  Vancomycin trough level 15-20 mcg/ml  Plan:  Vancomycin 750mg  IV q12hrs Check trough at steady state Monitor labs, renal fxn, and c/s Duration of therapy per MD  Hart Robinsons A 01/08/2015,11:02 AM

## 2015-01-08 NOTE — Progress Notes (Signed)
Pt arrived on floor around 0200 with wife.  He was made comfortable and meds were given.  He is now quietly resting.

## 2015-01-08 NOTE — Progress Notes (Signed)
[facility] PROGRESS NOTE  Alan Orr MAY:[id number] DOB: [birth year] DOA: 01/07/2015 PCP: Glo Herring., MD  Assessment/Plan: Gram-positive bacteremia :1/2 bottles growing gram-positive cocci in clusters from blood work done on 01/05/2015  -patient on vancomycin now -will follow culture speciation and sensitivity -depending results will need echo and IV therapy for 4-6 weeks of ABX's  -If coagulase negative staph growing, patient can be discharged home on by PO antibiotics for his cellulitis (resumption of cleocin).   Right lower extremity cellulitis: recently diagnose -Improving, was on cleocin as an outpatient for 2 days -now covered with vancomycin. -plan was to treat for 10 days total  Diabetes mellitus -will hold oral hypoglycemic agents while inpatient -continue SSI  CAD status post CABG -Stable and w/o CP or SOB -Continue aspirin, metoprolol, statins.  Paroxysmal atrial fibrillation -Continue Pradaxa, metoprolol, amiodarone and digoxin -Rate is well controlled -CHADsVASC score 4  Abnormal serum protein electrophoresis -Patient to follow-up with oncology service for bone marrow biopsy and further workup   HLD: -continue niacin and lipitor  Hypothyroidism -continue synthroid   CKD stage 3 -stable and with Cr at baseline -will monitor trend -pharmacy on board adjusting medications for his renal function   Code Status: Full Family Communication: no family at bedside  Disposition Plan: follow blood culture results; continue meanwhile IV antibiotics; hopefully home in 24-48 hours    Consultants:  None   Procedures:  None   Antibiotics:  Vancomycin 10/24  Was on clindamycin prior to admission (10/22>>10/24) due to RLE cellulitis  HPI/Subjective: Afebrile, and in no distress. Denies CP, SOB, abd pain, nausea, vomiting or any other acute complaints   Objective: Filed Vitals:   01/08/15 1358  BP: 111/43  Pulse: 50  Temp: 97.7 F  (36.5 C)  Resp: 18    Intake/Output Summary (Last 24 hours) at 01/08/15 1425 Last data filed at 01/08/15 1245  Gross per 24 hour  Intake    720 ml  Output   1300 ml  Net   -580 ml   Filed Weights   01/07/15 2114 01/08/15 0112  Weight: 81.647 kg (180 lb) 79.47 kg (175 lb 3.2 oz)    Exam:   General:  Afebrile, w/o complaints and in no distress. RLE cellulitis continue improving   Cardiovascular: S1 and S2, no rubs or gallops   Respiratory: CTA bilaterally  Abdomen: soft, NT, ND, positive BS  Musculoskeletal: RLE redness and mild swelling appreciated; improved in conparisson to reports from 10/22. No cyanosis   Data Reviewed: Basic Metabolic Panel:  Recent Labs Lab 01/05/15 1057 01/07/15 2129 01/08/15 0534  NA 138 137 137  K 4.1 4.2 4.3  CL 102 101 102  CO2 _0 GLUCOSE 98 105* 77  BUN 26* 22* 19  CREATININE 1.61* 1.49* 1.36*  CALCIUM 8.6* 8.6* 8.3*   Liver Function Tests:  Recent Labs Lab 01/07/15 2129 01/08/15 0534  AST 51* 41  ALT 37 32  ALKPHOS 76 69  BILITOT 0.8 0.7  PROT 6.5 5.5*  ALBUMIN 3.1* 2.6*   CBC:  Recent Labs Lab 01/05/15 1057 01/07/15 2129 01/08/15 0534  WBC 10.2 7.5 5.7  NEUTROABS  --  5.0  --   HGB 9.8* 9.7* 8.8*  HCT 30.5* 30.3* 27.4*  MCV 103.0* 102.7* 101.9*  PLT 137* 182 156   CBG:  Recent Labs Lab 01/08/15 0234 01/08/15 0731 01/08/15 1119  GLUCAP 82 76 107*    Recent Results (from the past 240 hour(s))  Blood culture (routine x  2)     Status: None (Preliminary result)   Collection Time: 01/05/15 10:57 AM  Result Value Ref Range Status   Specimen Description LEFT ANTECUBITAL  Final   Special Requests   Final    BOTTLES DRAWN AEROBIC AND ANAEROBIC AEB 6CC ANA 4CC   Culture NO GROWTH 2 DAYS  Final   Report Status PENDING  Incomplete  Blood culture (routine x 2)     Status: None (Preliminary result)   Collection Time: 01/05/15 11:11 AM  Result Value Ref Range Status   Specimen Description BLOOD RIGHT  ANTECUBITAL  Final   Special Requests   Final    BOTTLES DRAWN AEROBIC AND ANAEROBIC AEB=12CC ANA=10CC   Culture  Setup Time   Final    GRAM POSITIVE COCCI IN CLUSTERS RECOVERED FROM THE ANAEROBIC BOTTLE Gram Stain Report Called to,Read Back By and Verified With: OEUMP,N., FLOW MANAGER, AT [place] ON 01/07/2015 BY BAUGHAM,M. Performed at [place] Performed at [facility]    Report Status PENDING  Incomplete  Urine culture     Status: None   Collection Time: 01/05/15 12:45 PM  Result Value Ref Range Status   Specimen Description URINE, CLEAN CATCH  Final   Special Requests NONE  Final   Culture   Final    1,000 COLONIES/mL INSIGNIFICANT GROWTH Performed at [facility]    Report Status 01/07/2015 FINAL  Final  Blood culture (routine x 2)     Status: None (Preliminary result)   Collection Time: 01/07/15  9:29 PM  Result Value Ref Range Status   Specimen Description LEFT ANTECUBITAL  Final   Special Requests BOTTLES DRAWN AEROBIC AND ANAEROBIC 6CC  Final   Culture PENDING  Incomplete   Report Status PENDING  Incomplete  Blood culture (routine x 2)     Status: None (Preliminary result)   Collection Time: 01/07/15  9:45 PM  Result Value Ref Range Status   Specimen Description RIGHT ANTECUBITAL  Final   Special Requests BOTTLES DRAWN AEROBIC AND ANAEROBIC 6CC  Final   Culture PENDING  Incomplete   Report Status PENDING  Incomplete     Studies: No results found.  Scheduled Meds: . amiodarone  200 mg Oral Daily  . aspirin EC  81 mg Oral QHS  . atorvastatin  40 mg Oral Daily  . dabigatran  75 mg Oral Q12H  . digoxin  0.125 mg Oral Daily  . ezetimibe  10 mg Oral Daily  . insulin aspart  0-9 Units Subcutaneous TID WC  . levothyroxine  112 mcg Oral QAC breakfast  . metoprolol  50 mg Oral BID  . niacin  1,000 mg Oral QHS  . ramipril  2.5 mg Oral QHS  . vancomycin  750 mg Intravenous Q12H   Continuous  Infusions: . sodium chloride 10 mL/hr at 01/08/15 0245    Active Problems:   Biventricular ICD (implantable cardioverter-defibrillator) in place   CAD s/p CABG    Paroxysmal atrial fibrillation (HCC)   Abnormal serum protein electrophoresis   Positive blood culture   Gram-positive bacteremia    Time spent: 30 minutes    Barton Dubois  [facility] Pager [phone]. If 7PM-7AM, please contact night-coverage at [email or web address], password [facility] 01/08/2015, 2:25 PM  LOS: 1 day

## 2015-01-09 ENCOUNTER — Ambulatory Visit (HOSPITAL_COMMUNITY): Payer: Medicare Other | Admitting: Oncology

## 2015-01-09 DIAGNOSIS — A499 Bacterial infection, unspecified: Secondary | ICD-10-CM

## 2015-01-09 LAB — GLUCOSE, CAPILLARY
GLUCOSE-CAPILLARY: 83 mg/dL (ref 65–99)
GLUCOSE-CAPILLARY: 116 mg/dL — AB (ref 65–99)
GLUCOSE-CAPILLARY: 120 mg/dL — AB (ref 65–99)
Glucose-Capillary: 148 mg/dL — ABNORMAL HIGH (ref 65–99)

## 2015-01-09 LAB — HEMOGLOBIN A1C
HEMOGLOBIN A1C: 5.6 % (ref 4.8–5.6)
MEAN PLASMA GLUCOSE: 114 mg/dL

## 2015-01-09 NOTE — Progress Notes (Signed)
[facility] PROGRESS NOTE  Alan Orr:[id number] DOB: 06/07/1945    PCP:   Glo Herring., MD   HPI: Alan Orr is an 69 y.o. male admitted for 1/2 positive blood culture, gotten when he was seen in the ER for celllulitis of the right lower extremity, and was admitted and given IV Vancomycin.  He also had another set of BC done on admission that is still pending, but NGTD. The positive BC was staph aureus.  He has no leukocytosis, fever, or chills.  He has DM and has a Metronic ICD.   Rewiew of Systems:  Constitutional: Negative for malaise, fever and chills. No significant weight loss or weight gain Eyes: Negative for eye pain, redness and discharge, diplopia, visual changes, or flashes of light. ENMT: Negative for ear pain, hoarseness, nasal congestion, sinus pressure and sore throat. No headaches; tinnitus, drooling, or problem swallowing. Cardiovascular: Negative for chest pain, palpitations, diaphoresis, dyspnea and peripheral edema. ; No orthopnea, PND Respiratory: Negative for cough, hemoptysis, wheezing and stridor. No pleuritic chestpain. Gastrointestinal: Negative for nausea, vomiting, diarrhea, constipation, abdominal pain, melena, blood in stool, hematemesis, jaundice and rectal bleeding.    Genitourinary: Negative for frequency, dysuria, incontinence,flank pain and hematuria; Musculoskeletal: Negative for back pain and neck pain. Negative for swelling and trauma.;  Skin: . Negative for pruritus, rash, abrasions, bruising and skin lesion.; ulcerations Neuro: Negative for headache, lightheadedness and neck stiffness. Negative for weakness, altered level of consciousness , altered mental status, extremity weakness, burning feet, involuntary movement, seizure and syncope.  Psych: negative for anxiety, depression, insomnia, tearfulness, panic attacks, hallucinations, paranoia, suicidal or homicidal ideation    Past Medical History  Diagnosis Date  .  Ischemic cardiomyopathy   . CAD (coronary artery disease)   . S/P CABG x 5   . PAF (paroxysmal atrial fibrillation) ([place])   . Hypertension   . Hyperlipidemia   . RBBB   . ICD (implantable cardioverter-defibrillator), single, in situ   . DM (diabetes mellitus) ([place])     type 2   . DVT (deep venous thrombosis) [facility])     Past Surgical History  Procedure Laterality Date  . Coronary artery bypass graft  12/31/1995    LIMA to LAD,SVG to intermediate,SVG to CX,seq. svg to posterior descending and posterolateral RCA  . Cardiac catheterization  05/08/2009    small vessel disease  . Cardiac defibrillator placement  05/09/2009    Medtronic  . Back surgery    . I&d extremity Left 11/03/2013    Procedure: Left Leg Debride Ulcer, Apply Wound VAC and Theraskin;  Surgeon: Newt Minion, MD;  Location: [place];  Service: Orthopedics;  Laterality: Left;    Medications:  HOME MEDS: Prior to Admission medications   Medication Sig Start Date End Date Taking? Authorizing Provider  ALPRAZolam Duanne Moron) 1 MG tablet Take 1 mg by mouth daily as needed for anxiety or sleep.    Yes Historical Provider, MD  amiodarone (PACERONE) 200 MG tablet Take 200 mg by mouth daily.   Yes Historical Provider, MD  aspirin EC 81 MG tablet Take 81 mg by mouth at bedtime.   Yes Historical Provider, MD  atorvastatin (LIPITOR) 40 MG tablet Take 40 mg by mouth daily.   Yes Historical Provider, MD  clindamycin (CLEOCIN) 300 MG capsule Take 1 capsule (300 mg total) by mouth 3 (three) times daily. 01/05/15  Yes Alfonzo Beers, MD  digoxin (LANOXIN) 0.125 MG tablet Take 0.125 mg by mouth daily.    Yes  Historical Provider, MD  levothyroxine (SYNTHROID, LEVOTHROID) 112 MCG tablet Take 112 mcg by mouth daily before breakfast.   Yes Historical Provider, MD  magnesium oxide (MAG-OX) 400 MG tablet Take 400 mg by mouth daily.   Yes Historical Provider, MD  metoprolol (LOPRESSOR) 50 MG tablet TAKE 1 TABLET BY MOUTH TWICE DAILY. 10/23/14  Yes  Lorretta Harp, MD  niacin (NIASPAN) 1000 MG CR tablet TAKE (1) TABLET BY MOUTH AT BEDTIME. 08/23/14  Yes Lorretta Harp, MD  nitroGLYCERIN (NITROSTAT) 0.4 MG SL tablet Place 0.4 mg under the tongue every 5 (five) minutes as needed for chest pain.   Yes Historical Provider, MD  oxyCODONE-acetaminophen (PERCOCET) 10-325 MG per tablet Take 1 tablet by mouth every 4 (four) hours as needed for pain.   Yes Historical Provider, MD  pioglitazone (ACTOS) 45 MG tablet Take 45 mg by mouth daily.   Yes Historical Provider, MD  PRADAXA 75 MG CAPS capsule TAKE 1 CAPSULE BY MOUTH EVERY 12 HOURS. 07/12/14  Yes Troy Sine, MD  ramipril (ALTACE) 2.5 MG capsule Take 2.5 mg by mouth at bedtime.    Yes Historical Provider, MD  ZETIA 10 MG tablet TAKE 1 TABLET ONCE DAILY FOR CHOLESTEROL. 08/23/14  Yes Lorretta Harp, MD  zolpidem (AMBIEN) 10 MG tablet Take 10 mg by mouth at bedtime as needed for sleep.   Yes Historical Provider, MD     Allergies:  No Known Allergies  Social History:   reports that he quit smoking about 19 years ago. His smoking use included Cigarettes. He has never used smokeless tobacco. He reports that he does not drink alcohol or use illicit drugs.  Family History: Family History  Problem Relation Age of Onset  . Heart attack Mother   . Stroke Father      Physical Exam: Filed Vitals:   01/08/15 2000 01/08/15 2125 01/09/15 0519 01/09/15 0900  BP:  120/58 130/62 128/38  Pulse: 62 60 60 50  Temp:  98.2 F (36.8 C) 98.2 F (36.8 C) 97.5 F (36.4 C)  TempSrc:  Oral Oral Oral  Resp:  18 18 18   Height:      Weight:      SpO2:  98% 97% 95%   Blood pressure 128/38, pulse 50, temperature 97.5 F (36.4 C), temperature source Oral, resp. rate 18, height 6\' 2"  (1.88 m), weight 79.47 kg (175 lb 3.2 oz), SpO2 95 %.  GEN:  Pleasant  patient lying in the stretcher in no acute distress; cooperative with exam. PSYCH:  alert and oriented x4; does not appear anxious or depressed; affect  is appropriate. HEENT: Mucous membranes pink and anicteric; PERRLA; EOM intact; no cervical lymphadenopathy nor thyromegaly or carotid bruit; no JVD; There were no stridor. Neck is very supple. Breasts:: Not examined CHEST WALL: No tenderness CHEST: Normal respiration, clear to auscultation bilaterally.  HEART: Regular rate and rhythm.  There are no murmur, rub, or gallops.   BACK: No kyphosis or scoliosis; no CVA tenderness ABDOMEN: soft and non-tender; no masses, no organomegaly, normal abdominal bowel sounds; no pannus; no intertriginous candida. There is no rebound and no distention. Rectal Exam: Not done EXTREMITIES: No bone or joint deformity; age-appropriate arthropathy of the hands and knees; no edema; no ulcerations.  There is no calf tenderness. Genitalia: not examined PULSES: 2+ and symmetric SKIN: Normal hydration no rash or ulceration CNS: Cranial nerves 2-12 grossly intact no focal lateralizing neurologic deficit.  Speech is fluent; uvula elevated with phonation, facial symmetry  and tongue midline. DTR are normal bilaterally, cerebella exam is intact, barbinski is negative and strengths are equaled bilaterally.  No sensory loss.   Labs on Admission:  Basic Metabolic Panel:  Recent Labs Lab 01/05/15 1057 01/07/15 2129 01/08/15 0534  NA 138 137 137  K 4.1 4.2 4.3  CL 102 101 102  CO2 29 29 29   GLUCOSE 98 105* 77  BUN 26* 22* 19  CREATININE 1.61* 1.49* 1.36*  CALCIUM 8.6* 8.6* 8.3*   Liver Function Tests:  Recent Labs Lab 01/07/15 2129 01/08/15 0534  AST 51* 41  ALT 37 32  ALKPHOS 76 69  BILITOT 0.8 0.7  PROT 6.5 5.5*  ALBUMIN 3.1* 2.6*    CBC:  Recent Labs Lab 01/05/15 1057 01/07/15 2129 01/08/15 0534  WBC 10.2 7.5 5.7  NEUTROABS  --  5.0  --   HGB 9.8* 9.7* 8.8*  HCT 30.5* 30.3* 27.4*  MCV 103.0* 102.7* 101.9*  PLT 137* 182 156    CBG:  Recent Labs Lab 01/08/15 0731 01/08/15 1119 01/08/15 1615 01/08/15 2051 01/09/15 0737  GLUCAP 76  107* 98 126* 83    Assessment/Plan Present on Admission:  . Positive blood culture . Gram-positive bacteremia . Paroxysmal atrial fibrillation (HCC) . CAD s/p CABG  . Biventricular ICD (implantable cardioverter-defibrillator) in place . Abnormal serum protein electrophoresis   PLAN:  Cellulitis with Staph Aureus 1/2 positive BC:  Will continue with IV Lucianne Lei.   [facility] with [facility] the [facility].  He is anxious to be discharged but is agreeable to stay.  He is stable.   Other plans as per orders.  Code Status: FULL Haskel Khan, MD. [facility] Pager [phone] 7pm to 7am.  01/09/2015, 10:12 AM

## 2015-01-09 NOTE — Consult Note (Signed)
CHAMP Staph aureus bacteremia note:  1/2 with Staph aureus, sensitivities pending.  Associated with cellulitis.  Repeat blood cultures ngtd.  Has an ICD.    Recommend: TEE, cardiology consultation for consideration of device removal, particularly if concerns on exam or on TEE.  Would treat with prolonged IV antibiotics (4-6 weeks).    Alan Orr, ROBERT

## 2015-01-10 ENCOUNTER — Encounter (HOSPITAL_COMMUNITY): Payer: Medicare Other

## 2015-01-10 ENCOUNTER — Inpatient Hospital Stay (HOSPITAL_COMMUNITY): Payer: Medicare Other

## 2015-01-10 DIAGNOSIS — R7881 Bacteremia: Secondary | ICD-10-CM

## 2015-01-10 DIAGNOSIS — Z95810 Presence of automatic (implantable) cardiac defibrillator: Secondary | ICD-10-CM

## 2015-01-10 DIAGNOSIS — I480 Paroxysmal atrial fibrillation: Secondary | ICD-10-CM

## 2015-01-10 DIAGNOSIS — R769 Abnormal immunological finding in serum, unspecified: Secondary | ICD-10-CM

## 2015-01-10 DIAGNOSIS — I25811 Atherosclerosis of native coronary artery of transplanted heart without angina pectoris: Secondary | ICD-10-CM

## 2015-01-10 LAB — CULTURE, BLOOD (ROUTINE X 2): Culture: NO GROWTH

## 2015-01-10 LAB — GLUCOSE, CAPILLARY
GLUCOSE-CAPILLARY: 110 mg/dL — AB (ref 65–99)
GLUCOSE-CAPILLARY: 142 mg/dL — AB (ref 65–99)
Glucose-Capillary: 82 mg/dL (ref 65–99)
Glucose-Capillary: 113 mg/dL — ABNORMAL HIGH (ref 65–99)

## 2015-01-10 LAB — DIGOXIN LEVEL: Digoxin Level: 1.1 ng/mL (ref 0.8–2.0)

## 2015-01-10 MED ORDER — CEFAZOLIN SODIUM-DEXTROSE 2-3 GM-% IV SOLR
2.0000 g | Freq: Three times a day (TID) | INTRAVENOUS | Status: DC
  Administered 2015-01-10 – 2015-01-11 (×2): 2 g via INTRAVENOUS
  Filled 2015-01-10 (×12): qty 50

## 2015-01-10 MED ORDER — SODIUM CHLORIDE 0.9 % IJ SOLN
10.0000 mL | Freq: Two times a day (BID) | INTRAMUSCULAR | Status: DC
  Administered 2015-01-10 – 2015-01-11 (×2): 10 mL

## 2015-01-10 MED ORDER — SODIUM CHLORIDE 0.9 % IJ SOLN: 10.0000 mL | INTRAMUSCULAR | Status: DC | PRN

## 2015-01-10 NOTE — Progress Notes (Addendum)
Peripherally Inserted Central Catheter/Midline Placement  The IV Nurse has discussed with the patient and/or persons authorized to consent for the patient, the purpose of this procedure and the potential benefits and risks involved with this procedure.  The benefits include less needle sticks, lab draws from the catheter and patient may be discharged home with the catheter.  Risks include, but not limited to, infection, bleeding, blood clot (thrombus formation), and puncture of an artery; nerve damage and irregular heat beat.  Alternatives to this procedure were also discussed.  PICC/Midline Placement Documentation  PICC / Midline Single Lumen 78/47/84 PICC Right Basilic 39 cm 0 cm (Active)  Indication for Insertion or Continuance of Line Administration of hyperosmolar/irritating solutions (i.e. TPN, Vancomycin, etc.) 01/10/2015  3:00 PM  Exposed Catheter (cm) 0 cm 01/10/2015  3:00 PM  Site Assessment Clean;Dry;Intact 01/10/2015  3:00 PM  Line Status Flushed;Saline locked;Blood return noted 01/10/2015  3:00 PM  Dressing Type Transparent 01/10/2015  3:00 PM  Dressing Status Clean;Dry;Intact 01/10/2015  3:00 PM  Dressing Change Due 01/17/15 01/10/2015  3:00 PM       Roselind Messier 01/10/2015, 3:04 PM   Loma Sousa, RN notified that xray confirms proper placement of PICC and is ready for use.

## 2015-01-10 NOTE — Consult Note (Signed)
CARDIOLOGY CONSULT NOTE   Patient ID: Alan Orr MRN: [id number] DOB/AGE: Dec 31, 1945 69 y.o.  Admit Date: 01/07/2015 Referring Physician: Troy Primary Physician: Glo Herring., MD Consulting Cardiologist: Kate Sable MD Primary Cardiologist: Quay Burow MD  Reason for Consultation: TEE and need to remove ICD.   Clinical Summary Mr. Landa is a 69 y.o.male with know history of of ischemic cardiomyopathy, status post coronary artery bypass grafting in 1997. He was catheterized by Dr. Rex Kras, May 08, 2009, revealing patent grafts with moderate LV dysfunction, unchanged anatomy. 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 His other problems include hypertension, hyperlipidemia, and type 2 diabetes. He has a BiV ICD, implanted by Dr. Cristopher Peru, which Dr. Sallyanne Kuster follows on a quarterly outpatient basis.he had a Myoview stress test performed 03/17/12 that showed scar in the anterior wall apex and septum with an EF of 42%-  He was admitted with gram positive blood cultures for  MSSA and is also being treated for cellulitis with initial presentation for same with leg pain and redness that came on suddenly. He denies chest pain or dyspnea. PTH would ike [place] to comment on need to remove ICD and also need for TEE per recommendations per ID. He is currently on Vancomycin IV. States he is feeling much better.    No Known Allergies  Medications Scheduled Medications: . amiodarone  200 mg Oral Daily  . aspirin EC  81 mg Oral QHS  . atorvastatin  40 mg Oral Daily  . dabigatran  75 mg Oral Q12H  . digoxin  0.125 mg Oral Daily  . ezetimibe  10 mg Oral Daily  . insulin aspart  0-9 Units Subcutaneous TID WC  . levothyroxine  112 mcg Oral QAC breakfast  . metoprolol  50 mg Oral BID  . niacin  1,000 mg Oral QHS  . ramipril  2.5 mg Oral QHS  . vancomycin  750 mg  Intravenous Q12H    Infusions: . sodium chloride 10 mL/hr at 01/08/15 0245    PRN Medications: ALPRAZolam, ondansetron **OR** ondansetron (ZOFRAN) IV, oxyCODONE-acetaminophen, zolpidem   Past Medical History  Diagnosis Date  . Ischemic cardiomyopathy   . CAD (coronary artery disease)   . S/P CABG x 5   . PAF (paroxysmal atrial fibrillation) ([place])   . Hypertension   . Hyperlipidemia   . RBBB   . ICD (implantable cardioverter-defibrillator), single, in situ   . DM (diabetes mellitus) ([place])     type 2   . DVT (deep venous thrombosis) [facility])     Past Surgical History  Procedure Laterality Date  . Coronary artery bypass graft  12/31/1995    LIMA to LAD,SVG to intermediate,SVG to CX,seq. svg to posterior descending and posterolateral RCA  . Cardiac catheterization  05/08/2009    small vessel disease  . Cardiac defibrillator placement  05/09/2009    [employer]tr[employer]  . Back surgery    . I&d extremity Left 11/03/2013    Procedure: Left Leg Debride Ulcer, Apply Wound VAC and Theraskin;  Surgeon: Newt Minion, MD;  Location: [place];  Service: Orthopedics;  Laterality: Left;    Family History  Problem Relation Age of Onset  . Heart attack Mother   . Stroke Father     Social History Mr. Pech reports that he quit smoking about 19 years ago. His smoking use included Cigarettes. He has never used smokeless tobacco. Mr. Puccinelli  reports that he does not drink alcohol.  Review of Systems Complete review of systems are found to be negative unless outlined in H&P above.  Physical Examination Blood pressure 117/48, pulse 63, temperature 98.1 F (36.7 C), temperature source Oral, resp. rate 17, height 6\' 2"  (1.88 m), weight 175 lb 3.2 oz (79.47 kg), SpO2 99 %.  Intake/Output Summary (Last 24 hours) at 01/10/15 1213 Last data filed at 01/10/15 0937  Gross per 24 hour  Intake  662.5 ml  Output   1825 ml  Net -1162.5 ml    Telemetry: Paced rhythm.  GEN:No acute distress HEENT:  Conjunctiva and lids normal, oropharynx clear with moist mucosa. Neck: Supple, no elevated JVP or carotid bruits, no thyromegaly. Lungs: Crackles in the bases, but no wheezes.  Cardiac: Regular rate and rhythm, some crackles anteriorly.no S3 or significant systolic murmur, no pericardial rub. Abdomen: Soft, nontender, no hepatomegaly, bowel sounds present, no guarding or rebound. Extremities: No pitting edema, distal pulses 2+. Redness and erythema in the right pre-tibial area and calf.  Skin: Warm and dry. Musculoskeletal: No kyphosis. Neuropsychiatric: Alert and oriented x3, affect grossly appropriate.  Prior Cardiac Testing/Procedures 1. ICD Interrogation:09/11/2014 nterrogation of his dual-chamber biventricular defibrillator (Medtronic Concerto II) shows normal device function with 99.9% biventricular pacing and 84% atrial pacing. There have been no episodes of atrial fibrillation or any ventricular tachycardia. He has very low burden of PVCs. Lead parameters are all excellent. Generator voltage 2.73 V (RRT 2.63 V). Optivol index is increased but this has not correlated well with heart failure exacerbation in the past.  2. Echocardiogram 07/15/2009.  Left ventricle: The cavity size was normal. Wall thickness was  increased in a pattern of moderate LVH. Systolic function was  moderately to severely reduced. The estimated ejection fraction  was in the range of 30% to 35%. Akinesis of the distal  anteroseptal and apical myocardium. Doppler parameters are  consistent with abnormal left ventricular relaxation (grade 1  diastolic dysfunction). Cannot exclude apical thrombus. - Mitral valve: Mildly calcified annulus. Mild regurgitation. - Left atrium: The atrium was mildly dilated.  Lab Results  Basic Metabolic Panel:  Recent Labs Lab 01/05/15 1057 01/07/15 2129 01/08/15 0534  NA 138 137 137  K 4.1 4.2 4.3  CL 102 101 102  CO2 29 29 29   GLUCOSE 98 105* 77  BUN 26* 22*  19  CREATININE 1.61* 1.49* 1.36*  CALCIUM 8.6* 8.6* 8.3*    Liver Function Tests:  Recent Labs Lab 01/07/15 2129 01/08/15 0534  AST 51* 41  ALT 37 32  ALKPHOS 76 69  BILITOT 0.8 0.7  PROT 6.5 5.5*  ALBUMIN 3.1* 2.6*    CBC:  Recent Labs Lab 01/05/15 1057 01/07/15 2129 01/08/15 0534  WBC 10.2 7.5 5.7  NEUTROABS  --  5.0  --   HGB 9.8* 9.7* 8.8*  HCT 30.5* 30.3* 27.4*  MCV 103.0* 102.7* 101.9*  PLT 137* 182 156    ECG: AV Pacing   Impression and Recommendations 1. ICD in situ: in the setting of ICM Would not consider removing ICD at this time as he is currently asymptomatic and responding to IV abx. Recently interrogated in June of 2016. .   2. Gram positive Bacteremia in the setting of cellulitis: Will plan TEE for 2:30 pm tomorrow. Hold Pradaxa in the setting of anemia and need for TEE. Recheck CBC in am.   3. CAD: 2 vessel CABG with small vessel disease per cath in 2011. Continue ASA, and BB  and ACE.   4. Atrial fib: Heart rate is controlled on. EKG is showing AV pacing. Continue digoxin and amiodarone and will stop Pradaxa  for now. Checking CBC in the am.      Signed: Phill Myron. Lawrence NP [place]  01/10/2015, 12:13 PM Co-Sign MD  The patient was seen and examined, and I agree with the assessment and plan as documented above, with modifications as noted below.  Blood cultures positive for pan-sensitive Staph Aureus. Will hold Pradaxa and plan for TEE tomorrow to investigate for valvular and/or ICD lead vegetations. Anemic today, Hgb 8.8. If Hgb continues to drop, I would favor preemptively treating with prolonged IV antimicrobial therapy and postponing TEE until Hgb stabilizes given absence of fever, normal WBC count, and hemodynamic stability.   Kate Sable, MD, [facility]  01/10/2015 12:45 PM

## 2015-01-10 NOTE — Care Management Note (Addendum)
Case Management Note  Patient Details  Name: Alan Orr MRN: [id number] Date of Birth: [birth year]  Expected Discharge Date:  01/10/15               Expected Discharge Plan:  [place]  In-House Referral:     Discharge planning Services  CM Consult  Post Acute Care Choice:  Home Health Choice offered to:  Patient  DME Arranged:    DME Agency:     HH Arranged:  RN [place] Agency:  [place]  Status of Service:  In process, will continue to follow  Medicare Important Message Given:    Date Medicare IM Given:    Medicare IM give by:    Date Additional Medicare IM Given:    Additional Medicare Important Message give by:     If discussed at [place] of Stay Meetings, dates discussed:    Additional Comments: Pt will require IV abx after DC. Pt prefers to receive abx as OP through short stay. CM consulted with ST scheduler. It would be possible for pt to initiate OP abx over weekend. Explained to pt that if he requires abx that is needed multiple times a day it will not be feasible to come to hospital 2-3 times a day for 4-6 weeks and receiving abx at home would be more appropriate. Pt and wife are willing and able to learn to administer IV abs. Pt would like [facility] services if necessary. Pt's wife present and asking for [facility]. Adline Potter, [facility] made aware and will speak with pt's wife via phone. Will cont to follow.  Sherald Barge, RN 01/10/2015, 4:13 PM

## 2015-01-10 NOTE — Progress Notes (Signed)
ANTIBIOTIC CONSULT NOTE - Follow up  Pharmacy Consult for Vancomycin Indication: bacteremia  No Known Allergies  Patient Measurements: Height: 6\' 2"  (188 cm) Weight: 175 lb 3.2 oz (79.47 kg) IBW/kg (Calculated) : 82.2  Vital Signs: Temp: 98.1 F (36.7 C) (10/27 0604) Temp Source: Oral (10/27 0604) BP: 117/48 mmHg (10/27 0604) Pulse Rate: 63 (10/27 0929) Intake/Output from previous day: 10/26 0701 - 10/27 0700 In: 1112.5 [P.O.:600; I.V.:362.5; IV Piggyback:150] Out: 2450 [Urine:2450] Intake/Output from this shift: Total I/O In: 300 [IV Piggyback:300] Out: 250 [Urine:250]  Labs:  Recent Labs  01/07/15 2129 01/08/15 0534  WBC 7.5 5.7  HGB 9.7* 8.8*  PLT 182 156  CREATININE 1.49* 1.36*   Estimated Creatinine Clearance: 56.8 mL/min (by C-G formula based on Cr of 1.36). No results for input(s): VANCOTROUGH, VANCOPEAK, VANCORANDOM, GENTTROUGH, GENTPEAK, GENTRANDOM, TOBRATROUGH, TOBRAPEAK, TOBRARND, AMIKACINPEAK, AMIKACINTROU, AMIKACIN in the last 72 hours.   Microbiology: Recent Results (from the past 720 hour(s))  Blood culture (routine x 2)     Status: None   Collection Time: 01/05/15 10:57 AM  Result Value Ref Range Status   Specimen Description BLOOD LEFT ANTECUBITAL DRAWN BY RN  Final   Special Requests   Final    BOTTLES DRAWN AEROBIC AND ANAEROBIC AEB=6CC ANA=4CC   Culture NO GROWTH 5 DAYS  Final   Report Status 01/10/2015 FINAL  Final  Blood culture (routine x 2)     Status: None   Collection Time: 01/05/15 11:11 AM  Result Value Ref Range Status   Specimen Description BLOOD RIGHT ANTECUBITAL  Final   Special Requests   Final    BOTTLES DRAWN AEROBIC AND ANAEROBIC AEB=12CC ANA=10CC   Culture  Setup Time   Final    GRAM POSITIVE COCCI IN CLUSTERS RECOVERED FROM THE ANAEROBIC BOTTLE Gram Stain Report Called to,Read Back By and Verified With: OINOM,V., FLOW MANAGER, AT [place] ON 01/07/2015 BY BAUGHAM,M. Performed at [place]   Final   STAPHYLOCOCCUS AUREUS Performed at [facility]    Report Status 01/10/2015 FINAL  Final   Organism ID, Bacteria STAPHYLOCOCCUS AUREUS  Final      Susceptibility   Staphylococcus aureus - MIC*    CIPROFLOXACIN <=0.5 SENSITIVE Sensitive     ERYTHROMYCIN <=0.25 SENSITIVE Sensitive     GENTAMICIN <=0.5 SENSITIVE Sensitive     OXACILLIN 0.5 SENSITIVE Sensitive     TETRACYCLINE <=1 SENSITIVE Sensitive     VANCOMYCIN 1 SENSITIVE Sensitive     TRIMETH/SULFA <=10 SENSITIVE Sensitive     CLINDAMYCIN <=0.25 SENSITIVE Sensitive     RIFAMPIN <=0.5 SENSITIVE Sensitive     Inducible Clindamycin NEGATIVE Sensitive     * STAPHYLOCOCCUS AUREUS  Urine culture     Status: None   Collection Time: 01/05/15 12:45 PM  Result Value Ref Range Status   Specimen Description URINE, CLEAN CATCH  Final   Special Requests NONE  Final   Culture   Final    1,000 COLONIES/mL INSIGNIFICANT GROWTH Performed at [facility]    Report Status 01/07/2015 FINAL  Final  Blood culture (routine x 2)     Status: None (Preliminary result)   Collection Time: 01/07/15  9:29 PM  Result Value Ref Range Status   Specimen Description BLOOD LEFT ANTECUBITAL  Final   Special Requests BOTTLES DRAWN AEROBIC AND ANAEROBIC 6CC  Final   Culture NO GROWTH 3 DAYS  Final   Report Status PENDING  Incomplete  Blood culture (routine x 2)  Status: None (Preliminary result)   Collection Time: 01/07/15  9:45 PM  Result Value Ref Range Status   Specimen Description BLOOD RIGHT ANTECUBITAL  Final   Special Requests BOTTLES DRAWN AEROBIC AND ANAEROBIC 6CC  Final   Culture NO GROWTH 3 DAYS  Final   Report Status PENDING  Incomplete   Medical History: Past Medical History  Diagnosis Date  . Ischemic cardiomyopathy   . CAD (coronary artery disease)   . S/P CABG x 5   . PAF (paroxysmal atrial fibrillation) ([place])   . Hypertension   . Hyperlipidemia   . RBBB   . ICD (implantable cardioverter-defibrillator), single, in situ    . DM (diabetes mellitus) ([place])     type 2   . DVT (deep venous thrombosis) ([facility])    Anti-infectives    Start     Dose/Rate Route Frequency Ordered Stop   01/10/15 2200  ceFAZolin (ANCEF) IVPB 2 g/50 mL premix     2 g 100 mL/hr over 30 Minutes Intravenous 3 times per day 01/10/15 1237     01/08/15 1000  vancomycin (VANCOCIN) IVPB 750 mg/150 ml premix  Status:  Discontinued     750 mg 150 mL/hr over 60 Minutes Intravenous Every 12 hours 01/08/15 0741 01/10/15 1237   01/07/15 2130  vancomycin (VANCOCIN) 1,500 mg in sodium chloride 0.9 % 500 mL IVPB     1,500 mg 250 mL/hr over 120 Minutes Intravenous  Once 01/07/15 2124 01/08/15 0041     Assessment: Patient was seen in the ED is ago at that time there was a concern for right lower extremity cellulitis, patient was given a dose of vancomycin and Zosyn and then sent home on clindamycin. At that time blood cultures were drawn. Patient was doing fine at home when lab called and told that one out of 2 bottles of blood cultures was positive for gram-positive cocci in clusters and patient was instructed to come back to the ED. The blood cx from 10/22 +MSSA. Discussed with Dr. Linus Salmons and will deescalate Vancomycin to Cefazolin.  Consult for Cardiology today.    Goal of Therapy:  Resolution of infection  Plan:  Cefazolin 2gm IV q8h Monitor labs, renal fxn, and c/s Duration of therapy per MD  Isac Sarna, BS Pharm D, [place] Pharmacist Pager [phone]   01/10/2015,12:40 PM

## 2015-01-10 NOTE — Progress Notes (Signed)
[facility] PROGRESS NOTE  Alan Orr WJX:[id number] DOB: 1945/07/28    PCP:   Glo Herring., MD   HPI:  Alan Orr is an 69 y.o. male admitted for 1/2 positive blood culture, gotten when he was seen in the ER for celllulitis of the right lower extremity, and was admitted and given IV Vancomycin. He also had another set of BC done on admission that is still pending, but NGTD. Both BC are now positive for SA pan sensitive.  He has no leukocytosis, fever, or chills. He has DM and has a Metronic ICD. ID recommended TEE and cardiology consultation in consideration for ICD removal, and 4-6 weeks of IV antibiotics.  Cellulitis is markedly improved.    Rewiew of Systems:  Constitutional: Negative for malaise, fever and chills. No significant weight loss or weight gain Eyes: Negative for eye pain, redness and discharge, diplopia, visual changes, or flashes of light. ENMT: Negative for ear pain, hoarseness, nasal congestion, sinus pressure and sore throat. No headaches; tinnitus, drooling, or problem swallowing. Cardiovascular: Negative for chest pain, palpitations, diaphoresis, dyspnea and peripheral edema. ; No orthopnea, PND Respiratory: Negative for cough, hemoptysis, wheezing and stridor. No pleuritic chestpain. Gastrointestinal: Negative for nausea, vomiting, diarrhea, constipation, abdominal pain, melena, blood in stool, hematemesis, jaundice and rectal bleeding.    Genitourinary: Negative for frequency, dysuria, incontinence,flank pain and hematuria; Musculoskeletal: Negative for back pain and neck pain. Negative for swelling and trauma.;  Skin: . Negative for pruritus, rash, abrasions, bruising and skin lesion.; ulcerations Neuro: Negative for headache, lightheadedness and neck stiffness. Negative for weakness, altered level of consciousness , altered mental status, extremity weakness, burning feet, involuntary movement, seizure and syncope.  Psych: negative for  anxiety, depression, insomnia, tearfulness, panic attacks, hallucinations, paranoia, suicidal or homicidal ideation    Past Medical History  Diagnosis Date  . Ischemic cardiomyopathy   . CAD (coronary artery disease)   . S/P CABG x 5   . PAF (paroxysmal atrial fibrillation) ([place])   . Hypertension   . Hyperlipidemia   . RBBB   . ICD (implantable cardioverter-defibrillator), single, in situ   . DM (diabetes mellitus) ([place])     type 2   . DVT (deep venous thrombosis) [facility])     Past Surgical History  Procedure Laterality Date  . Coronary artery bypass graft  12/31/1995    LIMA to LAD,SVG to intermediate,SVG to CX,seq. svg to posterior descending and posterolateral RCA  . Cardiac catheterization  05/08/2009    small vessel disease  . Cardiac defibrillator placement  05/09/2009    [employer]  . Back surgery    . I&d extremity Left 11/03/2013    Procedure: Left Leg Debride Ulcer, Apply Wound VAC and [place];  Surgeon: Newt Minion, MD;  Location: [facility];  Service: Orthopedics;  Laterality: Left;    Medications:  HOME MEDS: Prior to Admission medications   Medication Sig Start Date End Date Taking? Authorizing Provider  ALPRAZolam Duanne Moron) 1 MG tablet Take 1 mg by mouth daily as needed for anxiety or sleep.    Yes Historical Provider, MD  amiodarone (PACERONE) 200 MG tablet Take 200 mg by mouth daily.   Yes Historical Provider, MD  aspirin EC 81 MG tablet Take 81 mg by mouth at bedtime.   Yes Historical Provider, MD  atorvastatin (LIPITOR) 40 MG tablet Take 40 mg by mouth daily.   Yes Historical Provider, MD  clindamycin (CLEOCIN) 300 MG capsule Take 1 capsule (300 mg total) by mouth 3 (  three) times daily. 01/05/15  Yes Alfonzo Beers, MD  digoxin (LANOXIN) 0.125 MG tablet Take 0.125 mg by mouth daily.    Yes Historical Provider, MD  levothyroxine (SYNTHROID, LEVOTHROID) 112 MCG tablet Take 112 mcg by mouth daily before breakfast.   Yes Historical Provider, MD  magnesium oxide (MAG-OX)  400 MG tablet Take 400 mg by mouth daily.   Yes Historical Provider, MD  metoprolol (LOPRESSOR) 50 MG tablet TAKE 1 TABLET BY MOUTH TWICE DAILY. 10/23/14  Yes Lorretta Harp, MD  niacin (NIASPAN) 1000 MG CR tablet TAKE (1) TABLET BY MOUTH AT BEDTIME. 08/23/14  Yes Lorretta Harp, MD  nitroGLYCERIN (NITROSTAT) 0.4 MG SL tablet Place 0.4 mg under the tongue every 5 (five) minutes as needed for chest pain.   Yes Historical Provider, MD  oxyCODONE-acetaminophen (PERCOCET) 10-325 MG per tablet Take 1 tablet by mouth every 4 (four) hours as needed for pain.   Yes Historical Provider, MD  pioglitazone (ACTOS) 45 MG tablet Take 45 mg by mouth daily.   Yes Historical Provider, MD  PRADAXA 75 MG CAPS capsule TAKE 1 CAPSULE BY MOUTH EVERY 12 HOURS. 07/12/14  Yes Troy Sine, MD  ramipril (ALTACE) 2.5 MG capsule Take 2.5 mg by mouth at bedtime.    Yes Historical Provider, MD  ZETIA 10 MG tablet TAKE 1 TABLET ONCE DAILY FOR CHOLESTEROL. 08/23/14  Yes Lorretta Harp, MD  zolpidem (AMBIEN) 10 MG tablet Take 10 mg by mouth at bedtime as needed for sleep.   Yes Historical Provider, MD     Allergies:  No Known Allergies  Social History:   reports that he quit smoking about 19 years ago. His smoking use included Cigarettes. He has never used smokeless tobacco. He reports that he does not drink alcohol or use illicit drugs.  Family History: Family History  Problem Relation Age of Onset  . Heart attack Mother   . Stroke Father      Physical Exam: Filed Vitals:   01/09/15 1408 01/09/15 2133 01/10/15 0604 01/10/15 0929  BP: 116/44 117/41 117/48   Pulse: 59 63 62 63  Temp: 98 F (36.7 C) 98.6 F (37 C) 98.1 F (36.7 C)   TempSrc: Oral Oral Oral   Resp: 20 18 17    Height:      Weight:      SpO2: 98% 98% 99%    Blood pressure 117/48, pulse 63, temperature 98.1 F (36.7 C), temperature source Oral, resp. rate 17, height 6\' 2"  (1.88 m), weight 79.47 kg (175 lb 3.2 oz), SpO2 99 %.  GEN:  Pleasant   patient lying in the stretcher in no acute distress; cooperative with exam. PSYCH:  alert and oriented x4; does not appear anxious or depressed; affect is appropriate. HEENT: Mucous membranes pink and anicteric; PERRLA; EOM intact; no cervical lymphadenopathy nor thyromegaly or carotid bruit; no JVD; There were no stridor. Neck is very supple. Breasts:: Not examined CHEST WALL: No tenderness CHEST: Normal respiration, clear to auscultation bilaterally.  HEART: Regular rate and rhythm.  There are no murmur, rub, or gallops.   BACK: No kyphosis or scoliosis; no CVA tenderness ABDOMEN: soft and non-tender; no masses, no organomegaly, normal abdominal bowel sounds; no pannus; no intertriginous candida. There is no rebound and no distention. Rectal Exam: Not done EXTREMITIES: No bone or joint deformity; age-appropriate arthropathy of the hands and knees; no edema; no ulcerations.  There is no calf tenderness.  Cellulitis is markedly better.  Genitalia: not examined PULSES:  2+ and symmetric SKIN: Normal hydration no rash or ulceration CNS: Cranial nerves 2-12 grossly intact no focal lateralizing neurologic deficit.  Speech is fluent; uvula elevated with phonation, facial symmetry and tongue midline. DTR are normal bilaterally, cerebella exam is intact, barbinski is negative and strengths are equaled bilaterally.  No sensory loss.   Labs on Admission:  Basic Metabolic Panel:  Recent Labs Lab 01/05/15 1057 01/07/15 2129 01/08/15 0534  NA 138 137 137  K 4.1 4.2 4.3  CL 102 101 102  CO2 29 29 29   GLUCOSE 98 105* 77  BUN 26* 22* 19  CREATININE 1.61* 1.49* 1.36*  CALCIUM 8.6* 8.6* 8.3*   Liver Function Tests:  Recent Labs Lab 01/07/15 2129 01/08/15 0534  AST 51* 41  ALT 37 32  ALKPHOS 76 69  BILITOT 0.8 0.7  PROT 6.5 5.5*  ALBUMIN 3.1* 2.6*   No results for input(s): LIPASE, AMYLASE in the last 168 hours. No results for input(s): AMMONIA in the last 168 hours. CBC:  Recent  Labs Lab 01/05/15 1057 01/07/15 2129 01/08/15 0534  WBC 10.2 7.5 5.7  NEUTROABS  --  5.0  --   HGB 9.8* 9.7* 8.8*  HCT 30.5* 30.3* 27.4*  MCV 103.0* 102.7* 101.9*  PLT 137* 182 156   CBG:  Recent Labs Lab 01/09/15 0737 01/09/15 1111 01/09/15 1644 01/09/15 2132 01/10/15 0745  GLUCAP 83 116* 120* 148* 82    Assessment/Plan Present on Admission:  . Positive blood culture . Gram-positive bacteremia . Paroxysmal atrial fibrillation (HCC) . CAD s/p CABG  . Biventricular ICD (implantable cardioverter-defibrillator) in place . Abnormal serum protein electrophoresis  PLAN:  Cellulitis with MSSA blood culture positive.  Will consult cardiology and place PICC line for long term antibiotics.  Patient is stable.   Afib with anticoagulation:  Continue Pradaxa and Dig.  Check Dig level. HLD:  Continue with statin.  Other plans as per orders.  Code Status: FULL Haskel Khan, MD. [facility] Pager [phone] 7pm to 7am.  01/10/2015, 10:31 AM

## 2015-01-11 ENCOUNTER — Inpatient Hospital Stay (HOSPITAL_COMMUNITY): Payer: Medicare Other

## 2015-01-11 ENCOUNTER — Other Ambulatory Visit (HOSPITAL_COMMUNITY): Payer: Self-pay | Admitting: *Deleted

## 2015-01-11 ENCOUNTER — Encounter (HOSPITAL_COMMUNITY)
Admission: EM | Disposition: A | Discharge status: Home / Self Care | Payer: Self-pay | Source: Home / Self Care | Attending: Internal Medicine

## 2015-01-11 ENCOUNTER — Ambulatory Visit: Admit: 2015-01-11 | Payer: Self-pay | Admitting: Cardiovascular Disease

## 2015-01-11 ENCOUNTER — Encounter (HOSPITAL_COMMUNITY): Payer: Self-pay | Admitting: *Deleted

## 2015-01-11 ENCOUNTER — Telehealth (HOSPITAL_BASED_OUTPATIENT_CLINIC_OR_DEPARTMENT_OTHER): Payer: Self-pay | Admitting: Emergency Medicine

## 2015-01-11 DIAGNOSIS — I255 Ischemic cardiomyopathy: Secondary | ICD-10-CM

## 2015-01-11 HISTORY — PX: TEE WITHOUT CARDIOVERSION: SHX5443

## 2015-01-11 LAB — CBC WITH DIFFERENTIAL/PLATELET
BASOS ABS: 0 10*3/uL (ref 0.0–0.1)
BASOS PCT: 0 %
Eosinophils Absolute: 0.2 10*3/uL (ref 0.0–0.7)
Eosinophils Relative: 4 %
HEMATOCRIT: 27.7 % — AB (ref 39.0–52.0)
HEMOGLOBIN: 9.2 g/dL — AB (ref 13.0–17.0)
LYMPHS PCT: 35 %
Lymphs Abs: 2.1 10*3/uL (ref 0.7–4.0)
MCH: 33.3 pg (ref 26.0–34.0)
MCHC: 33.2 g/dL (ref 30.0–36.0)
MCV: 100.4 fL — AB (ref 78.0–100.0)
MONOS PCT: 8 %
Monocytes Absolute: 0.5 10*3/uL (ref 0.1–1.0)
NEUTROS ABS: 3.2 10*3/uL (ref 1.7–7.7)
NEUTROS PCT: 53 %
Platelets: 225 10*3/uL (ref 150–400)
RBC: 2.76 MIL/uL — ABNORMAL LOW (ref 4.22–5.81)
RDW: 15.8 % — ABNORMAL HIGH (ref 11.5–15.5)
WBC: 6 10*3/uL (ref 4.0–10.5)

## 2015-01-11 LAB — GLUCOSE, CAPILLARY
GLUCOSE-CAPILLARY: 98 mg/dL (ref 65–99)
Glucose-Capillary: 86 mg/dL (ref 65–99)
Glucose-Capillary: 92 mg/dL (ref 65–99)
Glucose-Capillary: 101 mg/dL — ABNORMAL HIGH (ref 65–99)

## 2015-01-11 SURGERY — ECHOCARDIOGRAM, TRANSESOPHAGEAL
Anesthesia: Moderate Sedation

## 2015-01-11 MED ORDER — MIDAZOLAM HCL 5 MG/5ML IJ SOLN
INTRAMUSCULAR | Status: AC
  Filled 2015-01-11: qty 5

## 2015-01-11 MED ORDER — BUTAMBEN-TETRACAINE-BENZOCAINE 2-2-14 % EX AERO
INHALATION_SPRAY | CUTANEOUS | Status: AC
  Filled 2015-01-11: qty 20

## 2015-01-11 MED ORDER — SODIUM CHLORIDE 0.9 % IV SOLN
INTRAVENOUS | Status: DC
Start: 2015-01-11 — End: 2015-01-11

## 2015-01-11 MED ORDER — SODIUM CHLORIDE BACTERIOSTATIC 0.9 % IJ SOLN
INTRAMUSCULAR | Status: AC
  Filled 2015-01-11: qty 10

## 2015-01-11 MED ORDER — DEXTROSE 5 % IV SOLN
2.0000 g | INTRAVENOUS | Status: DC
  Administered 2015-01-11 – 2015-01-12 (×2): 2 g via INTRAVENOUS
  Filled 2015-01-11 (×4): qty 2

## 2015-01-11 MED ORDER — FENTANYL CITRATE (PF) 100 MCG/2ML IJ SOLN
INTRAMUSCULAR | Status: AC
  Filled 2015-01-11: qty 2

## 2015-01-11 MED ORDER — LIDOCAINE VISCOUS 2 % MT SOLN
OROMUCOSAL | Status: DC | PRN
  Administered 2015-01-11: 5 mL via OROMUCOSAL

## 2015-01-11 MED ORDER — SODIUM CHLORIDE 0.9 % IV SOLN
INTRAVENOUS | Status: DC
  Administered 2015-01-11: 1000 mL via INTRAVENOUS

## 2015-01-11 MED ORDER — FENTANYL CITRATE (PF) 100 MCG/2ML IJ SOLN
INTRAMUSCULAR | Status: DC | PRN
  Administered 2015-01-11: 50 ug via INTRAVENOUS
  Administered 2015-01-11 (×2): 25 ug via INTRAVENOUS

## 2015-01-11 MED ORDER — MIDAZOLAM HCL 2 MG/2ML IJ SOLN
INTRAMUSCULAR | Status: DC | PRN
  Administered 2015-01-11 (×4): 2 mg via INTRAVENOUS

## 2015-01-11 MED ORDER — LIDOCAINE VISCOUS 2 % MT SOLN
OROMUCOSAL | Status: AC
  Filled 2015-01-11: qty 15

## 2015-01-11 MED ORDER — BUTAMBEN-TETRACAINE-BENZOCAINE 2-2-14 % EX AERO
INHALATION_SPRAY | CUTANEOUS | Status: DC | PRN
  Administered 2015-01-11: 2 via TOPICAL

## 2015-01-11 NOTE — Care Management Note (Signed)
Case Management Note  Patient Details  Name: Alan Orr MRN: [id number] Date of Birth: [birth year]  Expected Discharge Date:  01/10/15               Expected Discharge Plan:  Home/Self Care  In-House Referral:  NA  Discharge planning Services  CM Consult  Post Acute Care Choice:  NA Choice offered to:  NA  DME Arranged:    DME Agency:     HH Arranged:    [place] Agency:     Status of Service:  Completed, signed off  Medicare Important Message Given:  Yes-second notification given Date Medicare IM Given:    Medicare IM give by:    Date Additional Medicare IM Given:    Additional Medicare Important Message give by:     If discussed at [place] of Stay Meetings, dates discussed:    Additional Comments: Anticipate pt will DC home over weekend. Pt will be discharged on daily IV abx. Pt will receive IV abx through short stay at [facility]. Rx has been faxed to [facility] and he has been scheduled to receive abx at 1pm through the next week. Pt is aware of and agreeable to DC plan. [facility] has been made aware of need for IV abx as OP on Sunday. No further CM needs at this time.  Sherald Barge, RN 01/11/2015, 2:03 PM

## 2015-01-11 NOTE — Care Management Important Message (Signed)
Important Message  Patient Details  Name: Alan Orr MRN: [id number] Date of Birth: [birth year]   Medicare Important Message Given:  Yes-second notification given    Sherald Barge, RN 01/11/2015, 2:02 PM

## 2015-01-11 NOTE — Progress Notes (Signed)
[facility] PROGRESS NOTE  Alan Orr EHU:[id number] DOB: [birth year]    PCP:   Glo Herring., MD   HPI:  Alan Orr is an 69 y.o. male admitted for 2/2 positive blood culture, gotten when he was seen in the ER for celllulitis of the right lower extremity, and was admitted and given IV Vancomycin. He also had another set of BC done on admission that is still pending, but NGTD. Both BC are now positive for SA pan sensitive.  He has no leukocytosis, fever, or chills. He has DM and has a Metronic ICD. ID recommended TEE and cardiology consultation in consideration for ICD removal, and 4-6 weeks of IV antibiotics. Cellulitis is markedly improved.  Cardiology will do TEE, but doesn't recommend ICD removal.  Interrogation was done. PICC line was placed.  Family updated.   Rewiew of Systems:  Constitutional: Negative for malaise, fever and chills. No significant weight loss or weight gain Eyes: Negative for eye pain, redness and discharge, diplopia, visual changes, or flashes of light. ENMT: Negative for ear pain, hoarseness, nasal congestion, sinus pressure and sore throat. No headaches; tinnitus, drooling, or problem swallowing. Cardiovascular: Negative for chest pain, palpitations, diaphoresis, dyspnea and peripheral edema. ; No orthopnea, PND Respiratory: Negative for cough, hemoptysis, wheezing and stridor. No pleuritic chestpain. Gastrointestinal: Negative for nausea, vomiting, diarrhea, constipation, abdominal pain, melena, blood in stool, hematemesis, jaundice and rectal bleeding.    Genitourinary: Negative for frequency, dysuria, incontinence,flank pain and hematuria; Musculoskeletal: Negative for back pain and neck pain. Negative for swelling and trauma.;  Skin: . Negative for pruritus, rash, abrasions, bruising and skin lesion.; ulcerations Neuro: Negative for headache, lightheadedness and neck stiffness. Negative for weakness, altered level of consciousness ,  altered mental status, extremity weakness, burning feet, involuntary movement, seizure and syncope.  Psych: negative for anxiety, depression, insomnia, tearfulness, panic attacks, hallucinations, paranoia, suicidal or homicidal ideation    Past Medical History  Diagnosis Date  . Ischemic cardiomyopathy   . CAD (coronary artery disease)   . S/P CABG x 5   . PAF (paroxysmal atrial fibrillation) ([place])   . Hypertension   . Hyperlipidemia   . RBBB   . ICD (implantable cardioverter-defibrillator), single, in situ   . DM (diabetes mellitus) ([place])     type 2   . DVT (deep venous thrombosis) [facility])     Past Surgical History  Procedure Laterality Date  . Coronary artery bypass graft  12/31/1995    LIMA to LAD,SVG to intermediate,SVG to CX,seq. svg to posterior descending and posterolateral RCA  . Cardiac catheterization  05/08/2009    small vessel disease  . Cardiac defibrillator placement  05/09/2009    [employer]  . Back surgery    . I&d extremity Left 11/03/2013    Procedure: Left Leg Debride Ulcer, Apply Wound VAC and [place];  Surgeon: Newt Minion, MD;  Location: [place];  Service: Orthopedics;  Laterality: Left;    Medications:  HOME MEDS: Prior to Admission medications   Medication Sig Start Date End Date Taking? Authorizing Provider  ALPRAZolam Duanne Moron) 1 MG tablet Take 1 mg by mouth daily as needed for anxiety or sleep.    Yes Historical Provider, MD  amiodarone (PACERONE) 200 MG tablet Take 200 mg by mouth daily.   Yes Historical Provider, MD  aspirin EC 81 MG tablet Take 81 mg by mouth at bedtime.   Yes Historical Provider, MD  atorvastatin (LIPITOR) 40 MG tablet Take 40 mg by mouth daily.  Yes Historical Provider, MD  clindamycin (CLEOCIN) 300 MG capsule Take 1 capsule (300 mg total) by mouth 3 (three) times daily. 01/05/15  Yes Alfonzo Beers, MD  digoxin (LANOXIN) 0.125 MG tablet Take 0.125 mg by mouth daily.    Yes Historical Provider, MD  levothyroxine (SYNTHROID,  LEVOTHROID) 112 MCG tablet Take 112 mcg by mouth daily before breakfast.   Yes Historical Provider, MD  magnesium oxide (MAG-OX) 400 MG tablet Take 400 mg by mouth daily.   Yes Historical Provider, MD  metoprolol (LOPRESSOR) 50 MG tablet TAKE 1 TABLET BY MOUTH TWICE DAILY. 10/23/14  Yes Lorretta Harp, MD  niacin (NIASPAN) 1000 MG CR tablet TAKE (1) TABLET BY MOUTH AT BEDTIME. 08/23/14  Yes Lorretta Harp, MD  nitroGLYCERIN (NITROSTAT) 0.4 MG SL tablet Place 0.4 mg under the tongue every 5 (five) minutes as needed for chest pain.   Yes Historical Provider, MD  oxyCODONE-acetaminophen (PERCOCET) 10-325 MG per tablet Take 1 tablet by mouth every 4 (four) hours as needed for pain.   Yes Historical Provider, MD  pioglitazone (ACTOS) 45 MG tablet Take 45 mg by mouth daily.   Yes Historical Provider, MD  PRADAXA 75 MG CAPS capsule TAKE 1 CAPSULE BY MOUTH EVERY 12 HOURS. 07/12/14  Yes Troy Sine, MD  ramipril (ALTACE) 2.5 MG capsule Take 2.5 mg by mouth at bedtime.    Yes Historical Provider, MD  ZETIA 10 MG tablet TAKE 1 TABLET ONCE DAILY FOR CHOLESTEROL. 08/23/14  Yes Lorretta Harp, MD  zolpidem (AMBIEN) 10 MG tablet Take 10 mg by mouth at bedtime as needed for sleep.   Yes Historical Provider, MD     Allergies:  No Known Allergies  Social History:   reports that he quit smoking about 19 years ago. His smoking use included Cigarettes. He has never used smokeless tobacco. He reports that he does not drink alcohol or use illicit drugs.  Family History: Family History  Problem Relation Age of Onset  . Heart attack Mother   . Stroke Father      Physical Exam: Filed Vitals:   01/10/15 0929 01/10/15 1347 01/10/15 2044 01/11/15 0540  BP:  132/45 120/50   Pulse: 63 55 58 58  Temp:  98.2 F (36.8 C) 98.7 F (37.1 C) 98.1 F (36.7 C)  TempSrc:  Oral Oral Oral  Resp:  18 17 18   Height:      Weight:      SpO2:  99% 98% 97%   Blood pressure 120/50, pulse 58, temperature 98.1 F (36.7  C), temperature source Oral, resp. rate 18, height 6\' 2"  (1.88 m), weight 79.47 kg (175 lb 3.2 oz), SpO2 97 %.  GEN:  Pleasant  patient lying in the stretcher in no acute distress; cooperative with exam. PSYCH:  alert and oriented x4; does not appear anxious or depressed; affect is appropriate. HEENT: Mucous membranes pink and anicteric; PERRLA; EOM intact; no cervical lymphadenopathy nor thyromegaly or carotid bruit; no JVD; There were no stridor. Neck is very supple. Breasts:: Not examined CHEST WALL: No tenderness CHEST: Normal respiration, clear to auscultation bilaterally.  HEART: Regular rate and rhythm.  There are no murmur, rub, or gallops.   BACK: No kyphosis or scoliosis; no CVA tenderness ABDOMEN: soft and non-tender; no masses, no organomegaly, normal abdominal bowel sounds; no pannus; no intertriginous candida. There is no rebound and no distention. Rectal Exam: Not done EXTREMITIES: No bone or joint deformity; age-appropriate arthropathy of the hands and knees; no  edema; no ulcerations.  There is no calf tenderness. Genitalia: not examined PULSES: 2+ and symmetric SKIN: Normal hydration right lower extremity erythema.  CNS: Cranial nerves 2-12 grossly intact no focal lateralizing neurologic deficit.  Speech is fluent; uvula elevated with phonation, facial symmetry and tongue midline. DTR are normal bilaterally, cerebella exam is intact, barbinski is negative and strengths are equaled bilaterally.  No sensory loss.   Labs on Admission:  Basic Metabolic Panel:  Recent Labs Lab 01/05/15 1057 01/07/15 2129 01/08/15 0534  NA 138 137 137  K 4.1 4.2 4.3  CL 102 101 102  CO2 29 29 29   GLUCOSE 98 105* 77  BUN 26* 22* 19  CREATININE 1.61* 1.49* 1.36*  CALCIUM 8.6* 8.6* 8.3*   Liver Function Tests:  Recent Labs Lab 01/07/15 2129 01/08/15 0534  AST 51* 41  ALT 37 32  ALKPHOS 76 69  BILITOT 0.8 0.7  PROT 6.5 5.5*  ALBUMIN 3.1* 2.6*   CBC:  Recent Labs Lab  01/05/15 1057 01/07/15 2129 01/08/15 0534 01/11/15 0530  WBC 10.2 7.5 5.7 6.0  NEUTROABS  --  5.0  --  3.2  HGB 9.8* 9.7* 8.8* 9.2*  HCT 30.5* 30.3* 27.4* 27.7*  MCV 103.0* 102.7* 101.9* 100.4*  PLT 137* 182 156 225   CBG:  Recent Labs Lab 01/10/15 0745 01/10/15 1123 01/10/15 1633 01/10/15 2045 01/11/15 0750  GLUCAP 82 113* 110* 142* 86     Radiological Exams on Admission: Dg Chest Port 1 View  01/10/2015  CLINICAL DATA:  Line placement. EXAM: PORTABLE CHEST 1 VIEW COMPARISON:  January 05, 2015. FINDINGS: The heart size and mediastinal contours are within normal limits. Both lungs are clear. No pneumothorax or pleural effusion is noted. Status post coronary artery bypass graft. Left-sided pacemaker is unchanged in position. Interval placement of right-sided PICC line with distal tip in the expected position of the SVC. The visualized skeletal structures are unremarkable. IMPRESSION: Interval placement of right-sided PICC line with distal tip in expected position of the SVC. No acute cardiopulmonary abnormality seen. Electronically Signed   By: Marijo Conception, M.D.   On: 01/10/2015 15:42    Assessment/Plan Present on Admission:  . Positive blood culture . Gram-positive bacteremia . Paroxysmal atrial fibrillation (HCC) . CAD s/p CABG  . Biventricular ICD (implantable cardioverter-defibrillator) in place . Abnormal serum protein electrophoresis  PLAN:  Bacteremia:  Will plan for 4 weeks of Rocephin 2 grams IV Q 24 hours.  I spoke with Dr Alfonso Patten. [place] who agreed.  Plan for TEE this afternoon, and if no vegetation, will d/c tomorrow.  Afib: Rate controlled.  Dig level therapeutic.  Will continue with meds, including Amiodarone, and resume Pradaxa after TEE.  Anemia:  Stable Hb of 9.2 g per dL.   Other plans as per orders.  Code Status: FULL CODE>    Orvan Falconer, MD. [facility] Pager [phone] 7pm to 7am.  01/11/2015, 9:56 AM

## 2015-01-11 NOTE — Telephone Encounter (Signed)
Post ED Visit - Positive Culture Follow-up  Culture report reviewed by antimicrobial stewardship pharmacist:  []  Heide Guile, Pharm.D., BCPS []  Alycia Rossetti, Pharm.D., BCPS []  [place], [place].D., BCPS, AAHIVP []  Legrand Como, Pharm.D., BCPS, AAHIVP []  [employer], Pharm.D. []  Milus Glazier, Pharm.D. Angela Burke PharmD  Positive blood culture Staph Aureus Treated with clindamycin, organism sensitive to the same and no further patient follow-up is required at this time.  Hazle Nordmann 01/11/2015, 10:20 AM

## 2015-01-11 NOTE — Discharge Instructions (Signed)
On Sunday come to the [facility] Emergency Room Registration (@ 1pm) - tell them you are here to receive your scheduled IV antibiotics, they will bring you to the nursing department  Starting on Monday 01/14/15 come to Short Stay Registration at 1 pm to receive your antibiotic  You will be scheduled for daily infusions at 1 pm through the next week, On Friday 01/18/2015 you may reassess your time and make changes if your interested.

## 2015-01-12 LAB — CULTURE, BLOOD (ROUTINE X 2)
CULTURE: NO GROWTH
Culture: NO GROWTH

## 2015-01-12 LAB — GLUCOSE, CAPILLARY
GLUCOSE-CAPILLARY: 134 mg/dL — AB (ref 65–99)
GLUCOSE-CAPILLARY: 76 mg/dL (ref 65–99)
GLUCOSE-CAPILLARY: 95 mg/dL (ref 65–99)

## 2015-01-12 MED ORDER — DEXTROSE 5 % IV SOLN: 2.0000 g | INTRAVENOUS | Status: DC

## 2015-01-12 NOTE — Progress Notes (Signed)
D/c instructions reviewed with patient. Verbalized understanding. Pt dc'd to home with wife.

## 2015-01-12 NOTE — Discharge Summary (Signed)
Physician Discharge Summary  Alan Orr YIR:[id number] DOB: 1945/04/20 DOA: 01/07/2015  PCP: Glo Herring., MD  Admit date: 01/07/2015 Discharge date: 01/12/2015  Time spent: 35 minutes  Recommendations for Outpatient Follow-up:  1. Follow up with PCP in one week.   Discharge Diagnoses:  Active Problems:   Biventricular ICD (implantable cardioverter-defibrillator) in place   CAD s/p CABG    Paroxysmal atrial fibrillation (HCC)   Abnormal serum protein electrophoresis   Positive blood culture   Gram-positive bacteremia   Discharge Condition: Stable.   Diet recommendation: carb modified diet, health healthy.   Filed Weights   01/07/15 2114 01/08/15 0112  Weight: 81.647 kg (180 lb) 79.47 kg (175 lb 3.2 oz)    History of present illness:   Patient was called back for admission due to 1/2 positive BC for GPC.  As per dr Victoriano Lain and P on Jan 07, 3076:  " 69 year old male who  has a past medical history of Ischemic cardiomyopathy; CAD (coronary artery disease); S/P CABG x 5; PAF (paroxysmal atrial fibrillation) ([place]); Hypertension; Hyperlipidemia; RBBB; ICD (implantable cardioverter-defibrillator), single, in situ; DM (diabetes mellitus) ([place]); and DVT (deep venous thrombosis) ([place]). Patient was seen in the ED is ago at that time there was a concern for right lower extremity cellulitis, patient was given a dose of vancomycin and Zosyn and then sent home on clindamycin. At that time blood cultures were drawn. Patient was doing fine at home when lab called and told that one out of 2 bottles of blood cultures was positive for gram-positive cocci in clusters and patient was instructed to come back to the ED. Patient says that he has been feeling better though he still feels pain in the leg while walking. He denies any fever, no chest pain or shortness of breath. No nausea vomiting or diarrhea. Patient has been seen by oncology for abnormal serum protein  electrophoresis.   Hospital Course:  Patient was admitted for cellulitis with positive Blood culture.  The original BCs were both positive for MSSA, but another set of BC done on admission remained negative to date.  His right lower extremity cellulitis was rather severe on admission.  He was given IV Vancomycin, and the cellulitis improved quickly and markedly.  ID was concerned about endocarditis, and recommended cardiology consultation to address the need for ICD removal, and TEE.  His Pradaxa was held, and he had TEE done which showed no vegetation.  Pradaxa was resumed after the procedure. Cardiology did not recommend any removal of the ICD.  He was transitioned to Ancef, but after discussing with ID, and in an effort to simplify his antibiotic regimen, he can be switched to once a day Rocephin at 2 grams per day.  He preferred to get his antibiotic outpatient at the hospital rather than at home.  A PICC line was placed, and management of PICC line protocol will be implemented.  He will complete a month of IV Rocephin as recommended by ID Dr Linus Salmons.  Subsequently, no repeat BCs are required.  He will follow up with his PCP next week.  Routine follow up labs will be done thru his PCP.  Thank you for allowing me to participate in the care of this nice patient.  Good Day.    Procedures:  TEE.   Consultations:  ID:  Dr Linus Salmons.  Cardiology:  Dr Wyvonna Plum  Discharge Exam: Filed Vitals:   01/12/15 0517  BP: 112/67  Pulse: 55  Temp: 98.4 F (36.9 C)  Resp: 16    Discharge Instructions   Discharge Instructions    Diet - low sodium heart healthy    Complete by:  As directed      Discharge instructions    Complete by:  As directed   Come to the hospital to get IV antibiotic daily for one month.  Follow up with your PCP next week.     Increase activity slowly    Complete by:  As directed           Current Discharge Medication List    START taking these medications   Details   cefTRIAXone 2 g in dextrose 5 % 50 mL Inject 2 g into the vein daily. Qty: 30 Dose, Refills: 0      CONTINUE these medications which have NOT CHANGED   Details  ALPRAZolam (XANAX) 1 MG tablet Take 1 mg by mouth daily as needed for anxiety or sleep.     amiodarone (PACERONE) 200 MG tablet Take 200 mg by mouth daily.    aspirin EC 81 MG tablet Take 81 mg by mouth at bedtime.    atorvastatin (LIPITOR) 40 MG tablet Take 40 mg by mouth daily.    digoxin (LANOXIN) 0.125 MG tablet Take 0.125 mg by mouth daily.     levothyroxine (SYNTHROID, LEVOTHROID) 112 MCG tablet Take 112 mcg by mouth daily before breakfast.    magnesium oxide (MAG-OX) 400 MG tablet Take 400 mg by mouth daily.    metoprolol (LOPRESSOR) 50 MG tablet TAKE 1 TABLET BY MOUTH TWICE DAILY. Qty: 180 tablet, Refills: 3    niacin (NIASPAN) 1000 MG CR tablet TAKE (1) TABLET BY MOUTH AT BEDTIME. Qty: 30 tablet, Refills: 11    oxyCODONE-acetaminophen (PERCOCET) 10-325 MG per tablet Take 1 tablet by mouth every 4 (four) hours as needed for pain.    pioglitazone (ACTOS) 45 MG tablet Take 45 mg by mouth daily.    PRADAXA 75 MG CAPS capsule TAKE 1 CAPSULE BY MOUTH EVERY 12 HOURS. Qty: 60 capsule, Refills: 6    ramipril (ALTACE) 2.5 MG capsule Take 2.5 mg by mouth at bedtime.     ZETIA 10 MG tablet TAKE 1 TABLET ONCE DAILY FOR CHOLESTEROL. Qty: 30 tablet, Refills: 11    zolpidem (AMBIEN) 10 MG tablet Take 10 mg by mouth at bedtime as needed for sleep.      STOP taking these medications     clindamycin (CLEOCIN) 300 MG capsule      nitroGLYCERIN (NITROSTAT) 0.4 MG SL tablet        No Known Allergies    The results of significant diagnostics from this hospitalization (including imaging, microbiology, ancillary and laboratory) are listed below for reference.    Significant Diagnostic Studies: Dg Chest 2 View  01/05/2015  CLINICAL DATA:  Fever, chills, shaking, fall risk, coronary disease post CABG, diabetes  mellitus, ischemic cardiomyopathy, hypertension, former smoker EXAM: CHEST  2 VIEW COMPARISON:  08/10/2014, 11/03/2013 FINDINGS: LEFT subclavian AICD leads project at RIGHT atrium, RIGHT ventricle and coronary sinus. Enlargement of cardiac silhouette post CABG. Atherosclerotic calcifications aorta. Emphysematous changes with minimal RIGHT basilar atelectasis. Question RIGHT nipple shadow Lungs otherwise clear. No pleural effusion or pneumothorax. Bones demineralized. IMPRESSION: Emphysematous changes with minimal RIGHT basilar atelectasis. Enlargement of cardiac silhouette post CABG and AICD. Question RIGHT nipple shadow ; repeat PA chest radiograph with nipple markers recommended to exclude pulmonary nodule. Electronically Signed   By: Lavonia Dana M.D.   On: 01/05/2015 12:04   Dg  Chest Port 1 View  01/10/2015  CLINICAL DATA:  Line placement. EXAM: PORTABLE CHEST 1 VIEW COMPARISON:  January 05, 2015. FINDINGS: The heart size and mediastinal contours are within normal limits. Both lungs are clear. No pneumothorax or pleural effusion is noted. Status post coronary artery bypass graft. Left-sided pacemaker is unchanged in position. Interval placement of right-sided PICC line with distal tip in the expected position of the SVC. The visualized skeletal structures are unremarkable. IMPRESSION: Interval placement of right-sided PICC line with distal tip in expected position of the SVC. No acute cardiopulmonary abnormality seen. Electronically Signed   By: Marijo Conception, M.D.   On: 01/10/2015 15:42    Microbiology: Recent Results (from the past 240 hour(s))  Blood culture (routine x 2)     Status: None   Collection Time: 01/05/15 10:57 AM  Result Value Ref Range Status   Specimen Description BLOOD LEFT ANTECUBITAL DRAWN BY RN  Final   Special Requests   Final    BOTTLES DRAWN AEROBIC AND ANAEROBIC AEB=6CC ANA=4CC   Culture NO GROWTH 5 DAYS  Final   Report Status 01/10/2015 FINAL  Final  Blood culture  (routine x 2)     Status: None   Collection Time: 01/05/15 11:11 AM  Result Value Ref Range Status   Specimen Description BLOOD RIGHT ANTECUBITAL  Final   Special Requests   Final    BOTTLES DRAWN AEROBIC AND ANAEROBIC AEB=12CC ANA=10CC   Culture  Setup Time   Final    GRAM POSITIVE COCCI IN CLUSTERS RECOVERED FROM THE ANAEROBIC BOTTLE Gram Stain Report Called to,Read Back By and Verified With: VVOHY,W., FLOW MANAGER, AT [place] ON 01/07/2015 BY BAUGHAM,M. Performed at [place]   Final    STAPHYLOCOCCUS AUREUS Performed at [facility]    Report Status 01/10/2015 FINAL  Final   Organism ID, Bacteria STAPHYLOCOCCUS AUREUS  Final      Susceptibility   Staphylococcus aureus - MIC*    CIPROFLOXACIN <=0.5 SENSITIVE Sensitive     ERYTHROMYCIN <=0.25 SENSITIVE Sensitive     GENTAMICIN <=0.5 SENSITIVE Sensitive     OXACILLIN 0.5 SENSITIVE Sensitive     TETRACYCLINE <=1 SENSITIVE Sensitive     VANCOMYCIN 1 SENSITIVE Sensitive     TRIMETH/SULFA <=10 SENSITIVE Sensitive     CLINDAMYCIN <=0.25 SENSITIVE Sensitive     RIFAMPIN <=0.5 SENSITIVE Sensitive     Inducible Clindamycin NEGATIVE Sensitive     * STAPHYLOCOCCUS AUREUS  Urine culture     Status: None   Collection Time: 01/05/15 12:45 PM  Result Value Ref Range Status   Specimen Description URINE, CLEAN CATCH  Final   Special Requests NONE  Final   Culture   Final    1,000 COLONIES/mL INSIGNIFICANT GROWTH Performed at [facility]    Report Status 01/07/2015 FINAL  Final  Blood culture (routine x 2)     Status: None   Collection Time: 01/07/15  9:29 PM  Result Value Ref Range Status   Specimen Description BLOOD LEFT ANTECUBITAL  Final   Special Requests BOTTLES DRAWN AEROBIC AND ANAEROBIC 6CC  Final   Culture NO GROWTH 5 DAYS  Final   Report Status 01/12/2015 FINAL  Final  Blood culture (routine x 2)     Status: None   Collection Time: 01/07/15  9:45 PM  Result Value Ref Range Status    Specimen Description BLOOD RIGHT ANTECUBITAL  Final   Special Requests BOTTLES DRAWN AEROBIC AND  ANAEROBIC [facility]  Final   Culture NO GROWTH 5 DAYS  Final   Report Status 01/12/2015 FINAL  Final     Labs: Basic Metabolic Panel:  Recent Labs Lab 01/07/15 2129 01/08/15 0534  NA 137 137  K 4.2 4.3  CL 101 102  CO2 29 29  GLUCOSE 105* 77  BUN 22* 19  CREATININE 1.49* 1.36*  CALCIUM 8.6* 8.3*   Liver Function Tests:  Recent Labs Lab 01/07/15 2129 01/08/15 0534  AST 51* 41  ALT 37 32  ALKPHOS 76 69  BILITOT 0.8 0.7  PROT 6.5 5.5*  ALBUMIN 3.1* 2.6*   CBC:  Recent Labs Lab 01/07/15 2129 01/08/15 0534 01/11/15 0530  WBC 7.5 5.7 6.0  NEUTROABS 5.0  --  3.2  HGB 9.7* 8.8* 9.2*  HCT 30.3* 27.4* 27.7*  MCV 102.7* 101.9* 100.4*  PLT 182 156 225   CBG:  Recent Labs Lab 01/11/15 1133 01/11/15 1527 01/11/15 1614 01/11/15 2056 01/12/15 0736  GLUCAP 92 98 101* 134* 76   Signed:  Greg Cratty  [facility] 01/12/2015, 11:01 AM

## 2015-01-13 ENCOUNTER — Encounter (HOSPITAL_COMMUNITY)
Admit: 2015-01-13 | Discharge: 2015-01-13 | Disposition: A | Discharge status: Home / Self Care | Payer: Medicare Other | Attending: Internal Medicine | Admitting: Internal Medicine

## 2015-01-13 DIAGNOSIS — Z452 Encounter for adjustment and management of vascular access device: Secondary | ICD-10-CM | POA: Insufficient documentation

## 2015-01-13 MED ORDER — DEXTROSE 5 % IV SOLN: 2.0000 g | Freq: Once | INTRAVENOUS | Status: DC

## 2015-01-14 ENCOUNTER — Encounter (HOSPITAL_COMMUNITY): Payer: Self-pay | Admitting: Cardiovascular Disease

## 2015-01-14 ENCOUNTER — Encounter (HOSPITAL_COMMUNITY)
Admit: 2015-01-14 | Discharge: 2015-01-14 | Disposition: A | Discharge status: Home / Self Care | Payer: Medicare Other | Attending: Internal Medicine | Admitting: Internal Medicine

## 2015-01-14 DIAGNOSIS — Z452 Encounter for adjustment and management of vascular access device: Secondary | ICD-10-CM | POA: Diagnosis not present

## 2015-01-14 MED ORDER — SODIUM CHLORIDE 0.9 % IV SOLN
Freq: Once | INTRAVENOUS | Status: AC
  Administered 2015-01-14: 250 mL via INTRAVENOUS

## 2015-01-14 MED ORDER — DEXTROSE 5 % IV SOLN
2.0000 g | Freq: Once | INTRAVENOUS | Status: AC
  Administered 2015-01-14: 2 g via INTRAVENOUS
  Filled 2015-01-14: qty 2

## 2015-01-15 ENCOUNTER — Encounter (HOSPITAL_COMMUNITY)
Admit: 2015-01-15 | Discharge: 2015-01-15 | Disposition: A | Discharge status: Home / Self Care | Payer: Medicare Other | Attending: Internal Medicine | Admitting: Internal Medicine

## 2015-01-15 DIAGNOSIS — Z452 Encounter for adjustment and management of vascular access device: Secondary | ICD-10-CM | POA: Insufficient documentation

## 2015-01-15 MED ORDER — DEXTROSE 5 % IV SOLN
INTRAVENOUS | Status: AC
  Filled 2015-01-15: qty 2

## 2015-01-15 MED ORDER — SODIUM CHLORIDE 0.9 % IV SOLN
Freq: Once | INTRAVENOUS | Status: AC
  Administered 2015-01-15: 200 mL via INTRAVENOUS

## 2015-01-15 MED ORDER — CEFTRIAXONE SODIUM 2 G IJ SOLR
2.0000 g | Freq: Once | INTRAMUSCULAR | Status: AC
Start: 2015-01-15 — End: 2015-01-15
  Administered 2015-01-15: 2 g via INTRAVENOUS

## 2015-01-16 ENCOUNTER — Encounter (HOSPITAL_COMMUNITY)
Admit: 2015-01-16 | Discharge: 2015-01-16 | Disposition: A | Discharge status: Home / Self Care | Payer: Medicare Other | Attending: Internal Medicine | Admitting: Internal Medicine

## 2015-01-16 DIAGNOSIS — Z452 Encounter for adjustment and management of vascular access device: Secondary | ICD-10-CM | POA: Diagnosis not present

## 2015-01-16 MED ORDER — DEXTROSE 5 % IV SOLN
2.0000 g | Freq: Once | INTRAVENOUS | Status: AC
  Administered 2015-01-16: 2 g via INTRAVENOUS
  Filled 2015-01-16: qty 2

## 2015-01-16 MED ORDER — SODIUM CHLORIDE 0.9 % IV SOLN
INTRAVENOUS | Status: DC
  Administered 2015-01-16: 13:00:00 via INTRAVENOUS

## 2015-01-17 ENCOUNTER — Encounter (HOSPITAL_COMMUNITY)
Admission: RE | Admit: 2015-01-17 | Discharge: 2015-01-17 | Disposition: A | Discharge status: Home / Self Care | Payer: Medicare Other | Source: Ambulatory Visit | Attending: Internal Medicine | Admitting: Internal Medicine

## 2015-01-17 ENCOUNTER — Encounter (HOSPITAL_COMMUNITY)
Admit: 2015-01-17 | Discharge: 2015-01-17 | Disposition: A | Discharge status: Home / Self Care | Payer: Medicare Other | Attending: Internal Medicine | Admitting: Internal Medicine

## 2015-01-17 DIAGNOSIS — Z452 Encounter for adjustment and management of vascular access device: Secondary | ICD-10-CM | POA: Diagnosis not present

## 2015-01-17 MED ORDER — SODIUM CHLORIDE 0.9 % IV SOLN
INTRAVENOUS | Status: DC
  Administered 2015-01-17: 13:00:00 via INTRAVENOUS

## 2015-01-17 MED ORDER — DEXTROSE 5 % IV SOLN
2.0000 g | Freq: Once | INTRAVENOUS | Status: AC
  Administered 2015-01-17: 2 g via INTRAVENOUS
  Filled 2015-01-17: qty 2

## 2015-01-18 ENCOUNTER — Encounter (HOSPITAL_COMMUNITY)
Admit: 2015-01-18 | Discharge: 2015-01-18 | Disposition: A | Discharge status: Home / Self Care | Payer: Medicare Other | Attending: Internal Medicine | Admitting: Internal Medicine

## 2015-01-18 DIAGNOSIS — Z452 Encounter for adjustment and management of vascular access device: Secondary | ICD-10-CM | POA: Diagnosis not present

## 2015-01-18 MED ORDER — SODIUM CHLORIDE 0.9 % IV SOLN
INTRAVENOUS | Status: DC
  Administered 2015-01-18: 250 mL via INTRAVENOUS

## 2015-01-18 MED ORDER — DEXTROSE 5 % IV SOLN: Freq: Once | INTRAVENOUS | Status: DC

## 2015-01-18 MED ORDER — DEXTROSE 5 % IV SOLN
2.0000 g | Freq: Once | INTRAVENOUS | Status: AC
  Administered 2015-01-18: 2 g via INTRAVENOUS

## 2015-01-18 MED ORDER — DEXTROSE 5 % IV SOLN
INTRAVENOUS | Status: AC
  Filled 2015-01-18: qty 2

## 2015-01-19 ENCOUNTER — Ambulatory Visit (HOSPITAL_COMMUNITY)
Admission: RE | Admit: 2015-01-19 | Discharge: 2015-01-19 | Disposition: A | Discharge status: Home / Self Care | Payer: Medicare Other | Source: Ambulatory Visit | Attending: Internal Medicine | Admitting: Internal Medicine

## 2015-01-19 ENCOUNTER — Emergency Department (HOSPITAL_COMMUNITY)
Admission: EM | Admit: 2015-01-19 | Discharge: 2015-01-19 | Discharge status: Absent without leave | Payer: Medicare Other

## 2015-01-19 DIAGNOSIS — Z452 Encounter for adjustment and management of vascular access device: Secondary | ICD-10-CM | POA: Diagnosis not present

## 2015-01-19 MED ORDER — DEXTROSE 5 % IV SOLN
2.0000 g | Freq: Once | INTRAVENOUS | Status: DC
  Filled 2015-01-19 (×2): qty 2

## 2015-01-19 MED ORDER — DEXTROSE 5 % IV SOLN: 2.0000 g | Freq: Once | INTRAVENOUS | Status: DC

## 2015-01-19 MED ORDER — HEPARIN SOD (PORK) LOCK FLUSH 100 UNIT/ML IV SOLN
500.0000 [IU] | Freq: Once | INTRAVENOUS | Status: DC
  Filled 2015-01-19: qty 5

## 2015-01-20 ENCOUNTER — Ambulatory Visit (HOSPITAL_COMMUNITY)
Admission: RE | Admit: 2015-01-20 | Discharge: 2015-01-20 | Disposition: A | Discharge status: Home / Self Care | Payer: Medicare Other | Source: Ambulatory Visit | Attending: Internal Medicine | Admitting: Internal Medicine

## 2015-01-20 DIAGNOSIS — Z452 Encounter for adjustment and management of vascular access device: Secondary | ICD-10-CM | POA: Diagnosis not present

## 2015-01-21 ENCOUNTER — Encounter (HOSPITAL_COMMUNITY)
Admission: RE | Admit: 2015-01-21 | Discharge: 2015-01-21 | Disposition: A | Discharge status: Home / Self Care | Payer: Medicare Other | Source: Ambulatory Visit | Attending: Internal Medicine | Admitting: Internal Medicine

## 2015-01-21 DIAGNOSIS — Z452 Encounter for adjustment and management of vascular access device: Secondary | ICD-10-CM | POA: Diagnosis not present

## 2015-01-21 MED ORDER — DEXTROSE 5 % IV SOLN: INTRAVENOUS | Status: DC

## 2015-01-21 MED ORDER — SODIUM CHLORIDE 0.9 % IJ SOLN: 10.0000 mL | INTRAMUSCULAR | Status: DC | PRN

## 2015-01-21 MED ORDER — DEXTROSE 5 % IV SOLN
2.0000 g | Freq: Once | INTRAVENOUS | Status: AC
  Administered 2015-01-21: 2 g via INTRAVENOUS

## 2015-01-21 MED ORDER — DEXTROSE 5 % IV SOLN
INTRAVENOUS | Status: AC
  Filled 2015-01-21: qty 2

## 2015-01-22 ENCOUNTER — Encounter (HOSPITAL_COMMUNITY)
Admission: RE | Admit: 2015-01-22 | Discharge: 2015-01-22 | Disposition: A | Discharge status: Home / Self Care | Payer: Medicare Other | Source: Ambulatory Visit | Attending: Internal Medicine | Admitting: Internal Medicine

## 2015-01-22 DIAGNOSIS — Z452 Encounter for adjustment and management of vascular access device: Secondary | ICD-10-CM | POA: Diagnosis not present

## 2015-01-22 MED ORDER — DEXTROSE 5 % IV SOLN
2.0000 g | Freq: Once | INTRAVENOUS | Status: AC
  Administered 2015-01-22: 2 g via INTRAVENOUS

## 2015-01-22 MED ORDER — SODIUM CHLORIDE 0.9 % IV SOLN
INTRAVENOUS | Status: DC
  Administered 2015-01-22: 250 mL via INTRAVENOUS

## 2015-01-22 MED ORDER — DEXTROSE 5 % IV SOLN
INTRAVENOUS | Status: AC
  Filled 2015-01-22: qty 2

## 2015-01-23 ENCOUNTER — Encounter (HOSPITAL_COMMUNITY)
Admission: RE | Admit: 2015-01-23 | Discharge: 2015-01-23 | Disposition: A | Discharge status: Home / Self Care | Payer: Medicare Other | Source: Ambulatory Visit | Attending: Internal Medicine | Admitting: Internal Medicine

## 2015-01-23 ENCOUNTER — Encounter (HOSPITAL_COMMUNITY): Admission: RE | Admit: 2015-01-23 | Payer: Medicare Other | Source: Ambulatory Visit

## 2015-01-23 DIAGNOSIS — Z452 Encounter for adjustment and management of vascular access device: Secondary | ICD-10-CM | POA: Diagnosis not present

## 2015-01-23 MED ORDER — DEXTROSE 5 % IV SOLN
INTRAVENOUS | Status: AC
  Filled 2015-01-23: qty 2

## 2015-01-23 MED ORDER — SODIUM CHLORIDE 0.9 % IV SOLN
Freq: Once | INTRAVENOUS | Status: AC
  Administered 2015-01-23: 250 mL via INTRAVENOUS

## 2015-01-23 MED ORDER — DEXTROSE 5 % IV SOLN
2.0000 g | Freq: Once | INTRAVENOUS | Status: AC
  Administered 2015-01-23: 2 g via INTRAVENOUS

## 2015-01-24 ENCOUNTER — Encounter (HOSPITAL_COMMUNITY)
Admission: RE | Admit: 2015-01-24 | Discharge: 2015-01-24 | Disposition: A | Discharge status: Home / Self Care | Payer: Medicare Other | Source: Ambulatory Visit | Attending: Internal Medicine | Admitting: Internal Medicine

## 2015-01-24 DIAGNOSIS — Z452 Encounter for adjustment and management of vascular access device: Secondary | ICD-10-CM | POA: Diagnosis not present

## 2015-01-24 MED ORDER — DEXTROSE 5 % IV SOLN
INTRAVENOUS | Status: AC
  Filled 2015-01-24: qty 2

## 2015-01-24 MED ORDER — SODIUM CHLORIDE 0.9 % IV SOLN: INTRAVENOUS | Status: DC

## 2015-01-24 MED ORDER — DEXTROSE 5 % IV SOLN
2.0000 g | Freq: Once | INTRAVENOUS | Status: AC
  Administered 2015-01-24: 2 g via INTRAVENOUS

## 2015-01-25 ENCOUNTER — Encounter (HOSPITAL_COMMUNITY)
Admission: RE | Admit: 2015-01-25 | Discharge: 2015-01-25 | Disposition: A | Discharge status: Home / Self Care | Payer: Medicare Other | Source: Ambulatory Visit | Attending: Internal Medicine | Admitting: Internal Medicine

## 2015-01-25 ENCOUNTER — Other Ambulatory Visit: Payer: Self-pay | Admitting: Cardiovascular Disease

## 2015-01-25 DIAGNOSIS — Z452 Encounter for adjustment and management of vascular access device: Secondary | ICD-10-CM | POA: Diagnosis not present

## 2015-01-25 MED ORDER — DEXTROSE 5 % IV SOLN
INTRAVENOUS | Status: AC
  Filled 2015-01-25: qty 2

## 2015-01-25 MED ORDER — DEXTROSE 5 % IV SOLN
2.0000 g | INTRAVENOUS | Status: DC
  Administered 2015-01-25: 2 g via INTRAVENOUS

## 2015-01-25 MED ORDER — HEPARIN SOD (PORK) LOCK FLUSH 100 UNIT/ML IV SOLN
INTRAVENOUS | Status: AC
  Filled 2015-01-25: qty 5

## 2015-01-25 MED ORDER — HEPARIN SOD (PORK) LOCK FLUSH 100 UNIT/ML IV SOLN
500.0000 [IU] | Freq: Once | INTRAVENOUS | Status: AC
  Administered 2015-01-25: 500 [IU] via INTRAVENOUS

## 2015-01-25 MED ORDER — SODIUM CHLORIDE 0.9 % IV SOLN: INTRAVENOUS | Status: DC

## 2015-01-26 ENCOUNTER — Encounter (HOSPITAL_COMMUNITY)
Admission: RE | Admit: 2015-01-26 | Discharge: 2015-01-26 | Disposition: A | Discharge status: Home / Self Care | Payer: Medicare Other | Source: Ambulatory Visit | Attending: Internal Medicine | Admitting: Internal Medicine

## 2015-01-26 DIAGNOSIS — Z452 Encounter for adjustment and management of vascular access device: Secondary | ICD-10-CM | POA: Diagnosis not present

## 2015-01-27 ENCOUNTER — Encounter (HOSPITAL_COMMUNITY)
Admission: RE | Admit: 2015-01-27 | Discharge: 2015-01-27 | Disposition: A | Discharge status: Home / Self Care | Payer: Medicare Other | Source: Ambulatory Visit | Attending: Internal Medicine | Admitting: Internal Medicine

## 2015-01-27 DIAGNOSIS — Z452 Encounter for adjustment and management of vascular access device: Secondary | ICD-10-CM | POA: Diagnosis not present

## 2015-01-28 ENCOUNTER — Encounter (HOSPITAL_COMMUNITY)
Admission: RE | Admit: 2015-01-28 | Discharge: 2015-01-28 | Disposition: A | Discharge status: Home / Self Care | Payer: Medicare Other | Source: Ambulatory Visit | Attending: Internal Medicine | Admitting: Internal Medicine

## 2015-01-28 DIAGNOSIS — Z452 Encounter for adjustment and management of vascular access device: Secondary | ICD-10-CM | POA: Diagnosis not present

## 2015-01-28 MED ORDER — DEXTROSE 5 % IV SOLN
2.0000 g | Freq: Once | INTRAVENOUS | Status: AC
  Administered 2015-01-28: 2 g via INTRAVENOUS

## 2015-01-28 MED ORDER — DEXTROSE 5 % IV SOLN
INTRAVENOUS | Status: AC
  Filled 2015-01-28: qty 2

## 2015-01-28 MED FILL — Ceftriaxone Sodium For Inj 2 GM: INTRAMUSCULAR | Qty: 2 | Status: AC

## 2015-01-28 MED FILL — Heparin Sodium (Porcine) Lock Flush IV Soln 100 Unit/ML: INTRAVENOUS | Qty: 5 | Status: AC

## 2015-01-29 ENCOUNTER — Encounter (HOSPITAL_COMMUNITY)
Admission: RE | Admit: 2015-01-29 | Discharge: 2015-01-29 | Disposition: A | Discharge status: Home / Self Care | Payer: Medicare Other | Source: Ambulatory Visit | Attending: Internal Medicine | Admitting: Internal Medicine

## 2015-01-29 ENCOUNTER — Encounter (HOSPITAL_COMMUNITY): Payer: Self-pay

## 2015-01-29 DIAGNOSIS — Z452 Encounter for adjustment and management of vascular access device: Secondary | ICD-10-CM | POA: Diagnosis not present

## 2015-01-29 MED ORDER — DEXTROSE 5 % IV SOLN
INTRAVENOUS | Status: AC
  Filled 2015-01-29: qty 2

## 2015-01-29 MED ORDER — SODIUM CHLORIDE 0.9 % IJ SOLN
10.0000 mL | Freq: Once | INTRAMUSCULAR | Status: AC
  Administered 2015-01-29: 10 mL via INTRAVENOUS

## 2015-01-29 MED ORDER — DEXTROSE 5 % IV SOLN
2.0000 g | Freq: Once | INTRAVENOUS | Status: AC
  Administered 2015-01-29: 2 g via INTRAVENOUS
  Filled 2015-01-29: qty 2

## 2015-01-30 ENCOUNTER — Encounter (HOSPITAL_COMMUNITY)
Admission: RE | Admit: 2015-01-30 | Discharge: 2015-01-30 | Disposition: A | Discharge status: Home / Self Care | Payer: Medicare Other | Source: Ambulatory Visit | Attending: Internal Medicine | Admitting: Internal Medicine

## 2015-01-30 DIAGNOSIS — Z452 Encounter for adjustment and management of vascular access device: Secondary | ICD-10-CM | POA: Diagnosis not present

## 2015-01-30 MED ORDER — CEFTRIAXONE SODIUM 2 G IJ SOLR
INTRAMUSCULAR | Status: AC
  Filled 2015-01-30: qty 2

## 2015-01-30 MED ORDER — SODIUM CHLORIDE 0.9 % IV SOLN
Freq: Once | INTRAVENOUS | Status: AC
  Administered 2015-01-30: 13:00:00 via INTRAVENOUS

## 2015-01-30 MED ORDER — DEXTROSE 5 % IV SOLN
2.0000 g | INTRAVENOUS | Status: DC
  Administered 2015-01-30: 2 g via INTRAVENOUS

## 2015-01-31 ENCOUNTER — Encounter (HOSPITAL_COMMUNITY): Payer: Self-pay

## 2015-01-31 ENCOUNTER — Encounter (HOSPITAL_COMMUNITY)
Admission: RE | Admit: 2015-01-31 | Discharge: 2015-01-31 | Disposition: A | Discharge status: Home / Self Care | Payer: Medicare Other | Source: Ambulatory Visit | Attending: Internal Medicine | Admitting: Internal Medicine

## 2015-01-31 DIAGNOSIS — Z452 Encounter for adjustment and management of vascular access device: Secondary | ICD-10-CM | POA: Diagnosis not present

## 2015-01-31 MED ORDER — SODIUM CHLORIDE 0.9 % IV SOLN
Freq: Once | INTRAVENOUS | Status: AC
  Administered 2015-01-31: 250 mL via INTRAVENOUS

## 2015-01-31 MED ORDER — DEXTROSE 5 % IV SOLN
INTRAVENOUS | Status: AC
  Filled 2015-01-31: qty 2

## 2015-01-31 MED ORDER — DEXTROSE 5 % IV SOLN
2.0000 g | Freq: Once | INTRAVENOUS | Status: AC
  Administered 2015-01-31: 2 g via INTRAVENOUS

## 2015-02-01 ENCOUNTER — Encounter (HOSPITAL_COMMUNITY)
Admission: RE | Admit: 2015-02-01 | Discharge: 2015-02-01 | Disposition: A | Discharge status: Home / Self Care | Payer: Medicare Other | Source: Ambulatory Visit | Attending: Internal Medicine | Admitting: Internal Medicine

## 2015-02-01 DIAGNOSIS — Z452 Encounter for adjustment and management of vascular access device: Secondary | ICD-10-CM | POA: Diagnosis not present

## 2015-02-01 MED ORDER — SODIUM CHLORIDE 0.9 % IV SOLN
INTRAVENOUS | Status: DC
  Administered 2015-02-01: 12:00:00 via INTRAVENOUS

## 2015-02-01 MED ORDER — DEXTROSE 5 % IV SOLN
INTRAVENOUS | Status: AC
  Filled 2015-02-01: qty 2

## 2015-02-01 MED ORDER — SODIUM CHLORIDE 0.9 % IJ SOLN
INTRAMUSCULAR | Status: AC
  Filled 2015-02-01: qty 6

## 2015-02-01 MED ORDER — DEXTROSE 5 % IV SOLN
2.0000 g | Freq: Once | INTRAVENOUS | Status: AC
  Administered 2015-02-01: 2 g via INTRAVENOUS

## 2015-02-02 ENCOUNTER — Encounter (HOSPITAL_COMMUNITY)
Admission: RE | Admit: 2015-02-02 | Discharge: 2015-02-02 | Disposition: A | Discharge status: Home / Self Care | Payer: Medicare Other | Source: Ambulatory Visit | Attending: Internal Medicine | Admitting: Internal Medicine

## 2015-02-02 DIAGNOSIS — Z452 Encounter for adjustment and management of vascular access device: Secondary | ICD-10-CM | POA: Diagnosis not present

## 2015-02-02 MED ORDER — DEXTROSE 5 % IV SOLN
2.0000 g | Freq: Once | INTRAVENOUS | Status: DC
  Filled 2015-02-02: qty 2

## 2015-02-02 MED ORDER — HEPARIN SOD (PORK) LOCK FLUSH 100 UNIT/ML IV SOLN
500.0000 [IU] | Freq: Once | INTRAVENOUS | Status: DC
Start: 2015-02-02 — End: 2015-02-03
  Filled 2015-02-02: qty 5

## 2015-02-03 ENCOUNTER — Encounter (HOSPITAL_COMMUNITY)
Admission: RE | Admit: 2015-02-03 | Discharge: 2015-02-03 | Disposition: A | Discharge status: Home / Self Care | Payer: Medicare Other | Source: Ambulatory Visit | Attending: Internal Medicine | Admitting: Internal Medicine

## 2015-02-03 DIAGNOSIS — Z452 Encounter for adjustment and management of vascular access device: Secondary | ICD-10-CM | POA: Diagnosis not present

## 2015-02-03 MED ORDER — HEPARIN SOD (PORK) LOCK FLUSH 100 UNIT/ML IV SOLN
500.0000 [IU] | Freq: Once | INTRAVENOUS | Status: DC
  Filled 2015-02-03: qty 5

## 2015-02-03 MED ORDER — DEXTROSE 5 % IV SOLN
2.0000 g | Freq: Once | INTRAVENOUS | Status: DC
  Filled 2015-02-03: qty 2

## 2015-02-04 ENCOUNTER — Encounter (HOSPITAL_COMMUNITY)
Admission: RE | Admit: 2015-02-04 | Discharge: 2015-02-04 | Disposition: A | Discharge status: Home / Self Care | Payer: Medicare Other | Source: Ambulatory Visit | Attending: Internal Medicine | Admitting: Internal Medicine

## 2015-02-04 DIAGNOSIS — Z452 Encounter for adjustment and management of vascular access device: Secondary | ICD-10-CM | POA: Diagnosis not present

## 2015-02-04 MED ORDER — DEXTROSE 5 % IV SOLN
2.0000 g | Freq: Once | INTRAVENOUS | Status: AC
  Administered 2015-02-04: 2 g via INTRAVENOUS

## 2015-02-04 MED ORDER — SODIUM CHLORIDE 0.9 % IV SOLN
INTRAVENOUS | Status: DC
  Administered 2015-02-04: 250 mL via INTRAVENOUS

## 2015-02-05 ENCOUNTER — Encounter (HOSPITAL_COMMUNITY)
Admission: RE | Admit: 2015-02-05 | Discharge: 2015-02-05 | Disposition: A | Discharge status: Home / Self Care | Payer: Medicare Other | Source: Ambulatory Visit | Attending: Internal Medicine | Admitting: Internal Medicine

## 2015-02-05 DIAGNOSIS — Z452 Encounter for adjustment and management of vascular access device: Secondary | ICD-10-CM | POA: Diagnosis not present

## 2015-02-05 MED ORDER — DEXTROSE 5 % IV SOLN
INTRAVENOUS | Status: AC
  Filled 2015-02-05: qty 2

## 2015-02-05 MED ORDER — DEXTROSE 5 % IV SOLN
2.0000 g | Freq: Once | INTRAVENOUS | Status: AC
  Administered 2015-02-05: 2 g via INTRAVENOUS

## 2015-02-05 MED ORDER — SODIUM CHLORIDE 0.9 % IV SOLN
INTRAVENOUS | Status: DC
  Administered 2015-02-05: 250 mL via INTRAVENOUS

## 2015-02-06 ENCOUNTER — Encounter (HOSPITAL_COMMUNITY)
Admission: RE | Admit: 2015-02-06 | Discharge: 2015-02-06 | Disposition: A | Discharge status: Home / Self Care | Payer: Medicare Other | Source: Ambulatory Visit | Attending: Internal Medicine | Admitting: Internal Medicine

## 2015-02-06 DIAGNOSIS — Z452 Encounter for adjustment and management of vascular access device: Secondary | ICD-10-CM | POA: Diagnosis not present

## 2015-02-06 MED ORDER — DEXTROSE 5 % IV SOLN
2.0000 g | Freq: Once | INTRAVENOUS | Status: AC
  Administered 2015-02-06: 2 g via INTRAVENOUS

## 2015-02-06 MED ORDER — SODIUM CHLORIDE 0.9 % IV SOLN: INTRAVENOUS | Status: DC

## 2015-02-06 MED ORDER — DEXTROSE 5 % IV SOLN
INTRAVENOUS | Status: AC
  Filled 2015-02-06: qty 2

## 2015-02-07 ENCOUNTER — Encounter (HOSPITAL_COMMUNITY)
Admission: RE | Admit: 2015-02-07 | Discharge: 2015-02-07 | Disposition: A | Discharge status: Home / Self Care | Payer: Medicare Other | Source: Ambulatory Visit | Attending: Internal Medicine | Admitting: Internal Medicine

## 2015-02-07 DIAGNOSIS — Z452 Encounter for adjustment and management of vascular access device: Secondary | ICD-10-CM | POA: Diagnosis not present

## 2015-02-07 MED ORDER — DEXTROSE 5 % IV SOLN
2.0000 g | Freq: Once | INTRAVENOUS | Status: DC
  Filled 2015-02-07: qty 2

## 2015-02-08 ENCOUNTER — Encounter (HOSPITAL_COMMUNITY)
Admission: RE | Admit: 2015-02-08 | Discharge: 2015-02-08 | Disposition: A | Discharge status: Home / Self Care | Payer: Medicare Other | Source: Ambulatory Visit | Attending: Internal Medicine | Admitting: Internal Medicine

## 2015-02-08 DIAGNOSIS — Z452 Encounter for adjustment and management of vascular access device: Secondary | ICD-10-CM | POA: Diagnosis not present

## 2015-02-08 MED ORDER — DEXTROSE 5 % IV SOLN
2.0000 g | Freq: Once | INTRAVENOUS | Status: DC
  Filled 2015-02-08: qty 2

## 2015-02-09 ENCOUNTER — Encounter (HOSPITAL_COMMUNITY)
Admission: RE | Admit: 2015-02-09 | Discharge: 2015-02-09 | Disposition: A | Discharge status: Home / Self Care | Payer: Medicare Other | Source: Ambulatory Visit | Attending: Internal Medicine | Admitting: Internal Medicine

## 2015-02-09 DIAGNOSIS — Z452 Encounter for adjustment and management of vascular access device: Secondary | ICD-10-CM | POA: Diagnosis not present

## 2015-02-09 MED ORDER — DEXTROSE 5 % IV SOLN
2.0000 g | Freq: Once | INTRAVENOUS | Status: AC
  Administered 2015-02-11: 2 g via INTRAVENOUS
  Filled 2015-02-09 (×2): qty 2

## 2015-02-10 ENCOUNTER — Encounter (HOSPITAL_COMMUNITY)
Admission: RE | Admit: 2015-02-10 | Discharge: 2015-02-10 | Disposition: A | Discharge status: Home / Self Care | Payer: Medicare Other | Source: Ambulatory Visit | Attending: Internal Medicine | Admitting: Internal Medicine

## 2015-02-10 DIAGNOSIS — Z452 Encounter for adjustment and management of vascular access device: Secondary | ICD-10-CM | POA: Diagnosis not present

## 2015-02-11 ENCOUNTER — Encounter (HOSPITAL_COMMUNITY)
Admission: RE | Admit: 2015-02-11 | Discharge: 2015-02-11 | Disposition: A | Discharge status: Home / Self Care | Payer: Medicare Other | Source: Ambulatory Visit | Attending: Internal Medicine | Admitting: Internal Medicine

## 2015-02-11 ENCOUNTER — Encounter (HOSPITAL_COMMUNITY): Payer: Self-pay

## 2015-02-11 DIAGNOSIS — Z452 Encounter for adjustment and management of vascular access device: Secondary | ICD-10-CM | POA: Diagnosis not present

## 2015-02-11 MED ORDER — DEXTROSE 5 % IV SOLN
INTRAVENOUS | Status: AC
  Filled 2015-02-11: qty 2

## 2015-02-11 MED ORDER — SODIUM CHLORIDE 0.9 % IV SOLN
Freq: Once | INTRAVENOUS | Status: AC
  Administered 2015-02-11: 13:00:00 via INTRAVENOUS

## 2015-02-12 ENCOUNTER — Encounter (HOSPITAL_COMMUNITY)
Admission: RE | Admit: 2015-02-12 | Discharge: 2015-02-12 | Disposition: A | Discharge status: Home / Self Care | Payer: Medicare Other | Source: Ambulatory Visit | Attending: Internal Medicine | Admitting: Internal Medicine

## 2015-02-12 ENCOUNTER — Encounter (HOSPITAL_COMMUNITY): Payer: Self-pay

## 2015-02-12 DIAGNOSIS — Z452 Encounter for adjustment and management of vascular access device: Secondary | ICD-10-CM | POA: Diagnosis not present

## 2015-02-12 MED ORDER — DEXTROSE 5 % IV SOLN
2.0000 g | Freq: Once | INTRAVENOUS | Status: AC
  Administered 2015-02-12: 2 g via INTRAVENOUS

## 2015-02-12 MED ORDER — SODIUM CHLORIDE 0.9 % IV SOLN
Freq: Once | INTRAVENOUS | Status: AC
  Administered 2015-02-12: 250 mL via INTRAVENOUS

## 2015-02-12 MED ORDER — DEXTROSE 5 % IV SOLN
INTRAVENOUS | Status: AC
  Filled 2015-02-12: qty 2

## 2015-02-13 ENCOUNTER — Encounter (HOSPITAL_COMMUNITY)
Admission: RE | Admit: 2015-02-13 | Discharge: 2015-02-13 | Disposition: A | Discharge status: Home / Self Care | Payer: Medicare Other | Source: Ambulatory Visit | Attending: Internal Medicine | Admitting: Internal Medicine

## 2015-02-13 DIAGNOSIS — Z452 Encounter for adjustment and management of vascular access device: Secondary | ICD-10-CM | POA: Diagnosis not present

## 2015-02-13 MED ORDER — SODIUM CHLORIDE 0.9 % IV SOLN
INTRAVENOUS | Status: DC
  Administered 2015-02-13: 250 mL via INTRAVENOUS

## 2015-02-13 MED ORDER — DEXTROSE 5 % IV SOLN
INTRAVENOUS | Status: AC
  Filled 2015-02-13: qty 2

## 2015-02-13 MED ORDER — DEXTROSE 5 % IV SOLN
2.0000 g | Freq: Once | INTRAVENOUS | Status: AC
  Administered 2015-02-13: 2 g via INTRAVENOUS

## 2015-02-14 ENCOUNTER — Encounter (HOSPITAL_COMMUNITY): Admission: RE | Admit: 2015-02-14 | Payer: Medicare Other | Source: Ambulatory Visit

## 2015-02-15 ENCOUNTER — Encounter (HOSPITAL_COMMUNITY)
Admission: RE | Admit: 2015-02-15 | Discharge: 2015-02-15 | Disposition: A | Discharge status: Home / Self Care | Payer: Medicare Other | Source: Ambulatory Visit | Attending: Internal Medicine | Admitting: Internal Medicine

## 2015-02-15 DIAGNOSIS — Z452 Encounter for adjustment and management of vascular access device: Secondary | ICD-10-CM | POA: Diagnosis not present

## 2015-02-15 MED ORDER — SODIUM CHLORIDE 0.9 % IV SOLN
Freq: Once | INTRAVENOUS | Status: AC
  Administered 2015-02-15: 250 mL via INTRAVENOUS

## 2015-02-15 MED ORDER — DEXTROSE 5 % IV SOLN
2.0000 g | Freq: Once | INTRAVENOUS | Status: AC
  Administered 2015-02-15: 2 g via INTRAVENOUS

## 2015-02-15 MED ORDER — DEXTROSE 5 % IV SOLN
INTRAVENOUS | Status: AC
  Filled 2015-02-15: qty 2

## 2015-02-16 ENCOUNTER — Encounter (HOSPITAL_COMMUNITY)
Admission: RE | Admit: 2015-02-16 | Discharge: 2015-02-16 | Disposition: A | Discharge status: Home / Self Care | Payer: Medicare Other | Source: Ambulatory Visit | Attending: Internal Medicine | Admitting: Internal Medicine

## 2015-02-16 DIAGNOSIS — Z452 Encounter for adjustment and management of vascular access device: Secondary | ICD-10-CM | POA: Diagnosis not present

## 2015-02-16 MED ORDER — CEFTRIAXONE SODIUM 2 G IJ SOLR
2.0000 g | Freq: Once | INTRAMUSCULAR | Status: DC
  Filled 2015-02-16 (×2): qty 2

## 2015-02-17 ENCOUNTER — Encounter (HOSPITAL_COMMUNITY)
Admission: RE | Admit: 2015-02-17 | Discharge: 2015-02-17 | Disposition: A | Discharge status: Home / Self Care | Payer: Medicare Other | Source: Ambulatory Visit | Attending: Internal Medicine | Admitting: Internal Medicine

## 2015-02-17 DIAGNOSIS — Z452 Encounter for adjustment and management of vascular access device: Secondary | ICD-10-CM | POA: Diagnosis not present

## 2015-02-18 DIAGNOSIS — A419 Sepsis, unspecified organism: Secondary | ICD-10-CM | POA: Diagnosis not present

## 2015-02-18 DIAGNOSIS — I10 Essential (primary) hypertension: Secondary | ICD-10-CM | POA: Diagnosis not present

## 2015-02-18 DIAGNOSIS — G894 Chronic pain syndrome: Secondary | ICD-10-CM | POA: Diagnosis not present

## 2015-02-18 DIAGNOSIS — I4891 Unspecified atrial fibrillation: Secondary | ICD-10-CM | POA: Diagnosis not present

## 2015-02-18 DIAGNOSIS — Z6825 Body mass index (BMI) 25.0-25.9, adult: Secondary | ICD-10-CM | POA: Diagnosis not present

## 2015-02-18 DIAGNOSIS — E782 Mixed hyperlipidemia: Secondary | ICD-10-CM | POA: Diagnosis not present

## 2015-02-18 DIAGNOSIS — L03119 Cellulitis of unspecified part of limb: Secondary | ICD-10-CM | POA: Diagnosis not present

## 2015-02-18 DIAGNOSIS — E663 Overweight: Secondary | ICD-10-CM | POA: Diagnosis not present

## 2015-02-18 DIAGNOSIS — E1129 Type 2 diabetes mellitus with other diabetic kidney complication: Secondary | ICD-10-CM | POA: Diagnosis not present

## 2015-02-18 DIAGNOSIS — Z1389 Encounter for screening for other disorder: Secondary | ICD-10-CM | POA: Diagnosis not present

## 2015-02-20 ENCOUNTER — Encounter (HOSPITAL_COMMUNITY)
Admission: RE | Admit: 2015-02-20 | Discharge: 2015-02-20 | Disposition: A | Discharge status: Home / Self Care | Payer: Medicare Other | Source: Ambulatory Visit | Attending: Internal Medicine | Admitting: Internal Medicine

## 2015-02-20 DIAGNOSIS — Z452 Encounter for adjustment and management of vascular access device: Secondary | ICD-10-CM | POA: Diagnosis not present

## 2015-02-21 MED FILL — Heparin Sodium (Porcine) Lock Flush IV Soln 100 Unit/ML: INTRAVENOUS | Qty: 5 | Status: AC

## 2015-02-21 MED FILL — Ceftriaxone Sodium For Inj 2 GM: INTRAMUSCULAR | Qty: 2 | Status: AC

## 2015-02-21 NOTE — Addendum Note (Signed)
Encounter addended by: Pricilla Larsson, [place] on: 02/21/2015  9:37 AM<BR>     Documentation filed: Rx [employer]

## 2015-03-03 ENCOUNTER — Emergency Department (HOSPITAL_COMMUNITY)
Admission: EM | Admit: 2015-03-03 | Discharge: 2015-03-03 | Disposition: A | Discharge status: Home / Self Care | Payer: Medicare Other | Attending: Emergency Medicine | Admitting: Emergency Medicine

## 2015-03-03 ENCOUNTER — Emergency Department (HOSPITAL_COMMUNITY): Payer: Medicare Other

## 2015-03-03 ENCOUNTER — Encounter (HOSPITAL_COMMUNITY): Payer: Self-pay | Admitting: *Deleted

## 2015-03-03 DIAGNOSIS — S0083XA Contusion of other part of head, initial encounter: Secondary | ICD-10-CM | POA: Diagnosis not present

## 2015-03-03 DIAGNOSIS — R011 Cardiac murmur, unspecified: Secondary | ICD-10-CM | POA: Diagnosis not present

## 2015-03-03 DIAGNOSIS — Z23 Encounter for immunization: Secondary | ICD-10-CM | POA: Diagnosis not present

## 2015-03-03 DIAGNOSIS — W19XXXA Unspecified fall, initial encounter: Secondary | ICD-10-CM

## 2015-03-03 DIAGNOSIS — Z86718 Personal history of other venous thrombosis and embolism: Secondary | ICD-10-CM | POA: Diagnosis not present

## 2015-03-03 DIAGNOSIS — Y998 Other external cause status: Secondary | ICD-10-CM | POA: Insufficient documentation

## 2015-03-03 DIAGNOSIS — M25522 Pain in left elbow: Secondary | ICD-10-CM | POA: Diagnosis not present

## 2015-03-03 DIAGNOSIS — Y92002 Bathroom of unspecified non-institutional (private) residence single-family (private) house as the place of occurrence of the external cause: Secondary | ICD-10-CM | POA: Diagnosis not present

## 2015-03-03 DIAGNOSIS — Z951 Presence of aortocoronary bypass graft: Secondary | ICD-10-CM | POA: Diagnosis not present

## 2015-03-03 DIAGNOSIS — Z87891 Personal history of nicotine dependence: Secondary | ICD-10-CM | POA: Diagnosis not present

## 2015-03-03 DIAGNOSIS — I2510 Atherosclerotic heart disease of native coronary artery without angina pectoris: Secondary | ICD-10-CM | POA: Diagnosis not present

## 2015-03-03 DIAGNOSIS — S0990XA Unspecified injury of head, initial encounter: Secondary | ICD-10-CM | POA: Diagnosis present

## 2015-03-03 DIAGNOSIS — W1839XA Other fall on same level, initial encounter: Secondary | ICD-10-CM | POA: Diagnosis not present

## 2015-03-03 DIAGNOSIS — E119 Type 2 diabetes mellitus without complications: Secondary | ICD-10-CM | POA: Diagnosis not present

## 2015-03-03 DIAGNOSIS — Z7982 Long term (current) use of aspirin: Secondary | ICD-10-CM | POA: Insufficient documentation

## 2015-03-03 DIAGNOSIS — Y9389 Activity, other specified: Secondary | ICD-10-CM | POA: Insufficient documentation

## 2015-03-03 DIAGNOSIS — R42 Dizziness and giddiness: Secondary | ICD-10-CM | POA: Insufficient documentation

## 2015-03-03 DIAGNOSIS — E785 Hyperlipidemia, unspecified: Secondary | ICD-10-CM | POA: Insufficient documentation

## 2015-03-03 DIAGNOSIS — Z79899 Other long term (current) drug therapy: Secondary | ICD-10-CM | POA: Diagnosis not present

## 2015-03-03 DIAGNOSIS — I10 Essential (primary) hypertension: Secondary | ICD-10-CM | POA: Diagnosis not present

## 2015-03-03 DIAGNOSIS — S51012A Laceration without foreign body of left elbow, initial encounter: Secondary | ICD-10-CM | POA: Insufficient documentation

## 2015-03-03 DIAGNOSIS — S199XXA Unspecified injury of neck, initial encounter: Secondary | ICD-10-CM | POA: Diagnosis not present

## 2015-03-03 LAB — URINALYSIS, ROUTINE W REFLEX MICROSCOPIC
BILIRUBIN URINE: NEGATIVE
Glucose, UA: NEGATIVE mg/dL
Hgb urine dipstick: NEGATIVE
KETONES UR: NEGATIVE mg/dL
Leukocytes, UA: NEGATIVE
NITRITE: NEGATIVE
PROTEIN: NEGATIVE mg/dL
SPECIFIC GRAVITY, URINE: 1.015 (ref 1.005–1.030)
pH: 5.5 (ref 5.0–8.0)

## 2015-03-03 LAB — CBC WITH DIFFERENTIAL/PLATELET
BASOS ABS: 0 10*3/uL (ref 0.0–0.1)
BASOS PCT: 1 %
EOS ABS: 0.3 10*3/uL (ref 0.0–0.7)
EOS PCT: 5 %
HCT: 32.7 % — ABNORMAL LOW (ref 39.0–52.0)
HEMOGLOBIN: 10.4 g/dL — AB (ref 13.0–17.0)
LYMPHS ABS: 2.1 10*3/uL (ref 0.7–4.0)
Lymphocytes Relative: 39 %
MCH: 32.2 pg (ref 26.0–34.0)
MCHC: 31.8 g/dL (ref 30.0–36.0)
MCV: 101.2 fL — ABNORMAL HIGH (ref 78.0–100.0)
Monocytes Absolute: 0.5 10*3/uL (ref 0.1–1.0)
Monocytes Relative: 9 %
NEUTROS PCT: 48 %
Neutro Abs: 2.7 10*3/uL (ref 1.7–7.7)
PLATELETS: 163 10*3/uL (ref 150–400)
RBC: 3.23 MIL/uL — AB (ref 4.22–5.81)
RDW: 15.1 % (ref 11.5–15.5)
WBC: 5.6 10*3/uL (ref 4.0–10.5)

## 2015-03-03 LAB — BASIC METABOLIC PANEL
Anion gap: 9 (ref 5–15)
BUN: 19 mg/dL (ref 6–20)
CHLORIDE: 100 mmol/L — AB (ref 101–111)
CO2: 30 mmol/L (ref 22–32)
Calcium: 8.8 mg/dL — ABNORMAL LOW (ref 8.9–10.3)
Creatinine, Ser: 1.58 mg/dL — ABNORMAL HIGH (ref 0.61–1.24)
GFR, EST AFRICAN AMERICAN: 49 mL/min — AB (ref >60)
GFR, EST NON AFRICAN AMERICAN: 43 mL/min — AB (ref >60)
Glucose, Bld: 95 mg/dL (ref 65–99)
POTASSIUM: 3.9 mmol/L (ref 3.5–5.1)
SODIUM: 139 mmol/L (ref 135–145)

## 2015-03-03 LAB — DIGOXIN LEVEL: DIGOXIN LVL: 1 ng/mL (ref 0.8–2.0)

## 2015-03-03 MED ORDER — BACITRACIN ZINC 500 UNIT/GM EX OINT
TOPICAL_OINTMENT | CUTANEOUS | Status: AC
  Administered 2015-03-03: 1
  Filled 2015-03-03: qty 0.9

## 2015-03-03 MED ORDER — BACITRACIN ZINC 500 UNIT/GM EX OINT
TOPICAL_OINTMENT | CUTANEOUS | Status: AC
  Administered 2015-03-03: 2
  Filled 2015-03-03: qty 1.8

## 2015-03-03 MED ORDER — TETANUS-DIPHTH-ACELL PERTUSSIS 5-2.5-18.5 LF-MCG/0.5 IM SUSP
0.5000 mL | Freq: Once | INTRAMUSCULAR | Status: AC
  Administered 2015-03-03: 0.5 mL via INTRAMUSCULAR
  Filled 2015-03-03: qty 0.5

## 2015-03-03 NOTE — Discharge Instructions (Signed)
Head Injury, Adult Follow up with Dr. Gerarda Fraction.  Return to the ED with confusion, worsening headache, vomiting, or any other concerns. Follow up with Dr. Gwenlyn Found because your AICD battery life is nearing replacement. You have received a head injury. It does not appear serious at this time. Headaches and vomiting are common following head injury. It should be easy to awaken from sleeping. Sometimes it is necessary for you to stay in the emergency department for a while for observation. Sometimes admission to the hospital may be needed. After injuries such as yours, most problems occur within the first 24 hours, but side effects may occur up to 7-10 days after the injury. It is important for you to carefully monitor your condition and contact your health care provider or seek immediate medical care if there is a change in your condition. WHAT ARE THE TYPES OF HEAD INJURIES? Head injuries can be as minor as a bump. Some head injuries can be more severe. More severe head injuries include:  A jarring injury to the brain (concussion).  A bruise of the brain (contusion). This mean there is bleeding in the brain that can cause swelling.  A cracked skull (skull fracture).  Bleeding in the brain that collects, clots, and forms a bump (hematoma). WHAT CAUSES A HEAD INJURY? A serious head injury is most likely to happen to someone who is in a car wreck and is not wearing a seat belt. Other causes of major head injuries include bicycle or motorcycle accidents, sports injuries, and falls. HOW ARE HEAD INJURIES DIAGNOSED? A complete history of the event leading to the injury and your current symptoms will be helpful in diagnosing head injuries. Many times, pictures of the brain, such as CT or MRI are needed to see the extent of the injury. Often, an overnight hospital stay is necessary for observation.  WHEN SHOULD I SEEK IMMEDIATE MEDICAL CARE?  You should get help right away if:  You have confusion or  drowsiness.  You feel sick to your stomach (nauseous) or have continued, forceful vomiting.  You have dizziness or unsteadiness that is getting worse.  You have severe, continued headaches not relieved by medicine. Only take over-the-counter or prescription medicines for pain, fever, or discomfort as directed by your health care provider.  You do not have normal function of the arms or legs or are unable to walk.  You notice changes in the black spots in the center of the colored part of your eye (pupil).  You have a clear or bloody fluid coming from your nose or ears.  You have a loss of vision. During the next 24 hours after the injury, you must stay with someone who can watch you for the warning signs. This person should contact local emergency services (911 in the [place] if you have seizures, you become unconscious, or you are unable to wake up. HOW CAN I PREVENT A HEAD INJURY IN THE FUTURE? The most important factor for preventing major head injuries is avoiding motor vehicle accidents. To minimize the potential for damage to your head, it is crucial to wear seat belts while riding in motor vehicles. Wearing helmets while bike riding and playing collision sports (like football) is also helpful. Also, avoiding dangerous activities around the house will further help reduce your risk of head injury.  WHEN CAN I RETURN TO NORMAL ACTIVITIES AND ATHLETICS? You should be reevaluated by your health care provider before returning to these activities. If you have any of the following  symptoms, you should not return to activities or contact sports until 1 week after the symptoms have stopped:  Persistent headache.  Dizziness or vertigo.  Poor attention and concentration.  Confusion.  Memory problems.  Nausea or vomiting.  Fatigue or tire easily.  Irritability.  Intolerant of bright lights or loud noises.  Anxiety or depression.  Disturbed sleep. MAKE SURE YOU:   Understand these  instructions.  Will watch your condition.  Will get help right away if you are not doing well or get worse.   This information is not intended to replace advice given to you by your health care provider. Make sure you discuss any questions you have with your health care provider.   Document Released: 03/02/2005 Document Revised: 03/23/2014 Document Reviewed: 11/07/2012 Elsevier Interactive Patient Education [employer].

## 2015-03-03 NOTE — ED Provider Notes (Signed)
CSN: [id number]     Arrival date & time 03/03/15  0610 History   First MD Initiated Contact with Patient 03/03/15 [phone]     Chief Complaint  Patient presents with  . Fall     (Consider location/radiation/quality/duration/timing/severity/associated sxs/prior Treatment) HPI  This is a 69 year old male with a history of ischemic cardiomyopathy, coronary artery disease, hypertension, diabetes who presents following a fall. Patient reports that he had an onset of room spinning dizziness and fell while he was in the bathroom. Reports a six-month history of "inner ear problems." He does not take anything for this. He currently denies dizziness. He states that his inner ear problems have gotten worse recently. He fell and hit his head. He is on Pradaxa. Also reports an injury to the left arm. Denies loss of consciousness. Has been ambulatory.  Patient recently finished a course of antibiotics for bacteremia. PICC line was pulled on Monday.  Past Medical History  Diagnosis Date  . Ischemic cardiomyopathy   . CAD (coronary artery disease)   . S/P CABG x 5   . PAF (paroxysmal atrial fibrillation) ([place])   . Hypertension   . Hyperlipidemia   . RBBB   . ICD (implantable cardioverter-defibrillator), single, in situ   . DM (diabetes mellitus) ([place])     type 2   . DVT (deep venous thrombosis) [facility])    Past Surgical History  Procedure Laterality Date  . Coronary artery bypass graft  12/31/1995    LIMA to LAD,SVG to intermediate,SVG to CX,seq. svg to posterior descending and posterolateral RCA  . Cardiac catheterization  05/08/2009    small vessel disease  . Cardiac defibrillator placement  05/09/2009    [employer]  . Back surgery    . I&d extremity Left 11/03/2013    Procedure: Left Leg Debride Ulcer, Apply Wound VAC and Theraskin;  Surgeon: Newt Minion, MD;  Location: [place];  Service: Orthopedics;  Laterality: Left;  . Tee without cardioversion N/A 01/11/2015    Procedure: TRANSESOPHAGEAL  ECHOCARDIOGRAM (TEE);  Surgeon: Herminio Commons, MD;  [facility];  Service: Cardiology;  Laterality: N/A;   Family History  Problem Relation Age of Onset  . Heart attack Mother   . Stroke Father    Social History  Substance Use Topics  . Smoking status: Former Smoker -- 1.50 packs/day for 35 years    Types: Cigarettes    Quit date: 11/03/1995  . Smokeless tobacco: Never Used  . Alcohol Use: No    Review of Systems  Constitutional: Negative.  Negative for fever.  Respiratory: Negative.  Negative for chest tightness and shortness of breath.   Cardiovascular: Negative.  Negative for chest pain.  Gastrointestinal: Negative.  Negative for nausea, vomiting and abdominal pain.  Genitourinary: Negative.  Negative for dysuria.  Musculoskeletal: Negative for back pain.  Skin: Positive for wound. Negative for rash.  Neurological: Positive for dizziness and headaches. Negative for weakness and numbness.  All other systems reviewed and are negative.     Allergies  Review of patient's allergies indicates no known allergies.  Home Medications   Prior to Admission medications   Medication Sig Start Date End Date Taking? Authorizing Provider  ALPRAZolam Duanne Moron) 1 MG tablet Take 1 mg by mouth daily as needed for anxiety or sleep.     Historical Provider, MD  amiodarone (PACERONE) 200 MG tablet Take 200 mg by mouth daily.    Historical Provider, MD  aspirin EC 81 MG tablet Take 81 mg by  mouth at bedtime.    Historical Provider, MD  atorvastatin (LIPITOR) 40 MG tablet Take 40 mg by mouth daily.    Historical Provider, MD  cefTRIAXone 2 g in dextrose 5 % 50 mL Inject 2 g into the vein daily. 01/12/15   Orvan Falconer, MD  digoxin (LANOXIN) 0.125 MG tablet Take 0.125 mg by mouth daily.     Historical Provider, MD  levothyroxine (SYNTHROID, LEVOTHROID) 112 MCG tablet Take 112 mcg by mouth daily before breakfast.    Historical Provider, MD  magnesium oxide (MAG-OX) 400 MG tablet Take  400 mg by mouth daily.    Historical Provider, MD  metoprolol (LOPRESSOR) 50 MG tablet TAKE 1 TABLET BY MOUTH TWICE DAILY. 10/23/14   Lorretta Harp, MD  niacin (NIASPAN) 1000 MG CR tablet TAKE (1) TABLET BY MOUTH AT BEDTIME. 08/23/14   Lorretta Harp, MD  oxyCODONE-acetaminophen (PERCOCET) 10-325 MG per tablet Take 1 tablet by mouth every 4 (four) hours as needed for pain.    Historical Provider, MD  pioglitazone (ACTOS) 45 MG tablet Take 45 mg by mouth daily.    Historical Provider, MD  PRADAXA 75 MG CAPS capsule TAKE 1 CAPSULE BY MOUTH EVERY 12 HOURS. 01/25/15   Mihai Croitoru, MD  ramipril (ALTACE) 2.5 MG capsule Take 2.5 mg by mouth at bedtime.     Historical Provider, MD  ZETIA 10 MG tablet TAKE 1 TABLET ONCE DAILY FOR CHOLESTEROL. 08/23/14   Lorretta Harp, MD  zolpidem (AMBIEN) 10 MG tablet Take 10 mg by mouth at bedtime as needed for sleep.    Historical Provider, MD   BP 134/52 mmHg  Pulse 54  Temp(Src) 98 F (36.7 C) (Oral)  Resp 17  SpO2 98% Physical Exam  Constitutional: He is oriented to person, place, and time. No distress.  Elderly, ABC's intact, no acute distress  HENT:  Head: Normocephalic.  Mouth/Throat: Oropharynx is clear and moist.  2 cm hematoma noted over the left forehead, minor abrasion noted to the lateral aspect of the left eye, midface stable  Eyes:  Pupils 5 mm and reactive bilaterally  Neck: Normal range of motion. Neck supple.  Tenderness to palpation over C6 without midline step-off, or deformity  Cardiovascular: Normal rate and regular rhythm.   Murmur heard. Pulmonary/Chest: Effort normal and breath sounds normal. No respiratory distress. He has no wheezes.  ICD palpated left upper chest  Abdominal: Soft. Bowel sounds are normal. There is no tenderness. There is no rebound.  Musculoskeletal: He exhibits no edema.  Normal range of motion left elbow, normal range of motion bilateral hips and knees  Lymphadenopathy:    He has no cervical adenopathy.   Neurological: He is alert and oriented to person, place, and time.  Cranial nerves II through XII intact, 5 out of 5 strength bilateral upper extremities, no dysmetria to finger-nose-finger, no drift  Skin: Skin is warm and dry.  4 x 2 cm skin tear noted over the left elbow, no active bleeding  Psychiatric: He has a normal mood and affect.  Nursing note and vitals reviewed.   ED Course  Procedures (including critical care time) Labs Review Labs Reviewed  CBC WITH DIFFERENTIAL/PLATELET - Abnormal; Notable for the following:    RBC 3.23 (*)    Hemoglobin 10.4 (*)    HCT 32.7 (*)    MCV 101.2 (*)    All other components within normal limits  URINALYSIS, ROUTINE W REFLEX MICROSCOPIC (NOT AT [facility])  BASIC METABOLIC PANEL  Imaging Review No results found. I have personally reviewed and evaluated these images and lab results as part of my medical decision-making.   EKG Interpretation   Date/Time:  Sunday March 03 2015 06:21:54 EST Ventricular Rate:  51 PR Interval:  57 QRS Duration: 155 QT Interval:  486 QTC Calculation: 448 R Axis:   -94 Text Interpretation:  Atrial-ventricular dual-paced rhythm No further  analysis attempted due to paced rhythm Confirmed by HORTON  MD, [place]  [id number]) on 03/03/2015 7:05:35 AM      MDM   Final diagnoses:  None    Patient presents following a fall.  Reports recurrent vertiginous symptoms x 6 mos.  Has not been worked up.  THinks it is related to his inner ear.  CUrrently assymptomatic.  Already on Pradaxa and no evidence of cerebellar dysfunction.  Injuries as noted above.  Imaging pending.  Discussed with Dr. Wyvonnia Dusky to f/u imaging.      Merryl Hacker, MD 03/08/15 [phone]

## 2015-03-03 NOTE — ED Notes (Signed)
AICD interrogated.  Dr. Wyvonnia Dusky at bedside talking with patient and wife.

## 2015-03-03 NOTE — ED Provider Notes (Signed)
Care assumed from Dr. Dina Rich.  Patient with episode of dizziness and head and elbow injury, on pradaxa.  No CP or SOB. Intermittent dizziness x 6 months.  Recently treated for bacteremia.  CT head and C-spine are negative. Elbow x-rays negative. Patient is able to ambulate and complains of some dizziness. He has no ataxia.  Unable to have MRI due to pacemaker. He is on Pradaxa which makes ischemic stroke less likely. He's had intermittent dizziness for several months.  AICD interrogation shows no episodes of VF or VT. No shocks.  Patient is ambulatory and tolerating by mouth. He denies any further dizziness. He has no ataxia on exam. Discussed possible risk of delayed head injury on Pradaxa. Needs to return to ED with worsening headache, vomiting, confusion, behavior change or any other concerns. Follow up with Dr. Gerarda Fraction this week.  Per device interrogation patient's battery life is approaching elective replacement age. Recommend follow-up with his cardiologist this week.  Ezequiel Essex, MD 03/03/15 [phone]

## 2015-03-03 NOTE — ED Notes (Signed)
Pt arrived to er for further evaluation of injuries from a fall, pt states that he has been having problems with dizziness for the past 6 months or so, worse recently, was going to restroom this am, became dizzy, fell hitting head and arm against the bathroom door, pt has bruising, swelling noted to left forehead area, bandage in place to left arm,

## 2015-03-06 ENCOUNTER — Emergency Department (HOSPITAL_COMMUNITY): Payer: Medicare Other

## 2015-03-06 ENCOUNTER — Emergency Department (HOSPITAL_COMMUNITY)
Admission: EM | Admit: 2015-03-06 | Discharge: 2015-03-06 | Disposition: A | Discharge status: Home / Self Care | Payer: Medicare Other | Attending: Emergency Medicine | Admitting: Emergency Medicine

## 2015-03-06 ENCOUNTER — Encounter (HOSPITAL_COMMUNITY): Payer: Self-pay

## 2015-03-06 DIAGNOSIS — I10 Essential (primary) hypertension: Secondary | ICD-10-CM | POA: Diagnosis not present

## 2015-03-06 DIAGNOSIS — R251 Tremor, unspecified: Secondary | ICD-10-CM | POA: Diagnosis present

## 2015-03-06 DIAGNOSIS — Y9289 Other specified places as the place of occurrence of the external cause: Secondary | ICD-10-CM | POA: Diagnosis not present

## 2015-03-06 DIAGNOSIS — Y9389 Activity, other specified: Secondary | ICD-10-CM | POA: Insufficient documentation

## 2015-03-06 DIAGNOSIS — W1839XA Other fall on same level, initial encounter: Secondary | ICD-10-CM | POA: Diagnosis not present

## 2015-03-06 DIAGNOSIS — Z7982 Long term (current) use of aspirin: Secondary | ICD-10-CM | POA: Insufficient documentation

## 2015-03-06 DIAGNOSIS — R42 Dizziness and giddiness: Secondary | ICD-10-CM | POA: Diagnosis not present

## 2015-03-06 DIAGNOSIS — Y998 Other external cause status: Secondary | ICD-10-CM | POA: Insufficient documentation

## 2015-03-06 DIAGNOSIS — E785 Hyperlipidemia, unspecified: Secondary | ICD-10-CM | POA: Diagnosis not present

## 2015-03-06 DIAGNOSIS — Z87891 Personal history of nicotine dependence: Secondary | ICD-10-CM | POA: Diagnosis not present

## 2015-03-06 DIAGNOSIS — Z95810 Presence of automatic (implantable) cardiac defibrillator: Secondary | ICD-10-CM | POA: Insufficient documentation

## 2015-03-06 DIAGNOSIS — Z951 Presence of aortocoronary bypass graft: Secondary | ICD-10-CM | POA: Insufficient documentation

## 2015-03-06 DIAGNOSIS — R002 Palpitations: Secondary | ICD-10-CM | POA: Insufficient documentation

## 2015-03-06 DIAGNOSIS — Z79899 Other long term (current) drug therapy: Secondary | ICD-10-CM | POA: Insufficient documentation

## 2015-03-06 DIAGNOSIS — Z9889 Other specified postprocedural states: Secondary | ICD-10-CM | POA: Insufficient documentation

## 2015-03-06 DIAGNOSIS — E119 Type 2 diabetes mellitus without complications: Secondary | ICD-10-CM | POA: Diagnosis not present

## 2015-03-06 DIAGNOSIS — I2510 Atherosclerotic heart disease of native coronary artery without angina pectoris: Secondary | ICD-10-CM | POA: Diagnosis not present

## 2015-03-06 DIAGNOSIS — Z86718 Personal history of other venous thrombosis and embolism: Secondary | ICD-10-CM | POA: Diagnosis not present

## 2015-03-06 DIAGNOSIS — R630 Anorexia: Secondary | ICD-10-CM | POA: Insufficient documentation

## 2015-03-06 DIAGNOSIS — S0083XA Contusion of other part of head, initial encounter: Secondary | ICD-10-CM | POA: Insufficient documentation

## 2015-03-06 DIAGNOSIS — R001 Bradycardia, unspecified: Secondary | ICD-10-CM | POA: Insufficient documentation

## 2015-03-06 LAB — BASIC METABOLIC PANEL
Anion gap: 6 (ref 5–15)
BUN: 15 mg/dL (ref 6–20)
CHLORIDE: 104 mmol/L (ref 101–111)
CO2: 29 mmol/L (ref 22–32)
CREATININE: 1.46 mg/dL — AB (ref 0.61–1.24)
Calcium: 8.9 mg/dL (ref 8.9–10.3)
GFR calc non Af Amer: 47 mL/min — ABNORMAL LOW (ref >60)
GFR, EST AFRICAN AMERICAN: 54 mL/min — AB (ref >60)
GLUCOSE: 160 mg/dL — AB (ref 65–99)
Potassium: 4 mmol/L (ref 3.5–5.1)
Sodium: 139 mmol/L (ref 135–145)

## 2015-03-06 LAB — URINALYSIS, ROUTINE W REFLEX MICROSCOPIC
Bilirubin Urine: NEGATIVE
GLUCOSE, UA: NEGATIVE mg/dL
HGB URINE DIPSTICK: NEGATIVE
Ketones, ur: NEGATIVE mg/dL
Leukocytes, UA: NEGATIVE
Nitrite: NEGATIVE
pH: 5 (ref 5.0–8.0)

## 2015-03-06 LAB — URINE MICROSCOPIC-ADD ON

## 2015-03-06 LAB — CBC
HCT: 36.9 % — ABNORMAL LOW (ref 39.0–52.0)
Hemoglobin: 11.6 g/dL — ABNORMAL LOW (ref 13.0–17.0)
MCH: 32 pg (ref 26.0–34.0)
MCHC: 31.4 g/dL (ref 30.0–36.0)
MCV: 101.9 fL — AB (ref 78.0–100.0)
PLATELETS: 215 10*3/uL (ref 150–400)
RBC: 3.62 MIL/uL — AB (ref 4.22–5.81)
RDW: 15.3 % (ref 11.5–15.5)
WBC: 7.4 10*3/uL (ref 4.0–10.5)

## 2015-03-06 LAB — PROTIME-INR
INR: 1.35 (ref 0.00–1.49)
Prothrombin Time: 16.8 seconds — ABNORMAL HIGH (ref 11.6–15.2)

## 2015-03-06 LAB — TROPONIN I: Troponin I: <0.03 ng/mL (ref <0.031)

## 2015-03-06 NOTE — ED Notes (Signed)
Pt reports SUnday he started having tremors and fell in the floor.  Today started feeling tremors again and wife says feels like pt's heart was racing.  Denies any chest pain or sob.

## 2015-03-06 NOTE — Discharge Instructions (Signed)
Follow-up with Dr. Gerarda Fraction. Watch for fevers or chills or more lightheadedness or dizziness.

## 2015-03-06 NOTE — ED Provider Notes (Signed)
CSN: [id number]     Arrival date & time 03/06/15  1321 History   First MD Initiated Contact with Patient 03/06/15 1401     Chief Complaint  Patient presents with  . Tremors  . Palpitations     (Consider location/radiation/quality/duration/timing/severity/associated sxs/prior Treatment) Patient is a 69 y.o. male presenting with palpitations. The history is provided by the patient.  Palpitations Associated symptoms: no back pain, no chest pain, no nausea, no numbness, no shortness of breath, no vomiting and no weakness    patient states that he began to feel his heart going faster. States he then began to feel his arms shake. States it was like he was in DTs but states he does not drink. He has had episodes of dizziness over the last 6 months. States his been some change in his medicines. He does have an AICD and is paced much of the time. Had a rescue anterior 3 days ago and was reportedly nearing the elective replacement time. No chest pain. He has had a decreased appetite. No abdominal pain. No headache. No confusion. He is somewhat hard of hearing. Reportedly there was shaking in both his hands and he was still conscious with the event.  Past Medical History  Diagnosis Date  . Ischemic cardiomyopathy   . CAD (coronary artery disease)   . S/P CABG x 5   . PAF (paroxysmal atrial fibrillation) ([place])   . Hypertension   . Hyperlipidemia   . RBBB   . ICD (implantable cardioverter-defibrillator), single, in situ   . DM (diabetes mellitus) ([place])     type 2   . DVT (deep venous thrombosis) [facility])    Past Surgical History  Procedure Laterality Date  . Coronary artery bypass graft  12/31/1995    LIMA to LAD,SVG to intermediate,SVG to CX,seq. svg to posterior descending and posterolateral RCA  . Cardiac catheterization  05/08/2009    small vessel disease  . Cardiac defibrillator placement  05/09/2009    [employer]  . Back surgery    . I&d extremity Left 11/03/2013    Procedure: Left Leg  Debride Ulcer, Apply Wound VAC and Theraskin;  Surgeon: Newt Minion, MD;  Location: [place];  Service: Orthopedics;  Laterality: Left;  . Tee without cardioversion N/A 01/11/2015    Procedure: TRANSESOPHAGEAL ECHOCARDIOGRAM (TEE);  Surgeon: Herminio Commons, MD;  [facility];  Service: Cardiology;  Laterality: N/A;   Family History  Problem Relation Age of Onset  . Heart attack Mother   . Stroke Father    Social History  Substance Use Topics  . Smoking status: Former Smoker -- 1.50 packs/day for 35 years    Types: Cigarettes    Quit date: 11/03/1995  . Smokeless tobacco: Never Used  . Alcohol Use: No    Review of Systems  Constitutional: Negative for activity change and appetite change.  Eyes: Negative for pain.  Respiratory: Negative for chest tightness and shortness of breath.   Cardiovascular: Positive for palpitations. Negative for chest pain and leg swelling.  Gastrointestinal: Negative for nausea, vomiting, abdominal pain and diarrhea.  Genitourinary: Negative for flank pain.  Musculoskeletal: Negative for back pain and neck stiffness.  Skin: Negative for rash.  Neurological: Positive for tremors and light-headedness. Negative for weakness, numbness and headaches.  Psychiatric/Behavioral: Negative for behavioral problems.      Allergies  Review of patient's allergies indicates no known allergies.  Home Medications   Prior to Admission medications   Medication Sig Start Date End  Date Taking? Authorizing Provider  ALPRAZolam Duanne Moron) 1 MG tablet Take 1 mg by mouth daily as needed for anxiety or sleep.    Yes Historical Provider, MD  amiodarone (PACERONE) 200 MG tablet Take 200 mg by mouth daily.   Yes Historical Provider, MD  aspirin EC 81 MG tablet Take 81 mg by mouth at bedtime.   Yes Historical Provider, MD  atorvastatin (LIPITOR) 40 MG tablet Take 40 mg by mouth daily.   Yes Historical Provider, MD  digoxin (LANOXIN) 0.125 MG tablet Take 0.125 mg by  mouth daily.    Yes Historical Provider, MD  levothyroxine (SYNTHROID, LEVOTHROID) 112 MCG tablet Take 112 mcg by mouth daily before breakfast.   Yes Historical Provider, MD  magnesium oxide (MAG-OX) 400 MG tablet Take 400 mg by mouth daily.   Yes Historical Provider, MD  metoprolol (LOPRESSOR) 50 MG tablet TAKE 1 TABLET BY MOUTH TWICE DAILY. 10/23/14  Yes Lorretta Harp, MD  niacin (NIASPAN) 1000 MG CR tablet TAKE (1) TABLET BY MOUTH AT BEDTIME. 08/23/14  Yes Lorretta Harp, MD  oxyCODONE-acetaminophen (PERCOCET) 10-325 MG per tablet Take 1 tablet by mouth every 4 (four) hours as needed for pain.   Yes Historical Provider, MD  pioglitazone (ACTOS) 45 MG tablet Take 45 mg by mouth daily.   Yes Historical Provider, MD  PRADAXA 75 MG CAPS capsule TAKE 1 CAPSULE BY MOUTH EVERY 12 HOURS. 01/25/15  Yes Mihai Croitoru, MD  ramipril (ALTACE) 2.5 MG capsule Take 2.5 mg by mouth at bedtime.    Yes Historical Provider, MD  ZETIA 10 MG tablet TAKE 1 TABLET ONCE DAILY FOR CHOLESTEROL. 08/23/14  Yes Lorretta Harp, MD  zolpidem (AMBIEN) 10 MG tablet Take 10 mg by mouth at bedtime as needed for sleep.   Yes Historical Provider, MD  cefTRIAXone 2 g in dextrose 5 % 50 mL Inject 2 g into the vein daily. Patient not taking: Reported on 03/06/2015 01/12/15   Orvan Falconer, MD   BP 129/39 mmHg  Pulse 50  Temp(Src) 98 F (36.7 C) (Oral)  Resp 20  Ht 6\' 2"  (1.88 m)  Wt 180 lb (81.647 kg)  BMI 23.10 kg/m2  SpO2 99% Physical Exam  Constitutional: He appears well-developed.  HENT:  Hematoma to left forehead with ecchymosis.  Neck: Neck supple.  Cardiovascular:  Mild bradycardia  Pulmonary/Chest: Effort normal.  Abdominal: Soft.  Musculoskeletal: Normal range of motion.  Neurological: He is alert.  Skin: Skin is warm.    ED Course  Procedures (including critical care time) Labs Review Labs Reviewed  BASIC METABOLIC PANEL - Abnormal; Notable for the following:    Glucose, Bld 160 (*)    Creatinine, Ser  1.46 (*)    GFR calc non Af Amer 47 (*)    GFR calc Af Amer 54 (*)    All other components within normal limits  CBC - Abnormal; Notable for the following:    RBC 3.62 (*)    Hemoglobin 11.6 (*)    HCT 36.9 (*)    MCV 101.9 (*)    All other components within normal limits  URINALYSIS, ROUTINE W REFLEX MICROSCOPIC (NOT AT [facility]) - Abnormal; Notable for the following:    Specific Gravity, Urine >1.030 (*)    Protein, ur TRACE (*)    All other components within normal limits  PROTIME-INR - Abnormal; Notable for the following:    Prothrombin Time 16.8 (*)    All other components within normal limits  URINE MICROSCOPIC-ADD  ON - Abnormal; Notable for the following:    Squamous Epithelial / LPF 0-5 (*)    Bacteria, UA FEW (*)    Casts HYALINE CASTS (*)    All other components within normal limits  TROPONIN I  CBG MONITORING, ED    Imaging Review Dg Chest 2 View  03/06/2015  CLINICAL DATA:  [place] with tremors on Sunday. Tremors again today. Heart racing. EXAM: CHEST  2 VIEW COMPARISON:  01/10/2015 FINDINGS: Left AICD remains in place, unchanged. Prior CABG. Heart is normal size. Lungs are clear. No effusions or acute bony abnormality. IMPRESSION: No active cardiopulmonary disease. Electronically Signed   By: Rolm Baptise M.D.   On: 03/06/2015 15:23   I have personally reviewed and evaluated these images and lab results as part of my medical decision-making.   EKG Interpretation None     ED ECG REPORT   Date: 03/06/2015  Rate: 60  Rhythm: paced  QRS Axis: indeterminate  Intervals: paced  ST/T Wave abnormalities: paced  Conduction Disutrbances:paced  Narrative Interpretation:   Old EKG Reviewed: unchanged    MDM   Final diagnoses:  Lightheadedness    Patient with lightheadedness and dizziness. Has had episodes of same. Lab work overall reassuring. Maybe a little dehydrated. Feels better after some IV fluid. AICD interrogated 3 days ago. No arrhythmia. Will discharge home  to follow-up with his PCP and cardiologist.   Davonna Belling, MD 03/06/15 [phone]

## 2015-03-12 ENCOUNTER — Ambulatory Visit (INDEPENDENT_AMBULATORY_CARE_PROVIDER_SITE_OTHER): Payer: Medicare Other | Admitting: *Deleted

## 2015-03-12 DIAGNOSIS — I255 Ischemic cardiomyopathy: Secondary | ICD-10-CM

## 2015-03-12 DIAGNOSIS — I5022 Chronic systolic (congestive) heart failure: Secondary | ICD-10-CM

## 2015-03-12 NOTE — Progress Notes (Signed)
Remote ICD transmission.   

## 2015-03-13 LAB — CUP PACEART REMOTE DEVICE CHECK
Brady Statistic AP VS Percent: 0.01 %
Brady Statistic AS VS Percent: 0 %
HIGH POWER IMPEDANCE MEASURED VALUE: 42 Ohm
HIGH POWER IMPEDANCE MEASURED VALUE: 55 Ohm
Implantable Lead Implant Date: 20110224
Implantable Lead Implant Date: 20110224
Implantable Lead Location: 753859
Implantable Lead Model: 5076
Implantable Lead Model: 6947
Lead Channel Impedance Value: 494 Ohm
Lead Channel Impedance Value: 551 Ohm
Lead Channel Pacing Threshold Amplitude: 0.875 V
Lead Channel Pacing Threshold Pulse Width: 0.4 ms
Lead Channel Sensing Intrinsic Amplitude: 0.625 mV
Lead Channel Sensing Intrinsic Amplitude: 10.125 mV
Lead Channel Setting Pacing Amplitude: 2 V
Lead Channel Setting Pacing Amplitude: 2.5 V
Lead Channel Setting Pacing Amplitude: 2.5 V
Lead Channel Setting Pacing Pulse Width: 0.4 ms
Lead Channel Setting Pacing Pulse Width: 0.4 ms
MDC IDC LEAD IMPLANT DT: 20110224
MDC IDC LEAD LOCATION: 753858
MDC IDC LEAD LOCATION: 753860
MDC IDC LEAD MODEL: 4196
MDC IDC MSMT BATTERY VOLTAGE: 2.63 V
MDC IDC MSMT LEADCHNL LV IMPEDANCE VALUE: 950 Ohm
MDC IDC MSMT LEADCHNL RA IMPEDANCE VALUE: 475 Ohm
MDC IDC MSMT LEADCHNL RA SENSING INTR AMPL: 0.625 mV
MDC IDC MSMT LEADCHNL RV IMPEDANCE VALUE: 494 Ohm
MDC IDC MSMT LEADCHNL RV SENSING INTR AMPL: 10.125 mV
MDC IDC SESS DTM: 20161227051607
MDC IDC SET LEADCHNL RV SENSING SENSITIVITY: 0.3 mV
MDC IDC STAT BRADY AP VP PERCENT: 87.66 %
MDC IDC STAT BRADY AS VP PERCENT: 12.32 %
MDC IDC STAT BRADY RA PERCENT PACED: 87.68 %
MDC IDC STAT BRADY RV PERCENT PACED: 99.98 %

## 2015-03-14 ENCOUNTER — Encounter: Payer: Self-pay | Admitting: Cardiology

## 2015-04-15 ENCOUNTER — Ambulatory Visit (INDEPENDENT_AMBULATORY_CARE_PROVIDER_SITE_OTHER): Payer: Medicare Other | Admitting: *Deleted

## 2015-04-15 DIAGNOSIS — Z950 Presence of cardiac pacemaker: Secondary | ICD-10-CM

## 2015-04-15 NOTE — Progress Notes (Signed)
Remote ICD transmission.   

## 2015-04-23 LAB — CUP PACEART REMOTE DEVICE CHECK
Brady Statistic AP VS Percent: 0.02 %
Brady Statistic AS VS Percent: 0 %
Brady Statistic RV Percent Paced: 99.98 %
HIGH POWER IMPEDANCE MEASURED VALUE: 47 Ohm
HighPow Impedance: 40 Ohm
Implantable Lead Implant Date: 20110224
Implantable Lead Implant Date: 20110224
Implantable Lead Location: 753858
Implantable Lead Location: 753860
Implantable Lead Model: 6947
Lead Channel Impedance Value: 418 Ohm
Lead Channel Impedance Value: 437 Ohm
Lead Channel Impedance Value: 475 Ohm
Lead Channel Pacing Threshold Pulse Width: 0.4 ms
Lead Channel Sensing Intrinsic Amplitude: 0.625 mV
Lead Channel Sensing Intrinsic Amplitude: 0.625 mV
Lead Channel Setting Sensing Sensitivity: 0.3 mV
MDC IDC LEAD IMPLANT DT: 20110224
MDC IDC LEAD LOCATION: 753859
MDC IDC LEAD MODEL: 4196
MDC IDC MSMT BATTERY VOLTAGE: 2.62 V
MDC IDC MSMT LEADCHNL LV IMPEDANCE VALUE: 836 Ohm
MDC IDC MSMT LEADCHNL LV PACING THRESHOLD AMPLITUDE: 0.875 V
MDC IDC MSMT LEADCHNL RV IMPEDANCE VALUE: 437 Ohm
MDC IDC MSMT LEADCHNL RV SENSING INTR AMPL: 9.125 mV
MDC IDC MSMT LEADCHNL RV SENSING INTR AMPL: 9.125 mV
MDC IDC SESS DTM: 20170130094226
MDC IDC SET LEADCHNL LV PACING AMPLITUDE: 2 V
MDC IDC SET LEADCHNL LV PACING PULSEWIDTH: 0.4 ms
MDC IDC SET LEADCHNL RA PACING AMPLITUDE: 2.5 V
MDC IDC SET LEADCHNL RV PACING AMPLITUDE: 2.5 V
MDC IDC SET LEADCHNL RV PACING PULSEWIDTH: 0.4 ms
MDC IDC STAT BRADY AP VP PERCENT: 94.72 %
MDC IDC STAT BRADY AS VP PERCENT: 5.26 %
MDC IDC STAT BRADY RA PERCENT PACED: 94.74 %

## 2015-04-24 ENCOUNTER — Encounter: Payer: Self-pay | Admitting: Cardiology

## 2015-04-25 DIAGNOSIS — R809 Proteinuria, unspecified: Secondary | ICD-10-CM | POA: Diagnosis not present

## 2015-04-25 DIAGNOSIS — E559 Vitamin D deficiency, unspecified: Secondary | ICD-10-CM | POA: Diagnosis not present

## 2015-04-25 DIAGNOSIS — D509 Iron deficiency anemia, unspecified: Secondary | ICD-10-CM | POA: Diagnosis not present

## 2015-04-25 DIAGNOSIS — N183 Chronic kidney disease, stage 3 (moderate): Secondary | ICD-10-CM | POA: Diagnosis not present

## 2015-04-25 DIAGNOSIS — Z79899 Other long term (current) drug therapy: Secondary | ICD-10-CM | POA: Diagnosis not present

## 2015-04-25 DIAGNOSIS — I10 Essential (primary) hypertension: Secondary | ICD-10-CM | POA: Diagnosis not present

## 2015-04-30 DIAGNOSIS — E559 Vitamin D deficiency, unspecified: Secondary | ICD-10-CM | POA: Diagnosis not present

## 2015-04-30 DIAGNOSIS — N183 Chronic kidney disease, stage 3 (moderate): Secondary | ICD-10-CM | POA: Diagnosis not present

## 2015-04-30 DIAGNOSIS — D509 Iron deficiency anemia, unspecified: Secondary | ICD-10-CM | POA: Diagnosis not present

## 2015-04-30 DIAGNOSIS — E1129 Type 2 diabetes mellitus with other diabetic kidney complication: Secondary | ICD-10-CM | POA: Diagnosis not present

## 2015-04-30 DIAGNOSIS — R809 Proteinuria, unspecified: Secondary | ICD-10-CM | POA: Diagnosis not present

## 2015-05-16 ENCOUNTER — Ambulatory Visit (INDEPENDENT_AMBULATORY_CARE_PROVIDER_SITE_OTHER): Payer: Medicare Other | Admitting: *Deleted

## 2015-05-16 DIAGNOSIS — Z950 Presence of cardiac pacemaker: Secondary | ICD-10-CM

## 2015-05-16 NOTE — Progress Notes (Signed)
Remote ICD transmission.   

## 2015-05-17 ENCOUNTER — Other Ambulatory Visit: Payer: Self-pay | Admitting: *Deleted

## 2015-05-17 MED ORDER — DABIGATRAN ETEXILATE MESYLATE 75 MG PO CAPS: ORAL_CAPSULE | ORAL | Status: DC

## 2015-05-20 NOTE — Telephone Encounter (Signed)
Refill for pradaxa sent to pharmacy

## 2015-05-24 LAB — CUP PACEART REMOTE DEVICE CHECK
Brady Statistic AP VS Percent: 0.02 %
Brady Statistic AS VP Percent: 6.03 %
Brady Statistic AS VS Percent: 0 %
HIGH POWER IMPEDANCE MEASURED VALUE: 40 Ohm
HIGH POWER IMPEDANCE MEASURED VALUE: 47 Ohm
Implantable Lead Implant Date: 20110224
Implantable Lead Location: 753858
Implantable Lead Location: 753860
Implantable Lead Model: 6947
Lead Channel Impedance Value: 437 Ohm
Lead Channel Impedance Value: 475 Ohm
Lead Channel Pacing Threshold Amplitude: 0.875 V
Lead Channel Pacing Threshold Pulse Width: 0.4 ms
Lead Channel Sensing Intrinsic Amplitude: 0.5 mV
Lead Channel Sensing Intrinsic Amplitude: 0.5 mV
Lead Channel Sensing Intrinsic Amplitude: 8.5 mV
Lead Channel Setting Pacing Pulse Width: 0.4 ms
MDC IDC LEAD IMPLANT DT: 20110224
MDC IDC LEAD IMPLANT DT: 20110224
MDC IDC LEAD LOCATION: 753859
MDC IDC LEAD MODEL: 4196
MDC IDC MSMT BATTERY VOLTAGE: 2.63 V
MDC IDC MSMT LEADCHNL LV IMPEDANCE VALUE: 798 Ohm
MDC IDC MSMT LEADCHNL RA IMPEDANCE VALUE: 475 Ohm
MDC IDC MSMT LEADCHNL RV IMPEDANCE VALUE: 475 Ohm
MDC IDC MSMT LEADCHNL RV SENSING INTR AMPL: 8.5 mV
MDC IDC SESS DTM: 20170302051807
MDC IDC SET LEADCHNL LV PACING AMPLITUDE: 2 V
MDC IDC SET LEADCHNL LV PACING PULSEWIDTH: 0.4 ms
MDC IDC SET LEADCHNL RA PACING AMPLITUDE: 2.5 V
MDC IDC SET LEADCHNL RV PACING AMPLITUDE: 2.5 V
MDC IDC SET LEADCHNL RV SENSING SENSITIVITY: 0.3 mV
MDC IDC STAT BRADY AP VP PERCENT: 93.95 %
MDC IDC STAT BRADY RA PERCENT PACED: 93.97 %
MDC IDC STAT BRADY RV PERCENT PACED: 99.98 %

## 2015-05-24 NOTE — Progress Notes (Signed)
Battery check only Voltage 2.63 but not yet tripped [facility] 06/12/15

## 2015-05-29 ENCOUNTER — Encounter: Payer: Self-pay | Admitting: Cardiology

## 2015-06-04 ENCOUNTER — Encounter: Payer: Medicare Other | Admitting: Cardiovascular Disease

## 2015-06-12 ENCOUNTER — Encounter: Payer: Medicare Other | Admitting: Cardiovascular Disease

## 2015-06-19 ENCOUNTER — Encounter: Payer: Self-pay | Admitting: Cardiovascular Disease

## 2015-06-19 ENCOUNTER — Ambulatory Visit (INDEPENDENT_AMBULATORY_CARE_PROVIDER_SITE_OTHER): Payer: Medicare Other | Admitting: Cardiovascular Disease

## 2015-06-19 VITALS — BP 132/76 | HR 61 | Ht 74.0 in | Wt 162.6 lb

## 2015-06-19 DIAGNOSIS — I2510 Atherosclerotic heart disease of native coronary artery without angina pectoris: Secondary | ICD-10-CM

## 2015-06-19 DIAGNOSIS — I5022 Chronic systolic (congestive) heart failure: Secondary | ICD-10-CM

## 2015-06-19 DIAGNOSIS — I472 Ventricular tachycardia, unspecified: Secondary | ICD-10-CM

## 2015-06-19 DIAGNOSIS — Z95810 Presence of automatic (implantable) cardiac defibrillator: Secondary | ICD-10-CM

## 2015-06-19 DIAGNOSIS — Z79899 Other long term (current) drug therapy: Secondary | ICD-10-CM

## 2015-06-19 DIAGNOSIS — I480 Paroxysmal atrial fibrillation: Secondary | ICD-10-CM

## 2015-06-19 NOTE — Progress Notes (Signed)
Patient ID: Alan Orr, male   DOB: [birth year], 70 y.o.   MRN: [id number]    Cardiology Office Note    Date:  06/19/2015   ID:  Alan Orr, Alan Orr 03-02-1946, MRN [id number]  PCP:  Glo Herring., MD  Cardiologist:  Quay Burow, M.D. Sanda Klein, MD   Chief Complaint  Patient presents with  . Follow-up    patiet reports no complaints    History of Present Illness:  Alan Orr is a 70 y.o. male with severe ischemic cardiomyopathy and previous remote bypass surgery, excisional atrial fibrillation, previous DVT, hypertension, diabetes mellitus followed by Dr. Gwenlyn Found. He is here for follow-up on his CRT-D device.  He is recovering from an upper respiratory tract infection, but otherwise has no recent complaints. He specifically denies any change in exertional dyspnea and has not had angina pectoris, palpitations, syncope, leg edema, claudication or focal neurological events  Interrogation of his defibrillator shows normal device function. The presenting rhythm was atrial paced-biventricular paced and this has been his rhythm 87% of the time. He has virtually 100% biventricular pacing efficiency. Battery longevity is likely less than 3 months. Voltage is 2.63 V, close to RRT. The device has not recorded any ventricular or atrial arrhythmias. He has never had defibrillator discharges. His the parameters are excellent and he has an excellent left ventricular lead threshold.  He does have a remote history of ventricular tachycardia and paroxysmal atrial fibrillation and takes Pradaxa, digoxin and amiodarone. His recent LV EF estimation was 42% on a January 2014 nuclear perfusion study, but 50-55 percent by transesophageal echo performed in October 2016 for bacteremia.    Past Medical History  Diagnosis Date  . Ischemic cardiomyopathy   . CAD (coronary artery disease)   . S/P CABG x 5   . PAF (paroxysmal atrial fibrillation) ([place])   . Hypertension   . Hyperlipidemia   .  RBBB   . ICD (implantable cardioverter-defibrillator), single, in situ   . DM (diabetes mellitus) ([place])     type 2   . DVT (deep venous thrombosis) [facility])     Past Surgical History  Procedure Laterality Date  . Coronary artery bypass graft  12/31/1995    LIMA to LAD,SVG to intermediate,SVG to CX,seq. svg to posterior descending and posterolateral RCA  . Cardiac catheterization  05/08/2009    small vessel disease  . Cardiac defibrillator placement  05/09/2009    [employer]  . Back surgery    . I&d extremity Left 11/03/2013    Procedure: Left Leg Debride Ulcer, Apply Wound VAC and [place];  Surgeon: Newt Minion, MD;  Location: [facility];  Service: Orthopedics;  Laterality: Left;  . Tee without cardioversion N/A 01/11/2015    Procedure: TRANSESOPHAGEAL ECHOCARDIOGRAM (TEE);  Surgeon: Herminio Commons, MD;  [facility];  Service: Cardiology;  Laterality: N/A;    Current Medications: Outpatient Prescriptions Prior to Visit  Medication Sig Dispense Refill  . ALPRAZolam (XANAX) 1 MG tablet Take 1 mg by mouth daily as needed for anxiety or sleep.     Marland Kitchen amiodarone (PACERONE) 200 MG tablet Take 200 mg by mouth daily.    Marland Kitchen aspirin EC 81 MG tablet Take 81 mg by mouth at bedtime.    Marland Kitchen atorvastatin (LIPITOR) 40 MG tablet Take 40 mg by mouth daily.    . dabigatran (PRADAXA) 75 MG CAPS capsule TAKE 1 CAPSULE BY MOUTH EVERY 12 HOURS. 48 capsule 0  . levothyroxine (SYNTHROID, LEVOTHROID) 112 MCG tablet Take  112 mcg by mouth daily before breakfast.    . magnesium oxide (MAG-OX) 400 MG tablet Take 400 mg by mouth daily.    . metoprolol (LOPRESSOR) 50 MG tablet TAKE 1 TABLET BY MOUTH TWICE DAILY. 180 tablet 3  . niacin (NIASPAN) 1000 MG CR tablet TAKE (1) TABLET BY MOUTH AT BEDTIME. 30 tablet 11  . oxyCODONE-acetaminophen (PERCOCET) 10-325 MG per tablet Take 1 tablet by mouth every 4 (four) hours as needed for pain.    . pioglitazone (ACTOS) 45 MG tablet Take 45 mg by mouth daily.    .  ramipril (ALTACE) 2.5 MG capsule Take 2.5 mg by mouth at bedtime.     Marland Kitchen ZETIA 10 MG tablet TAKE 1 TABLET ONCE DAILY FOR CHOLESTEROL. 30 tablet 11  . zolpidem (AMBIEN) 10 MG tablet Take 10 mg by mouth at bedtime as needed for sleep.    Marland Kitchen digoxin (LANOXIN) 0.125 MG tablet Take 0.125 mg by mouth daily.     . cefTRIAXone 2 g in dextrose 5 % 50 mL Inject 2 g into the vein daily. (Patient not taking: Reported on 03/06/2015) 30 Dose 0   No facility-administered medications prior to visit.     Allergies:   Review of patient's allergies indicates no known allergies.   Social History   Social History  . Marital Status: Married    Spouse Name: N/A  . Number of Children: N/A  . Years of Education: N/A   Social History Main Topics  . Smoking status: Former Smoker -- 1.50 packs/day for 35 years    Types: Cigarettes    Quit date: 11/03/1995  . Smokeless tobacco: Never Used  . Alcohol Use: No  . Drug Use: No  . Sexual Activity: Not Asked   Other Topics Concern  . None   Social History Narrative     Family History:  The patient's family history includes Heart attack in his mother; Stroke in his father.   ROS:   Please see the history of present illness.    ROS All other systems reviewed and are negative.   PHYSICAL EXAM:   VS:  BP 132/76 mmHg  Pulse 61  Ht 6\' 2"  (1.88 m)  Wt 73.755 kg (162 lb 9.6 oz)  BMI 20.87 kg/m2   GEN: Well nourished, well developed, in no acute distress HEENT: normal Neck: no JVD, carotid bruits, or masses Cardiac: Split S2, RRR; no murmurs, rubs, or gallops,no edema; healthy ICD site  Respiratory:  clear to auscultation bilaterally, normal work of breathing GI: soft, nontender, nondistended, + BS MS: no deformity or atrophy Skin: warm and dry, no rash Neuro:  Alert and Oriented x 3, Strength and sensation are intact Psych: euthymic mood, full affect  Wt Readings from Last 3 Encounters:  06/19/15 73.755 kg (162 lb 9.6 oz)  03/06/15 81.647 kg (180 lb)    02/12/15 79.379 kg (175 lb)      Studies/Labs Reviewed:   EKG:  EKG is ordered today.  The ekg ordered today demonstrates A paced V paced rhythm with positive R waves in leads V1-V2  Recent Labs: 01/08/2015: ALT 32 03/06/2015: BUN 15; Creatinine, Ser 1.46*; Hemoglobin 11.6*; Platelets 215; Potassium 4.0; Sodium 139   Lipid Panel    Component Value Date/Time   CHOL  05/07/2009 0826    128        ATP III CLASSIFICATION:  <200     mg/dL   Desirable  200-239  mg/dL   Borderline High  >=240  mg/dL   High          TRIG 46 05/07/2009 0826   HDL 43 05/07/2009 0826   CHOLHDL 3.0 05/07/2009 0826   VLDL 9 05/07/2009 0826   LDLCALC  05/07/2009 0826    76        Total Cholesterol/HDL:CHD Risk Coronary Heart Disease Risk Table                     Men   Women  1/2 Average Risk   3.4   3.3  Average Risk       5.0   4.4  2 X Average Risk   9.6   7.1  3 X Average Risk  23.4   11.0        Use the calculated Patient Ratio above and the CHD Risk Table to determine the patient's CHD Risk.        ATP III CLASSIFICATION (LDL):  <100     mg/dL   Optimal  100-129  mg/dL   Near or Above                    Optimal  130-159  mg/dL   Borderline  160-189  mg/dL   High  >190     mg/dL   Very High     ASSESSMENT:    1. Chronic systolic heart failure ([place])   2. Ventricular tachycardia (HCC)   3. Paroxysmal atrial fibrillation ([place])   4. Coronary artery disease involving native coronary artery of native heart without angina pectoris   5. Biventricular ICD (implantable cardioverter-defibrillator) in place   6. On amiodarone therapy   7. Medication management      PLAN:  In order of problems listed above:  1. CHF: Appears clinically to be euvolemic, NYHA functional class I-II. He is on appropriate treatment with beta blockers and ace inhibitors in maximum tolerated doses he does not require daily loop diuretic therapy. 2. VT: None recorded by his device over the last 12  months 3. PAF:  None recorded by his device over the last 12 months. He should continue anticoagulation since the likelihood of atrial fibrillation recurrence is high and since he also has a history of previous venous thromboembolic disease. Digoxin appears superfluous at this time since there is no need for additional rate control medications and since left ventricular systolic function has shown substantial improvement. Since he is also taking amiodarone, the risk of toxicity/side effects outweighs its benefit. Have asked him to stop digoxin. 4. CAD: Currently free of angina, asymptomatic, 20 years since his bypass procedure. Patent grafts by cardiac catheterization in 2011. Known anteroapical and septal scar without ischemia (nuclear study 2014) 5. CRT-D: He has had an excellent response to biventricular pacing. His device is approaching RRT. This can be done with a very brief interruption in anticoagulation therapy if necessary. Unfortunately, he is very hard of hearing. He may not notice the alarm films from the device when it reaches replacement interval. Will try to talk to his wife to tell her what to listen for. 6. Amiodarone:He is due to have liver function tests and thyroid function studies every 6 months while on amiodarone therapy. He has a scheduled physical with his primary care physician on Friday and will have his lab tests done after that appointment. 7.  Stop digoxin    Medication Adjustments/Labs and Tests Ordered: Current medicines are reviewed at length with the patient today.  Concerns regarding medicines are  outlined above.  Medication changes, Labs and Tests ordered today are listed in the Patient Instructions below. Patient Instructions  Dr Sallyanne Kuster has recommended making the following medication changes: 1. STOP Digoxin  Your physician recommends that you return for lab work on Friday.  Remote monitoring is used to monitor your Pacemaker of ICD from home. This monitoring  reduces the number of office visits required to check your device to one time per year. It allows [place] to keep an eye on the functioning of your device to ensure it is working properly. You are scheduled for a device check from home on September 19, 2015. You may send your transmission at any time that day. If you have a wireless device, the transmission will be sent automatically. After your physician reviews your transmission, you will receive a postcard with your next transmission date.  Dr Sallyanne Kuster recommends that you schedule a follow-up appointment in 6 months with a defibrillator check. You will receive a reminder letter in the mail two months in advance. If you don't receive a letter, please call our office to schedule the follow-up appointment.  If you need a refill on your cardiac medications before your next appointment, please call your pharmacy.    Medication samples have been provided to the patient. Drug name: Pradaxa 75 mg Qty: 56 tabs LOT: [id number] Exp.Date: 04/2017  Donivan Scull 5:08 PM 06/19/2015     Mikael Spray, MD  06/19/2015 7:33 PM    [address], [place], [place]  [address] Phone: [phone]; Fax: [phone]

## 2015-06-19 NOTE — Patient Instructions (Addendum)
Dr Sallyanne Kuster has recommended making the following medication changes: 1. STOP Digoxin  Your physician recommends that you return for lab work on Friday.  Remote monitoring is used to monitor your Pacemaker of ICD from home. This monitoring reduces the number of office visits required to check your device to one time per year. It allows [place] to keep an eye on the functioning of your device to ensure it is working properly. You are scheduled for a device check from home on September 19, 2015. You may send your transmission at any time that day. If you have a wireless device, the transmission will be sent automatically. After your physician reviews your transmission, you will receive a postcard with your next transmission date.  Dr Sallyanne Kuster recommends that you schedule a follow-up appointment in 6 months with a defibrillator check. You will receive a reminder letter in the mail two months in advance. If you don't receive a letter, please call our office to schedule the follow-up appointment.  If you need a refill on your cardiac medications before your next appointment, please call your pharmacy.    Medication samples have been provided to the patient. Drug name: Pradaxa 75 mg Qty: 56 tabs LOT: [id number] Exp.Date: 04/2017  Donivan Scull 5:08 PM 06/19/2015

## 2015-06-21 ENCOUNTER — Other Ambulatory Visit: Payer: Self-pay | Admitting: Cardiovascular Disease

## 2015-06-21 DIAGNOSIS — Z1389 Encounter for screening for other disorder: Secondary | ICD-10-CM | POA: Diagnosis not present

## 2015-06-21 DIAGNOSIS — Z6821 Body mass index (BMI) 21.0-21.9, adult: Secondary | ICD-10-CM | POA: Diagnosis not present

## 2015-06-21 DIAGNOSIS — Z79899 Other long term (current) drug therapy: Secondary | ICD-10-CM | POA: Diagnosis not present

## 2015-06-21 DIAGNOSIS — E119 Type 2 diabetes mellitus without complications: Secondary | ICD-10-CM | POA: Diagnosis not present

## 2015-06-21 DIAGNOSIS — Z0000 Encounter for general adult medical examination without abnormal findings: Secondary | ICD-10-CM | POA: Diagnosis not present

## 2015-06-22 LAB — TSH: TSH: 7.84 u[IU]/mL — ABNORMAL HIGH (ref 0.450–4.500)

## 2015-06-22 LAB — COMPREHENSIVE METABOLIC PANEL
A/G RATIO: 1.2 (ref 1.2–2.2)
ALT: 32 IU/L (ref 0–44)
AST: 37 IU/L (ref 0–40)
Albumin: 3.5 g/dL (ref 3.5–4.8)
Alkaline Phosphatase: 83 IU/L (ref 39–117)
BUN/Creatinine Ratio: 12 (ref 10–24)
BUN: 17 mg/dL (ref 8–27)
Bilirubin Total: 0.5 mg/dL (ref 0.0–1.2)
CALCIUM: 8.5 mg/dL — AB (ref 8.6–10.2)
CO2: 26 mmol/L (ref 18–29)
CREATININE: 1.47 mg/dL — AB (ref 0.76–1.27)
Chloride: 95 mmol/L — ABNORMAL LOW (ref 96–106)
GFR, EST AFRICAN AMERICAN: 55 mL/min/{1.73_m2} — AB (ref >59)
GFR, EST NON AFRICAN AMERICAN: 48 mL/min/{1.73_m2} — AB (ref >59)
GLOBULIN, TOTAL: 2.9 g/dL (ref 1.5–4.5)
Glucose: 133 mg/dL — ABNORMAL HIGH (ref 65–99)
POTASSIUM: 4.5 mmol/L (ref 3.5–5.2)
SODIUM: 138 mmol/L (ref 134–144)
TOTAL PROTEIN: 6.4 g/dL (ref 6.0–8.5)

## 2015-06-27 ENCOUNTER — Telehealth: Payer: Self-pay | Admitting: Cardiovascular Disease

## 2015-06-27 NOTE — Telephone Encounter (Signed)
Pt's wife calling Levada Dy back re lab results-pt hard of hearing-pls call [phone]

## 2015-06-27 NOTE — Telephone Encounter (Addendum)
A user error has taken place: wrong patient

## 2015-06-27 NOTE — Telephone Encounter (Signed)
Returned call to pts wife who is the [facility]. Gave her the recent lab result and to continue current treatment plan at this time. She verbalized understanding.

## 2015-06-28 ENCOUNTER — Telehealth: Payer: Self-pay | Admitting: Cardiovascular Disease

## 2015-06-28 NOTE — Telephone Encounter (Signed)
Called pt's wife, ok per DPR, back. She could not remember getting the results.  Again told her pt's lab results and she verbalized understanding.  Due for 1 year visit with Dr Gwenlyn Found in June, that appt scheduled for pt with wife.

## 2015-06-28 NOTE — Telephone Encounter (Signed)
Follow Up:   Please call,she says she does not remember talking to you and getting the results.

## 2015-07-04 ENCOUNTER — Telehealth: Payer: Self-pay

## 2015-07-04 DIAGNOSIS — Z79899 Other long term (current) drug therapy: Secondary | ICD-10-CM

## 2015-07-04 NOTE — Telephone Encounter (Signed)
-----   Message from Sanda Klein, MD sent at 06/28/2015  8:44 AM EDT ----- Please recheck TSH and free T4 in 1 month

## 2015-07-04 NOTE — Telephone Encounter (Signed)
Notes Recorded by Dionne Bucy Truitt, CMA on 07/04/2015 at 4:18 PM Wife verbalized understanding. Notes Recorded by Dionne Bucy Truitt, CMA on 07/04/2015 at 4:17 PM Called to patient and spoke with wife, Tamela Oddi ([place]). Wife aware of Dr Lurline Del recommendations. Advised that Mr Coman have blood work done at the [facility] in [place] in ~2-3 weeks.

## 2015-07-29 ENCOUNTER — Ambulatory Visit: Payer: Medicare Other | Admitting: *Deleted

## 2015-07-29 DIAGNOSIS — Z95810 Presence of automatic (implantable) cardiac defibrillator: Secondary | ICD-10-CM

## 2015-07-29 NOTE — Progress Notes (Addendum)
Remote ICD transmission.   

## 2015-07-31 ENCOUNTER — Telehealth: Payer: Self-pay | Admitting: Cardiology

## 2015-07-31 NOTE — Telephone Encounter (Signed)
LMOVM for pt to return my call. His device reached RRT on 07-31-2015.

## 2015-08-06 NOTE — Telephone Encounter (Signed)
2nd attempt.   LM w/ pt wife for pt to return my call.

## 2015-08-07 NOTE — Telephone Encounter (Signed)
Spoke w/ pt wife and informed her that pt device had reached ERI and that a scheduler call and schedule an appt w/  MD / PA / NP. Pt wife verbalized understanding.

## 2015-08-16 LAB — CUP PACEART REMOTE DEVICE CHECK
Brady Statistic AP VS Percent: 0.02 %
Brady Statistic AS VP Percent: 22.51 %
HighPow Impedance: 41 Ohm
HighPow Impedance: 49 Ohm
Implantable Lead Implant Date: 20110224
Implantable Lead Implant Date: 20110224
Implantable Lead Location: 753858
Implantable Lead Model: 6947
Lead Channel Impedance Value: 437 Ohm
Lead Channel Impedance Value: 475 Ohm
Lead Channel Impedance Value: 532 Ohm
Lead Channel Impedance Value: 855 Ohm
Lead Channel Pacing Threshold Amplitude: 1 V
Lead Channel Sensing Intrinsic Amplitude: 8.625 mV
Lead Channel Setting Pacing Amplitude: 2 V
Lead Channel Setting Pacing Amplitude: 2.5 V
Lead Channel Setting Pacing Pulse Width: 0.4 ms
MDC IDC LEAD IMPLANT DT: 20110224
MDC IDC LEAD LOCATION: 753859
MDC IDC LEAD LOCATION: 753860
MDC IDC LEAD MODEL: 4196
MDC IDC MSMT BATTERY VOLTAGE: 2.62 V
MDC IDC MSMT LEADCHNL LV PACING THRESHOLD PULSEWIDTH: 0.4 ms
MDC IDC MSMT LEADCHNL RA SENSING INTR AMPL: 0.625 mV
MDC IDC MSMT LEADCHNL RA SENSING INTR AMPL: 0.625 mV
MDC IDC MSMT LEADCHNL RV IMPEDANCE VALUE: 475 Ohm
MDC IDC MSMT LEADCHNL RV SENSING INTR AMPL: 8.625 mV
MDC IDC SESS DTM: 20170515083827
MDC IDC SET LEADCHNL LV PACING PULSEWIDTH: 0.4 ms
MDC IDC SET LEADCHNL RA PACING AMPLITUDE: 2.5 V
MDC IDC SET LEADCHNL RV SENSING SENSITIVITY: 0.3 mV
MDC IDC STAT BRADY AP VP PERCENT: 77.46 %
MDC IDC STAT BRADY AS VS PERCENT: 0.01 %
MDC IDC STAT BRADY RA PERCENT PACED: 77.49 %
MDC IDC STAT BRADY RV PERCENT PACED: 99.97 %

## 2015-08-20 ENCOUNTER — Ambulatory Visit (INDEPENDENT_AMBULATORY_CARE_PROVIDER_SITE_OTHER): Payer: Medicare Other | Admitting: Cardiovascular Disease

## 2015-08-20 ENCOUNTER — Encounter: Payer: Self-pay | Admitting: Cardiovascular Disease

## 2015-08-20 VITALS — BP 141/76 | HR 96 | Ht 74.0 in | Wt 172.6 lb

## 2015-08-20 DIAGNOSIS — I5022 Chronic systolic (congestive) heart failure: Secondary | ICD-10-CM

## 2015-08-20 DIAGNOSIS — Z8679 Personal history of other diseases of the circulatory system: Secondary | ICD-10-CM

## 2015-08-20 DIAGNOSIS — I480 Paroxysmal atrial fibrillation: Secondary | ICD-10-CM

## 2015-08-20 DIAGNOSIS — I2510 Atherosclerotic heart disease of native coronary artery without angina pectoris: Secondary | ICD-10-CM | POA: Diagnosis not present

## 2015-08-20 DIAGNOSIS — E785 Hyperlipidemia, unspecified: Secondary | ICD-10-CM

## 2015-08-20 DIAGNOSIS — I2583 Coronary atherosclerosis due to lipid rich plaque: Secondary | ICD-10-CM

## 2015-08-20 NOTE — Progress Notes (Signed)
08/20/2015 [place]   1945/06/17  [id number]  Primary Physician Glo Herring., MD Primary Cardiologist: Lorretta Harp MD [name]   HPI:  The patient is a 70 year old mildly overweight married Caucasian male, father of 2, who I last saw in the office 08/15/14.Marland Kitchen He has a history of ischemic cardiomyopathy, status post coronary artery bypass grafting in 1997. He was catheterized by Dr. Rex Kras, May 08, 2009, revealing patent grafts with moderate LV dysfunction, unchanged anatomy. 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 His other problems include hypertension, hyperlipidemia, and type 2 diabetes. He has a BiV ICD, implanted by Dr. Cristopher Peru, which Dr. Sallyanne Kuster follows on a quarterly outpatient basis.he had a Myoview stress test performed 03/17/12 that showed scar in the anterior wall apex and septum with an EF of 42%. He is currently asymptomatic. Because of cellulitis and concern about potential endocarditis with indwelling leads he underwent a TEE by Dr. Jacinta Shoe  01/03/15 revealing normal LV systolic function.   Current Outpatient Prescriptions  Medication Sig Dispense Refill  . ALPRAZolam (XANAX) 1 MG tablet Take 1 mg by mouth daily as needed for anxiety or sleep.     Marland Kitchen amiodarone (PACERONE) 200 MG tablet Take 200 mg by mouth daily.    Marland Kitchen aspirin EC 81 MG tablet Take 81 mg by mouth at bedtime.    Marland Kitchen atorvastatin (LIPITOR) 40 MG tablet Take 40 mg by mouth daily.    . dabigatran (PRADAXA) 75 MG CAPS capsule TAKE 1 CAPSULE BY MOUTH EVERY 12 HOURS. 48 capsule 0  . levothyroxine (SYNTHROID, LEVOTHROID) 112 MCG tablet Take 112 mcg by mouth daily before breakfast.    . magnesium oxide (MAG-OX) 400 MG tablet Take 400 mg by mouth daily.    . metoprolol (LOPRESSOR) 50 MG tablet TAKE 1 TABLET BY MOUTH TWICE DAILY. 180 tablet 3  . niacin (NIASPAN) 1000 MG CR  tablet TAKE (1) TABLET BY MOUTH AT BEDTIME. 30 tablet 11  . oxyCODONE-acetaminophen (PERCOCET) 10-325 MG per tablet Take 1 tablet by mouth every 4 (four) hours as needed for pain.    . pioglitazone (ACTOS) 45 MG tablet Take 45 mg by mouth daily.    . ramipril (ALTACE) 2.5 MG capsule Take 2.5 mg by mouth at bedtime.     Marland Kitchen ZETIA 10 MG tablet TAKE 1 TABLET ONCE DAILY FOR CHOLESTEROL. 30 tablet 11  . zolpidem (AMBIEN) 10 MG tablet Take 10 mg by mouth at bedtime as needed for sleep.     No current facility-administered medications for this visit.    No Known Allergies  Social History   Social History  . Marital Status: Married    Spouse Name: N/A  . Number of Children: N/A  . Years of Education: N/A   Occupational History  . Not on file.   Social History Main Topics  . Smoking status: Former Smoker -- 1.50 packs/day for 35 years    Types: Cigarettes    Quit date: 11/03/1995  . Smokeless tobacco: Never Used  . Alcohol Use: No  . Drug Use: No  . Sexual Activity: Not on file   Other Topics Concern  . Not on file   Social History Narrative     Review of Systems: General: negative for chills, fever, night sweats or weight changes.  Cardiovascular: negative for chest pain, dyspnea on exertion, edema, orthopnea, palpitations, paroxysmal nocturnal dyspnea or shortness  of breath Dermatological: negative for rash Respiratory: negative for cough or wheezing Urologic: negative for hematuria Abdominal: negative for nausea, vomiting, diarrhea, bright red blood per rectum, melena, or hematemesis Neurologic: negative for visual changes, syncope, or dizziness All other systems reviewed and are otherwise negative except as noted above.    Blood pressure 141/76, pulse 96, height 6\' 2"  (1.88 m), weight 172 lb 9.6 oz (78.291 kg).  General appearance: alert and no distress Neck: no adenopathy, no carotid bruit, no JVD, supple, symmetrical, trachea midline and thyroid not enlarged, symmetric,  no tenderness/mass/nodules Lungs: clear to auscultation bilaterally Heart: regular rate and rhythm, S1, S2 normal, no murmur, click, rub or gallop Extremities: extremities normal, atraumatic, no cyanosis or edema  EKG not performed today  ASSESSMENT AND PLAN:   Chronic systolic heart failure History of chronic systolic heart failure in the past status post bi-V ICD implantation by Dr. Lovena Le recent transesophageal echo performed 01/11/15 revealing normal LV systolic function The patient has no heart failure symptoms currently.  CAD s/p CABG  istory of CAD status post coronary artery bypass grafting by Dr. Servando Snare in 1997 with a LIMA to the LAD, vein graft to the ramus intermedius, circumflex, PDA and PLA sequentially. He had cardiac catheterization performed by Dr. Rex Kras 05/08/09 feeling widely patent grafts with moderate LV dysfunction. Myoview performed 03/17/12 showed apical scar without ischemia.  Hyperlipidemia History of hyperlipidemia on statin therapy followed by his PCP  Paroxysmal atrial fibrillation (HCC) History of paroxysmal atrial fibrillation maintaining sinus rhythm on amiodarone and Pradaxa.  History of carotid artery disease History of mild bilateral ICA stenosis by duplex ultrasound 02/03/13.      Lorretta Harp MD FACP,FACC,FAHA, [facility] 08/20/2015 3:07 PM

## 2015-08-20 NOTE — Assessment & Plan Note (Signed)
History of hyperlipidemia on statin therapy followed by his PCP 

## 2015-08-20 NOTE — Assessment & Plan Note (Signed)
istory of CAD status post coronary artery bypass grafting by Dr. Servando Snare in 1997 with a LIMA to the LAD, vein graft to the ramus intermedius, circumflex, PDA and PLA sequentially. He had cardiac catheterization performed by Dr. Rex Kras 05/08/09 feeling widely patent grafts with moderate LV dysfunction. Myoview performed 03/17/12 showed apical scar without ischemia.

## 2015-08-20 NOTE — Assessment & Plan Note (Signed)
History of chronic systolic heart failure in the past status post bi-V ICD implantation by Dr. Lovena Le recent transesophageal echo performed 01/11/15 revealing normal LV systolic function The patient has no heart failure symptoms currently.

## 2015-08-20 NOTE — Patient Instructions (Signed)

## 2015-08-20 NOTE — Assessment & Plan Note (Signed)
History of mild bilateral ICA stenosis by duplex ultrasound 02/03/13.

## 2015-08-20 NOTE — Assessment & Plan Note (Signed)
History of paroxysmal atrial fibrillation maintaining sinus rhythm on amiodarone and Pradaxa.

## 2015-08-22 ENCOUNTER — Encounter: Payer: Medicare Other | Admitting: Nurse Practitioner

## 2015-08-23 ENCOUNTER — Encounter: Payer: Medicare Other | Admitting: Nurse Practitioner

## 2015-08-23 NOTE — Progress Notes (Signed)
This encounter was created in error - please disregard.

## 2015-08-28 ENCOUNTER — Encounter: Payer: Self-pay | Admitting: Cardiology

## 2015-09-13 ENCOUNTER — Encounter: Payer: Self-pay | Admitting: Nurse Practitioner

## 2015-09-13 ENCOUNTER — Encounter: Payer: Self-pay | Admitting: *Deleted

## 2015-09-13 ENCOUNTER — Ambulatory Visit (INDEPENDENT_AMBULATORY_CARE_PROVIDER_SITE_OTHER): Payer: Medicare Other | Admitting: Nurse Practitioner

## 2015-09-13 VITALS — BP 120/48 | HR 76 | Ht 74.0 in | Wt 176.4 lb

## 2015-09-13 DIAGNOSIS — I10 Essential (primary) hypertension: Secondary | ICD-10-CM | POA: Diagnosis not present

## 2015-09-13 DIAGNOSIS — I2510 Atherosclerotic heart disease of native coronary artery without angina pectoris: Secondary | ICD-10-CM

## 2015-09-13 DIAGNOSIS — I5022 Chronic systolic (congestive) heart failure: Secondary | ICD-10-CM

## 2015-09-13 DIAGNOSIS — R001 Bradycardia, unspecified: Secondary | ICD-10-CM

## 2015-09-13 LAB — CUP PACEART INCLINIC DEVICE CHECK
Implantable Lead Implant Date: 20110224
Implantable Lead Implant Date: 20110224
Implantable Lead Implant Date: 20110224
Implantable Lead Location: 753858
Implantable Lead Location: 753860
Implantable Lead Model: 4196
Implantable Lead Model: 6947
MDC IDC LEAD LOCATION: 753859
MDC IDC SESS DTM: 20170630095622

## 2015-09-13 NOTE — Progress Notes (Signed)
Electrophysiology Office Note Date: 09/13/2015  ID:  Kegan, Guseman 18-Mar-1945, MRN [id number]  PCP: Glo Herring., MD Primary Cardiologist: Berry/Croitoru Electrophysiologist: Lovena Le  CC: discuss generator change  JENNIFER QUINTOS is a 70 y.o. male seen today for Dr Sallyanne Kuster.  He presents today to discuss gen change of CRTD.  Since last being seen in our clinic, the patient reports doing very well. He denies chest pain, palpitations, dyspnea, PND, orthopnea, nausea, vomiting, dizziness, syncope, edema, weight gain, or early satiety.  He has not had ICD shocks.   Device History: MDT CRTD implanted 2011 for ICM, CHF History of appropriate therapy: No History of AAD therapy: No   Past Medical History  Diagnosis Date  . Ischemic cardiomyopathy   . CAD (coronary artery disease)   . S/P CABG x 5   . PAF (paroxysmal atrial fibrillation) ([place])   . Hypertension   . Hyperlipidemia   . RBBB   . ICD (implantable cardioverter-defibrillator), single, in situ   . DM (diabetes mellitus) ([place])     type 2   . DVT (deep venous thrombosis) [facility])    Past Surgical History  Procedure Laterality Date  . Coronary artery bypass graft  12/31/1995    LIMA to LAD,SVG to intermediate,SVG to CX,seq. svg to posterior descending and posterolateral RCA  . Cardiac catheterization  05/08/2009    small vessel disease  . Cardiac defibrillator placement  05/09/2009    [employer]  . Back surgery    . I&d extremity Left 11/03/2013    Procedure: Left Leg Debride Ulcer, Apply Wound VAC and Theraskin;  Surgeon: Newt Minion, MD;  Location: [place];  Service: Orthopedics;  Laterality: Left;  . Tee without cardioversion N/A 01/11/2015    Procedure: TRANSESOPHAGEAL ECHOCARDIOGRAM (TEE);  Surgeon: Herminio Commons, MD;  [facility];  Service: Cardiology;  Laterality: N/A;    Current Outpatient Prescriptions  Medication Sig Dispense Refill  . ALPRAZolam (XANAX) 1 MG tablet Take 1 mg by  mouth daily as needed for anxiety or sleep.     Marland Kitchen amiodarone (PACERONE) 200 MG tablet Take 200 mg by mouth daily.    Marland Kitchen aspirin EC 81 MG tablet Take 81 mg by mouth at bedtime.    Marland Kitchen atorvastatin (LIPITOR) 40 MG tablet Take 40 mg by mouth daily.    . dabigatran (PRADAXA) 75 MG CAPS capsule TAKE 1 CAPSULE BY MOUTH EVERY 12 HOURS. 48 capsule 0  . levothyroxine (SYNTHROID, LEVOTHROID) 112 MCG tablet Take 112 mcg by mouth daily before breakfast.    . magnesium oxide (MAG-OX) 400 MG tablet Take 400 mg by mouth daily.    . metoprolol (LOPRESSOR) 50 MG tablet TAKE 1 TABLET BY MOUTH TWICE DAILY. 180 tablet 3  . niacin (NIASPAN) 1000 MG CR tablet TAKE (1) TABLET BY MOUTH AT BEDTIME. 30 tablet 11  . oxyCODONE-acetaminophen (PERCOCET) 10-325 MG per tablet Take 1 tablet by mouth every 4 (four) hours as needed for pain.    . pioglitazone (ACTOS) 45 MG tablet Take 45 mg by mouth daily.    . ramipril (ALTACE) 2.5 MG capsule Take 2.5 mg by mouth at bedtime.     Marland Kitchen ZETIA 10 MG tablet TAKE 1 TABLET ONCE DAILY FOR CHOLESTEROL. 30 tablet 11  . zolpidem (AMBIEN) 10 MG tablet Take 10 mg by mouth at bedtime as needed for sleep.     No current facility-administered medications for this visit.    Allergies:   Review of patient's allergies  indicates no known allergies.   Social History: Social History   Social History  . Marital Status: Married    Spouse Name: N/A  . Number of Children: N/A  . Years of Education: N/A   Occupational History  . Not on file.   Social History Main Topics  . Smoking status: Former Smoker -- 1.50 packs/day for 35 years    Types: Cigarettes    Quit date: 11/03/1995  . Smokeless tobacco: Never Used  . Alcohol Use: No  . Drug Use: No  . Sexual Activity: Not on file   Other Topics Concern  . Not on file   Social History Narrative    Family History: Family History  Problem Relation Age of Onset  . Heart attack Mother   . Stroke Father     Review of Systems: All other  systems reviewed and are otherwise negative except as noted above.   Physical Exam: VS:  BP 120/48 mmHg  Pulse 76  Ht 6\' 2"  (1.88 m)  Wt 176 lb 6.4 oz (80.015 kg)  BMI 22.64 kg/m2  SpO2 98% , BMI Body mass index is 22.64 kg/(m^2).  GEN- The patient is elderly appearing, alert and oriented x 3 today, hard of hearing  HEENT: normocephalic, atraumatic; sclera clear, conjunctiva pink; hearing intact; oropharynx clear; neck supple  Lungs- Clear to ausculation bilaterally, normal work of breathing.  No wheezes, rales, rhonchi Heart- Regular rate and rhythm (paced) GI- soft, non-tender, non-distended, bowel sounds present  Extremities- no clubbing, cyanosis, or edema  MS- no significant deformity or atrophy Skin- warm and dry, no rash or lesion; ICD pocket well healed Psych- euthymic mood, full affect Neuro- strength and sensation are intact  ICD interrogation- reviewed in detail today,  See PACEART report  EKG:  EKG is ordered today. The ekg ordered today shows AV pacing   Recent Labs: 03/06/2015: Hemoglobin 11.6*; Platelets 215 06/21/2015: ALT 32; BUN 17; Creatinine, Ser 1.47*; Potassium 4.5; Sodium 138; TSH 7.840*   Wt Readings from Last 3 Encounters:  09/13/15 176 lb 6.4 oz (80.015 kg)  08/20/15 172 lb 9.6 oz (78.291 kg)  06/19/15 162 lb 9.6 oz (73.755 kg)     Other studies Reviewed: Additional studies/ records that were reviewed today include: Dr Croitoru's office notes, Dr Kennon Holter office notes, echo   Assessment and Plan:  1.  Chronic systolic dysfunction euvolemic today Stable on an appropriate medical regimen EF normalized post CRTD implant. Device at [facility] today. There is no indication to continue with ICD, CRTP gen change recommended.  Risks, benefits discussed with patient today who wishes to proceed. He would prefer that Dr Lovena Le do his generator change as he implanted original device. Will schedule at next available time. Hold Pradaxa for 2 doses prior to  procedure  2.  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  HTN Stable No change required today  4. CAD No recent ischemic symptoms Continue current therapy    Current medicines are reviewed at length with the patient today.   The patient does not have concerns regarding his medicines.  The following changes were made today:  none  Labs/ tests ordered today include: pre-procedure labs   No orders of the defined types were placed in this encounter.     Disposition:   Follow up with Dr C following gen change    Signed, [name], NP 09/13/2015 9:25 AM  [place]  [address] [place] [address] [phone] (office) [phone] (fax)

## 2015-09-13 NOTE — Patient Instructions (Addendum)
Medication Instructions:   Your physician recommends that you continue on your current medications as directed. Please refer to the Current Medication list given to you today.   If you need a refill on your cardiac medications before your next appointment, please call your pharmacy.  Labwork: CBC AND BMET  RETURN ON 09/20/2015.Marland Kitchen   Testing/Procedures: SEE UNDER SPECIAL INSTRUCTIONS   Follow-Up:  After 7/12/207..[place] WITH DEVICE CLINIC                       After 09/25/2015..90 DAY PHY DEFIB WITH DR Lovena Le   Any Other Special Instructions Will Be Listed Below (If Applicable)                                           Implantable Device Instructions  You are scheduled for: Generator Change for your  Implantable Cardioverter Device                                   on  09/25/2015.  with Dr. Lovena Le 12 PM  1.   Please arrive at the [facility] [place] [facility] at [facility] at 10 AM  on the day of your       procedure.  2. Do not eat or drink the night before your procedure after midnight.  3.   Complete lab work on 09/20/2015...  The lab at [facility] is open from 8:30 AM to        1:30 PM and from 2:30 PM to 5:00 PM.  .  4.   Do NOT take these medications : PRADAXA  NIGHT BEFORE AND MORNING OF your procedure  5.  Plan for an overnight stay.  Bring your insurance cards and a list of you medications.  6.  Wash your chest and neck with antibacterial soap (any brand) the evening before and the morning of your procedure.  Rinse well. Chg antiseptic cleaner included with instructions  * If you have ANY questions after you get home, please call the office [phone]  * Every attempt is made to prevent procedures from being rescheduled.  Due to the nature of    Electrophysiology, rescheduling can happen.  The physician is always aware and directs the staff when this   occurs.    Elby Beck. [facility] CMA

## 2015-09-16 ENCOUNTER — Other Ambulatory Visit: Payer: Self-pay | Admitting: Cardiovascular Disease

## 2015-09-20 ENCOUNTER — Other Ambulatory Visit (INDEPENDENT_AMBULATORY_CARE_PROVIDER_SITE_OTHER): Payer: Medicare Other | Admitting: *Deleted

## 2015-09-20 DIAGNOSIS — I5022 Chronic systolic (congestive) heart failure: Secondary | ICD-10-CM | POA: Diagnosis not present

## 2015-09-20 LAB — BASIC METABOLIC PANEL
BUN: 22 mg/dL (ref 7–25)
CALCIUM: 8.4 mg/dL — AB (ref 8.6–10.3)
CO2: 28 mmol/L (ref 20–31)
CREATININE: 1.42 mg/dL — AB (ref 0.70–1.18)
Chloride: 103 mmol/L (ref 98–110)
GLUCOSE: 109 mg/dL — AB (ref 65–99)
Potassium: 4.1 mmol/L (ref 3.5–5.3)
SODIUM: 137 mmol/L (ref 135–146)

## 2015-09-20 LAB — CBC
HCT: 33.9 % — ABNORMAL LOW (ref 38.5–50.0)
HEMOGLOBIN: 11.5 g/dL — AB (ref 13.2–17.1)
MCH: 33.7 pg — AB (ref 27.0–33.0)
MCHC: 33.9 g/dL (ref 32.0–36.0)
MCV: 99.4 fL (ref 80.0–100.0)
MPV: 10.7 fL (ref 7.5–12.5)
Platelets: 201 10*3/uL (ref 140–400)
RBC: 3.41 MIL/uL — AB (ref 4.20–5.80)
RDW: 15.9 % — ABNORMAL HIGH (ref 11.0–15.0)
WBC: 5.9 10*3/uL (ref 3.8–10.8)

## 2015-09-20 NOTE — Addendum Note (Signed)
Addended by: Eulis Foster on: 09/20/2015 10:04 AM   Modules accepted: Orders

## 2015-09-24 ENCOUNTER — Telehealth: Payer: Self-pay | Admitting: *Deleted

## 2015-09-24 NOTE — Telephone Encounter (Signed)
-----   Message from Patsey Berthold, NP sent at 09/20/2015  5:50 PM EDT ----- Please notify patient of stable labs

## 2015-09-25 ENCOUNTER — Encounter (HOSPITAL_COMMUNITY)
Admission: RE | Disposition: A | Discharge status: Home / Self Care | Payer: Self-pay | Source: Ambulatory Visit | Attending: Internal Medicine

## 2015-09-25 ENCOUNTER — Ambulatory Visit (HOSPITAL_COMMUNITY)
Admission: RE | Admit: 2015-09-25 | Discharge: 2015-09-25 | Disposition: A | Discharge status: Home / Self Care | Payer: Medicare Other | Source: Ambulatory Visit | Attending: Internal Medicine | Admitting: Internal Medicine

## 2015-09-25 DIAGNOSIS — Z951 Presence of aortocoronary bypass graft: Secondary | ICD-10-CM | POA: Diagnosis not present

## 2015-09-25 DIAGNOSIS — Z823 Family history of stroke: Secondary | ICD-10-CM | POA: Insufficient documentation

## 2015-09-25 DIAGNOSIS — I4510 Unspecified right bundle-branch block: Secondary | ICD-10-CM | POA: Diagnosis not present

## 2015-09-25 DIAGNOSIS — I480 Paroxysmal atrial fibrillation: Secondary | ICD-10-CM | POA: Diagnosis not present

## 2015-09-25 DIAGNOSIS — R001 Bradycardia, unspecified: Secondary | ICD-10-CM | POA: Insufficient documentation

## 2015-09-25 DIAGNOSIS — Z7901 Long term (current) use of anticoagulants: Secondary | ICD-10-CM | POA: Diagnosis not present

## 2015-09-25 DIAGNOSIS — I255 Ischemic cardiomyopathy: Secondary | ICD-10-CM | POA: Insufficient documentation

## 2015-09-25 DIAGNOSIS — I110 Hypertensive heart disease with heart failure: Secondary | ICD-10-CM | POA: Diagnosis not present

## 2015-09-25 DIAGNOSIS — I2510 Atherosclerotic heart disease of native coronary artery without angina pectoris: Secondary | ICD-10-CM | POA: Diagnosis not present

## 2015-09-25 DIAGNOSIS — Z7982 Long term (current) use of aspirin: Secondary | ICD-10-CM | POA: Insufficient documentation

## 2015-09-25 DIAGNOSIS — Z86718 Personal history of other venous thrombosis and embolism: Secondary | ICD-10-CM | POA: Insufficient documentation

## 2015-09-25 DIAGNOSIS — E119 Type 2 diabetes mellitus without complications: Secondary | ICD-10-CM | POA: Diagnosis not present

## 2015-09-25 DIAGNOSIS — Z87891 Personal history of nicotine dependence: Secondary | ICD-10-CM | POA: Diagnosis not present

## 2015-09-25 DIAGNOSIS — Z4502 Encounter for adjustment and management of automatic implantable cardiac defibrillator: Secondary | ICD-10-CM | POA: Diagnosis not present

## 2015-09-25 DIAGNOSIS — I442 Atrioventricular block, complete: Secondary | ICD-10-CM | POA: Diagnosis not present

## 2015-09-25 DIAGNOSIS — E785 Hyperlipidemia, unspecified: Secondary | ICD-10-CM | POA: Insufficient documentation

## 2015-09-25 DIAGNOSIS — Z8249 Family history of ischemic heart disease and other diseases of the circulatory system: Secondary | ICD-10-CM | POA: Diagnosis not present

## 2015-09-25 DIAGNOSIS — I5022 Chronic systolic (congestive) heart failure: Secondary | ICD-10-CM | POA: Diagnosis not present

## 2015-09-25 HISTORY — PX: EP IMPLANTABLE DEVICE: SHX172B

## 2015-09-25 LAB — GLUCOSE, CAPILLARY: Glucose-Capillary: 86 mg/dL (ref 65–99)

## 2015-09-25 LAB — SURGICAL PCR SCREEN
MRSA, PCR: NEGATIVE
STAPHYLOCOCCUS AUREUS: POSITIVE — AB

## 2015-09-25 SURGERY — PPM/BIV PPM GENERATOR CHANGEOUT
Anesthesia: LOCAL

## 2015-09-25 MED ORDER — SODIUM CHLORIDE 0.9 % IR SOLN
Status: AC
  Filled 2015-09-25: qty 2

## 2015-09-25 MED ORDER — CHLORHEXIDINE GLUCONATE 4 % EX LIQD: 60.0000 mL | Freq: Once | CUTANEOUS | Status: DC

## 2015-09-25 MED ORDER — LIDOCAINE HCL (PF) 1 % IJ SOLN
INTRAMUSCULAR | Status: DC | PRN
  Administered 2015-09-25: 45 mL via INTRADERMAL

## 2015-09-25 MED ORDER — MUPIROCIN 2 % EX OINT
TOPICAL_OINTMENT | CUTANEOUS | Status: AC
  Administered 2015-09-25: 1 via TOPICAL
  Filled 2015-09-25: qty 22

## 2015-09-25 MED ORDER — FENTANYL CITRATE (PF) 100 MCG/2ML IJ SOLN
INTRAMUSCULAR | Status: DC | PRN
  Administered 2015-09-25 (×4): 12.5 ug via INTRAVENOUS

## 2015-09-25 MED ORDER — MUPIROCIN 2 % EX OINT
1.0000 "application " | TOPICAL_OINTMENT | Freq: Once | CUTANEOUS | Status: AC
  Administered 2015-09-25: 1 via TOPICAL

## 2015-09-25 MED ORDER — MIDAZOLAM HCL 5 MG/5ML IJ SOLN
INTRAMUSCULAR | Status: AC
  Filled 2015-09-25: qty 5

## 2015-09-25 MED ORDER — SODIUM CHLORIDE 0.9 % IV SOLN
INTRAVENOUS | Status: DC
  Administered 2015-09-25: 10:00:00 via INTRAVENOUS

## 2015-09-25 MED ORDER — FENTANYL CITRATE (PF) 100 MCG/2ML IJ SOLN
INTRAMUSCULAR | Status: AC
  Filled 2015-09-25: qty 2

## 2015-09-25 MED ORDER — ACETAMINOPHEN 325 MG PO TABS: 325.0000 mg | ORAL_TABLET | ORAL | Status: DC | PRN

## 2015-09-25 MED ORDER — CEFAZOLIN SODIUM-DEXTROSE 2-4 GM/100ML-% IV SOLN
2.0000 g | INTRAVENOUS | Status: AC
  Administered 2015-09-25: 2 g via INTRAVENOUS

## 2015-09-25 MED ORDER — LIDOCAINE HCL (PF) 1 % IJ SOLN
INTRAMUSCULAR | Status: AC
  Filled 2015-09-25: qty 30

## 2015-09-25 MED ORDER — CEFAZOLIN SODIUM-DEXTROSE 2-4 GM/100ML-% IV SOLN
INTRAVENOUS | Status: AC
  Filled 2015-09-25: qty 100

## 2015-09-25 MED ORDER — MIDAZOLAM HCL 5 MG/5ML IJ SOLN
INTRAMUSCULAR | Status: DC | PRN
  Administered 2015-09-25 (×4): 1 mg via INTRAVENOUS

## 2015-09-25 MED ORDER — ONDANSETRON HCL 4 MG/2ML IJ SOLN: 4.0000 mg | Freq: Four times a day (QID) | INTRAMUSCULAR | Status: DC | PRN

## 2015-09-25 MED ORDER — SODIUM CHLORIDE 0.9 % IR SOLN
80.0000 mg | Status: AC
  Administered 2015-09-25: 80 mg

## 2015-09-25 SURGICAL SUPPLY — 4 items
CABLE SURGICAL S-101-97-12 (CABLE) ×2 IMPLANT
PAD DEFIB LIFELINK (PAD) ×2 IMPLANT
PPM CONSULTA CRT-P C4TR01 (Pacemaker) ×2 IMPLANT
TRAY PACEMAKER INSERTION (PACKS) ×2 IMPLANT

## 2015-09-25 NOTE — H&P (Signed)
Electrophysiology Office Note Date: 09/13/2015  ID: Alan Orr, Alan Orr 28-May-1945, MRN [id number]  PCP: Glo Herring., MD Primary Cardiologist: Berry/Croitoru Electrophysiologist: Lovena Le  CC: discuss generator change  Alan Orr is a 70 y.o. male seen today for Dr Sallyanne Kuster. He presents today to discuss gen change of CRTD. Since last being seen in our clinic, the patient reports doing very well. He denies chest pain, palpitations, dyspnea, PND, orthopnea, nausea, vomiting, dizziness, syncope, edema, weight gain, or early satiety. He has not had ICD shocks.   Device History: MDT CRTD implanted 2011 for ICM, CHF History of appropriate therapy: No History of AAD therapy: No   Past Medical History  Diagnosis Date  . Ischemic cardiomyopathy   . CAD (coronary artery disease)   . S/P CABG x 5   . PAF (paroxysmal atrial fibrillation) ([place])   . Hypertension   . Hyperlipidemia   . RBBB   . ICD (implantable cardioverter-defibrillator), single, in situ   . DM (diabetes mellitus) ([place])     type 2   . DVT (deep venous thrombosis) [facility])    Past Surgical History  Procedure Laterality Date  . Coronary artery bypass graft  12/31/1995    LIMA to LAD,SVG to intermediate,SVG to CX,seq. svg to posterior descending and posterolateral RCA  . Cardiac catheterization  05/08/2009    small vessel disease  . Cardiac defibrillator placement  05/09/2009    [employer]  . Back surgery    . I&d extremity Left 11/03/2013    Procedure: Left Leg Debride Ulcer, Apply Wound VAC and Theraskin; Surgeon: Newt Minion, MD; Location: [place]; Service: Orthopedics; Laterality: Left;  . Tee without cardioversion N/A 01/11/2015    Procedure: TRANSESOPHAGEAL ECHOCARDIOGRAM (TEE); Surgeon: Herminio Commons, MD; [facility]; Service: Cardiology; Laterality: N/A;    Current Outpatient Prescriptions   Medication Sig Dispense Refill  . ALPRAZolam (XANAX) 1 MG tablet Take 1 mg by mouth daily as needed for anxiety or sleep.     Marland Kitchen amiodarone (PACERONE) 200 MG tablet Take 200 mg by mouth daily.    Marland Kitchen aspirin EC 81 MG tablet Take 81 mg by mouth at bedtime.    Marland Kitchen atorvastatin (LIPITOR) 40 MG tablet Take 40 mg by mouth daily.    . dabigatran (PRADAXA) 75 MG CAPS capsule TAKE 1 CAPSULE BY MOUTH EVERY 12 HOURS. 48 capsule 0  . levothyroxine (SYNTHROID, LEVOTHROID) 112 MCG tablet Take 112 mcg by mouth daily before breakfast.    . magnesium oxide (MAG-OX) 400 MG tablet Take 400 mg by mouth daily.    . metoprolol (LOPRESSOR) 50 MG tablet TAKE 1 TABLET BY MOUTH TWICE DAILY. 180 tablet 3  . niacin (NIASPAN) 1000 MG CR tablet TAKE (1) TABLET BY MOUTH AT BEDTIME. 30 tablet 11  . oxyCODONE-acetaminophen (PERCOCET) 10-325 MG per tablet Take 1 tablet by mouth every 4 (four) hours as needed for pain.    . pioglitazone (ACTOS) 45 MG tablet Take 45 mg by mouth daily.    . ramipril (ALTACE) 2.5 MG capsule Take 2.5 mg by mouth at bedtime.     Marland Kitchen ZETIA 10 MG tablet TAKE 1 TABLET ONCE DAILY FOR CHOLESTEROL. 30 tablet 11  . zolpidem (AMBIEN) 10 MG tablet Take 10 mg by mouth at bedtime as needed for sleep.     No current facility-administered medications for this visit.    Allergies: Review of patient's allergies indicates no known allergies.   Social History: Social History   Social History  .  Marital Status: Married    Spouse Name: N/A  . Number of Children: N/A  . Years of Education: N/A   Occupational History  . Not on file.   Social History Main Topics  . Smoking status: Former Smoker -- 1.50 packs/day for 35 years    Types: Cigarettes    Quit date: 11/03/1995  . Smokeless tobacco: Never Used  . Alcohol Use: No  . Drug Use: No  . Sexual Activity: Not on file   Other  Topics Concern  . Not on file   Social History Narrative    Family History: Family History  Problem Relation Age of Onset  . Heart attack Mother   . Stroke Father     Review of Systems: All other systems reviewed and are otherwise negative except as noted above.   Physical Exam: VS: BP 120/48 mmHg  Pulse 76  Ht 6\' 2"  (1.88 m)  Wt 176 lb 6.4 oz (80.015 kg)  BMI 22.64 kg/m2  SpO2 98% , BMI Body mass index is 22.64 kg/(m^2).  GEN- The patient is elderly appearing, alert and oriented x 3 today, hard of hearing  HEENT: normocephalic, atraumatic; sclera clear, conjunctiva pink; hearing intact; oropharynx clear; neck supple  Lungs- Clear to ausculation bilaterally, normal work of breathing. No wheezes, rales, rhonchi Heart- Regular rate and rhythm (paced) GI- soft, non-tender, non-distended, bowel sounds present  Extremities- no clubbing, cyanosis, or edema  MS- no significant deformity or atrophy Skin- warm and dry, no rash or lesion; ICD pocket well healed Psych- euthymic mood, full affect Neuro- strength and sensation are intact  ICD interrogation- reviewed in detail today, See PACEART report  EKG: EKG is ordered today. The ekg ordered today shows AV pacing   Recent Labs: 03/06/2015: Hemoglobin 11.6*; Platelets 215 06/21/2015: ALT 32; BUN 17; Creatinine, Ser 1.47*; Potassium 4.5; Sodium 138; TSH 7.840*   Wt Readings from Last 3 Encounters:  09/13/15 176 lb 6.4 oz (80.015 kg)  08/20/15 172 lb 9.6 oz (78.291 kg)  06/19/15 162 lb 9.6 oz (73.755 kg)     Other studies Reviewed: Additional studies/ records that were reviewed today include: Dr Croitoru's office notes, Dr Kennon Holter office notes, echo   Assessment and Plan:  1. Chronic systolic dysfunction euvolemic today Stable on an appropriate medical regimen EF normalized post CRTD implant. Device at [facility] today. There is no indication to continue with ICD, CRTP gen change recommended.  Risks, benefits discussed with patient today who wishes to proceed. He would prefer that Dr Lovena Le do his generator change as he implanted original device. Will schedule at next available time. Hold Pradaxa for 2 doses prior to procedure  2. 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 HTN Stable No change required today  4. CAD No recent ischemic symptoms Continue current therapy    Current medicines are reviewed at length with the patient today.  The patient does not have concerns regarding his medicines. The following changes were made today: none  Labs/ tests ordered today include: pre-procedure labs  No orders of the defined types were placed in this encounter.    Disposition: Follow up with Dr C following gen change    Signed, [name], NP 09/13/2015 9:25 AM       EP Attending  Patient seen and examined. He is known to me from previous ICD implant. He has a BiV device and has reached ERI. He has had normalization of  his EF by TEE which was performed about 10 months ago when he presented with Staph Aureus (MSSA) bacteremia. He had no vegetations, was treated with IV anti-biotics and has had no more evidence of infection. He had a remote h/o VT but has not had any in over 6 years. He looks much older than his stated age. I have discussed the treatment options with the patient and he wishes to proceed with BiV ICD removal and insertion of a new BiV PPM. I intend to place the new device under the pectoralis major muscle.   Mikle Bosworth.D.

## 2015-09-25 NOTE — Progress Notes (Signed)
Given mupirocin and instruction sheet

## 2015-09-25 NOTE — Discharge Instructions (Signed)
Pacemaker Battery Change, Care After Refer to this sheet in the next few weeks. These instructions provide you with information on caring for yourself after your procedure. Your health care provider may also give you more specific instructions. Your treatment has been planned according to current medical practices, but problems sometimes occur. Call your health care provider if you have any problems or questions after your procedure. WHAT TO EXPECT AFTER THE PROCEDURE After your procedure, it is typical to have the following sensations:  Soreness at the pacemaker site. HOME CARE INSTRUCTIONS   Keep the incision clean and dry.  REMOVE OUTER DRESSING TOMORROW AFTERNOON; DO NOT GET SITE WET FOR 10DAYS  For the first week after the replacement, avoid stretching motions that pull at the incision site, and avoid heavy exercise with the arm that is on the same side as the incision.  Take medicines only as directed by your health care provider.  Keep all follow-up visits as directed by your health care provider. SEEK MEDICAL CARE IF:   You have pain at the incision site that is not relieved by over-the-counter or prescription medicine.  There is drainage or pus from the incision site.  There is swelling larger than a lime at the incision site.  You develop red streaking that extends above or below the incision site.  You feel brief, intermittent palpitations, light-headedness, or any symptoms that you feel might be related to your heart. SEEK IMMEDIATE MEDICAL CARE IF:   You experience chest pain that is different than the pain at the pacemaker site.  You experience shortness of breath.  You have palpitations or irregular heartbeat.  You have light-headedness that does not go away quickly.  You faint.  You have pain that gets worse and is not relieved by medicine.   This information is not intended to replace advice given to you by your health care provider. Make sure you discuss  any questions you have with your health care provider.   Document Released: 12/21/2012 Document Revised: 03/23/2014 Document Reviewed: 12/21/2012 Elsevier Interactive Patient Education [employer].

## 2015-09-26 ENCOUNTER — Encounter (HOSPITAL_COMMUNITY): Payer: Self-pay | Admitting: Internal Medicine

## 2015-10-07 ENCOUNTER — Encounter (INDEPENDENT_AMBULATORY_CARE_PROVIDER_SITE_OTHER): Payer: Self-pay

## 2015-10-07 ENCOUNTER — Encounter: Payer: Self-pay | Admitting: Internal Medicine

## 2015-10-07 ENCOUNTER — Ambulatory Visit (INDEPENDENT_AMBULATORY_CARE_PROVIDER_SITE_OTHER): Payer: Medicare Other | Admitting: *Deleted

## 2015-10-07 DIAGNOSIS — I5022 Chronic systolic (congestive) heart failure: Secondary | ICD-10-CM

## 2015-10-07 DIAGNOSIS — Z950 Presence of cardiac pacemaker: Secondary | ICD-10-CM

## 2015-10-07 LAB — CUP PACEART INCLINIC DEVICE CHECK
Battery Voltage: 3.1 V
Brady Statistic AP VP Percent: 83.07 %
Brady Statistic AS VP Percent: 16.89 %
Brady Statistic AS VS Percent: 0.02 %
Brady Statistic RA Percent Paced: 83.09 %
Implantable Lead Implant Date: 20110224
Implantable Lead Location: 753859
Implantable Lead Model: 5076
Implantable Lead Model: 6947
Lead Channel Impedance Value: 399 Ohm
Lead Channel Impedance Value: 456 Ohm
Lead Channel Impedance Value: 494 Ohm
Lead Channel Impedance Value: 494 Ohm
Lead Channel Impedance Value: 551 Ohm
Lead Channel Impedance Value: 589 Ohm
Lead Channel Pacing Threshold Amplitude: 0.75 V
Lead Channel Pacing Threshold Amplitude: 1 V
Lead Channel Pacing Threshold Pulse Width: 0.4 ms
Lead Channel Pacing Threshold Pulse Width: 0.4 ms
Lead Channel Pacing Threshold Pulse Width: 0.4 ms
Lead Channel Setting Pacing Amplitude: 1.5 V
Lead Channel Setting Pacing Amplitude: 2 V
Lead Channel Setting Pacing Amplitude: 2.5 V
Lead Channel Setting Pacing Pulse Width: 0.4 ms
MDC IDC LEAD IMPLANT DT: 20110224
MDC IDC LEAD IMPLANT DT: 20110224
MDC IDC LEAD LOCATION: 753858
MDC IDC LEAD LOCATION: 753860
MDC IDC LEAD MODEL: 4196
MDC IDC MSMT LEADCHNL LV IMPEDANCE VALUE: 646 Ohm
MDC IDC MSMT LEADCHNL LV IMPEDANCE VALUE: 950 Ohm
MDC IDC MSMT LEADCHNL RA IMPEDANCE VALUE: 475 Ohm
MDC IDC MSMT LEADCHNL RA PACING THRESHOLD AMPLITUDE: 0.75 V
MDC IDC MSMT LEADCHNL RA SENSING INTR AMPL: 1.25 mV
MDC IDC MSMT LEADCHNL RV SENSING INTR AMPL: 9.625 mV
MDC IDC SESS DTM: 20170724141410
MDC IDC SET LEADCHNL LV PACING PULSEWIDTH: 0.4 ms
MDC IDC SET LEADCHNL RV SENSING SENSITIVITY: 0.9 mV
MDC IDC STAT BRADY AP VS PERCENT: 0.02 %
MDC IDC STAT BRADY RV PERCENT PACED: 99.96 %

## 2015-10-07 NOTE — Progress Notes (Signed)
Wound check appointment. Steri-strips removed. Wound without redness. Edema noted over device site, decreased in recent days per patient, soft to palpation. Incision edges approximated, stitch clipped from medial incision, antibiotic ointment and bandaid applied. Wound otherwise well healed. Normal device function. Thresholds, sensing, and impedances consistent with implant measurements. Patient bi-ventricularly pacing 98.2% of the time. Device programmed at chronic outputs--RA min output to 2.0V, RV min output to 2.5V. PVAB method reprogrammed to Partial +. Histogram distribution appropriate for patient and level of activity. No mode switches or high ventricular rates noted. Patient educated about wound care, arm mobility, lifting restrictions. ROV with device clinic on 10/16/15 for wound recheck and ROV with GT on 11/29/15 as scheduled.

## 2015-10-16 ENCOUNTER — Ambulatory Visit: Payer: Medicare Other

## 2015-10-18 ENCOUNTER — Encounter (HOSPITAL_COMMUNITY): Payer: Self-pay | Admitting: Cardiology

## 2015-10-23 ENCOUNTER — Ambulatory Visit (INDEPENDENT_AMBULATORY_CARE_PROVIDER_SITE_OTHER): Payer: Medicare Other | Admitting: *Deleted

## 2015-10-23 DIAGNOSIS — I5022 Chronic systolic (congestive) heart failure: Secondary | ICD-10-CM

## 2015-10-23 DIAGNOSIS — I255 Ischemic cardiomyopathy: Secondary | ICD-10-CM

## 2015-10-23 NOTE — Progress Notes (Signed)
Wound recheck from appt on 7/24. Incision edges approximated. Small scab and redness noted around mid incision where stitch was previously removed. [name], NP assessed wound and recommended that antibiotic ointment and band aid be applied to site. Patient was instructed to keep band aid in place for 2 days (extra band aids given). Patient was also instructed to call if he notices increased redness swelling, heat, or drainage. Patient voiced understanding. Patient to follow up with GT as scheduled.

## 2015-11-20 ENCOUNTER — Other Ambulatory Visit: Payer: Self-pay | Admitting: Cardiovascular Disease

## 2015-11-27 ENCOUNTER — Encounter: Payer: Self-pay | Admitting: Internal Medicine

## 2015-11-29 ENCOUNTER — Encounter: Payer: Medicare Other | Admitting: Internal Medicine

## 2015-12-09 DIAGNOSIS — E782 Mixed hyperlipidemia: Secondary | ICD-10-CM | POA: Diagnosis not present

## 2015-12-09 DIAGNOSIS — E1129 Type 2 diabetes mellitus with other diabetic kidney complication: Secondary | ICD-10-CM | POA: Diagnosis not present

## 2015-12-09 DIAGNOSIS — E785 Hyperlipidemia, unspecified: Secondary | ICD-10-CM | POA: Diagnosis not present

## 2015-12-09 DIAGNOSIS — I10 Essential (primary) hypertension: Secondary | ICD-10-CM | POA: Diagnosis not present

## 2015-12-09 DIAGNOSIS — R809 Proteinuria, unspecified: Secondary | ICD-10-CM | POA: Diagnosis not present

## 2015-12-09 DIAGNOSIS — D649 Anemia, unspecified: Secondary | ICD-10-CM | POA: Diagnosis not present

## 2015-12-09 DIAGNOSIS — E039 Hypothyroidism, unspecified: Secondary | ICD-10-CM | POA: Diagnosis not present

## 2015-12-09 DIAGNOSIS — N400 Enlarged prostate without lower urinary tract symptoms: Secondary | ICD-10-CM | POA: Diagnosis not present

## 2015-12-09 DIAGNOSIS — D808 Other immunodeficiencies with predominantly antibody defects: Secondary | ICD-10-CM | POA: Diagnosis not present

## 2015-12-09 DIAGNOSIS — Z1389 Encounter for screening for other disorder: Secondary | ICD-10-CM | POA: Diagnosis not present

## 2015-12-09 DIAGNOSIS — Z6823 Body mass index (BMI) 23.0-23.9, adult: Secondary | ICD-10-CM | POA: Diagnosis not present

## 2015-12-11 ENCOUNTER — Ambulatory Visit (INDEPENDENT_AMBULATORY_CARE_PROVIDER_SITE_OTHER): Payer: Medicare Other | Admitting: Internal Medicine

## 2015-12-11 ENCOUNTER — Encounter: Payer: Self-pay | Admitting: Internal Medicine

## 2015-12-11 VITALS — BP 128/68 | HR 71 | Ht 73.0 in | Wt 185.6 lb

## 2015-12-11 DIAGNOSIS — Z950 Presence of cardiac pacemaker: Secondary | ICD-10-CM | POA: Diagnosis not present

## 2015-12-11 DIAGNOSIS — I255 Ischemic cardiomyopathy: Secondary | ICD-10-CM

## 2015-12-11 DIAGNOSIS — I472 Ventricular tachycardia, unspecified: Secondary | ICD-10-CM

## 2015-12-11 LAB — CUP PACEART INCLINIC DEVICE CHECK
Battery Remaining Longevity: 105 mo
Battery Voltage: 3.05 V
Brady Statistic AS VS Percent: 0 %
Implantable Lead Implant Date: 20110224
Implantable Lead Implant Date: 20110224
Implantable Lead Implant Date: 20110224
Implantable Lead Location: 753858
Implantable Lead Location: 753860
Implantable Lead Model: 4196
Lead Channel Impedance Value: 418 Ohm
Lead Channel Impedance Value: 437 Ohm
Lead Channel Impedance Value: 456 Ohm
Lead Channel Pacing Threshold Amplitude: 0.5 V
Lead Channel Pacing Threshold Amplitude: 0.875 V
Lead Channel Pacing Threshold Pulse Width: 0.4 ms
Lead Channel Pacing Threshold Pulse Width: 0.4 ms
Lead Channel Setting Pacing Amplitude: 1.5 V
Lead Channel Setting Pacing Amplitude: 2.5 V
Lead Channel Setting Pacing Pulse Width: 0.4 ms
Lead Channel Setting Sensing Sensitivity: 0.9 mV
MDC IDC LEAD LOCATION: 753859
MDC IDC MSMT LEADCHNL LV IMPEDANCE VALUE: 494 Ohm
MDC IDC MSMT LEADCHNL LV IMPEDANCE VALUE: 513 Ohm
MDC IDC MSMT LEADCHNL LV IMPEDANCE VALUE: 570 Ohm
MDC IDC MSMT LEADCHNL LV IMPEDANCE VALUE: 589 Ohm
MDC IDC MSMT LEADCHNL LV IMPEDANCE VALUE: 912 Ohm
MDC IDC MSMT LEADCHNL RA IMPEDANCE VALUE: 323 Ohm
MDC IDC MSMT LEADCHNL RA PACING THRESHOLD AMPLITUDE: 0.75 V
MDC IDC MSMT LEADCHNL RA PACING THRESHOLD PULSEWIDTH: 0.4 ms
MDC IDC MSMT LEADCHNL RA SENSING INTR AMPL: 1 mV
MDC IDC MSMT LEADCHNL RV SENSING INTR AMPL: 10 mV
MDC IDC SESS DTM: 20170927151257
MDC IDC SET LEADCHNL LV PACING PULSEWIDTH: 0.4 ms
MDC IDC SET LEADCHNL RA PACING AMPLITUDE: 2 V
MDC IDC STAT BRADY AP VP PERCENT: 86.48 %
MDC IDC STAT BRADY AP VS PERCENT: 0.02 %
MDC IDC STAT BRADY AS VP PERCENT: 13.49 %
MDC IDC STAT BRADY RA PERCENT PACED: 86.5 %
MDC IDC STAT BRADY RV PERCENT PACED: 99.98 %

## 2015-12-11 NOTE — Progress Notes (Signed)
HPI [name] returns today for followup. He has a h/o CHB, chronic systolic heart failure, s/p BiV ICD, who reached ERI and had a BiV PPM inserted several weeks ago. In the interim he has been stable. He denies chest pain or sob. Minimal edema. No syncope.  No Known Allergies   Current Outpatient Prescriptions  Medication Sig Dispense Refill  . ALPRAZolam (XANAX) 1 MG tablet Take 1 mg by mouth daily as needed for anxiety or sleep.     Marland Kitchen amiodarone (PACERONE) 200 MG tablet Take 200 mg by mouth daily.    Marland Kitchen aspirin EC 81 MG tablet Take 81 mg by mouth at bedtime.    Marland Kitchen atorvastatin (LIPITOR) 40 MG tablet Take 40 mg by mouth daily.    . dabigatran (PRADAXA) 75 MG CAPS capsule TAKE 1 CAPSULE BY MOUTH EVERY 12 HOURS. 48 capsule 0  . levothyroxine (SYNTHROID, LEVOTHROID) 112 MCG tablet Take 112 mcg by mouth daily before breakfast.    . magnesium oxide (MAG-OX) 400 MG tablet Take 400 mg by mouth daily.    . metoprolol (LOPRESSOR) 50 MG tablet TAKE 1 TABLET BY MOUTH TWICE DAILY. 180 tablet 2  . niacin (NIASPAN) 1000 MG CR tablet TAKE (1) TABLET BY MOUTH AT BEDTIME. 30 tablet 11  . nitroGLYCERIN (NITROSTAT) 0.4 MG SL tablet Place 0.4 mg under the tongue every 5 (five) minutes x 3 doses as needed for chest pain.    Marland Kitchen oxyCODONE-acetaminophen (PERCOCET) 10-325 MG per tablet Take 1 tablet by mouth every 4 (four) hours as needed for pain.    . pioglitazone (ACTOS) 45 MG tablet Take 45 mg by mouth daily.    . ramipril (ALTACE) 2.5 MG capsule Take 2.5 mg by mouth at bedtime.     Marland Kitchen ZETIA 10 MG tablet TAKE 1 TABLET ONCE DAILY FOR CHOLESTEROL. 30 tablet 11  . zolpidem (AMBIEN) 10 MG tablet Take 10 mg by mouth at bedtime as needed for sleep.     No current facility-administered medications for this visit.      Past Medical History:  Diagnosis Date  . CAD (coronary artery disease)   . DM (diabetes mellitus) ([place])    type 2   . DVT (deep venous thrombosis) ([place])   . Hyperlipidemia   .  Hypertension   . ICD (implantable cardioverter-defibrillator), single, in situ   . Ischemic cardiomyopathy   . PAF (paroxysmal atrial fibrillation) ([place])   . RBBB   . S/P CABG x 5     ROS:   All systems reviewed and negative except as noted in the HPI.   Past Surgical History:  Procedure Laterality Date  . BACK SURGERY    . CARDIAC CATHETERIZATION  05/08/2009   small vessel disease  . CARDIAC DEFIBRILLATOR PLACEMENT  05/09/2009   Medtronic  . CORONARY ARTERY BYPASS GRAFT  12/31/1995   LIMA to LAD,SVG to intermediate,SVG to CX,seq. svg to posterior descending and posterolateral RCA  . EP IMPLANTABLE DEVICE N/A 09/25/2015   Procedure: PPM/BIV PPM Generator Changeout;  Surgeon: Evans Lance, MD;  Location: [facility];  Service: Cardiovascular;  Laterality: N/A;  . I&D EXTREMITY Left 11/03/2013   Procedure: Left Leg Debride Ulcer, Apply Wound VAC and Theraskin;  Surgeon: Newt Minion, MD;  Location: [facility];  Service: Orthopedics;  Laterality: Left;  . TEE WITHOUT CARDIOVERSION N/A 01/11/2015   Procedure: TRANSESOPHAGEAL ECHOCARDIOGRAM (TEE);  Surgeon: Herminio Commons, MD;  [facility];  Service: Cardiology;  Laterality: N/A;     Family History  Problem Relation Age of Onset  . Heart attack Mother   . Stroke Father      Social History   Social History  . Marital status: Married    Spouse name: N/A  . Number of children: N/A  . Years of education: N/A   Occupational History  . Not on file.   Social History Main Topics  . Smoking status: Former Smoker    Packs/day: 1.50    Years: 35.00    Types: Cigarettes    Quit date: 11/03/1995  . Smokeless tobacco: Never Used  . Alcohol use No  . Drug use: No  . Sexual activity: Not on file   Other Topics Concern  . Not on file   Social History Narrative  . No narrative on file     BP 128/68   Pulse 71   Ht 6\' 1"  (1.854 m)   Wt 185 lb 9.6 oz (84.2 kg)   SpO2 96%   BMI 24.49 kg/m   Physical  Exam:  Well appearing 70 yo man, NAD HEENT: Unremarkable Neck:  6 cm JVD, no thyromegally Lymphatics:  No adenopathy Back:  No CVA tenderness Lungs:  Clear with no wheezes HEART:  Regular rate rhythm, no murmurs, no rubs, no clicks Abd:  soft, positive bowel sounds, no organomegally, no rebound, no guarding Ext:  2 plus pulses, no edema, no cyanosis, no clubbing Skin:  No rashes no nodules Neuro:  CN II through XII intact, motor grossly intact  EKG - AV sequential Biv pacing  DEVICE  Normal device function.  See PaceArt for details.   Assess/Plan:  1. Chronic systolic heart failure - his symptoms are class 2. He will continue his current meds. 2. BiV PPM - his medtronic device is working normally. Will recheck in several months. 3. PAF - he is maintaining NSR on amiodarone therapy.  Mikle Bosworth.D.

## 2015-12-11 NOTE — Patient Instructions (Addendum)
Medication Instructions:  Your physician recommends that you continue on your current medications as directed. Please refer to the Current Medication list given to you today.   Labwork: None Ordered   Testing/Procedures: None Ordered   Follow-Up: Remote monitoring is used to monitor your Pacemaker from home. This monitoring reduces the number of office visits required to check your device to one time per year. It allows [place] to keep an eye on the functioning of your device to ensure it is working properly. You are scheduled for a device check from home on 03/11/16.. You may send your transmission at any time that day. If you have a wireless device, the transmission will be sent automatically. After your physician reviews your transmission, you will receive a postcard with your next transmission date.  Your physician wants you to follow-up in: 9 months with Dr. Lovena Le in [place].  You will receive a reminder letter in the mail two months in advance. If you don't receive a letter, please call our office to schedule the follow-up appointment.  Your physician recommends that you schedule a follow-up appointment in: December 2017 with Dr. Sallyanne Kuster   If you need a refill on your cardiac medications before your next appointment, please call your pharmacy.   Thank you for choosing [facility]! Christen Bame, RN [phone]

## 2015-12-31 ENCOUNTER — Telehealth: Payer: Self-pay | Admitting: Cardiovascular Disease

## 2015-12-31 NOTE — Telephone Encounter (Signed)
Patient's wife here today for her office visit with Dr Gwenlyn Found. She asked for samples for her husband. Medication Samples have been provided to the patient's wife.  Drug name: Pradaxa       Strength: 75 mg        Qty: 4 boxes (24 tablets)  LOT: [id number]  Exp.Date: 04/2017  Dosing instructions: 1 tablet by mouth every 12 hours.   Roney Jaffe 2:00 PM 12/31/2015

## 2015-12-31 NOTE — Telephone Encounter (Signed)
Mrs. Boldon came in the office asking if we have samples of Eloquist that we can give Mr. Keown. She said he was given Pradaxa, 150mg  but he only takes the 75mg  and the 150 is too strong for him. Please let her know if we have any Eloquist samples available for him.   Pt's ph# [phone]  Thank you.

## 2016-01-31 ENCOUNTER — Other Ambulatory Visit: Payer: Self-pay | Admitting: Cardiovascular Disease

## 2016-03-06 DIAGNOSIS — Z6823 Body mass index (BMI) 23.0-23.9, adult: Secondary | ICD-10-CM | POA: Diagnosis not present

## 2016-03-06 DIAGNOSIS — Z1389 Encounter for screening for other disorder: Secondary | ICD-10-CM | POA: Diagnosis not present

## 2016-03-06 DIAGNOSIS — E039 Hypothyroidism, unspecified: Secondary | ICD-10-CM | POA: Diagnosis not present

## 2016-03-06 DIAGNOSIS — G894 Chronic pain syndrome: Secondary | ICD-10-CM | POA: Diagnosis not present

## 2016-03-06 DIAGNOSIS — M1991 Primary osteoarthritis, unspecified site: Secondary | ICD-10-CM | POA: Diagnosis not present

## 2016-03-06 DIAGNOSIS — E063 Autoimmune thyroiditis: Secondary | ICD-10-CM | POA: Diagnosis not present

## 2016-03-06 DIAGNOSIS — M47816 Spondylosis without myelopathy or radiculopathy, lumbar region: Secondary | ICD-10-CM | POA: Diagnosis not present

## 2016-03-11 ENCOUNTER — Telehealth: Payer: Self-pay | Admitting: Cardiology

## 2016-03-11 ENCOUNTER — Ambulatory Visit (INDEPENDENT_AMBULATORY_CARE_PROVIDER_SITE_OTHER): Payer: Medicare Other | Admitting: *Deleted

## 2016-03-11 DIAGNOSIS — I472 Ventricular tachycardia, unspecified: Secondary | ICD-10-CM

## 2016-03-11 NOTE — Telephone Encounter (Signed)
Spoke with pt and reminded pt of remote transmission that is due today. Pt verbalized understanding.   

## 2016-03-12 NOTE — Progress Notes (Signed)
Remote pacemaker transmission.   

## 2016-03-13 ENCOUNTER — Encounter: Payer: Self-pay | Admitting: Cardiology

## 2016-03-13 LAB — CUP PACEART REMOTE DEVICE CHECK
Brady Statistic AP VP Percent: 87.14 %
Brady Statistic AP VS Percent: 0 %
Brady Statistic RV Percent Paced: 99.8 %
Date Time Interrogation Session: 20171228022256
Implantable Lead Implant Date: 20110224
Implantable Lead Location: 753859
Lead Channel Impedance Value: 475 Ohm
Lead Channel Impedance Value: 513 Ohm
Lead Channel Impedance Value: 532 Ohm
Lead Channel Impedance Value: 570 Ohm
Lead Channel Impedance Value: 608 Ohm
Lead Channel Pacing Threshold Amplitude: 0.75 V
Lead Channel Pacing Threshold Pulse Width: 0.4 ms
Lead Channel Sensing Intrinsic Amplitude: 0.375 mV
Lead Channel Sensing Intrinsic Amplitude: 10.75 mV
Lead Channel Setting Pacing Amplitude: 2.5 V
Lead Channel Setting Sensing Sensitivity: 0.9 mV
MDC IDC LEAD IMPLANT DT: 20110224
MDC IDC LEAD IMPLANT DT: 20110224
MDC IDC LEAD LOCATION: 753858
MDC IDC LEAD LOCATION: 753860
MDC IDC LEAD MODEL: 4196
MDC IDC MSMT BATTERY REMAINING LONGEVITY: 89 mo
MDC IDC MSMT BATTERY VOLTAGE: 3.03 V
MDC IDC MSMT LEADCHNL LV IMPEDANCE VALUE: 912 Ohm
MDC IDC MSMT LEADCHNL LV PACING THRESHOLD AMPLITUDE: 0.75 V
MDC IDC MSMT LEADCHNL LV PACING THRESHOLD PULSEWIDTH: 0.4 ms
MDC IDC MSMT LEADCHNL RA IMPEDANCE VALUE: 361 Ohm
MDC IDC MSMT LEADCHNL RA IMPEDANCE VALUE: 456 Ohm
MDC IDC MSMT LEADCHNL RA SENSING INTR AMPL: 0.375 mV
MDC IDC MSMT LEADCHNL RV IMPEDANCE VALUE: 437 Ohm
MDC IDC MSMT LEADCHNL RV PACING THRESHOLD AMPLITUDE: 0.5 V
MDC IDC MSMT LEADCHNL RV PACING THRESHOLD PULSEWIDTH: 0.4 ms
MDC IDC MSMT LEADCHNL RV SENSING INTR AMPL: 10.75 mV
MDC IDC PG IMPLANT DT: 20170712
MDC IDC SET LEADCHNL LV PACING AMPLITUDE: 1.25 V
MDC IDC SET LEADCHNL LV PACING PULSEWIDTH: 0.4 ms
MDC IDC SET LEADCHNL RA PACING AMPLITUDE: 2 V
MDC IDC SET LEADCHNL RV PACING PULSEWIDTH: 0.4 ms
MDC IDC STAT BRADY AS VP PERCENT: 12.86 %
MDC IDC STAT BRADY AS VS PERCENT: 0 %
MDC IDC STAT BRADY RA PERCENT PACED: 87.1 %

## 2016-04-22 DIAGNOSIS — E1129 Type 2 diabetes mellitus with other diabetic kidney complication: Secondary | ICD-10-CM | POA: Diagnosis not present

## 2016-04-22 DIAGNOSIS — H53032 Strabismic amblyopia, left eye: Secondary | ICD-10-CM | POA: Diagnosis not present

## 2016-04-22 DIAGNOSIS — H2513 Age-related nuclear cataract, bilateral: Secondary | ICD-10-CM | POA: Diagnosis not present

## 2016-04-22 DIAGNOSIS — H524 Presbyopia: Secondary | ICD-10-CM | POA: Diagnosis not present

## 2016-04-22 DIAGNOSIS — H52203 Unspecified astigmatism, bilateral: Secondary | ICD-10-CM | POA: Diagnosis not present

## 2016-04-22 DIAGNOSIS — H5203 Hypermetropia, bilateral: Secondary | ICD-10-CM | POA: Diagnosis not present

## 2016-05-27 DIAGNOSIS — Z6823 Body mass index (BMI) 23.0-23.9, adult: Secondary | ICD-10-CM | POA: Diagnosis not present

## 2016-05-27 DIAGNOSIS — G894 Chronic pain syndrome: Secondary | ICD-10-CM | POA: Diagnosis not present

## 2016-05-27 DIAGNOSIS — E063 Autoimmune thyroiditis: Secondary | ICD-10-CM | POA: Diagnosis not present

## 2016-06-02 ENCOUNTER — Other Ambulatory Visit: Payer: Self-pay | Admitting: Cardiovascular Disease

## 2016-06-10 ENCOUNTER — Ambulatory Visit (INDEPENDENT_AMBULATORY_CARE_PROVIDER_SITE_OTHER): Payer: Medicare Other | Admitting: *Deleted

## 2016-06-10 DIAGNOSIS — I472 Ventricular tachycardia, unspecified: Secondary | ICD-10-CM

## 2016-06-10 NOTE — Progress Notes (Signed)
Remote pacemaker transmission.   

## 2016-06-11 LAB — CUP PACEART REMOTE DEVICE CHECK
Brady Statistic AP VP Percent: 69.56 %
Brady Statistic AP VS Percent: 0.04 %
Brady Statistic AS VP Percent: 30.38 %
Brady Statistic AS VS Percent: 0.02 %
Date Time Interrogation Session: 20180328190321
Implantable Lead Implant Date: 20110224
Implantable Lead Implant Date: 20110224
Implantable Lead Location: 753858
Implantable Lead Location: 753860
Implantable Lead Model: 4196
Implantable Lead Model: 5076
Lead Channel Impedance Value: 304 Ohm
Lead Channel Impedance Value: 418 Ohm
Lead Channel Impedance Value: 418 Ohm
Lead Channel Impedance Value: 456 Ohm
Lead Channel Impedance Value: 494 Ohm
Lead Channel Impedance Value: 570 Ohm
Lead Channel Impedance Value: 608 Ohm
Lead Channel Pacing Threshold Amplitude: 0.75 V
Lead Channel Pacing Threshold Amplitude: 0.75 V
Lead Channel Pacing Threshold Pulse Width: 0.4 ms
Lead Channel Pacing Threshold Pulse Width: 0.4 ms
Lead Channel Sensing Intrinsic Amplitude: 1.25 mV
Lead Channel Sensing Intrinsic Amplitude: 1.25 mV
Lead Channel Sensing Intrinsic Amplitude: 10.75 mV
Lead Channel Setting Pacing Amplitude: 2 V
Lead Channel Setting Sensing Sensitivity: 0.9 mV
MDC IDC LEAD IMPLANT DT: 20110224
MDC IDC LEAD LOCATION: 753859
MDC IDC MSMT BATTERY REMAINING LONGEVITY: 86 mo
MDC IDC MSMT BATTERY VOLTAGE: 3.03 V
MDC IDC MSMT LEADCHNL LV IMPEDANCE VALUE: 532 Ohm
MDC IDC MSMT LEADCHNL LV IMPEDANCE VALUE: 912 Ohm
MDC IDC MSMT LEADCHNL RV PACING THRESHOLD AMPLITUDE: 0.625 V
MDC IDC MSMT LEADCHNL RV PACING THRESHOLD PULSEWIDTH: 0.4 ms
MDC IDC MSMT LEADCHNL RV SENSING INTR AMPL: 10.75 mV
MDC IDC PG IMPLANT DT: 20170712
MDC IDC SET LEADCHNL LV PACING AMPLITUDE: 1.25 V
MDC IDC SET LEADCHNL LV PACING PULSEWIDTH: 0.4 ms
MDC IDC SET LEADCHNL RV PACING AMPLITUDE: 2.5 V
MDC IDC SET LEADCHNL RV PACING PULSEWIDTH: 0.4 ms
MDC IDC STAT BRADY RA PERCENT PACED: 69.51 %
MDC IDC STAT BRADY RV PERCENT PACED: 99.77 %

## 2016-06-12 ENCOUNTER — Encounter: Payer: Self-pay | Admitting: Cardiology

## 2016-07-20 ENCOUNTER — Ambulatory Visit (INDEPENDENT_AMBULATORY_CARE_PROVIDER_SITE_OTHER): Payer: Medicare Other | Admitting: Internal Medicine

## 2016-07-20 ENCOUNTER — Encounter: Payer: Self-pay | Admitting: Internal Medicine

## 2016-07-20 VITALS — BP 112/60 | HR 74 | Ht 74.0 in | Wt 186.0 lb

## 2016-07-20 DIAGNOSIS — Z950 Presence of cardiac pacemaker: Secondary | ICD-10-CM

## 2016-07-20 DIAGNOSIS — I5022 Chronic systolic (congestive) heart failure: Secondary | ICD-10-CM | POA: Diagnosis not present

## 2016-07-20 NOTE — Progress Notes (Signed)
HPI Mr. Prom returns today for followup. He has a h/o CHB, chronic systolic heart failure, s/p BiV ICD, who had a BiV PPM inserted several months ago. In the interim he has been stable. He denies chest pain or sob. Minimal edema. No syncope.  No Known Allergies   Current Outpatient Prescriptions  Medication Sig Dispense Refill  . ALPRAZolam (XANAX) 1 MG tablet Take 1 mg by mouth daily as needed for anxiety or sleep.     Marland Kitchen amiodarone (PACERONE) 200 MG tablet Take 200 mg by mouth daily.    Marland Kitchen aspirin EC 81 MG tablet Take 81 mg by mouth at bedtime.    Marland Kitchen atorvastatin (LIPITOR) 40 MG tablet Take 40 mg by mouth daily.    Marland Kitchen levothyroxine (SYNTHROID, LEVOTHROID) 112 MCG tablet Take 112 mcg by mouth daily before breakfast.    . magnesium oxide (MAG-OX) 400 MG tablet Take 400 mg by mouth daily.    . metoprolol (LOPRESSOR) 50 MG tablet TAKE 1 TABLET BY MOUTH TWICE DAILY. 180 tablet 2  . niacin (NIASPAN) 1000 MG CR tablet TAKE (1) TABLET BY MOUTH AT BEDTIME. 30 tablet 11  . nitroGLYCERIN (NITROSTAT) 0.4 MG SL tablet Place 0.4 mg under the tongue every 5 (five) minutes x 3 doses as needed for chest pain.    Marland Kitchen oxyCODONE-acetaminophen (PERCOCET) 10-325 MG per tablet Take 1 tablet by mouth every 4 (four) hours as needed for pain.    . pioglitazone (ACTOS) 45 MG tablet Take 45 mg by mouth daily.    Marland Kitchen PRADAXA 75 MG CAPS capsule TAKE 1 CAPSULE BY MOUTH EVERY 12 HOURS. 60 capsule 5  . ramipril (ALTACE) 2.5 MG capsule Take 2.5 mg by mouth at bedtime.     Marland Kitchen ZETIA 10 MG tablet TAKE 1 TABLET ONCE DAILY FOR CHOLESTEROL. 30 tablet 11  . zolpidem (AMBIEN) 10 MG tablet Take 10 mg by mouth at bedtime as needed for sleep.     No current facility-administered medications for this visit.      Past Medical History:  Diagnosis Date  . CAD (coronary artery disease)   . DM (diabetes mellitus) ([place])    type 2   . DVT (deep venous thrombosis) ([place])   . Hyperlipidemia   . Hypertension   . ICD (implantable  cardioverter-defibrillator), single, in situ   . Ischemic cardiomyopathy   . PAF (paroxysmal atrial fibrillation) ([place])   . RBBB   . S/P CABG x 5     ROS:   All systems reviewed and negative except as noted in the HPI.   Past Surgical History:  Procedure Laterality Date  . BACK SURGERY    . CARDIAC CATHETERIZATION  05/08/2009   small vessel disease  . CARDIAC DEFIBRILLATOR PLACEMENT  05/09/2009   [employer]tronic  . CORONARY ARTERY BYPASS GRAFT  12/31/1995   LIMA to LAD,SVG to intermediate,SVG to CX,seq. svg to posterior descending and posterolateral RCA  . EP IMPLANTABLE DEVICE N/A 09/25/2015   Procedure: PPM/BIV PPM Generator Changeout;  Surgeon: Evans Lance, MD;  Location: [facility];  Service: Cardiovascular;  Laterality: N/A;  . I&D EXTREMITY Left 11/03/2013   Procedure: Left Leg Debride Ulcer, Apply Wound VAC and Theraskin;  Surgeon: Newt Minion, MD;  Location: [facility];  Service: Orthopedics;  Laterality: Left;  . TEE WITHOUT CARDIOVERSION N/A 01/11/2015   Procedure: TRANSESOPHAGEAL ECHOCARDIOGRAM (TEE);  Surgeon: Herminio Commons, MD;  [facility];  Service: Cardiology;  Laterality: N/A;  Family History  Problem Relation Age of Onset  . Heart attack Mother   . Stroke Father      Social History   Social History  . Marital status: Married    Spouse name: N/A  . Number of children: N/A  . Years of education: N/A   Occupational History  . Not on file.   Social History Main Topics  . Smoking status: Former Smoker    Packs/day: 1.50    Years: 35.00    Types: Cigarettes    Quit date: 11/03/1995  . Smokeless tobacco: Never Used  . Alcohol use No  . Drug use: No  . Sexual activity: Not on file   Other Topics Concern  . Not on file   Social History Narrative  . No narrative on file     BP 112/60   Pulse 74   Ht 6\' 2"  (1.88 m)   Wt 186 lb (84.4 kg)   SpO2 98%   BMI 23.88 kg/m   Physical Exam:  Well appearing 71 yo man,  NAD HEENT: Unremarkable Neck:  6 cm JVD, no thyromegally Lymphatics:  No adenopathy Back:  No CVA tenderness Lungs:  Clear with no wheezes, well healed PPM incision HEART:  Regular rate rhythm, no murmurs, no rubs, no clicks Abd:  soft, positive bowel sounds, no organomegally, no rebound, no guarding Ext:  2 plus pulses, no edema, no cyanosis, no clubbing Skin:  No rashes no nodules Neuro:  CN II through XII intact, motor grossly intact  EKG - AV sequential Biv pacing  DEVICE  Normal device function.  See PaceArt for details.   Assess/Plan:  1. Chronic systolic heart failure - his symptoms are class 2. He will continue his current meds. 2. BiV PPM - his medtronic device is working normally. Will recheck in several months. 3. PAF - he is maintaining NSR on amiodarone therapy.  Alan Orr.D.

## 2016-07-20 NOTE — Patient Instructions (Signed)

## 2016-07-24 LAB — CUP PACEART INCLINIC DEVICE CHECK
Battery Remaining Longevity: 86 mo
Brady Statistic AS VS Percent: 0.01 %
Brady Statistic RA Percent Paced: 72.23 %
Brady Statistic RV Percent Paced: 99.79 %
Implantable Lead Implant Date: 20110224
Implantable Lead Implant Date: 20110224
Implantable Lead Location: 753858
Implantable Lead Model: 5076
Implantable Lead Model: 6947
Implantable Pulse Generator Implant Date: 20170712
Lead Channel Impedance Value: 361 Ohm
Lead Channel Impedance Value: 475 Ohm
Lead Channel Impedance Value: 513 Ohm
Lead Channel Impedance Value: 874 Ohm
Lead Channel Pacing Threshold Amplitude: 0.625 V
Lead Channel Sensing Intrinsic Amplitude: 10.625 mV
Lead Channel Setting Pacing Amplitude: 1.25 V
Lead Channel Setting Pacing Amplitude: 2 V
Lead Channel Setting Pacing Pulse Width: 0.4 ms
MDC IDC LEAD IMPLANT DT: 20110224
MDC IDC LEAD LOCATION: 753859
MDC IDC LEAD LOCATION: 753860
MDC IDC MSMT BATTERY VOLTAGE: 3.03 V
MDC IDC MSMT LEADCHNL LV IMPEDANCE VALUE: 475 Ohm
MDC IDC MSMT LEADCHNL LV IMPEDANCE VALUE: 570 Ohm
MDC IDC MSMT LEADCHNL LV IMPEDANCE VALUE: 589 Ohm
MDC IDC MSMT LEADCHNL LV PACING THRESHOLD AMPLITUDE: 0.75 V
MDC IDC MSMT LEADCHNL LV PACING THRESHOLD PULSEWIDTH: 0.4 ms
MDC IDC MSMT LEADCHNL RA PACING THRESHOLD AMPLITUDE: 0.75 V
MDC IDC MSMT LEADCHNL RA PACING THRESHOLD PULSEWIDTH: 0.4 ms
MDC IDC MSMT LEADCHNL RA SENSING INTR AMPL: 1 mV
MDC IDC MSMT LEADCHNL RV IMPEDANCE VALUE: 437 Ohm
MDC IDC MSMT LEADCHNL RV IMPEDANCE VALUE: 475 Ohm
MDC IDC MSMT LEADCHNL RV PACING THRESHOLD PULSEWIDTH: 0.4 ms
MDC IDC SESS DTM: 20180508004321
MDC IDC SET LEADCHNL RV PACING AMPLITUDE: 2.5 V
MDC IDC SET LEADCHNL RV PACING PULSEWIDTH: 0.4 ms
MDC IDC SET LEADCHNL RV SENSING SENSITIVITY: 0.9 mV
MDC IDC STAT BRADY AP VP PERCENT: 72.29 %
MDC IDC STAT BRADY AP VS PERCENT: 0.04 %
MDC IDC STAT BRADY AS VP PERCENT: 27.66 %

## 2016-08-22 ENCOUNTER — Inpatient Hospital Stay (HOSPITAL_COMMUNITY)
Admission: EM | Admit: 2016-08-22 | Discharge: 2016-08-24 | DRG: 872 | Disposition: A | Discharge status: Home / Self Care | Payer: Medicare Other | Attending: Family Medicine | Admitting: Family Medicine

## 2016-08-22 ENCOUNTER — Encounter (HOSPITAL_COMMUNITY): Payer: Self-pay | Admitting: *Deleted

## 2016-08-22 ENCOUNTER — Emergency Department (HOSPITAL_COMMUNITY): Payer: Medicare Other

## 2016-08-22 DIAGNOSIS — R32 Unspecified urinary incontinence: Secondary | ICD-10-CM | POA: Diagnosis present

## 2016-08-22 DIAGNOSIS — E1122 Type 2 diabetes mellitus with diabetic chronic kidney disease: Secondary | ICD-10-CM | POA: Diagnosis not present

## 2016-08-22 DIAGNOSIS — Y92002 Bathroom of unspecified non-institutional (private) residence single-family (private) house as the place of occurrence of the external cause: Secondary | ICD-10-CM

## 2016-08-22 DIAGNOSIS — R945 Abnormal results of liver function studies: Secondary | ICD-10-CM | POA: Diagnosis not present

## 2016-08-22 DIAGNOSIS — I959 Hypotension, unspecified: Secondary | ICD-10-CM | POA: Diagnosis not present

## 2016-08-22 DIAGNOSIS — D696 Thrombocytopenia, unspecified: Secondary | ICD-10-CM | POA: Diagnosis not present

## 2016-08-22 DIAGNOSIS — N189 Chronic kidney disease, unspecified: Secondary | ICD-10-CM

## 2016-08-22 DIAGNOSIS — N183 Chronic kidney disease, stage 3 (moderate): Secondary | ICD-10-CM | POA: Diagnosis not present

## 2016-08-22 DIAGNOSIS — I2510 Atherosclerotic heart disease of native coronary artery without angina pectoris: Secondary | ICD-10-CM

## 2016-08-22 DIAGNOSIS — R7989 Other specified abnormal findings of blood chemistry: Secondary | ICD-10-CM | POA: Diagnosis not present

## 2016-08-22 DIAGNOSIS — I5022 Chronic systolic (congestive) heart failure: Secondary | ICD-10-CM

## 2016-08-22 DIAGNOSIS — E860 Dehydration: Secondary | ICD-10-CM | POA: Diagnosis present

## 2016-08-22 DIAGNOSIS — R748 Abnormal levels of other serum enzymes: Secondary | ICD-10-CM

## 2016-08-22 DIAGNOSIS — R159 Full incontinence of feces: Secondary | ICD-10-CM | POA: Diagnosis present

## 2016-08-22 DIAGNOSIS — I255 Ischemic cardiomyopathy: Secondary | ICD-10-CM | POA: Diagnosis not present

## 2016-08-22 DIAGNOSIS — Z95810 Presence of automatic (implantable) cardiac defibrillator: Secondary | ICD-10-CM

## 2016-08-22 DIAGNOSIS — N179 Acute kidney failure, unspecified: Secondary | ICD-10-CM

## 2016-08-22 DIAGNOSIS — I4510 Unspecified right bundle-branch block: Secondary | ICD-10-CM | POA: Diagnosis present

## 2016-08-22 DIAGNOSIS — A7740 Ehrlichiosis, unspecified: Secondary | ICD-10-CM | POA: Diagnosis present

## 2016-08-22 DIAGNOSIS — E1151 Type 2 diabetes mellitus with diabetic peripheral angiopathy without gangrene: Secondary | ICD-10-CM | POA: Diagnosis not present

## 2016-08-22 DIAGNOSIS — Z86718 Personal history of other venous thrombosis and embolism: Secondary | ICD-10-CM

## 2016-08-22 DIAGNOSIS — Z7901 Long term (current) use of anticoagulants: Secondary | ICD-10-CM

## 2016-08-22 DIAGNOSIS — Z7982 Long term (current) use of aspirin: Secondary | ICD-10-CM

## 2016-08-22 DIAGNOSIS — I130 Hypertensive heart and chronic kidney disease with heart failure and stage 1 through stage 4 chronic kidney disease, or unspecified chronic kidney disease: Secondary | ICD-10-CM | POA: Diagnosis not present

## 2016-08-22 DIAGNOSIS — W010XXA Fall on same level from slipping, tripping and stumbling without subsequent striking against object, initial encounter: Secondary | ICD-10-CM | POA: Diagnosis present

## 2016-08-22 DIAGNOSIS — S70361A Insect bite (nonvenomous), right thigh, initial encounter: Secondary | ICD-10-CM | POA: Diagnosis not present

## 2016-08-22 DIAGNOSIS — Z7984 Long term (current) use of oral hypoglycemic drugs: Secondary | ICD-10-CM

## 2016-08-22 DIAGNOSIS — E785 Hyperlipidemia, unspecified: Secondary | ICD-10-CM | POA: Diagnosis not present

## 2016-08-22 DIAGNOSIS — I129 Hypertensive chronic kidney disease with stage 1 through stage 4 chronic kidney disease, or unspecified chronic kidney disease: Secondary | ICD-10-CM | POA: Diagnosis not present

## 2016-08-22 DIAGNOSIS — E872 Acidosis, unspecified: Secondary | ICD-10-CM | POA: Diagnosis present

## 2016-08-22 DIAGNOSIS — E871 Hypo-osmolality and hyponatremia: Secondary | ICD-10-CM | POA: Diagnosis not present

## 2016-08-22 DIAGNOSIS — R404 Transient alteration of awareness: Secondary | ICD-10-CM | POA: Diagnosis not present

## 2016-08-22 DIAGNOSIS — R6521 Severe sepsis with septic shock: Secondary | ICD-10-CM | POA: Diagnosis present

## 2016-08-22 DIAGNOSIS — R531 Weakness: Secondary | ICD-10-CM | POA: Diagnosis not present

## 2016-08-22 DIAGNOSIS — N19 Unspecified kidney failure: Secondary | ICD-10-CM

## 2016-08-22 DIAGNOSIS — N289 Disorder of kidney and ureter, unspecified: Secondary | ICD-10-CM | POA: Diagnosis not present

## 2016-08-22 DIAGNOSIS — I480 Paroxysmal atrial fibrillation: Secondary | ICD-10-CM | POA: Diagnosis present

## 2016-08-22 DIAGNOSIS — I2583 Coronary atherosclerosis due to lipid rich plaque: Secondary | ICD-10-CM

## 2016-08-22 DIAGNOSIS — W57XXXA Bitten or stung by nonvenomous insect and other nonvenomous arthropods, initial encounter: Secondary | ICD-10-CM | POA: Diagnosis not present

## 2016-08-22 DIAGNOSIS — Z87891 Personal history of nicotine dependence: Secondary | ICD-10-CM

## 2016-08-22 DIAGNOSIS — E11649 Type 2 diabetes mellitus with hypoglycemia without coma: Secondary | ICD-10-CM | POA: Diagnosis not present

## 2016-08-22 DIAGNOSIS — Z951 Presence of aortocoronary bypass graft: Secondary | ICD-10-CM

## 2016-08-22 DIAGNOSIS — A419 Sepsis, unspecified organism: Secondary | ICD-10-CM | POA: Diagnosis not present

## 2016-08-22 LAB — CBC WITH DIFFERENTIAL/PLATELET
Basophils Absolute: 0.1 10*3/uL (ref 0.0–0.1)
Basophils Relative: 1 %
EOS PCT: 0 %
Eosinophils Absolute: 0 10*3/uL (ref 0.0–0.7)
HEMATOCRIT: 37.4 % — AB (ref 39.0–52.0)
HEMOGLOBIN: 12.7 g/dL — AB (ref 13.0–17.0)
LYMPHS ABS: 2.9 10*3/uL (ref 0.7–4.0)
LYMPHS PCT: 40 %
MCH: 33 pg (ref 26.0–34.0)
MCHC: 34 g/dL (ref 30.0–36.0)
MCV: 97.1 fL (ref 78.0–100.0)
Monocytes Absolute: 0.5 10*3/uL (ref 0.1–1.0)
Monocytes Relative: 7 %
NEUTROS ABS: 3.8 10*3/uL (ref 1.7–7.7)
NEUTROS PCT: 52 %
Platelets: 74 10*3/uL — ABNORMAL LOW (ref 150–400)
RBC: 3.85 MIL/uL — AB (ref 4.22–5.81)
RDW: 15.9 % — ABNORMAL HIGH (ref 11.5–15.5)
WBC: 7.2 10*3/uL (ref 4.0–10.5)

## 2016-08-22 LAB — COMPREHENSIVE METABOLIC PANEL
ALT: 181 U/L — ABNORMAL HIGH (ref 17–63)
ANION GAP: 12 (ref 5–15)
AST: 211 U/L — AB (ref 15–41)
Albumin: 2 g/dL — ABNORMAL LOW (ref 3.5–5.0)
Alkaline Phosphatase: 126 U/L (ref 38–126)
BUN: 31 mg/dL — AB (ref 6–20)
CHLORIDE: 97 mmol/L — AB (ref 101–111)
CO2: 23 mmol/L (ref 22–32)
Calcium: 7.5 mg/dL — ABNORMAL LOW (ref 8.9–10.3)
Creatinine, Ser: 2.25 mg/dL — ABNORMAL HIGH (ref 0.61–1.24)
GFR calc Af Amer: 32 mL/min — ABNORMAL LOW (ref >60)
GFR calc non Af Amer: 28 mL/min — ABNORMAL LOW (ref >60)
Glucose, Bld: 136 mg/dL — ABNORMAL HIGH (ref 65–99)
POTASSIUM: 3.6 mmol/L (ref 3.5–5.1)
Sodium: 132 mmol/L — ABNORMAL LOW (ref 135–145)
Total Bilirubin: 1.1 mg/dL (ref 0.3–1.2)
Total Protein: 5.1 g/dL — ABNORMAL LOW (ref 6.5–8.1)

## 2016-08-22 LAB — GLUCOSE, CAPILLARY: GLUCOSE-CAPILLARY: 96 mg/dL (ref 65–99)

## 2016-08-22 LAB — LACTIC ACID, PLASMA: Lactic Acid, Venous: 1.9 mmol/L (ref 0.5–1.9)

## 2016-08-22 LAB — I-STAT CG4 LACTIC ACID, ED: LACTIC ACID, VENOUS: 4.82 mmol/L — AB (ref 0.5–1.9)

## 2016-08-22 MED ORDER — ACETAMINOPHEN 650 MG RE SUPP: 650.0000 mg | Freq: Four times a day (QID) | RECTAL | Status: DC | PRN

## 2016-08-22 MED ORDER — ALPRAZOLAM 1 MG PO TABS
1.0000 mg | ORAL_TABLET | Freq: Every day | ORAL | Status: DC | PRN
  Administered 2016-08-22: 1 mg via ORAL
  Filled 2016-08-22: qty 2

## 2016-08-22 MED ORDER — ZOLPIDEM TARTRATE 5 MG PO TABS: 5.0000 mg | ORAL_TABLET | Freq: Every evening | ORAL | Status: DC | PRN

## 2016-08-22 MED ORDER — DOXYCYCLINE HYCLATE 100 MG IV SOLR
INTRAVENOUS | Status: AC
  Filled 2016-08-22: qty 100

## 2016-08-22 MED ORDER — SODIUM CHLORIDE 0.9 % IV BOLUS (SEPSIS)
500.0000 mL | Freq: Once | INTRAVENOUS | Status: AC
  Administered 2016-08-22: 500 mL via INTRAVENOUS

## 2016-08-22 MED ORDER — LEVOTHYROXINE SODIUM 25 MCG PO TABS
137.0000 ug | ORAL_TABLET | Freq: Every day | ORAL | Status: DC
  Administered 2016-08-23: 137 ug via ORAL
  Filled 2016-08-22: qty 1

## 2016-08-22 MED ORDER — OXYCODONE HCL 5 MG PO TABS: 5.0000 mg | ORAL_TABLET | ORAL | Status: DC | PRN

## 2016-08-22 MED ORDER — SODIUM CHLORIDE 0.9 % IV SOLN
1500.0000 mg | Freq: Once | INTRAVENOUS | Status: AC
  Administered 2016-08-22: 1500 mg via INTRAVENOUS
  Filled 2016-08-22: qty 1500

## 2016-08-22 MED ORDER — VANCOMYCIN HCL 10 G IV SOLR
1250.0000 mg | INTRAVENOUS | Status: DC
  Filled 2016-08-22: qty 1250

## 2016-08-22 MED ORDER — SODIUM CHLORIDE 0.9 % IV SOLN
INTRAVENOUS | Status: AC
  Filled 2016-08-22: qty 1500

## 2016-08-22 MED ORDER — INSULIN GLARGINE 100 UNIT/ML ~~LOC~~ SOLN
10.0000 [IU] | Freq: Every day | SUBCUTANEOUS | Status: DC
  Administered 2016-08-22: 10 [IU] via SUBCUTANEOUS
  Filled 2016-08-22 (×2): qty 0.1

## 2016-08-22 MED ORDER — SODIUM CHLORIDE 0.9 % IV SOLN
1.0000 g | Freq: Once | INTRAVENOUS | Status: AC
  Administered 2016-08-22: 1 g via INTRAVENOUS
  Filled 2016-08-22: qty 10

## 2016-08-22 MED ORDER — ONDANSETRON HCL 4 MG PO TABS: 4.0000 mg | ORAL_TABLET | Freq: Four times a day (QID) | ORAL | Status: DC | PRN

## 2016-08-22 MED ORDER — ENSURE ENLIVE PO LIQD
237.0000 mL | Freq: Two times a day (BID) | ORAL | Status: DC
  Administered 2016-08-23: 237 mL via ORAL

## 2016-08-22 MED ORDER — POTASSIUM CHLORIDE IN NACL 40-0.9 MEQ/L-% IV SOLN
INTRAVENOUS | Status: DC
  Administered 2016-08-22 – 2016-08-24 (×4): 100 mL/h via INTRAVENOUS

## 2016-08-22 MED ORDER — EZETIMIBE 10 MG PO TABS
10.0000 mg | ORAL_TABLET | Freq: Every day | ORAL | Status: DC
Start: 2016-08-23 — End: 2016-08-24
  Administered 2016-08-23 – 2016-08-24 (×2): 10 mg via ORAL
  Filled 2016-08-22 (×2): qty 1

## 2016-08-22 MED ORDER — PIPERACILLIN-TAZOBACTAM 3.375 G IVPB 30 MIN
3.3750 g | Freq: Once | INTRAVENOUS | Status: AC
  Administered 2016-08-22: 3.375 g via INTRAVENOUS
  Filled 2016-08-22: qty 50

## 2016-08-22 MED ORDER — ONDANSETRON HCL 4 MG/2ML IJ SOLN: 4.0000 mg | Freq: Four times a day (QID) | INTRAMUSCULAR | Status: DC | PRN

## 2016-08-22 MED ORDER — INSULIN ASPART 100 UNIT/ML ~~LOC~~ SOLN
0.0000 [IU] | Freq: Three times a day (TID) | SUBCUTANEOUS | Status: DC
  Administered 2016-08-23: 2 [IU] via SUBCUTANEOUS

## 2016-08-22 MED ORDER — ATORVASTATIN CALCIUM 40 MG PO TABS
40.0000 mg | ORAL_TABLET | Freq: Every day | ORAL | Status: DC
  Administered 2016-08-22 – 2016-08-24 (×3): 40 mg via ORAL
  Filled 2016-08-22 (×3): qty 1

## 2016-08-22 MED ORDER — PIPERACILLIN-TAZOBACTAM 3.375 G IVPB
3.3750 g | Freq: Three times a day (TID) | INTRAVENOUS | Status: DC
  Administered 2016-08-23: 3.375 g via INTRAVENOUS
  Filled 2016-08-22: qty 50

## 2016-08-22 MED ORDER — IBUPROFEN 400 MG PO TABS: 400.0000 mg | ORAL_TABLET | ORAL | Status: DC | PRN

## 2016-08-22 MED ORDER — SODIUM CHLORIDE 0.9 % IV BOLUS (SEPSIS)
1000.0000 mL | Freq: Once | INTRAVENOUS | Status: AC
  Administered 2016-08-22: 1000 mL via INTRAVENOUS

## 2016-08-22 MED ORDER — ACETAMINOPHEN 325 MG PO TABS: 650.0000 mg | ORAL_TABLET | Freq: Four times a day (QID) | ORAL | Status: DC | PRN

## 2016-08-22 MED ORDER — DOXYCYCLINE HYCLATE 100 MG IV SOLR
100.0000 mg | Freq: Two times a day (BID) | INTRAVENOUS | Status: DC
  Administered 2016-08-22: 100 mg via INTRAVENOUS
  Filled 2016-08-22 (×4): qty 100

## 2016-08-22 MED ORDER — INSULIN ASPART 100 UNIT/ML ~~LOC~~ SOLN: 0.0000 [IU] | Freq: Every day | SUBCUTANEOUS | Status: DC

## 2016-08-22 MED ORDER — SENNOSIDES-DOCUSATE SODIUM 8.6-50 MG PO TABS: 1.0000 | ORAL_TABLET | Freq: Every evening | ORAL | Status: DC | PRN

## 2016-08-22 MED ORDER — OXYCODONE-ACETAMINOPHEN 10-325 MG PO TABS: 1.0000 | ORAL_TABLET | ORAL | Status: DC | PRN

## 2016-08-22 MED ORDER — OXYCODONE-ACETAMINOPHEN 5-325 MG PO TABS: 1.0000 | ORAL_TABLET | ORAL | Status: DC | PRN

## 2016-08-22 NOTE — ED Provider Notes (Signed)
[facility] DEPT Provider Note   CSN: [id number] Arrival date & time: 08/22/16  1625     History   Chief Complaint Chief Complaint  Patient presents with  . Weakness    HPI Alan Orr is a 71 y.o. male.  Level V caveat for urgent need for intervention. History obtained from wife, daughter, patient. Patient has not felt well for the past 3 weeks worse the past week. Today he was so weak that he could not get off the toilet. EMS was notified and patient was transferred to the ED. He was incontinent of urine and stool. Low-grade fever. No chest pain, dyspnea, sweats, chills, dysuria. He has multiple medical problems well documented in the past medical history.      Past Medical History:  Diagnosis Date  . CAD (coronary artery disease)   . DM (diabetes mellitus) ([place])    type 2   . DVT (deep venous thrombosis) ([place])   . Hyperlipidemia   . Hypertension   . ICD (implantable cardioverter-defibrillator), single, in situ   . Ischemic cardiomyopathy   . PAF (paroxysmal atrial fibrillation) ([place])   . RBBB   . S/P CABG x 5     Patient Active Problem List   Diagnosis Date Noted  . Positive blood culture 01/07/2015  . Gram-positive bacteremia 01/07/2015  . Bacteremia   . Abnormal serum protein electrophoresis 08/21/2014  . Leg wound, left 11/03/2013  . History of carotid artery disease 03/03/2013  . Cardiomyopathy, ischemic 11/08/2012  . CAD s/p CABG  11/08/2012  . Hyperlipidemia 11/08/2012  . Paroxysmal atrial fibrillation ([place]) 11/08/2012  . Ventricular tachycardia ([place]) 11/08/2012  . DM 08/20/2009  . Chronic systolic heart failure ([place]) 08/20/2009  . PACEMAKER, PERMANENT 08/20/2009  . Biventricular ICD (implantable cardioverter-defibrillator) in place 08/20/2009    Past Surgical History:  Procedure Laterality Date  . BACK SURGERY    . CARDIAC CATHETERIZATION  05/08/2009   small vessel disease  . CARDIAC DEFIBRILLATOR PLACEMENT  05/09/2009   [employer]tronic  .  CORONARY ARTERY BYPASS GRAFT  12/31/1995   LIMA to LAD,SVG to intermediate,SVG to CX,seq. svg to posterior descending and posterolateral RCA  . EP IMPLANTABLE DEVICE N/A 09/25/2015   Procedure: PPM/BIV PPM Generator Changeout;  Surgeon: Evans Lance, MD;  Location: [facility];  Service: Cardiovascular;  Laterality: N/A;  . I&D EXTREMITY Left 11/03/2013   Procedure: Left Leg Debride Ulcer, Apply Wound VAC and Theraskin;  Surgeon: Newt Minion, MD;  Location: [facility];  Service: Orthopedics;  Laterality: Left;  . TEE WITHOUT CARDIOVERSION N/A 01/11/2015   Procedure: TRANSESOPHAGEAL ECHOCARDIOGRAM (TEE);  Surgeon: Herminio Commons, MD;  [facility];  Service: Cardiology;  Laterality: N/A;       Home Medications    Prior to Admission medications   Medication Sig Start Date End Date Taking? Authorizing Provider  aspirin EC 81 MG tablet Take 81 mg by mouth at bedtime.   Yes [provider]  nitroGLYCERIN (NITROSTAT) 0.4 MG SL tablet Place 0.4 mg under the tongue every 5 (five) minutes x 3 doses as needed for chest pain. 10/31/15  Yes [provider]  ALPRAZolam Duanne Moron) 1 MG tablet Take 1 mg by mouth daily as needed for anxiety or sleep.     [provider]  amiodarone (PACERONE) 200 MG tablet Take 200 mg by mouth daily.    [provider]  atorvastatin (LIPITOR) 40 MG tablet Take 40 mg by mouth daily.    [provider]  levothyroxine (SYNTHROID, LEVOTHROID) 112 MCG tablet Take 112 mcg by mouth daily before breakfast.    [provider]  magnesium oxide (MAG-OX) 400 MG tablet Take 400 mg by mouth daily.    [provider]  metoprolol (LOPRESSOR) 50 MG tablet TAKE 1 TABLET BY MOUTH TWICE DAILY. 06/03/16   Evans Lance, MD  niacin (NIASPAN) 1000 MG CR tablet TAKE (1) TABLET BY MOUTH AT BEDTIME. 09/16/15   Lorretta Harp, MD  oxyCODONE-acetaminophen (PERCOCET) 10-325 MG per tablet Take 1 tablet by mouth every 4  (four) hours as needed for pain.    [provider]  pioglitazone (ACTOS) 45 MG tablet Take 45 mg by mouth daily.    [provider]  PRADAXA 75 MG CAPS capsule TAKE 1 CAPSULE BY MOUTH EVERY 12 HOURS. 02/03/16   Croitoru, Mihai, MD  ramipril (ALTACE) 2.5 MG capsule Take 2.5 mg by mouth at bedtime.     [provider]  SYNTHROID 137 MCG tablet  07/20/16   [provider]  ZETIA 10 MG tablet TAKE 1 TABLET ONCE DAILY FOR CHOLESTEROL. 09/16/15   Lorretta Harp, MD  zolpidem (AMBIEN) 10 MG tablet Take 10 mg by mouth at bedtime as needed for sleep.    [provider]    Family History Family History  Problem Relation Age of Onset  . Heart attack Mother   . Stroke Father     Social History Social History  Substance Use Topics  . Smoking status: Former Smoker    Packs/day: 1.50    Years: 35.00    Types: Cigarettes    Quit date: 11/03/1995  . Smokeless tobacco: Never Used  . Alcohol use No     Allergies   Patient has no known allergies.   Review of Systems Review of Systems  Reason unable to perform ROS: Urgent need for intervention.     Physical Exam Updated Vital Signs BP (!) 106/45   Pulse 68   Temp 100.3 F (37.9 C) (Oral)   Resp 15   SpO2 95%   Physical Exam  Constitutional:  Pale, slightly lethargic, no acute distress  HENT:  Head: Normocephalic and atraumatic.  Eyes: Conjunctivae are normal.  Neck: Neck supple.  Cardiovascular: Normal rate and regular rhythm.   Pulmonary/Chest: Effort normal and breath sounds normal.  Abdominal: Soft. Bowel sounds are normal.  Musculoskeletal: Normal range of motion.  Neurological: He is alert.  Skin: Skin is warm and dry.  Psychiatric:  Flat affect  Nursing note and vitals reviewed.    ED Treatments / Results  Labs (all labs ordered are listed, but only abnormal results are displayed) Labs Reviewed  COMPREHENSIVE METABOLIC PANEL - Abnormal; Notable for the following:        Result Value   Sodium 132 (*)    Chloride 97 (*)    Glucose, Bld 136 (*)    BUN 31 (*)    Creatinine, Ser 2.25 (*)    Calcium 7.5 (*)    Total Protein 5.1 (*)    Albumin 2.0 (*)    AST 211 (*)    ALT 181 (*)    GFR calc non Af Amer 28 (*)    GFR calc Af Amer 32 (*)    All other components within normal limits  CBC WITH DIFFERENTIAL/PLATELET - Abnormal; Notable for the following:    RBC 3.85 (*)    Hemoglobin 12.7 (*)    HCT 37.4 (*)  RDW 15.9 (*)    Platelets 74 (*)    All other components within normal limits  I-STAT CG4 LACTIC ACID, ED - Abnormal; Notable for the following:    Lactic Acid, Venous 4.82 (*)    All other components within normal limits  CULTURE, BLOOD (ROUTINE X 2)  CULTURE, BLOOD (ROUTINE X 2)  URINALYSIS, ROUTINE W REFLEX MICROSCOPIC    EKG  EKG Interpretation None       Radiology Dg Chest Port 1 View  Result Date: 08/22/2016 CLINICAL DATA:  Weakness x3 weeks EXAM: PORTABLE CHEST 1 VIEW COMPARISON:  03/06/2015 FINDINGS: Lungs are clear.  No pleural effusion or pneumothorax. The heart is normal in size. Postsurgical changes related to prior CABG. Left subclavian ICD. IMPRESSION: No evidence of acute cardiopulmonary disease. Electronically Signed   By: Julian Hy M.D.   On: 08/22/2016 17:17    Procedures Procedures (including critical care time)  Medications Ordered in ED Medications  sodium chloride 0.9 % bolus 1,000 mL (0 mLs Intravenous Stopped 08/22/16 1747)    And  sodium chloride 0.9 % bolus 1,000 mL (1,000 mLs Intravenous New Bag/Given 08/22/16 1728)    And  sodium chloride 0.9 % bolus 500 mL (not administered)  piperacillin-tazobactam (ZOSYN) IVPB 3.375 g (3.375 g Intravenous New Bag/Given 08/22/16 1729)  vancomycin (VANCOCIN) 1,500 mg in sodium chloride 0.9 % 500 mL IVPB (not administered)     Initial Impression / Assessment and Plan / ED Course  I have reviewed the triage vital signs and the nursing notes.  Pertinent labs &  imaging results that were available during my care of the patient were reviewed by me and considered in my medical decision making (see chart for details).     CRITICAL CARE Performed by: Nat Christen  ?  Total critical care time: 30 minutes  Critical care time was exclusive of separately billable procedures and treating other patients.  Critical care was necessary to treat or prevent imminent or life-threatening deterioration.  Critical care was time spent personally by me on the following activities: development of treatment plan with patient and/or surrogate as well as nursing, discussions with consultants, evaluation of patient's response to treatment, examination of patient, obtaining history from patient or surrogate, ordering and performing treatments and interventions, ordering and review of laboratory studies, ordering and review of radiographic studies, pulse oximetry and re-evaluation of patient's condition.   Sepsis protocol initiated. Vigorous IV hydration and antibiotics initiated. Blood pressure has remained stable in the 100 range. He is not tachycardic. Will admit to general medicine.  Final Clinical Impressions(s) / ED Diagnoses   Final diagnoses:  Sepsis, due to unspecified organism [facility])    New Prescriptions New Prescriptions   No medications on file     Nat Christen, MD 08/22/16 1758

## 2016-08-22 NOTE — ED Notes (Signed)
Per phlebotomist both cultures drawn at this time.

## 2016-08-22 NOTE — Progress Notes (Signed)
Pharmacy Antibiotic Note  Alan Orr is a 71 y.o.  admitted on 08/22/2016 with sepsis.  Pharmacy has been consulted for Vancomycin and Zosyn dosing. Calculated CrCl 36 ml/min  Plan: Zosyn 3.375 GM IV every 8 hours Vancomycin 1500 mg IV x 1 dose, then 1250 mg IV every 24 hours Labs per protocol      Temp (24hrs), Avg:100.3 F (37.9 C), Min:100.3 F (37.9 C), Max:100.3 F (37.9 C)   Recent Labs Lab 08/22/16 1708  LATICACIDVEN 4.82*    CrCl cannot be calculated (Patient's most recent lab result is older than the maximum 21 days allowed.).    No Known Allergies  Antimicrobials this admission: Zosyn 6/9 >>  Vancomycin 6/9 >>   Dose adjustments this admission:   Microbiology results:  BCx:   UCx:    Sputum:    MRSA PCR:   Thank you for allowing pharmacy to be a part of this patient's care.  Chriss Czar 08/22/2016 5:17 PM

## 2016-08-22 NOTE — ED Notes (Signed)
MD Stinson notified of pt's BP at this time. Verbal order given.

## 2016-08-22 NOTE — ED Notes (Signed)
Pt found incontinent of stool and urine. Denies pain.

## 2016-08-22 NOTE — H&P (Addendum)
History and Physical  Alan Orr NOI:[id number] DOB: 1946/02/27 DOA: 08/22/2016  Referring physician: Dr Lacinda Axon, ED physician PCP: [name], MD  Outpatient Specialists:   Dr Gwenlyn Found (Cardiology)  Dr Lovena Le (EP)   Patient Coming From: home  Chief Complaint: weakness, fever  HPI: Alan Orr is a 71 y.o. male with a history of coronary artery disease status post CABG 5 vessels, type 2 diabetes, history of DVT, hyperlipidemia, hypertension, ischemic cardiomyopathy with systolic heart failure with EF of 50-55% on echocardiogram 12/2014, paroxysmal atrial fibrillation in sinus rhythm on amiodarone therapy, complete heart block with pacemaker. Patient has been having weakness over the past 3 weeks with intermittent fevers and chills that have worsened since Wednesday. The patient's appetite is diminished and he hasn't been taking a lot of fluid. His wife has been trying to get him to the hospital, although the patient has been resistant. Today, the patient went to bathroom after his wife had left her on some errands. The patient went to the bathroom but slid on the floor when his unable to get up by himself. His wife came home and found him in the bathroom, called EMS, and the patient was brought to the hospital.  Incidentally, the patient states that he had a tick bite a few weeks ago on his right upper thigh. The tick was in place overnight - the patient was unable to say definitely for how long. The patient denies having a rash in the area.  Emergency Department Course: Code services was called. Broad spectrum antibiotics were started after blood cultures and urine culture obtained. Patient received 2-1/2 L (30 mg/kg). Chest x-ray was normal. Blood work shows elevated creatinine from baseline, elevated LFTs, elevated lactic acid 4.8, and decreased platelet count to 74.  Review of Systems:   Pt denies any nausea, vomiting, diarrhea, constipation, abdominal pain, shortness of  breath, dyspnea on exertion, orthopnea, cough, wheezing, palpitations, headache, vision changes, lightheadedness, dizziness, melena, rectal bleeding.  Review of systems are otherwise negative  Past Medical History:  Diagnosis Date  . CAD (coronary artery disease)   . DM (diabetes mellitus) ([place])    type 2   . DVT (deep venous thrombosis) ([place])   . Hyperlipidemia   . Hypertension   . ICD (implantable cardioverter-defibrillator), single, in situ   . Ischemic cardiomyopathy   . PAF (paroxysmal atrial fibrillation) ([place])   . RBBB   . S/P CABG x 5    Past Surgical History:  Procedure Laterality Date  . BACK SURGERY    . CARDIAC CATHETERIZATION  05/08/2009   small vessel disease  . CARDIAC DEFIBRILLATOR PLACEMENT  05/09/2009   [employer]  . CORONARY ARTERY BYPASS GRAFT  12/31/1995   LIMA to LAD,SVG to intermediate,SVG to CX,seq. svg to posterior descending and posterolateral RCA  . EP IMPLANTABLE DEVICE N/A 09/25/2015   Procedure: PPM/BIV PPM Generator Changeout;  Surgeon: Evans Lance, MD;  Location: [facility];  Service: Cardiovascular;  Laterality: N/A;  . I&D EXTREMITY Left 11/03/2013   Procedure: Left Leg Debride Ulcer, Apply Wound VAC and [place];  Surgeon: Newt Minion, MD;  Location: [facility];  Service: Orthopedics;  Laterality: Left;  . TEE WITHOUT CARDIOVERSION N/A 01/11/2015   Procedure: TRANSESOPHAGEAL ECHOCARDIOGRAM (TEE);  Surgeon: Herminio Commons, MD;  Location: [facility];  Service: Cardiology;  Laterality: N/A;   Social History:  reports that he quit smoking about 20 years ago. His smoking use included Cigarettes. He has a 52.50 pack-year  smoking history. He has never used smokeless tobacco. He reports that he does not drink alcohol or use drugs. Patient lives at home  No Known Allergies  Family History  Problem Relation Age of Onset  . Heart attack Mother   . Stroke Father       Prior to Admission medications   Medication Sig Start Date End Date  Taking? Authorizing Provider  aspirin EC 81 MG tablet Take 81 mg by mouth at bedtime.   Yes [provider]  nitroGLYCERIN (NITROSTAT) 0.4 MG SL tablet Place 0.4 mg under the tongue every 5 (five) minutes x 3 doses as needed for chest pain. 10/31/15  Yes [provider]  ALPRAZolam Duanne Moron) 1 MG tablet Take 1 mg by mouth daily as needed for anxiety or sleep.     [provider]  amiodarone (PACERONE) 200 MG tablet Take 200 mg by mouth daily.    [provider]  atorvastatin (LIPITOR) 40 MG tablet Take 40 mg by mouth daily.    [provider]  levothyroxine (SYNTHROID, LEVOTHROID) 112 MCG tablet Take 112 mcg by mouth daily before breakfast.    [provider]  magnesium oxide (MAG-OX) 400 MG tablet Take 400 mg by mouth daily.    [provider]  metoprolol (LOPRESSOR) 50 MG tablet TAKE 1 TABLET BY MOUTH TWICE DAILY. 06/03/16   Evans Lance, MD  niacin (NIASPAN) 1000 MG CR tablet TAKE (1) TABLET BY MOUTH AT BEDTIME. 09/16/15   Lorretta Harp, MD  oxyCODONE-acetaminophen (PERCOCET) 10-325 MG per tablet Take 1 tablet by mouth every 4 (four) hours as needed for pain.    [provider]  pioglitazone (ACTOS) 45 MG tablet Take 45 mg by mouth daily.    [provider]  PRADAXA 75 MG CAPS capsule TAKE 1 CAPSULE BY MOUTH EVERY 12 HOURS. 02/03/16   Croitoru, Mihai, MD  ramipril (ALTACE) 2.5 MG capsule Take 2.5 mg by mouth at bedtime.     [provider]  SYNTHROID 137 MCG tablet  07/20/16   [provider]  ZETIA 10 MG tablet TAKE 1 TABLET ONCE DAILY FOR CHOLESTEROL. 09/16/15   Lorretta Harp, MD  zolpidem (AMBIEN) 10 MG tablet Take 10 mg by mouth at bedtime as needed for sleep.    [provider]    Physical Exam: BP (!) 107/42   Pulse 67   Temp 100.3 F (37.9 C) (Oral)   Resp 17   SpO2 98%   General: Elderly Caucasian male. Awake and alert and oriented x3. No acute cardiopulmonary distress.    HEENT: Normocephalic atraumatic.  Right and left ears normal in appearance.  Pupils equal, round, reactive to light. Extraocular muscles are intact. Sclerae anicteric and noninjected.  Moist mucosal membranes. No mucosal lesions.  Neck: Neck supple without lymphadenopathy. No carotid bruits. No masses palpated.  Cardiovascular: Regular rate with normal S1-S2 sounds. No murmurs, rubs, gallops auscultated. No JVD.  Respiratory: Good respiratory effort with no wheezes, rales, rhonchi. Lungs clear to auscultation bilaterally.  No accessory muscle use. Abdomen: Soft, nontender, nondistended. Active bowel sounds. No masses or hepatosplenomegaly  Skin: No tics seen. No rashes, lesions, or ulcerations.  Dry, warm to touch. 2+ dorsalis pedis and radial pulses. No lymphadenopathy in the cervical, supraclavicular, inguinal, axillary areas. Musculoskeletal: No calf or leg pain. All major joints not erythematous nontender.  No upper or lower joint deformation.  Good ROM.  No contractures  Psychiatric: Intact judgment and insight. Pleasant and cooperative.  Neurologic: No focal neurological deficits. Strength is 5/5 and symmetric in upper and lower extremities.  Cranial nerves II through XII are grossly intact.           Labs on Admission: I have personally reviewed following labs and imaging studies  CBC:  Recent Labs Lab 08/22/16 1708  WBC 7.2  NEUTROABS 3.8  HGB 12.7*  HCT 37.4*  MCV 97.1  PLT 74*   Basic Metabolic Panel:  Recent Labs Lab 08/22/16 1708  NA 132*  K 3.6  CL 97*  CO2 23  GLUCOSE 136*  BUN 31*  CREATININE 2.25*  CALCIUM 7.5*   GFR: CrCl cannot be calculated (Unknown ideal weight.). Liver Function Tests:  Recent Labs Lab 08/22/16 1708  AST 211*  ALT 181*  ALKPHOS 126  BILITOT 1.1  PROT 5.1*  ALBUMIN 2.0*   No results for input(s): LIPASE, AMYLASE in the last 168 hours. No results for input(s): AMMONIA in the last 168 hours. Coagulation Profile: No results  for input(s): INR, PROTIME in the last 168 hours. Cardiac Enzymes: No results for input(s): CKTOTAL, CKMB, CKMBINDEX, TROPONINI in the last 168 hours. BNP (last 3 results) No results for input(s): PROBNP in the last 8760 hours. HbA1C: No results for input(s): HGBA1C in the last 72 hours. CBG: No results for input(s): GLUCAP in the last 168 hours. Lipid Profile: No results for input(s): CHOL, HDL, LDLCALC, TRIG, CHOLHDL, LDLDIRECT in the last 72 hours. Thyroid Function Tests: No results for input(s): TSH, T4TOTAL, FREET4, T3FREE, THYROIDAB in the last 72 hours. Anemia Panel: No results for input(s): VITAMINB12, FOLATE, FERRITIN, TIBC, IRON, RETICCTPCT in the last 72 hours. Urine analysis:    Component Value Date/Time   COLORURINE YELLOW 03/06/2015 [place] 03/06/2015 1445   LABSPEC >1.030 (H) 03/06/2015 1445   PHURINE 5.0 03/06/2015 1445   GLUCOSEU NEGATIVE 03/06/2015 1445   HGBUR NEGATIVE 03/06/2015 1445   BILIRUBINUR NEGATIVE 03/06/2015 1445   KETONESUR NEGATIVE 03/06/2015 1445   PROTEINUR TRACE (A) 03/06/2015 1445   UROBILINOGEN 0.2 01/05/2015 1245   NITRITE NEGATIVE 03/06/2015 1445   LEUKOCYTESUR NEGATIVE 03/06/2015 1445   Sepsis Labs: @LABRCNTIP (procalcitonin:4,lacticidven:4) )No results found for this or any previous visit (from the past 240 hour(s)).   Radiological Exams on Admission: Dg Chest Port 1 View  Result Date: 08/22/2016 CLINICAL DATA:  Weakness x3 weeks EXAM: PORTABLE CHEST 1 VIEW COMPARISON:  03/06/2015 FINDINGS: Lungs are clear.  No pleural effusion or pneumothorax. The heart is normal in size. Postsurgical changes related to prior CABG. Left subclavian ICD. IMPRESSION: No evidence of acute cardiopulmonary disease. Electronically Signed   By: Julian Hy M.D.   On: 08/22/2016 17:17    EKG: Independently reviewed. Paced rhythm.  Assessment/Plan: Principal Problem:   Sepsis ([place]) Active Problems:   Type 2 diabetes mellitus with  stage 3 chronic kidney disease (HCC)   Chronic systolic heart failure (HCC)   CAD s/p CABG    Paroxysmal atrial fibrillation (HCC)   Elevated LFTs   Acute on chronic renal insufficiency   Lactic acidosis   Thrombocytopenia (HCC)   Hypocalcemia   Tick bite of right thigh    This patient was discussed with the ED physician, including pertinent vitals, physical exam findings, labs, and imaging.  We also discussed care given by the ED provider.  #1 sepsis of unknown etiology  Admit to stepdown  Sepsis recheck done  Blood pressure is maintained 329 systolically  Hold blood pressure medications  Status post 30 mL  per kilogram IV fluid bolus  Broad-spectrum antibiotics  Blood culture and urine culture obtained prior to start of antibiotics #2 lactic acidosis  Recheck lactic acid  Continue IV fluids #3 elevated LFTs  Question etiology: Shock liver versus tickborne illness as the patient had a recent tick bite.  Recheck LFTs tomorrow morning #4 acute on chronic renal insufficiency  Secondary to sepsis and dehydration  Continue IV fluid hydration and recheck creatinine in the morning #5 tick bite  Question whether this is the patient's etiology of his illness, particularly as many tickborne illnesses affect LFTs and can cause thrombocytopenia. Although the patient does not have a rash, a small percentage of patients with [facility] spotted fever do not have a rash.  We'll obtain serologies for Lyme disease, [place] spotted fever, ehrlichiosis, anaplasmosis  Start doxycycline after serologies obtained #6 thrombocytopenia  Question tick borne illness versus sepsis versus Pradaxa (case reports of thrombocytopenia with patients on Pradaxa)  We'll hold anticoagulation and trend platelet count. At the moment, the patient does not need to be transfused.  No active bleeding #7 chronic systolic heart failure  Compensated at the present. We'll watch symptoms and  continue with judicious fluid hydration. The patient may need diuresis once he is stable. #8 type 2 diabetes  On oral medications at home  Change to Lantus 10 units daily at bedtime and sliding scale insulin with CBGs before meals and daily at bedtime  Carb modified diet #9 paroxysmal atrial fibrillation  Hold Pradaxa  Currently in sinus rhythm  Hold amiodarone given patient's relative hypotension  DVT prophylaxis: SCDs Consultants: none Code Status: Full Family Communication: Wife present during entire interview  Disposition Plan: admission to stepdown   Truett Mainland, DO [facility] Pager [phone]  If 7PM-7AM, please contact night-coverage [email or web address] Password [email or web address]

## 2016-08-22 NOTE — ED Triage Notes (Signed)
Pt comes in from home by EMS. Pt has had weakness for 3 weeks. Upon arrival to home, pt was on the toilet and was hypotensive. Pt has IV established and is currently being give a bolus. Pt is alert and oriented. Per EMS, he recently had a defibrillator taken out and a pacemaker put in.

## 2016-08-22 NOTE — Progress Notes (Signed)
Sepsis - Repeat Assessment  Performed at:    1815  Vitals     Blood pressure (!) 107/42, pulse 67, temperature 100.3 F (37.9 C), temperature source Oral, resp. rate 17, SpO2 98 %.  Heart:     Regular rate and rhythm  Lungs:    CTA  Capillary Refill:   <2 sec  Peripheral Pulse:   Radial pulse palpable, Dorsalis pedis pulse  palpable and Posterior tibialis pulse  palpable  Skin:     Pale and Dry   Truett Mainland, DO 08/22/2016 6:32 PM

## 2016-08-22 NOTE — ED Notes (Signed)
Date and time results received: 08/22/16 .now   Test: I-Stat Lactic Acid Critical Value: 4.82  Name of Provider Notified: Lacinda Axon  Orders Received? Or Actions Taken?: no new actions at this time

## 2016-08-23 DIAGNOSIS — S70361A Insect bite (nonvenomous), right thigh, initial encounter: Secondary | ICD-10-CM

## 2016-08-23 DIAGNOSIS — E1122 Type 2 diabetes mellitus with diabetic chronic kidney disease: Secondary | ICD-10-CM

## 2016-08-23 DIAGNOSIS — I480 Paroxysmal atrial fibrillation: Secondary | ICD-10-CM

## 2016-08-23 DIAGNOSIS — N183 Chronic kidney disease, stage 3 (moderate): Secondary | ICD-10-CM

## 2016-08-23 DIAGNOSIS — N179 Acute kidney failure, unspecified: Secondary | ICD-10-CM

## 2016-08-23 DIAGNOSIS — A419 Sepsis, unspecified organism: Principal | ICD-10-CM

## 2016-08-23 DIAGNOSIS — R7989 Other specified abnormal findings of blood chemistry: Secondary | ICD-10-CM

## 2016-08-23 DIAGNOSIS — W57XXXA Bitten or stung by nonvenomous insect and other nonvenomous arthropods, initial encounter: Secondary | ICD-10-CM

## 2016-08-23 DIAGNOSIS — D696 Thrombocytopenia, unspecified: Secondary | ICD-10-CM

## 2016-08-23 LAB — COMPREHENSIVE METABOLIC PANEL
ALBUMIN: 1.8 g/dL — AB (ref 3.5–5.0)
ALK PHOS: 103 U/L (ref 38–126)
ALT: 150 U/L — ABNORMAL HIGH (ref 17–63)
AST: 178 U/L — AB (ref 15–41)
Anion gap: 5 (ref 5–15)
BILIRUBIN TOTAL: 1 mg/dL (ref 0.3–1.2)
BUN: 28 mg/dL — AB (ref 6–20)
CO2: 24 mmol/L (ref 22–32)
Calcium: 7 mg/dL — ABNORMAL LOW (ref 8.9–10.3)
Chloride: 105 mmol/L (ref 101–111)
Creatinine, Ser: 1.76 mg/dL — ABNORMAL HIGH (ref 0.61–1.24)
GFR calc Af Amer: 43 mL/min — ABNORMAL LOW (ref >60)
GFR calc non Af Amer: 37 mL/min — ABNORMAL LOW (ref >60)
GLUCOSE: 86 mg/dL (ref 65–99)
POTASSIUM: 3.4 mmol/L — AB (ref 3.5–5.1)
SODIUM: 134 mmol/L — AB (ref 135–145)
TOTAL PROTEIN: 4.4 g/dL — AB (ref 6.5–8.1)

## 2016-08-23 LAB — GLUCOSE, CAPILLARY
GLUCOSE-CAPILLARY: 79 mg/dL (ref 65–99)
GLUCOSE-CAPILLARY: 136 mg/dL — AB (ref 65–99)
GLUCOSE-CAPILLARY: 151 mg/dL — AB (ref 65–99)
GLUCOSE-CAPILLARY: 141 mg/dL — AB (ref 65–99)
Glucose-Capillary: 57 mg/dL — ABNORMAL LOW (ref 65–99)
Glucose-Capillary: 63 mg/dL — ABNORMAL LOW (ref 65–99)

## 2016-08-23 LAB — MRSA PCR SCREENING: MRSA BY PCR: NEGATIVE

## 2016-08-23 LAB — CBC
HEMATOCRIT: 33.5 % — AB (ref 39.0–52.0)
HEMOGLOBIN: 11.6 g/dL — AB (ref 13.0–17.0)
MCH: 33.3 pg (ref 26.0–34.0)
MCHC: 34.6 g/dL (ref 30.0–36.0)
MCV: 96.3 fL (ref 78.0–100.0)
Platelets: 80 10*3/uL — ABNORMAL LOW (ref 150–400)
RBC: 3.48 MIL/uL — ABNORMAL LOW (ref 4.22–5.81)
RDW: 15.9 % — AB (ref 11.5–15.5)
WBC: 9.2 10*3/uL (ref 4.0–10.5)

## 2016-08-23 LAB — LACTIC ACID, PLASMA
LACTIC ACID, VENOUS: 2.2 mmol/L — AB (ref 0.5–1.9)
Lactic Acid, Venous: 1.4 mmol/L (ref 0.5–1.9)
Lactic Acid, Venous: 1.1 mmol/L (ref 0.5–1.9)

## 2016-08-23 LAB — MAGNESIUM: Magnesium: 2 mg/dL (ref 1.7–2.4)

## 2016-08-23 LAB — PHOSPHORUS: Phosphorus: 3.1 mg/dL (ref 2.5–4.6)

## 2016-08-23 MED ORDER — CALCIUM CARBONATE ANTACID 500 MG PO CHEW
1.0000 | CHEWABLE_TABLET | Freq: Three times a day (TID) | ORAL | Status: DC
  Administered 2016-08-24 (×2): 200 mg via ORAL
  Filled 2016-08-23 (×2): qty 1

## 2016-08-23 MED ORDER — AMIODARONE HCL 200 MG PO TABS
200.0000 mg | ORAL_TABLET | Freq: Every day | ORAL | Status: DC
  Administered 2016-08-23: 200 mg via ORAL
  Filled 2016-08-23: qty 1

## 2016-08-23 MED ORDER — INSULIN GLARGINE 100 UNIT/ML ~~LOC~~ SOLN: 5.0000 [IU] | Freq: Every day | SUBCUTANEOUS | Status: DC

## 2016-08-23 MED ORDER — MAGNESIUM OXIDE 400 (241.3 MG) MG PO TABS
400.0000 mg | ORAL_TABLET | Freq: Every day | ORAL | Status: DC
  Administered 2016-08-23 – 2016-08-24 (×2): 400 mg via ORAL
  Filled 2016-08-23 (×2): qty 1

## 2016-08-23 MED ORDER — DOXYCYCLINE HYCLATE 100 MG IV SOLR
100.0000 mg | Freq: Two times a day (BID) | INTRAVENOUS | Status: DC
  Administered 2016-08-23 (×2): 100 mg via INTRAVENOUS
  Filled 2016-08-23 (×5): qty 100

## 2016-08-23 MED ORDER — VANCOMYCIN HCL IN DEXTROSE 750-5 MG/150ML-% IV SOLN
750.0000 mg | Freq: Two times a day (BID) | INTRAVENOUS | Status: DC
  Filled 2016-08-23 (×3): qty 150

## 2016-08-23 MED ORDER — SODIUM CHLORIDE 0.9 % IV BOLUS (SEPSIS)
500.0000 mL | Freq: Once | INTRAVENOUS | Status: AC
  Administered 2016-08-23: 500 mL via INTRAVENOUS

## 2016-08-23 MED ORDER — DABIGATRAN ETEXILATE MESYLATE 75 MG PO CAPS
75.0000 mg | ORAL_CAPSULE | Freq: Two times a day (BID) | ORAL | Status: DC
  Administered 2016-08-23 – 2016-08-24 (×3): 75 mg via ORAL
  Filled 2016-08-23 (×8): qty 1

## 2016-08-23 MED ORDER — ASPIRIN EC 81 MG PO TBEC
81.0000 mg | DELAYED_RELEASE_TABLET | Freq: Every day | ORAL | Status: DC
  Administered 2016-08-23: 81 mg via ORAL
  Filled 2016-08-23: qty 1

## 2016-08-23 MED ORDER — LEVOTHYROXINE SODIUM 112 MCG PO TABS
112.0000 ug | ORAL_TABLET | Freq: Every day | ORAL | Status: DC
  Administered 2016-08-24: 112 ug via ORAL
  Filled 2016-08-23: qty 1

## 2016-08-23 NOTE — Progress Notes (Signed)
PROGRESS NOTE  Alan Orr NGE:[id number] DOB: Feb 01, 1946 DOA: 08/22/2016 PCP: [name], MD  Brief Narrative: 71 year old man PMH diabetes mellitus type 2, CABG, paroxysmal atrial fibrillation, complete heart block status post pacemaker, presented with weakness for 3 weeks, intermittent fever and chills, multiple tick bites over the last few weeks. 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 No leukopenia. Unremarkable. No localizing symptoms. Lack of rash is not uncommon. -Narrow antibiotics to doxycycline, follow cultures -Serologies for Lyme disease, RM, Ehrlichiosis, anaplasmosis pending -trend plts and transaminases -continue IVF  #2 AKI superimposed on CKD stage III -improving, continue IVF, recheck BMP in AM  #3 DM type 2 -overall stable, one episode of hypoglycemia. SSI. Resume oral med on d/c.  #4 PAF, currently SR. Stable -continue Pradaxa. Resume amiodarone near future as BP improves  PMH: CABG, pacemaker.   Overall better, transfer to medical floor  DVT prophylaxis: SCDs Code Status: full Family Communication: wife at bedside Disposition Plan: home    Alan Hodgkins, MD  [facility] Direct contact: [phone] --[place] [place]  --[email or web address]; password [email or web address]  7PM-7AM contact night coverage as above 08/23/2016, 9:56 AM  LOS: 1 day   Consultants:    Procedures:    Antimicrobials:  Doxycycline 6/9 >>  Vancomycin 6/9  Zosyn 6/9   Interval history/Subjective: Feels much better today. Eating breakfast. Breathing okay. No pain. No complaints.  Objective: Vitals: Maximum temperature 100.3, blood pressure 92/49, pulse 74, respirations 19  Exam:     Constitutional: Appears calm, comfortable. Eating breakfast.  Eyes:  Pupils, irises, lids appear normal  ENT: Lips and tongue appear grossly normal. Slightly hard of hearing.  Cardiovascular: Regular rate and rhythm. No murmur, rub or gallop. No lower extremity edema.  Respiratory: Clear to auscultation bilaterally. No wheezes, rales or rhonchi. Normal respiratory effort.   Abdomen: Soft, nontender, nondistended  Skin: No rash or induration noted. Nontender palpation.  Musculoskeletal: Moves all extremities well.  Psychiatric: Grossly normal mood and affect. Speech fluent and appropriate.   I have personally reviewed the following:   Labs:  1 episode of hypoglycemia.  BUN and creatinine improved, 28, 1.76. Potassium 3.4. AST and ALT trending downwards. Total bilirubin within normal limits.   Lactic acid has normalized.   Platelet count trending up, 80. Hemoglobin stable 11.6. No leukocytosis.  Blood cultures pending  Imaging studies:  Chest x-ray and apparently reviewed, no acute disease  Medical tests:  EKG paced rhythm   Test discussed with performing physician:    Decision to obtain old records:    Review and summation of old records:    Scheduled Meds: . amiodarone  200 mg Oral Daily  . aspirin EC  81 mg Oral QHS  . atorvastatin  40 mg Oral Daily  . dabigatran  75 mg Oral Q12H  . ezetimibe  10 mg Oral Daily  . feeding supplement (ENSURE ENLIVE)  237 mL Oral BID BM  . insulin aspart  0-15 Units Subcutaneous TID WC  . insulin aspart  0-5 Units Subcutaneous QHS  . levothyroxine  112 mcg Oral QAC breakfast  . magnesium oxide  400 mg Oral Daily   Continuous Infusions: . 0.9 % NaCl with KCl 40 mEq / L 100 mL/hr (08/23/16 0730)  . doxycycline (VIBRAMYCIN) IV Stopped (08/23/16 1140)    Principal Problem:   Sepsis ([place])  Active Problems:   Type 2 diabetes mellitus with stage 3 chronic kidney disease (HCC)   CAD s/p CABG    Paroxysmal atrial fibrillation (HCC)   Thrombocytopenia (HCC)   Tick bite of right thigh    AKI (acute kidney injury) ([place])   LOS: 1 day

## 2016-08-23 NOTE — Progress Notes (Signed)
Patient resting in bed. Spouse at the bedside. Vital signs are stable. IV patent. Denies pain. Report called to Sharyn Lull, Therapist, sports. Patient transferred to room 310 via wheelchair with nursing staff.

## 2016-08-23 NOTE — Progress Notes (Signed)
Blood sugar rechecked and is 151.

## 2016-08-23 NOTE — Progress Notes (Signed)
Patient's blood sugar is 63, patient given 4 ounces of orange juice and 4 ounces of apple juice along with diner tray.  Will recheck blood sugar in about 30 minutes.

## 2016-08-24 LAB — CBC
HCT: 32.7 % — ABNORMAL LOW (ref 39.0–52.0)
HEMOGLOBIN: 11.4 g/dL — AB (ref 13.0–17.0)
MCH: 33.3 pg (ref 26.0–34.0)
MCHC: 34.9 g/dL (ref 30.0–36.0)
MCV: 95.6 fL (ref 78.0–100.0)
Platelets: 82 10*3/uL — ABNORMAL LOW (ref 150–400)
RBC: 3.42 MIL/uL — AB (ref 4.22–5.81)
RDW: 16.3 % — ABNORMAL HIGH (ref 11.5–15.5)
WBC: 8.5 10*3/uL (ref 4.0–10.5)

## 2016-08-24 LAB — GLUCOSE, CAPILLARY
GLUCOSE-CAPILLARY: 78 mg/dL (ref 65–99)
GLUCOSE-CAPILLARY: 125 mg/dL — AB (ref 65–99)
GLUCOSE-CAPILLARY: 95 mg/dL (ref 65–99)

## 2016-08-24 LAB — COMPREHENSIVE METABOLIC PANEL
ALK PHOS: 97 U/L (ref 38–126)
ALT: 132 U/L — ABNORMAL HIGH (ref 17–63)
ANION GAP: 4 — AB (ref 5–15)
AST: 163 U/L — ABNORMAL HIGH (ref 15–41)
Albumin: 1.7 g/dL — ABNORMAL LOW (ref 3.5–5.0)
BUN: 21 mg/dL — ABNORMAL HIGH (ref 6–20)
CHLORIDE: 103 mmol/L (ref 101–111)
CO2: 22 mmol/L (ref 22–32)
Calcium: 7.2 mg/dL — ABNORMAL LOW (ref 8.9–10.3)
Creatinine, Ser: 1.21 mg/dL (ref 0.61–1.24)
GFR calc non Af Amer: 58 mL/min — ABNORMAL LOW (ref >60)
Glucose, Bld: 118 mg/dL — ABNORMAL HIGH (ref 65–99)
POTASSIUM: 4.4 mmol/L (ref 3.5–5.1)
SODIUM: 129 mmol/L — AB (ref 135–145)
Total Bilirubin: 0.8 mg/dL (ref 0.3–1.2)
Total Protein: 4.4 g/dL — ABNORMAL LOW (ref 6.5–8.1)

## 2016-08-24 LAB — HEMOGLOBIN A1C
HEMOGLOBIN A1C: 5.9 % — AB (ref 4.8–5.6)
MEAN PLASMA GLUCOSE: 123 mg/dL

## 2016-08-24 MED ORDER — DOXYCYCLINE HYCLATE 100 MG PO TABS: 100.0000 mg | ORAL_TABLET | Freq: Two times a day (BID) | ORAL | 0 refills | Status: DC

## 2016-08-24 MED ORDER — DOXYCYCLINE HYCLATE 100 MG PO TABS
100.0000 mg | ORAL_TABLET | Freq: Two times a day (BID) | ORAL | Status: DC
  Administered 2016-08-24: 100 mg via ORAL
  Filled 2016-08-24: qty 1

## 2016-08-24 NOTE — Care Management Important Message (Signed)
Important Message  Patient Details  Name: DEMECO DUCKSWORTH MRN: [id number] Date of Birth: [birth year]   Medicare Important Message Given:  Yes    Yug Loria, Chauncey Reading, RN 08/24/2016, 10:51 AM

## 2016-08-24 NOTE — Discharge Summary (Signed)
Physician Discharge Summary  Oiva Dibari Clementson VPX:[id number] DOB: 1945/04/15 DOA: 08/22/2016  PCP: [name], MD  Admit date: 08/22/2016 Discharge date: 08/24/2016  Recommendations for Outpatient Follow-up:  1. Resolution of presumed tickborne illness, thrombocytopenia, elevated transaminases.   Follow-up Information    [name], MD Follow up in 1 week(s).   Specialty:  Internal Medicine Why:  Call to schedule a hospital follow-up appointment within one week. Contact information: [address] [place] [address] [phone]            Discharge Diagnoses:  1. Sepsis 2. Presumed tickborne illness 3. Acute kidney injury  4. Diabetes mellitus type 2 5. Paroxysmal atrial fibrillation  Discharge Condition: improved Disposition: home  Diet recommendation: heart healthy   Filed Weights   08/22/16 2018 08/22/16 2232 08/23/16 0500  Weight: 75.5 kg (166 lb 7.2 oz) 84.4 kg (186 lb 1.1 oz) 84.4 kg (186 lb 1.1 oz)    History of present illness:  71 year old man PMH diabetes mellitus type 2, CABG, paroxysmal atrial fibrillation, complete heart block status post pacemaker, presented with weakness for 3 weeks, intermittent fever and chills, multiple tick bites over the last few weeks. Admitted for sepsis, lactic acidosis, elevated LFTs, acute kidney injury, tick bite with concern for tickborne illness.  Hospital Course:  Patient was treated with empiric doxycycline with rapid clinical improvement. Platelets stabilized and transaminases trending down. History, clinical presentation and laboratory findings most consistent with tickborne illness, likely ehrlichiosis. Hospitalization was uncomplicated. Individual issues as below.  #1 sepsis, presumably secondary to tickborne illness. Associated with elevated LFTs and thrombocytopenia.  #2: Acute kidney injury superimposed on chronic kidney disease stage III, resolved with IV fluids.  #3: Modest hyponatremia,  asymptomatic. Expect spontaneous resolution as patient continues to improve.  #4: Diabetes mellitus type 2, remained stable during this hospitalization.  #5: Paroxysmal atrial fibrillation, remained stable during this hospitalization. Continue Pradaxa, metoprolol and amiodarone.  Today's assessment: S: Feeling better. Eating well. No complaints. O: Vitals: Temperature 98.2, respirations 18, pulse 64, blood pressure 120/61, his PO2 99% on room air.   Constitutional: Appears calm, comfortable.  Cardiovascular: Regular rate and rhythm. No murmur, rubs or gallop. No lower extremity edema.  Respiratory: Clear to auscultation bilaterally. No wheezes, rales or rhonchi. Normal respiratory effort.   Psychiatric: Grossly normal mood and affect.  Discharge Instructions  Discharge Instructions    Diet - low sodium heart healthy    Complete by:  As directed    Discharge instructions    Complete by:  As directed    Call your physician or seek immediate medical attention for fever, rash, pain, fatigue, confusion, vomiting or worsening of condition.   Increase activity slowly    Complete by:  As directed      Allergies as of 08/24/2016   No Known Allergies     Medication List    STOP taking these medications   pioglitazone 45 MG tablet Commonly known as:  ACTOS     TAKE these medications   ALPRAZolam 1 MG tablet Commonly known as:  XANAX Take 1 mg by mouth daily as needed for anxiety or sleep.   amiodarone 200 MG tablet Commonly known as:  PACERONE Take 200 mg by mouth daily.   aspirin EC 81 MG tablet Take 81 mg by mouth at bedtime.   atorvastatin 40 MG tablet Commonly known as:  LIPITOR Take 40 mg by mouth daily.   doxycycline 100 MG tablet Commonly known as:  VIBRA-TABS Take 1 tablet (100  mg total) by mouth every 12 (twelve) hours.   ferrous sulfate 325 (65 FE) MG tablet Take 325 mg by mouth daily with breakfast.   magnesium oxide 400 MG tablet Commonly known as:   MAG-OX Take 400 mg by mouth daily.   metoprolol tartrate 50 MG tablet Commonly known as:  LOPRESSOR TAKE 1 TABLET BY MOUTH TWICE DAILY.   MULTI-VITAMINS Tabs Take 1 tablet by mouth daily.   niacin 1000 MG CR tablet Commonly known as:  NIASPAN TAKE (1) TABLET BY MOUTH AT BEDTIME.   nitroGLYCERIN 0.4 MG SL tablet Commonly known as:  NITROSTAT Place 0.4 mg under the tongue every 5 (five) minutes x 3 doses as needed for chest pain.   oxyCODONE-acetaminophen 10-325 MG tablet Commonly known as:  PERCOCET Take 1 tablet by mouth every 4 (four) hours as needed for pain.   PRADAXA 75 MG Caps capsule Generic drug:  dabigatran TAKE 1 CAPSULE BY MOUTH EVERY 12 HOURS.   ramipril 2.5 MG capsule Commonly known as:  ALTACE Take 2.5 mg by mouth at bedtime.   SYNTHROID 137 MCG tablet Generic drug:  levothyroxine Take 137 mcg by mouth daily before breakfast.   ZETIA 10 MG tablet Generic drug:  ezetimibe TAKE 1 TABLET ONCE DAILY FOR CHOLESTEROL.   zolpidem 10 MG tablet Commonly known as:  AMBIEN Take 10 mg by mouth at bedtime as needed for sleep.      No Known Allergies  The results of significant diagnostics from this hospitalization (including imaging, microbiology, ancillary and laboratory) are listed below for reference.    Significant Diagnostic Studies: Dg Chest Port 1 View  Result Date: 08/22/2016 CLINICAL DATA:  Weakness x3 weeks EXAM: PORTABLE CHEST 1 VIEW COMPARISON:  03/06/2015 FINDINGS: Lungs are clear.  No pleural effusion or pneumothorax. The heart is normal in size. Postsurgical changes related to prior CABG. Left subclavian ICD. IMPRESSION: No evidence of acute cardiopulmonary disease. Electronically Signed   By: Julian Hy M.D.   On: 08/22/2016 17:17    Microbiology: Recent Results (from the past 240 hour(s))  Blood Culture (routine x 2)     Status: None (Preliminary result)   Collection Time: 08/22/16  5:14 PM  Result Value Ref Range Status   Specimen  Description BLOOD  Final   Special Requests NONE  Final   Culture NO GROWTH 2 DAYS  Final   Report Status PENDING  Incomplete  Blood Culture (routine x 2)     Status: None (Preliminary result)   Collection Time: 08/22/16  5:22 PM  Result Value Ref Range Status   Specimen Description BLOOD  Final   Special Requests NONE  Final   Culture NO GROWTH 2 DAYS  Final   Report Status PENDING  Incomplete  MRSA PCR Screening     Status: None   Collection Time: 08/22/16  7:40 PM  Result Value Ref Range Status   MRSA by PCR NEGATIVE NEGATIVE Final    Comment:        The GeneXpert MRSA Assay (FDA approved for NASAL specimens only), is one component of a comprehensive MRSA colonization surveillance program. It is not intended to diagnose MRSA infection nor to guide or monitor treatment for MRSA infections.      Labs: Basic Metabolic Panel:  Recent Labs Lab 08/22/16 1708 08/23/16 0253 08/24/16 1048  NA 132* 134* 129*  K 3.6 3.4* 4.4  CL 97* 105 103  CO2 23 24 22   GLUCOSE 136* 86 118*  BUN 31* 28* 21*  CREATININE 2.25* 1.76* 1.21  CALCIUM 7.5* 7.0* 7.2*  MG  --  2.0  --   PHOS  --  3.1  --    Liver Function Tests:  Recent Labs Lab 08/22/16 1708 08/23/16 0253 08/24/16 1048  AST 211* 178* 163*  ALT 181* 150* 132*  ALKPHOS 126 103 97  BILITOT 1.1 1.0 0.8  PROT 5.1* 4.4* 4.4*  ALBUMIN 2.0* 1.8* 1.7*   CBC:  Recent Labs Lab 08/22/16 1708 08/23/16 0253 08/24/16 1048  WBC 7.2 9.2 8.5  NEUTROABS 3.8  --   --   HGB 12.7* 11.6* 11.4*  HCT 37.4* 33.5* 32.7*  MCV 97.1 96.3 95.6  PLT 74* 80* 82*    CBG:  Recent Labs Lab 08/23/16 1837 08/23/16 2110 08/24/16 0720 08/24/16 0925 08/24/16 1146  GLUCAP 151* 141* 78 125* 95    Principal Problem:   Sepsis (HCC) Active Problems:   Type 2 diabetes mellitus with stage 3 chronic kidney disease (HCC)   CAD s/p CABG    Paroxysmal atrial fibrillation (HCC)   Thrombocytopenia (HCC)   Tick bite of right thigh   AKI  (acute kidney injury) ([place])   Time coordinating discharge: 40 minutes  Signed:  [name], MD [facility] 08/24/2016, 6:19 PM

## 2016-08-24 NOTE — Care Management Note (Signed)
Case Management Note  Patient Details  Name: Alan Orr MRN: [id number] Date of Birth: 1945-10-28  Subjective/Objective:  Adm with sepsis. From home with wife. Ind PTA. Walks with cane. Has PCP, still drives himself to appointments.  Reports no issues affording medications. Plans to return home with self care.            Action/Plan: No CM needs anticipated. Return home with self care anticipated.    Expected Discharge Date:       08/24/2016           Expected Discharge Plan:  Home/Self Care  In-House Referral:     Discharge planning Services  CM Consult  Post Acute Care Choice:    Choice offered to:  NA  DME Arranged:    DME Agency:     HH Arranged:    HH Agency:     Status of Service:  In process, will continue to follow  If discussed at Long Length of Stay Meetings, dates discussed:    Additional Comments:  Gerrianne Aydelott, Chauncey Reading, RN 08/24/2016, 10:52 AM

## 2016-08-24 NOTE — Progress Notes (Signed)
Patient IV removed this morning.  Infiltrated earlier but site improved.  Patient educated.  AVS reviewed with patient and patient's wife.  Verbalized understanding of discharge instructions, physician follow-up, medications.  Patient transported by nurse tech via wheelchair to entrance at discharge.  Patient reports belongings intact and in possession at time of discharge.  Patient stable at time of discharge.

## 2016-08-25 LAB — EHRLICHIA ANTIBODY PANEL
E chaffeensis (HGE) Ab, IgG: NEGATIVE
E chaffeensis (HGE) Ab, IgM: NEGATIVE
E. CHAFFEENSIS (HME) IGM TITER: NEGATIVE
E. CHAFFEENSIS IGG AB: NEGATIVE

## 2016-08-25 LAB — CALCIUM, IONIZED: CALCIUM, IONIZED, SERUM: 4 mg/dL — AB (ref 4.5–5.6)

## 2016-08-25 LAB — ROCKY MTN SPOTTED FVR ABS PNL(IGG+IGM)
RMSF IGG: POSITIVE — AB
RMSF IgM: 0.31 index (ref 0.00–0.89)

## 2016-08-25 LAB — RMSF, IGG, IFA

## 2016-08-26 LAB — MISC LABCORP TEST (SEND OUT)
LabCorp test name: 138172
Labcorp test code: 87798

## 2016-08-26 LAB — B. BURGDORFI ANTIBODIES: B BURGDORFERI AB IGG+ IGM: 1.91 {ISR} — AB (ref 0.00–0.90)

## 2016-08-27 LAB — CULTURE, BLOOD (ROUTINE X 2)
CULTURE: NO GROWTH
Culture: NO GROWTH

## 2016-08-31 DIAGNOSIS — A419 Sepsis, unspecified organism: Secondary | ICD-10-CM | POA: Diagnosis not present

## 2016-08-31 DIAGNOSIS — E871 Hypo-osmolality and hyponatremia: Secondary | ICD-10-CM | POA: Diagnosis not present

## 2016-08-31 DIAGNOSIS — E119 Type 2 diabetes mellitus without complications: Secondary | ICD-10-CM | POA: Diagnosis not present

## 2016-08-31 DIAGNOSIS — I959 Hypotension, unspecified: Secondary | ICD-10-CM | POA: Diagnosis not present

## 2016-08-31 DIAGNOSIS — A770 Spotted fever due to Rickettsia rickettsii: Secondary | ICD-10-CM | POA: Diagnosis not present

## 2016-08-31 DIAGNOSIS — Z1389 Encounter for screening for other disorder: Secondary | ICD-10-CM | POA: Diagnosis not present

## 2016-08-31 DIAGNOSIS — R5383 Other fatigue: Secondary | ICD-10-CM | POA: Diagnosis not present

## 2016-08-31 DIAGNOSIS — Z6821 Body mass index (BMI) 21.0-21.9, adult: Secondary | ICD-10-CM | POA: Diagnosis not present

## 2016-08-31 DIAGNOSIS — R42 Dizziness and giddiness: Secondary | ICD-10-CM | POA: Diagnosis not present

## 2016-08-31 DIAGNOSIS — R945 Abnormal results of liver function studies: Secondary | ICD-10-CM | POA: Diagnosis not present

## 2016-09-02 LAB — LYME, WESTERN BLOT, SERUM (REFLEXED)
IGG P39 AB.: ABSENT
IGG P93 AB.: ABSENT
IgG P18 Ab.: ABSENT
IgG P23 Ab.: ABSENT
IgG P28 Ab.: ABSENT
IgG P30 Ab.: ABSENT
IgG P66 Ab.: ABSENT
IgM P41 Ab.: ABSENT
Lyme IgG Wb: NEGATIVE
Lyme IgM Wb: POSITIVE — AB

## 2016-09-22 ENCOUNTER — Ambulatory Visit: Payer: Medicare Other | Admitting: Cardiovascular Disease

## 2016-09-24 ENCOUNTER — Other Ambulatory Visit: Payer: Self-pay | Admitting: Cardiovascular Disease

## 2016-09-24 NOTE — Telephone Encounter (Signed)
REFILL 

## 2016-10-01 ENCOUNTER — Telehealth: Payer: Self-pay | Admitting: Family Medicine

## 2016-10-01 ENCOUNTER — Other Ambulatory Visit: Payer: Self-pay | Admitting: Cardiovascular Disease

## 2016-10-01 NOTE — Telephone Encounter (Signed)
Lyme Western Blot noted.  Discussed with Dr. Linus Salmons, [place].  In absence of clinical syndrome (joint effusions, target lesion, etc) this should be considered a false positive. Patient did not have clinical findings to suggest Lyme's disease.  I updated PCP Dr. Gerarda Fraction of the above.  [name], MD

## 2016-10-09 ENCOUNTER — Ambulatory Visit (INDEPENDENT_AMBULATORY_CARE_PROVIDER_SITE_OTHER): Payer: Medicare Other | Admitting: Cardiovascular Disease

## 2016-10-09 ENCOUNTER — Encounter: Payer: Self-pay | Admitting: Cardiovascular Disease

## 2016-10-09 DIAGNOSIS — I2510 Atherosclerotic heart disease of native coronary artery without angina pectoris: Secondary | ICD-10-CM | POA: Diagnosis not present

## 2016-10-09 DIAGNOSIS — I255 Ischemic cardiomyopathy: Secondary | ICD-10-CM | POA: Diagnosis not present

## 2016-10-09 DIAGNOSIS — Z95810 Presence of automatic (implantable) cardiac defibrillator: Secondary | ICD-10-CM | POA: Diagnosis not present

## 2016-10-09 DIAGNOSIS — I480 Paroxysmal atrial fibrillation: Secondary | ICD-10-CM | POA: Diagnosis not present

## 2016-10-09 DIAGNOSIS — E7800 Pure hypercholesterolemia, unspecified: Secondary | ICD-10-CM

## 2016-10-09 NOTE — Progress Notes (Signed)
10/09/2016 Alan Orr [facility]   [birth year]  [id number]  Primary Physician [name], MD Primary Cardiologist: Lorretta Harp MD [name]  HPI:  The patient is a 71 year old mildly overweight married Caucasian male, father of 2, who I last saw in the office 08/20/15.Marland Kitchen He has a history of ischemic cardiomyopathy, status post coronary artery bypass grafting in 1997. He was catheterized by Dr. Rex Kras, May 08, 2009, revealing patent grafts with moderate LV dysfunction, unchanged anatomy. 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 His other problems include hypertension, hyperlipidemia, and type 2 diabetes. He has a BiV ICD, implanted by Dr. Cristopher Peru, which Dr. Sallyanne Kuster follows on a quarterly outpatient basis.he had a Myoview stress test performed 03/17/12 that showed scar in the anterior wall apex and septum with an EF of 42%. He is currently asymptomatic. Because of cellulitis and concern about potential endocarditis with indwelling leads he underwent a TEE by Dr. Jacinta Shoe  01/03/15 revealing normal LV systolic function. He was recently hospitalized for a tick bite. He denies chest pain or shortness of breath.   Current Meds  Medication Sig  . ALPRAZolam (XANAX) 1 MG tablet Take 1 mg by mouth daily as needed for anxiety or sleep.   Marland Kitchen amiodarone (PACERONE) 200 MG tablet Take 200 mg by mouth daily.  Marland Kitchen aspirin EC 81 MG tablet Take 81 mg by mouth at bedtime.  Marland Kitchen atorvastatin (LIPITOR) 40 MG tablet Take 40 mg by mouth daily.  Marland Kitchen ezetimibe (ZETIA) 10 MG tablet TAKE 1 TABLET ONCE DAILY FOR CHOLESTEROL.  . ferrous sulfate 325 (65 FE) MG tablet Take 325 mg by mouth daily with breakfast.  . magnesium oxide (MAG-OX) 400 MG tablet Take 400 mg by mouth daily.  . metoprolol (LOPRESSOR) 50 MG tablet TAKE 1 TABLET BY MOUTH TWICE DAILY. (Patient taking differently: TAKE 1 TABLET BY  MOUTH ONCE DAILY.)  . Multiple Vitamin (MULTI-VITAMINS) TABS Take 1 tablet by mouth daily.  . niacin (NIASPAN) 1000 MG CR tablet TAKE (1) TABLET BY MOUTH AT BEDTIME.  . nitroGLYCERIN (NITROSTAT) 0.4 MG SL tablet Place 0.4 mg under the tongue every 5 (five) minutes x 3 doses as needed for chest pain.  Marland Kitchen oxyCODONE-acetaminophen (PERCOCET) 10-325 MG per tablet Take 1 tablet by mouth every 4 (four) hours as needed for pain.  Marland Kitchen PRADAXA 75 MG CAPS capsule TAKE 1 CAPSULE BY MOUTH EVERY 12 HOURS.  . ramipril (ALTACE) 2.5 MG capsule Take 2.5 mg by mouth at bedtime.   Marland Kitchen SYNTHROID 137 MCG tablet Take 137 mcg by mouth daily before breakfast.   . zolpidem (AMBIEN) 10 MG tablet Take 10 mg by mouth at bedtime as needed for sleep.  . [DISCONTINUED] doxycycline (VIBRA-TABS) 100 MG tablet Take 1 tablet (100 mg total) by mouth every 12 (twelve) hours.     No Known Allergies  Social History   Social History  . Marital status: Married    Spouse name: N/A  . Number of children: N/A  . Years of education: N/A   Occupational History  . Not on file.   Social History Main Topics  . Smoking status: Former Smoker    Packs/day: 1.50    Years: 35.00    Types: Cigarettes    Quit date: 11/03/1995  . Smokeless tobacco: Never Used  . Alcohol use Yes     Comment: occasionally  . Drug use: No  .  Sexual activity: Not on file   Other Topics Concern  . Not on file   Social History Narrative  . No narrative on file     Review of Systems: General: negative for chills, fever, night sweats or weight changes.  Cardiovascular: negative for chest pain, dyspnea on exertion, edema, orthopnea, palpitations, paroxysmal nocturnal dyspnea or shortness of breath Dermatological: negative for rash Respiratory: negative for cough or wheezing Urologic: negative for hematuria Abdominal: negative for nausea, vomiting, diarrhea, bright red blood per rectum, melena, or hematemesis Neurologic: negative for visual changes,  syncope, or dizziness All other systems reviewed and are otherwise negative except as noted above.    Blood pressure 138/74, pulse 72, height 6\' 2"  (1.88 m), weight 161 lb (73 kg).  General appearance: alert and no distress Neck: no adenopathy, no carotid bruit, no JVD, supple, symmetrical, trachea midline and thyroid not enlarged, symmetric, no tenderness/mass/nodules Lungs: clear to auscultation bilaterally Heart: regular rate and rhythm, S1, S2 normal, no murmur, click, rub or gallop Extremities: extremities normal, atraumatic, no cyanosis or edema  EKG not performed today  ASSESSMENT AND PLAN:   Biventricular ICD (implantable cardioverter-defibrillator) in place History of Bi V ICD implantation by Dr. Lovena Le who follows this on a routine basis. This was placed because of incessant ventricular tachycardia.  Cardiomyopathy, ischemic History of ischemic cardiomyopathy on appropriate medications without symptoms of failure  CAD s/p CABG  History of CAD status post bypass grafting in 1997 with a LIMA to his LAD, vein to the ramus branch, circumflex, PDA and PLA sequentially by Dr. Pia Mau. Myoview performed 03/17/12 showed scar in the anterior wall and apex as well as septum with an EF of 42%.  Hyperlipidemia History of hyperlipidemia on statin therapy and Zetia followed by his PCP  Paroxysmal atrial fibrillation (HCC) History of paroxysmal atrial fibrillation on Pradaxa .      Lorretta Harp MD FACP,FACC,FAHA, [facility] 10/09/2016 4:04 PM

## 2016-10-09 NOTE — Assessment & Plan Note (Signed)
History of Bi V ICD implantation by Dr. Lovena Le who follows this on a routine basis. This was placed because of incessant ventricular tachycardia.

## 2016-10-09 NOTE — Assessment & Plan Note (Addendum)
History of ischemic cardiomyopathy on appropriate medications without symptoms of failure. TEE performed by Dr. Jacinta Shoe  October 2016 revealed normal LV function.

## 2016-10-09 NOTE — Patient Instructions (Signed)

## 2016-10-09 NOTE — Assessment & Plan Note (Signed)
History of hyperlipidemia on statin therapy and Zetia followed by his PCP

## 2016-10-09 NOTE — Assessment & Plan Note (Signed)
History of paroxysmal atrial fibrillation on Pradaxa .

## 2016-10-09 NOTE — Assessment & Plan Note (Signed)
History of CAD status post bypass grafting in 1997 with a LIMA to his LAD, vein to the ramus branch, circumflex, PDA and PLA sequentially by Dr. Pia Mau. Myoview performed 03/17/12 showed scar in the anterior wall and apex as well as septum with an EF of 42%.

## 2016-10-15 ENCOUNTER — Other Ambulatory Visit: Payer: Self-pay | Admitting: Cardiovascular Disease

## 2016-10-17 ENCOUNTER — Emergency Department (HOSPITAL_COMMUNITY): Payer: Medicare Other

## 2016-10-17 ENCOUNTER — Encounter (HOSPITAL_COMMUNITY): Payer: Self-pay | Admitting: Emergency Medicine

## 2016-10-17 ENCOUNTER — Inpatient Hospital Stay (HOSPITAL_COMMUNITY)
Admission: EM | Admit: 2016-10-17 | Discharge: 2016-10-22 | DRG: 371 | Disposition: A | Discharge status: Home / Self Care | Payer: Medicare Other | Attending: Internal Medicine | Admitting: Internal Medicine

## 2016-10-17 DIAGNOSIS — E119 Type 2 diabetes mellitus without complications: Secondary | ICD-10-CM

## 2016-10-17 DIAGNOSIS — I2510 Atherosclerotic heart disease of native coronary artery without angina pectoris: Secondary | ICD-10-CM | POA: Diagnosis not present

## 2016-10-17 DIAGNOSIS — R296 Repeated falls: Secondary | ICD-10-CM | POA: Diagnosis present

## 2016-10-17 DIAGNOSIS — I480 Paroxysmal atrial fibrillation: Secondary | ICD-10-CM | POA: Diagnosis present

## 2016-10-17 DIAGNOSIS — D696 Thrombocytopenia, unspecified: Secondary | ICD-10-CM | POA: Diagnosis not present

## 2016-10-17 DIAGNOSIS — Z950 Presence of cardiac pacemaker: Secondary | ICD-10-CM | POA: Diagnosis not present

## 2016-10-17 DIAGNOSIS — E785 Hyperlipidemia, unspecified: Secondary | ICD-10-CM | POA: Diagnosis present

## 2016-10-17 DIAGNOSIS — N183 Chronic kidney disease, stage 3 unspecified: Secondary | ICD-10-CM | POA: Diagnosis present

## 2016-10-17 DIAGNOSIS — I129 Hypertensive chronic kidney disease with stage 1 through stage 4 chronic kidney disease, or unspecified chronic kidney disease: Secondary | ICD-10-CM | POA: Diagnosis not present

## 2016-10-17 DIAGNOSIS — Z87891 Personal history of nicotine dependence: Secondary | ICD-10-CM | POA: Diagnosis not present

## 2016-10-17 DIAGNOSIS — R197 Diarrhea, unspecified: Secondary | ICD-10-CM

## 2016-10-17 DIAGNOSIS — Z86718 Personal history of other venous thrombosis and embolism: Secondary | ICD-10-CM

## 2016-10-17 DIAGNOSIS — I255 Ischemic cardiomyopathy: Secondary | ICD-10-CM | POA: Diagnosis not present

## 2016-10-17 DIAGNOSIS — I2589 Other forms of chronic ischemic heart disease: Secondary | ICD-10-CM | POA: Diagnosis not present

## 2016-10-17 DIAGNOSIS — K760 Fatty (change of) liver, not elsewhere classified: Secondary | ICD-10-CM | POA: Diagnosis not present

## 2016-10-17 DIAGNOSIS — Z7982 Long term (current) use of aspirin: Secondary | ICD-10-CM

## 2016-10-17 DIAGNOSIS — A021 Salmonella sepsis: Secondary | ICD-10-CM | POA: Diagnosis not present

## 2016-10-17 DIAGNOSIS — H9190 Unspecified hearing loss, unspecified ear: Secondary | ICD-10-CM | POA: Diagnosis not present

## 2016-10-17 DIAGNOSIS — A419 Sepsis, unspecified organism: Secondary | ICD-10-CM | POA: Diagnosis not present

## 2016-10-17 DIAGNOSIS — E1122 Type 2 diabetes mellitus with diabetic chronic kidney disease: Secondary | ICD-10-CM | POA: Diagnosis present

## 2016-10-17 DIAGNOSIS — D61818 Other pancytopenia: Secondary | ICD-10-CM | POA: Diagnosis not present

## 2016-10-17 DIAGNOSIS — G9341 Metabolic encephalopathy: Secondary | ICD-10-CM | POA: Diagnosis not present

## 2016-10-17 DIAGNOSIS — R404 Transient alteration of awareness: Secondary | ICD-10-CM | POA: Diagnosis not present

## 2016-10-17 DIAGNOSIS — Z95810 Presence of automatic (implantable) cardiac defibrillator: Secondary | ICD-10-CM | POA: Diagnosis present

## 2016-10-17 DIAGNOSIS — Z951 Presence of aortocoronary bypass graft: Secondary | ICD-10-CM

## 2016-10-17 DIAGNOSIS — R509 Fever, unspecified: Secondary | ICD-10-CM | POA: Diagnosis present

## 2016-10-17 DIAGNOSIS — Z7902 Long term (current) use of antithrombotics/antiplatelets: Secondary | ICD-10-CM

## 2016-10-17 DIAGNOSIS — R740 Nonspecific elevation of levels of transaminase and lactic acid dehydrogenase [LDH]: Secondary | ICD-10-CM

## 2016-10-17 DIAGNOSIS — A020 Salmonella enteritis: Principal | ICD-10-CM

## 2016-10-17 DIAGNOSIS — E118 Type 2 diabetes mellitus with unspecified complications: Secondary | ICD-10-CM | POA: Diagnosis not present

## 2016-10-17 DIAGNOSIS — R531 Weakness: Secondary | ICD-10-CM | POA: Diagnosis not present

## 2016-10-17 DIAGNOSIS — E1151 Type 2 diabetes mellitus with diabetic peripheral angiopathy without gangrene: Secondary | ICD-10-CM | POA: Diagnosis not present

## 2016-10-17 DIAGNOSIS — R7401 Elevation of levels of liver transaminase levels: Secondary | ICD-10-CM

## 2016-10-17 LAB — URINALYSIS, ROUTINE W REFLEX MICROSCOPIC
BILIRUBIN URINE: NEGATIVE
Glucose, UA: NEGATIVE mg/dL
Hgb urine dipstick: NEGATIVE
KETONES UR: NEGATIVE mg/dL
Leukocytes, UA: NEGATIVE
NITRITE: NEGATIVE
PROTEIN: NEGATIVE mg/dL
Specific Gravity, Urine: 1.017 (ref 1.005–1.030)
pH: 6 (ref 5.0–8.0)

## 2016-10-17 LAB — COMPREHENSIVE METABOLIC PANEL
ALK PHOS: 77 U/L (ref 38–126)
ALT: 67 U/L — AB (ref 17–63)
AST: 147 U/L — ABNORMAL HIGH (ref 15–41)
Albumin: 2.4 g/dL — ABNORMAL LOW (ref 3.5–5.0)
Anion gap: 4 — ABNORMAL LOW (ref 5–15)
BILIRUBIN TOTAL: 0.9 mg/dL (ref 0.3–1.2)
BUN: 14 mg/dL (ref 6–20)
CALCIUM: 7.8 mg/dL — AB (ref 8.9–10.3)
CO2: 27 mmol/L (ref 22–32)
CREATININE: 1.31 mg/dL — AB (ref 0.61–1.24)
Chloride: 107 mmol/L (ref 101–111)
GFR calc non Af Amer: 53 mL/min — ABNORMAL LOW (ref >60)
GLUCOSE: 104 mg/dL — AB (ref 65–99)
Potassium: 3.8 mmol/L (ref 3.5–5.1)
SODIUM: 138 mmol/L (ref 135–145)
TOTAL PROTEIN: 5.6 g/dL — AB (ref 6.5–8.1)

## 2016-10-17 LAB — CBC WITH DIFFERENTIAL/PLATELET
BASOS ABS: 0 10*3/uL (ref 0.0–0.1)
Basophils Relative: 0 %
EOS ABS: 0 10*3/uL (ref 0.0–0.7)
Eosinophils Relative: 0 %
HCT: 32.4 % — ABNORMAL LOW (ref 39.0–52.0)
Hemoglobin: 10.9 g/dL — ABNORMAL LOW (ref 13.0–17.0)
Lymphocytes Relative: 13 %
Lymphs Abs: 0.5 10*3/uL — ABNORMAL LOW (ref 0.7–4.0)
MCH: 34.8 pg — ABNORMAL HIGH (ref 26.0–34.0)
MCHC: 33.6 g/dL (ref 30.0–36.0)
MCV: 103.5 fL — ABNORMAL HIGH (ref 78.0–100.0)
MONO ABS: 0.1 10*3/uL (ref 0.1–1.0)
Monocytes Relative: 3 %
NEUTROS PCT: 84 %
Neutro Abs: 2.9 10*3/uL (ref 1.7–7.7)
Platelets: 114 10*3/uL — ABNORMAL LOW (ref 150–400)
RBC: 3.13 MIL/uL — ABNORMAL LOW (ref 4.22–5.81)
RDW: 15.7 % — AB (ref 11.5–15.5)
WBC: 3.5 10*3/uL — ABNORMAL LOW (ref 4.0–10.5)

## 2016-10-17 LAB — I-STAT CG4 LACTIC ACID, ED: Lactic Acid, Venous: 1.22 mmol/L (ref 0.5–1.9)

## 2016-10-17 LAB — CK: CK TOTAL: 156 U/L (ref 49–397)

## 2016-10-17 MED ORDER — VANCOMYCIN HCL IN DEXTROSE 750-5 MG/150ML-% IV SOLN
750.0000 mg | Freq: Two times a day (BID) | INTRAVENOUS | Status: DC
  Administered 2016-10-18 – 2016-10-20 (×5): 750 mg via INTRAVENOUS
  Filled 2016-10-17 (×10): qty 150

## 2016-10-17 MED ORDER — VANCOMYCIN HCL IN DEXTROSE 1-5 GM/200ML-% IV SOLN
1000.0000 mg | Freq: Once | INTRAVENOUS | Status: AC
  Administered 2016-10-17: 1000 mg via INTRAVENOUS
  Filled 2016-10-17: qty 200

## 2016-10-17 MED ORDER — ACETAMINOPHEN 325 MG PO TABS
650.0000 mg | ORAL_TABLET | Freq: Four times a day (QID) | ORAL | Status: DC | PRN
  Administered 2016-10-18: 650 mg via ORAL
  Filled 2016-10-17: qty 2

## 2016-10-17 MED ORDER — SODIUM CHLORIDE 0.9% FLUSH: 3.0000 mL | INTRAVENOUS | Status: DC | PRN

## 2016-10-17 MED ORDER — ACETAMINOPHEN 650 MG RE SUPP: 650.0000 mg | Freq: Four times a day (QID) | RECTAL | Status: DC | PRN

## 2016-10-17 MED ORDER — ONDANSETRON HCL 4 MG PO TABS: 4.0000 mg | ORAL_TABLET | Freq: Four times a day (QID) | ORAL | Status: DC | PRN

## 2016-10-17 MED ORDER — ACETAMINOPHEN 650 MG RE SUPP: 650.0000 mg | Freq: Once | RECTAL | Status: DC

## 2016-10-17 MED ORDER — SODIUM CHLORIDE 0.9% FLUSH
3.0000 mL | Freq: Two times a day (BID) | INTRAVENOUS | Status: DC
  Administered 2016-10-17 – 2016-10-22 (×9): 3 mL via INTRAVENOUS

## 2016-10-17 MED ORDER — SODIUM CHLORIDE 0.9 % IV BOLUS (SEPSIS)
1000.0000 mL | Freq: Once | INTRAVENOUS | Status: AC
  Administered 2016-10-17: 1000 mL via INTRAVENOUS

## 2016-10-17 MED ORDER — PIPERACILLIN-TAZOBACTAM 3.375 G IVPB 30 MIN
3.3750 g | Freq: Once | INTRAVENOUS | Status: AC
  Administered 2016-10-17: 3.375 g via INTRAVENOUS
  Filled 2016-10-17: qty 50

## 2016-10-17 MED ORDER — SODIUM CHLORIDE 0.9 % IV SOLN
250.0000 mL | INTRAVENOUS | Status: DC | PRN
  Administered 2016-10-20: 250 mL via INTRAVENOUS

## 2016-10-17 MED ORDER — SODIUM CHLORIDE 0.9 % IV BOLUS (SEPSIS)
250.0000 mL | Freq: Once | INTRAVENOUS | Status: AC
  Administered 2016-10-17: 250 mL via INTRAVENOUS

## 2016-10-17 MED ORDER — SODIUM CHLORIDE 0.9 % IV SOLN
INTRAVENOUS | Status: DC
  Administered 2016-10-17: 1000 mL via INTRAVENOUS

## 2016-10-17 MED ORDER — ONDANSETRON HCL 4 MG/2ML IJ SOLN: 4.0000 mg | Freq: Four times a day (QID) | INTRAMUSCULAR | Status: DC | PRN

## 2016-10-17 MED ORDER — PIPERACILLIN-TAZOBACTAM 3.375 G IVPB
3.3750 g | Freq: Three times a day (TID) | INTRAVENOUS | Status: DC
  Administered 2016-10-18 – 2016-10-20 (×8): 3.375 g via INTRAVENOUS
  Filled 2016-10-17 (×8): qty 50

## 2016-10-17 MED ORDER — ACETAMINOPHEN 650 MG RE SUPP
RECTAL | Status: AC
  Administered 2016-10-17: 19:00:00
  Filled 2016-10-17: qty 1

## 2016-10-17 NOTE — H&P (Signed)
History and Physical    Alan Orr:[id number] DOB: [birth year] DOA: 10/17/2016  PCP: [name], MD  Patient coming from: home  Chief Complaint:  Fever  HPI: Alan Orr is a 71 y.o. male with medical history significant of recent hospitalization about 6 weeks ago for tick borne illness improved with course of doxycycline, CAD, pacemaker, DVT on pradaxa, CABG lives with family and was brought in for fever.  He says he has been having diarrhea for one day.  Denies any nausea or vomiting.  Has been feeling weak.  Denies any abdominal pain.  Pt is not oriented to time/year but otherwise seems coherent.  Unknown if he has any h/o dementia (not in his chart).  He says he lives with his wife, however it was a son who sent him to ED.   Pt is covered in bruises from various stages, most old.  He says he falls a lot at home.  Pt denies any cough.  No urinary symptoms.  As I am interviewing him in room, he defecates on himself with a very loose stool.  Pt referred for admission for fever of 103 of unclear source.    Review of Systems: As per HPI otherwise 10 point review of systems negative.   Past Medical History:  Diagnosis Date  . CAD (coronary artery disease)   . DM (diabetes mellitus) ([place])    type 2   . DVT (deep venous thrombosis) ([place])   . Hyperlipidemia   . Hypertension   . ICD (implantable cardioverter-defibrillator), single, in situ   . Ischemic cardiomyopathy   . PAF (paroxysmal atrial fibrillation) ([place])   . RBBB   . S/P CABG x 5     Past Surgical History:  Procedure Laterality Date  . BACK SURGERY    . CARDIAC CATHETERIZATION  05/08/2009   small vessel disease  . CARDIAC DEFIBRILLATOR PLACEMENT  05/09/2009   [employer]  . CORONARY ARTERY BYPASS GRAFT  12/31/1995   LIMA to LAD,SVG to intermediate,SVG to CX,seq. svg to posterior descending and posterolateral RCA  . EP IMPLANTABLE DEVICE N/A 09/25/2015   Procedure: PPM/BIV PPM Generator Changeout;  Surgeon: Evans Lance, MD;  Location: [facility];  Service: Cardiovascular;  Laterality: N/A;  . I&D EXTREMITY Left 11/03/2013   Procedure: Left Leg Debride Ulcer, Apply Wound VAC and Theraskin;  Surgeon: Newt Minion, MD;  Location: [facility];  Service: Orthopedics;  Laterality: Left;  . TEE WITHOUT CARDIOVERSION N/A 01/11/2015   Procedure: TRANSESOPHAGEAL ECHOCARDIOGRAM (TEE);  Surgeon: Herminio Commons, MD;  [facility];  Service: Cardiology;  Laterality: N/A;     reports that he quit smoking about 20 years ago. His smoking use included Cigarettes. He has a 52.50 pack-year smoking history. He has never used smokeless tobacco. He reports that he drinks alcohol. He reports that he does not use drugs.  No Known Allergies  Family History  Problem Relation Age of Onset  . Heart attack Mother   . Stroke Father     Prior to Admission medications   Medication Sig Start Date End Date Taking? Authorizing Provider  ALPRAZolam Duanne Moron) 1 MG tablet Take 1 mg by mouth daily as needed for anxiety or sleep.     [provider]  amiodarone (PACERONE) 200 MG tablet Take 200 mg by mouth daily.    [provider]  aspirin EC 81 MG tablet Take 81 mg by mouth at bedtime.    [provider]  atorvastatin (LIPITOR) 40 MG tablet Take 40 mg by mouth daily.    [provider]  ezetimibe (ZETIA) 10 MG tablet TAKE 1 TABLET ONCE DAILY FOR CHOLESTEROL. 10/02/16   Lorretta Harp, MD  ferrous sulfate 325 (65 FE) MG tablet Take 325 mg by mouth daily with breakfast.    [provider]  magnesium oxide (MAG-OX) 400 MG tablet Take 400 mg by mouth daily.    [provider]  metoprolol (LOPRESSOR) 50 MG tablet TAKE 1 TABLET BY MOUTH TWICE DAILY. Patient taking differently: TAKE 1 TABLET BY MOUTH ONCE DAILY. 06/03/16   Evans Lance, MD  Multiple Vitamin (MULTI-VITAMINS) TABS Take 1 tablet by mouth daily.    [provider]  niacin (NIASPAN) 1000 MG CR  tablet TAKE (1) TABLET BY MOUTH AT BEDTIME. 10/15/16   Bayard Hugger, MD  nitroGLYCERIN (NITROSTAT) 0.4 MG SL tablet Place 0.4 mg under the tongue every 5 (five) minutes x 3 doses as needed for chest pain. 10/31/15   [provider]  oxyCODONE-acetaminophen (PERCOCET) 10-325 MG per tablet Take 1 tablet by mouth every 4 (four) hours as needed for pain.    [provider]  PRADAXA 75 MG CAPS capsule TAKE 1 CAPSULE BY MOUTH EVERY 12 HOURS. 09/24/16   Croitoru, Mihai, MD  ramipril (ALTACE) 2.5 MG capsule Take 2.5 mg by mouth at bedtime.     [provider]  SYNTHROID 137 MCG tablet Take 137 mcg by mouth daily before breakfast.  07/20/16   [provider]  zolpidem (AMBIEN) 10 MG tablet Take 10 mg by mouth at bedtime as needed for sleep.    [provider]    Physical Exam: Vitals:   10/17/16 1900 10/17/16 1930 10/17/16 1934 10/17/16 2000  BP: (!) 128/50 (!) 127/98  (!) 107/47  Pulse:  78  75  Resp: 20 12  18   Temp:   (!) 100.9 F (38.3 C)   TempSrc:   Oral   SpO2:  96%  93%  Weight:      Height:        Constitutional: NAD, calm, comfortable Vitals:   10/17/16 1900 10/17/16 1930 10/17/16 1934 10/17/16 2000  BP: (!) 128/50 (!) 127/98  (!) 107/47  Pulse:  78  75  Resp: 20 12  18   Temp:   (!) 100.9 F (38.3 C)   TempSrc:   Oral   SpO2:  96%  93%  Weight:      Height:       Eyes: PERRL, lids and conjunctivae normal ENMT: Mucous membranes are moist. Posterior pharynx clear of any exudate or lesions.Normal dentition.  Neck: normal, supple, no masses, no thyromegaly Respiratory: clear to auscultation bilaterally, no wheezing, no crackles. Normal respiratory effort. No accessory muscle use.  Cardiovascular: Regular rate and rhythm, no murmurs / rubs / gallops. No extremity edema. 2+ pedal pulses. No carotid bruits.  Abdomen: no tenderness, no masses palpated. No hepatosplenomegaly. Bowel sounds positive.  Musculoskeletal: no clubbing / cyanosis. No  joint deformity upper and lower extremities. Good ROM, no contractures. Normal muscle tone.  Skin: has old bruises at various spots and stages Neurologic: CN 2-12 grossly intact. Sensation intact, DTR normal. Strength 5/5 in all 4.  Psychiatric: Normal judgment and insight. Alert and oriented x 2. Normal mood.    Labs on Admission: I have personally reviewed following labs and imaging studies  CBC:  Recent Labs Lab 10/17/16 1825  WBC 3.5*  NEUTROABS 2.9  HGB 10.9*  HCT 32.4*  MCV 103.5*  PLT 174*   Basic Metabolic Panel:  Recent Labs Lab 10/17/16 1825  NA 138  K 3.8  CL 107  CO2 27  GLUCOSE 104*  BUN 14  CREATININE 1.31*  CALCIUM 7.8*   GFR: Estimated Creatinine Clearance: 53.4 mL/min (A) (by C-G formula based on SCr of 1.31 mg/dL (H)). Liver Function Tests:  Recent Labs Lab 10/17/16 1825  AST 147*  ALT 67*  ALKPHOS 77  BILITOT 0.9  PROT 5.6*  ALBUMIN 2.4*    Cardiac Enzymes:  Recent Labs Lab 10/17/16 1825  CKTOTAL 156    Urine analysis:    Component Value Date/Time   COLORURINE YELLOW 10/17/2016 1933   APPEARANCEUR CLEAR 10/17/2016 1933   LABSPEC 1.017 10/17/2016 1933   PHURINE 6.0 10/17/2016 1933   GLUCOSEU NEGATIVE 10/17/2016 1933   HGBUR NEGATIVE 10/17/2016 1933   BILIRUBINUR NEGATIVE 10/17/2016 1933   KETONESUR NEGATIVE 10/17/2016 1933   PROTEINUR NEGATIVE 10/17/2016 1933   UROBILINOGEN 0.2 01/05/2015 1245   NITRITE NEGATIVE 10/17/2016 1933   LEUKOCYTESUR NEGATIVE 10/17/2016 1933   Radiological Exams on Admission: Dg Chest Port 1 View  Result Date: 10/17/2016 CLINICAL DATA:  SEPSIS, Per EMS pt is weak and confused. Symptoms began last night. His nephew visited him last night and stated that he was acting right. Family called EMS today. Pt has also been having diarrhea and has stool on him EXAM: PORTABLE CHEST 1 VIEW COMPARISON:  08/22/2016 FINDINGS: Patient's left-sided transvenous pacemaker with leads to the right atrium, right  ventricle, and coronary sinus. The lungs are free of focal consolidations and pleural effusions. No pulmonary edema. IMPRESSION: No evidence for acute cardiopulmonary abnormality. Electronically Signed   By: Nolon Nations M.D.   On: 10/17/2016 18:48    Assessment/Plan 71 yo male comes in with fever, diarrhea with recent tick born illness beginning of June 2018 treated with doxycycline  Principal Problem:   Fever- doubt this is related to his tick borne illness since its 6 weeks out.  Still has elevated lfts and thrombocytepenia however.  ua is neg.  cxr is normal.  cdiff and stool cx is pending.  bp stable. Lactic acid level normal.  Ck procalcitonin level. Cover with vanc and zosyn until cx data back (blood) and stool studies resulted.  Given full 30cc/kg ivf bolus in ED.  No further fluids at this time.  apap for fever.    Active Problems:   Type 2 diabetes mellitus with stage 3 chronic kidney disease ([place])- noted, stable, appears diet controlled   PACEMAKER, PERMANENT- noted   Biventricular ICD (implantable cardioverter-defibrillator) in place- noted   Cardiomyopathy, ischemic- stable no evidence of chf flare   CAD s/p CABG - stable   Paroxysmal atrial fibrillation ([place])- rate controlled   H/o DVT - per his home med list on pradaxa   Med rec is pending at this time.     DVT prophylaxis: on pradaxa Code Status:  full Family Communication:  none Disposition Plan:  Per day team Consults called:  none Admission status:  observation   Shakiah Wester A MD [facility]  If 7PM-7AM, please contact night-coverage [email or web address] Password [email or web address]  10/17/2016, 8:58 PM

## 2016-10-17 NOTE — ED Notes (Signed)
Call Erline Hau for ride if discharged [phone].

## 2016-10-17 NOTE — ED Notes (Signed)
Report given to Ron RN.

## 2016-10-17 NOTE — ED Triage Notes (Signed)
Per EMS pt is weak and confused.  Symptoms began last night.  His nephew visited him last night and stated that he was acting right.  Family called EMS today.  Pt has also been having diarrhea and has stool on him at this time.  Pt was staying at a house upstairs and the home was extremely hot.  Pt is shivering at this time.

## 2016-10-17 NOTE — ED Provider Notes (Signed)
[facility] DEPT Provider Note   CSN: [id number] Arrival date & time: 10/17/16  1803     History   Chief Complaint Chief Complaint  Patient presents with  . Weakness    HPI Alan Orr is a 71 y.o. male.  Patient brought in by EMS. He was found in an upper room that was very hot. Family stated that patient started acting not normal last evening. Was noted to have chills today. Family had reported diarrhea. Patient arrives shivering. Rectal temp was 103. Patient initially upon arrival appear drowsy. No evidence of any diarrhea stool on him.      Past Medical History:  Diagnosis Date  . CAD (coronary artery disease)   . DM (diabetes mellitus) ([place])    type 2   . DVT (deep venous thrombosis) ([place])   . Hyperlipidemia   . Hypertension   . ICD (implantable cardioverter-defibrillator), single, in situ   . Ischemic cardiomyopathy   . PAF (paroxysmal atrial fibrillation) ([place])   . RBBB   . S/P CABG x 5     Patient Active Problem List   Diagnosis Date Noted  . Fever 10/17/2016  . AKI (acute kidney injury) ([place]) 08/23/2016  . Sepsis ([place]) 08/22/2016  . Thrombocytopenia ([place]) 08/22/2016  . Tick bite of right thigh 08/22/2016  . Positive blood culture 01/07/2015  . Gram-positive bacteremia 01/07/2015  . Bacteremia   . Abnormal serum protein electrophoresis 08/21/2014  . Leg wound, left 11/03/2013  . History of carotid artery disease 03/03/2013  . Cardiomyopathy, ischemic 11/08/2012  . CAD s/p CABG  11/08/2012  . Hyperlipidemia 11/08/2012  . Paroxysmal atrial fibrillation ([place]) 11/08/2012  . Ventricular tachycardia ([place]) 11/08/2012  . Type 2 diabetes mellitus with stage 3 chronic kidney disease ([place]) 08/20/2009  . PACEMAKER, PERMANENT 08/20/2009  . Biventricular ICD (implantable cardioverter-defibrillator) in place 08/20/2009    Past Surgical History:  Procedure Laterality Date  . BACK SURGERY    . CARDIAC CATHETERIZATION  05/08/2009   small vessel disease    . CARDIAC DEFIBRILLATOR PLACEMENT  05/09/2009   [employer]  . CORONARY ARTERY BYPASS GRAFT  12/31/1995   LIMA to LAD,SVG to intermediate,SVG to CX,seq. svg to posterior descending and posterolateral RCA  . EP IMPLANTABLE DEVICE N/A 09/25/2015   Procedure: PPM/BIV PPM Generator Changeout;  Surgeon: Evans Lance, MD;  Location: [facility];  Service: Cardiovascular;  Laterality: N/A;  . I&D EXTREMITY Left 11/03/2013   Procedure: Left Leg Debride Ulcer, Apply Wound VAC and Theraskin;  Surgeon: Newt Minion, MD;  Location: [facility];  Service: Orthopedics;  Laterality: Left;  . TEE WITHOUT CARDIOVERSION N/A 01/11/2015   Procedure: TRANSESOPHAGEAL ECHOCARDIOGRAM (TEE);  Surgeon: Herminio Commons, MD;  [facility];  Service: Cardiology;  Laterality: N/A;       Home Medications    Prior to Admission medications   Medication Sig Start Date End Date Taking? Authorizing Provider  ALPRAZolam Duanne Moron) 1 MG tablet Take 1 mg by mouth daily as needed for anxiety or sleep.     [provider]  amiodarone (PACERONE) 200 MG tablet Take 200 mg by mouth daily.    [provider]  aspirin EC 81 MG tablet Take 81 mg by mouth at bedtime.    [provider]  atorvastatin (LIPITOR) 40 MG tablet Take 40 mg by mouth daily.    [provider]  ezetimibe (ZETIA) 10 MG tablet TAKE 1 TABLET ONCE DAILY FOR CHOLESTEROL. 10/02/16   Quay Burow  J, MD  ferrous sulfate 325 (65 FE) MG tablet Take 325 mg by mouth daily with breakfast.    [provider]  magnesium oxide (MAG-OX) 400 MG tablet Take 400 mg by mouth daily.    [provider]  metoprolol (LOPRESSOR) 50 MG tablet TAKE 1 TABLET BY MOUTH TWICE DAILY. Patient taking differently: TAKE 1 TABLET BY MOUTH ONCE DAILY. 06/03/16   Evans Lance, MD  Multiple Vitamin (MULTI-VITAMINS) TABS Take 1 tablet by mouth daily.    [provider]  niacin (NIASPAN) 1000 MG CR tablet TAKE (1) TABLET BY  MOUTH AT BEDTIME. 10/15/16   Bayard Hugger, MD  nitroGLYCERIN (NITROSTAT) 0.4 MG SL tablet Place 0.4 mg under the tongue every 5 (five) minutes x 3 doses as needed for chest pain. 10/31/15   [provider]  oxyCODONE-acetaminophen (PERCOCET) 10-325 MG per tablet Take 1 tablet by mouth every 4 (four) hours as needed for pain.    [provider]  PRADAXA 75 MG CAPS capsule TAKE 1 CAPSULE BY MOUTH EVERY 12 HOURS. 09/24/16   Croitoru, Mihai, MD  ramipril (ALTACE) 2.5 MG capsule Take 2.5 mg by mouth at bedtime.     [provider]  SYNTHROID 137 MCG tablet Take 137 mcg by mouth daily before breakfast.  07/20/16   [provider]  zolpidem (AMBIEN) 10 MG tablet Take 10 mg by mouth at bedtime as needed for sleep.    [provider]    Family History Family History  Problem Relation Age of Onset  . Heart attack Mother   . Stroke Father     Social History Social History  Substance Use Topics  . Smoking status: Former Smoker    Packs/day: 1.50    Years: 35.00    Types: Cigarettes    Quit date: 11/03/1995  . Smokeless tobacco: Never Used  . Alcohol use Yes     Comment: occasionally     Allergies   Patient has no known allergies.   Review of Systems Review of Systems  Constitutional: Positive for chills.  HENT: Negative for congestion.   Eyes: Negative for redness.  Respiratory: Negative for shortness of breath.   Cardiovascular: Negative for chest pain.  Gastrointestinal: Positive for diarrhea. Negative for abdominal pain.  Genitourinary: Negative for dysuria.  Musculoskeletal: Negative for myalgias.  Skin: Negative for rash.  Neurological: Negative for headaches.  Hematological: Bruises/bleeds easily.  Psychiatric/Behavioral: Positive for confusion.     Physical Exam Updated Vital Signs BP (!) 107/47   Pulse 75   Temp (!) 100.9 F (38.3 C) (Oral)   Resp 18   Ht 1.88 m (6\' 2" )   Wt 73 kg (161 lb)   SpO2 93%   BMI 20.67 kg/m    Physical Exam  Constitutional: He appears well-developed and well-nourished. He appears distressed.  HENT:  Head: Normocephalic and atraumatic.  Mucous membranes slightly dry.  Eyes: Pupils are equal, round, and reactive to light. Conjunctivae and EOM are normal.  Neck: Normal range of motion.  Cardiovascular: Normal rate and regular rhythm.   Paced on monitor  Pulmonary/Chest: Effort normal and breath sounds normal. No respiratory distress.  Abdominal: Soft. Bowel sounds are normal. There is no tenderness.  Musculoskeletal: Normal range of motion. He exhibits no edema.  Neurological: He is alert. No cranial nerve deficit. He exhibits normal muscle tone. Coordination normal.  Skin: Skin is warm. No rash noted.  Multiple scattered bruises.  Nursing note and vitals reviewed.  ED Treatments / Results  Labs (all labs ordered are listed, but only abnormal results are displayed) Labs Reviewed  COMPREHENSIVE METABOLIC PANEL - Abnormal; Notable for the following:       Result Value   Glucose, Bld 104 (*)    Creatinine, Ser 1.31 (*)    Calcium 7.8 (*)    Total Protein 5.6 (*)    Albumin 2.4 (*)    AST 147 (*)    ALT 67 (*)    GFR calc non Af Amer 53 (*)    Anion gap 4 (*)    All other components within normal limits  CBC WITH DIFFERENTIAL/PLATELET - Abnormal; Notable for the following:    WBC 3.5 (*)    RBC 3.13 (*)    Hemoglobin 10.9 (*)    HCT 32.4 (*)    MCV 103.5 (*)    MCH 34.8 (*)    RDW 15.7 (*)    Platelets 114 (*)    Lymphs Abs 0.5 (*)    All other components within normal limits  GASTROINTESTINAL PANEL BY PCR, STOOL (REPLACES STOOL CULTURE)  CULTURE, BLOOD (ROUTINE X 2)  CULTURE, BLOOD (ROUTINE X 2)  URINE CULTURE  C DIFFICILE QUICK SCREEN W PCR REFLEX  URINALYSIS, ROUTINE W REFLEX MICROSCOPIC  CK  I-STAT CG4 LACTIC ACID, ED  I-STAT CG4 LACTIC ACID, ED    EKG  EKG Interpretation  Date/Time:  Saturday October 17 2016 18:06:40 EDT Ventricular Rate:   87 PR Interval:    QRS Duration: 134 QT Interval:  405 QTC Calculation: 488 R Axis:   -87 Text Interpretation:  Afib/flut and V-paced complexes No further analysis attempted due to paced rhythm Baseline wander in lead(s) II III aVF Confirmed by Fredia Sorrow [phone]) on 10/17/2016 6:54:08 PM       Radiology Dg Chest Port 1 View  Result Date: 10/17/2016 CLINICAL DATA:  SEPSIS, Per EMS pt is weak and confused. Symptoms began last night. His nephew visited him last night and stated that he was acting right. Family called EMS today. Pt has also been having diarrhea and has stool on him EXAM: PORTABLE CHEST 1 VIEW COMPARISON:  08/22/2016 FINDINGS: Patient's left-sided transvenous pacemaker with leads to the right atrium, right ventricle, and coronary sinus. The lungs are free of focal consolidations and pleural effusions. No pulmonary edema. IMPRESSION: No evidence for acute cardiopulmonary abnormality. Electronically Signed   By: Nolon Nations M.D.   On: 10/17/2016 18:48    Procedures Procedures (including critical care time)  CRITICAL CARE Performed by: Fredia Sorrow Total critical care time: 30 minutes Critical care time was exclusive of separately billable procedures and treating other patients. Critical care was necessary to treat or prevent imminent or life-threatening deterioration. Critical care was time spent personally by me on the following activities: development of treatment plan with patient and/or surrogate as well as nursing, discussions with consultants, evaluation of patient's response to treatment, examination of patient, obtaining history from patient or surrogate, ordering and performing treatments and interventions, ordering and review of laboratory studies, ordering and review of radiographic studies, pulse oximetry and re-evaluation of patient's condition.   Medications Ordered in ED Medications  acetaminophen (TYLENOL) suppository 650 mg (0 mg Rectal Hold 10/17/16  1855)  piperacillin-tazobactam (ZOSYN) IVPB 3.375 g (not administered)  vancomycin (VANCOCIN) IVPB 750 mg/150 ml premix (not administered)  0.9 %  sodium chloride infusion (1,000 mLs Intravenous New Bag/Given 10/17/16 2015)  sodium chloride 0.9 % bolus 1,000 mL (0 mLs Intravenous Stopped 10/17/16  1853)    And  sodium chloride 0.9 % bolus 1,000 mL (0 mLs Intravenous Stopped 10/17/16 1840)    And  sodium chloride 0.9 % bolus 250 mL (0 mLs Intravenous Stopped 10/17/16 1904)  acetaminophen (TYLENOL) 650 MG suppository (  Given 10/17/16 1840)  piperacillin-tazobactam (ZOSYN) IVPB 3.375 g (0 g Intravenous Stopped 10/17/16 1907)  vancomycin (VANCOCIN) IVPB 1000 mg/200 mL premix (0 mg Intravenous Stopped 10/17/16 2004)     Initial Impression / Assessment and Plan / ED Course  I have reviewed the triage vital signs and the nursing notes.  Pertinent labs & imaging results that were available during my care of the patient were reviewed by me and considered in my medical decision making (see chart for details).    Patient brought in by EMS. Reported to be weak and confused. Pre-and family symptoms began last night. Family member stated he was not acting quite right last night. Family also reported having diarrhea. He was staying in upstairs room in the house and was extremely hot. Patient was shivering. Upon arrival initial concern was maybe for sepsis. Rectal temp was 103 but also was not clear whether the elevated temperature was due to the hot environment. Patient would able to follow commands upon arrival. Improved quickly with fluids. Labs lactic acid not elevated white blood cell count slightly on the low side more suggest maybe a viral illness. Patient's heart rate is permanently paced. Patient has a history complete heart block.  The patient was treated on the sepsis orders and did receive initial dose of broad-spectrum antibiotics as Zosyn and vancomycin. However workup as mentioned on labs chest x-ray was  negative urinalysis negative no signs of infectious source. Not clear whether this was truly sepsis. Could be environmental overheating. Patient's CPK was normal too. Urine also was not significantly concentrated.  Review of records show the patient was admitted in June for sepsis he did have an elevated lactic acid that time they felt it was due to tickborne illness and he was treated with doxycycline. Had rapid improvement. He did have elevated liver function tests. They're still elevated today. No significant change.   Patient did receive the full of fluid challenge based on the sepsis orders. Even though he did not have a lot elevated lactic acid or hypotension. But it did seem to help him.  No diarrhea stools here. C. difficile stuff was sent. Patient's abdomen is soft nontender. Mentally patient seemed improved with the fluids followed all commands would answer questions. Denied any pain.  Patient's skin does have multiple old bruises suggestive of several falls. No evidence of any hip tenderness. Patient moves both legs very well.  Discussed with the hospitalist they will admit.   Final Clinical Impressions(s) / ED Diagnoses   Final diagnoses:  Fever, unspecified fever cause  Hyperthermia  Sepsis, due to unspecified organism [facility])    New Prescriptions New Prescriptions   No medications on file     Fredia Sorrow, MD 10/17/16 2049

## 2016-10-17 NOTE — Progress Notes (Signed)
Pharmacy Antibiotic Note  KIRIL HIPPE is a 71 y.o. male admitted on 10/17/2016 with sepsis.  Pharmacy has been consulted for vancomycin and zosyn dosing. Initial doses ordered in the ED  Plan: Vancomycin 750 IV every 12 hours.  Goal trough 15-20 mcg/mL. Zosyn 3.375g IV q8h (4 hour infusion).  Height: 6\' 2"  (188 cm) Weight: 161 lb (73 kg) IBW/kg (Calculated) : 82.2  Temp (24hrs), Avg:102 F (38.9 C), Min:101 F (38.3 C), Max:103 F (39.4 C)   Recent Labs Lab 10/17/16 1825 10/17/16 1835  WBC 3.5*  --   CREATININE 1.31*  --   LATICACIDVEN  --  1.22    Estimated Creatinine Clearance: 53.4 mL/min (A) (by C-G formula based on SCr of 1.31 mg/dL (H)).    No Known Allergies   Thank you for allowing pharmacy to be a part of this patient's care.  Beverlee Nims 10/17/2016 7:34 PM

## 2016-10-17 NOTE — ED Notes (Signed)
Pt has a large bruise on the right hip that is approximately 10 inches.

## 2016-10-18 ENCOUNTER — Inpatient Hospital Stay (HOSPITAL_COMMUNITY): Payer: Medicare Other

## 2016-10-18 DIAGNOSIS — E785 Hyperlipidemia, unspecified: Secondary | ICD-10-CM | POA: Diagnosis present

## 2016-10-18 DIAGNOSIS — A021 Salmonella sepsis: Secondary | ICD-10-CM | POA: Diagnosis not present

## 2016-10-18 DIAGNOSIS — Z951 Presence of aortocoronary bypass graft: Secondary | ICD-10-CM | POA: Diagnosis not present

## 2016-10-18 DIAGNOSIS — Z7902 Long term (current) use of antithrombotics/antiplatelets: Secondary | ICD-10-CM | POA: Diagnosis not present

## 2016-10-18 DIAGNOSIS — R740 Nonspecific elevation of levels of transaminase and lactic acid dehydrogenase [LDH]: Secondary | ICD-10-CM

## 2016-10-18 DIAGNOSIS — I2510 Atherosclerotic heart disease of native coronary artery without angina pectoris: Secondary | ICD-10-CM | POA: Diagnosis present

## 2016-10-18 DIAGNOSIS — K760 Fatty (change of) liver, not elsewhere classified: Secondary | ICD-10-CM | POA: Diagnosis present

## 2016-10-18 DIAGNOSIS — G9341 Metabolic encephalopathy: Secondary | ICD-10-CM | POA: Diagnosis present

## 2016-10-18 DIAGNOSIS — Z86718 Personal history of other venous thrombosis and embolism: Secondary | ICD-10-CM | POA: Diagnosis not present

## 2016-10-18 DIAGNOSIS — E1122 Type 2 diabetes mellitus with diabetic chronic kidney disease: Secondary | ICD-10-CM | POA: Diagnosis present

## 2016-10-18 DIAGNOSIS — R197 Diarrhea, unspecified: Secondary | ICD-10-CM

## 2016-10-18 DIAGNOSIS — R509 Fever, unspecified: Secondary | ICD-10-CM | POA: Diagnosis not present

## 2016-10-18 DIAGNOSIS — Z7982 Long term (current) use of aspirin: Secondary | ICD-10-CM | POA: Diagnosis not present

## 2016-10-18 DIAGNOSIS — I255 Ischemic cardiomyopathy: Secondary | ICD-10-CM | POA: Diagnosis not present

## 2016-10-18 DIAGNOSIS — H9190 Unspecified hearing loss, unspecified ear: Secondary | ICD-10-CM | POA: Diagnosis present

## 2016-10-18 DIAGNOSIS — A020 Salmonella enteritis: Secondary | ICD-10-CM | POA: Diagnosis not present

## 2016-10-18 DIAGNOSIS — I2589 Other forms of chronic ischemic heart disease: Secondary | ICD-10-CM | POA: Diagnosis not present

## 2016-10-18 DIAGNOSIS — D61818 Other pancytopenia: Secondary | ICD-10-CM

## 2016-10-18 DIAGNOSIS — Z87891 Personal history of nicotine dependence: Secondary | ICD-10-CM | POA: Diagnosis not present

## 2016-10-18 DIAGNOSIS — E118 Type 2 diabetes mellitus with unspecified complications: Secondary | ICD-10-CM | POA: Diagnosis not present

## 2016-10-18 DIAGNOSIS — E119 Type 2 diabetes mellitus without complications: Secondary | ICD-10-CM

## 2016-10-18 DIAGNOSIS — I129 Hypertensive chronic kidney disease with stage 1 through stage 4 chronic kidney disease, or unspecified chronic kidney disease: Secondary | ICD-10-CM | POA: Diagnosis present

## 2016-10-18 DIAGNOSIS — E1151 Type 2 diabetes mellitus with diabetic peripheral angiopathy without gangrene: Secondary | ICD-10-CM | POA: Diagnosis present

## 2016-10-18 DIAGNOSIS — D696 Thrombocytopenia, unspecified: Secondary | ICD-10-CM | POA: Diagnosis not present

## 2016-10-18 DIAGNOSIS — N183 Chronic kidney disease, stage 3 (moderate): Secondary | ICD-10-CM | POA: Diagnosis present

## 2016-10-18 DIAGNOSIS — R7401 Elevation of levels of liver transaminase levels: Secondary | ICD-10-CM

## 2016-10-18 DIAGNOSIS — R7989 Other specified abnormal findings of blood chemistry: Secondary | ICD-10-CM | POA: Diagnosis not present

## 2016-10-18 DIAGNOSIS — Z95810 Presence of automatic (implantable) cardiac defibrillator: Secondary | ICD-10-CM | POA: Diagnosis not present

## 2016-10-18 DIAGNOSIS — I480 Paroxysmal atrial fibrillation: Secondary | ICD-10-CM | POA: Diagnosis not present

## 2016-10-18 DIAGNOSIS — R296 Repeated falls: Secondary | ICD-10-CM | POA: Diagnosis present

## 2016-10-18 LAB — CBC
HCT: 31.2 % — ABNORMAL LOW (ref 39.0–52.0)
HEMOGLOBIN: 10.5 g/dL — AB (ref 13.0–17.0)
MCH: 34.5 pg — AB (ref 26.0–34.0)
MCHC: 33.7 g/dL (ref 30.0–36.0)
MCV: 102.6 fL — AB (ref 78.0–100.0)
Platelets: 100 10*3/uL — ABNORMAL LOW (ref 150–400)
RBC: 3.04 MIL/uL — AB (ref 4.22–5.81)
RDW: 15.7 % — ABNORMAL HIGH (ref 11.5–15.5)
WBC: 2.4 10*3/uL — ABNORMAL LOW (ref 4.0–10.5)

## 2016-10-18 LAB — BASIC METABOLIC PANEL
ANION GAP: 7 (ref 5–15)
BUN: 15 mg/dL (ref 6–20)
CALCIUM: 7.7 mg/dL — AB (ref 8.9–10.3)
CHLORIDE: 105 mmol/L (ref 101–111)
CO2: 24 mmol/L (ref 22–32)
Creatinine, Ser: 1.08 mg/dL (ref 0.61–1.24)
GFR calc Af Amer: >60 mL/min (ref >60)
GFR calc non Af Amer: >60 mL/min (ref >60)
GLUCOSE: 104 mg/dL — AB (ref 65–99)
Potassium: 3.5 mmol/L (ref 3.5–5.1)
Sodium: 136 mmol/L (ref 135–145)

## 2016-10-18 LAB — GLUCOSE, CAPILLARY
GLUCOSE-CAPILLARY: 121 mg/dL — AB (ref 65–99)
GLUCOSE-CAPILLARY: 114 mg/dL — AB (ref 65–99)

## 2016-10-18 LAB — PROCALCITONIN: PROCALCITONIN: 0.35 ng/mL

## 2016-10-18 LAB — SAVE SMEAR

## 2016-10-18 MED ORDER — ALPRAZOLAM 1 MG PO TABS: 1.0000 mg | ORAL_TABLET | Freq: Every day | ORAL | Status: DC | PRN

## 2016-10-18 MED ORDER — DOXYCYCLINE HYCLATE 100 MG PO TABS
100.0000 mg | ORAL_TABLET | Freq: Two times a day (BID) | ORAL | Status: DC
  Filled 2016-10-18: qty 1

## 2016-10-18 MED ORDER — AMIODARONE HCL 200 MG PO TABS
200.0000 mg | ORAL_TABLET | Freq: Every day | ORAL | Status: DC
  Administered 2016-10-18 – 2016-10-21 (×4): 200 mg via ORAL
  Filled 2016-10-18 (×5): qty 1

## 2016-10-18 MED ORDER — ZOLPIDEM TARTRATE 5 MG PO TABS
5.0000 mg | ORAL_TABLET | Freq: Every evening | ORAL | Status: DC | PRN
  Administered 2016-10-21 (×2): 5 mg via ORAL
  Filled 2016-10-18 (×2): qty 1

## 2016-10-18 MED ORDER — INSULIN ASPART 100 UNIT/ML ~~LOC~~ SOLN
0.0000 [IU] | Freq: Three times a day (TID) | SUBCUTANEOUS | Status: DC
  Administered 2016-10-18 – 2016-10-21 (×2): 1 [IU] via SUBCUTANEOUS

## 2016-10-18 MED ORDER — DOXYCYCLINE HYCLATE 100 MG IV SOLR
100.0000 mg | Freq: Two times a day (BID) | INTRAVENOUS | Status: DC
  Administered 2016-10-18 – 2016-10-22 (×9): 100 mg via INTRAVENOUS
  Filled 2016-10-18 (×11): qty 100

## 2016-10-18 MED ORDER — METOPROLOL TARTRATE 50 MG PO TABS
50.0000 mg | ORAL_TABLET | Freq: Two times a day (BID) | ORAL | Status: DC
  Administered 2016-10-18 – 2016-10-22 (×8): 50 mg via ORAL
  Filled 2016-10-18 (×9): qty 1

## 2016-10-18 MED ORDER — DABIGATRAN ETEXILATE MESYLATE 75 MG PO CAPS
75.0000 mg | ORAL_CAPSULE | Freq: Two times a day (BID) | ORAL | Status: DC
  Administered 2016-10-18 – 2016-10-22 (×8): 75 mg via ORAL
  Filled 2016-10-18 (×11): qty 1

## 2016-10-18 MED ORDER — LEVOTHYROXINE SODIUM 25 MCG PO TABS
137.0000 ug | ORAL_TABLET | Freq: Every day | ORAL | Status: DC
  Administered 2016-10-19 – 2016-10-22 (×4): 137 ug via ORAL
  Filled 2016-10-18 (×4): qty 1

## 2016-10-18 NOTE — Progress Notes (Signed)
PROGRESS NOTE  Alan Orr SKA:[id number] DOB: February 12, 1946 DOA: 10/17/2016 PCP: Alan School, MD  Brief Narrative: 70yom previous admission 08/2016 for fever, elevated LFTs, treated presumptively for tick-bourne illness, presented with fever, confusion, diarrhea. Lactic acid was normal. Admitted for further evaluation of fever, diarrhea, elevated LFTs.  Assessment/Plan Fever with diarrhea, elevated transaminases and pancytopenia. No recent travel or camping. No outside work since 08/2016. Lactic acid WNL, no evidence of sepsis. -etiology unclear. Cultures, cdiff, GI pathogen panel, hepatitis panel pending. No recent abx other than doxycycline 08/2016. -pattern suggests tick-bourne illness, but no recent tick exposure, no rash, no joint swelling -continue empiric abx, add doxycycline, further w/u as below.  Elevated transaminases -etiology unclear. No abdominal pain, no RUQ pain. Previously thought secondary to tick-bourne illness, but persistent since 08/2016 -f/u hepatitis panel -check RUQ u/s  Pancytopenia -suspect secondary to acute infection -follow daily CBC, check diff and smear tomorrow  Acute encephalopathy  -resolved. Secondary to acute illness.  Diarrhea  -f/u cdiff and GI pathogen panel  DM type 2 -stable. Add SSI.  PMH PAF, DVT -continue Pradaxa but watch plts  Ischemic cardiomyopathy with ICD/defibrillator in place,  -appears stable  Multiple falls at home -PT evaluation  DVT prophylaxis: Pradaxa Code Status: full Family Communication: none Disposition Plan: home    Alan Hodgkins, MD  [facility] Direct contact: [phone] --[place]ia [place]  --[email or web address]; password [email or web address]  7PM-7AM contact night coverage as above 10/18/2016, 9:14 AM  LOS: 0 days   Consultants:    Procedures:    Antimicrobials:  Zosyn 8/4 >>  Vancomycin 8/4 >>  Doxycycline 8/5 >>  Interval history/Subjective: Has chills, feels poorly. No abdominal pain. No  recent tick bites. stopped doing outside work after last hospitalization. Diarrhea continues (began abruptly yesterday). No recent abx. No n/v. Reports multiple falls at home. No joint swelling, no rash.  Objective: Vitals: Tmax 103.0, 100.3, 104, 164/84, 93% on RA  Exam:     Constitutional: appears ill, has rigors, not toxic, alert, calm  Eyes: Pupils, irises, lids appear unremarkable  ENT: grossly normal lips, tongue. Hard of hearing  Neck: no LAD, no masses, no thyromegaly  CV: tachycardic, regular rhythm, no m/r/g. No LE edema  Resp: CTA bilaterally, no w/r/r. Normal resp effort.  Abdomen: soft, ntnd, no RUQ pain, no hepatomegaly  Skin: scattered ecchymosis noted both legs, arms. No petechiae. No rash seen chest, abdomen, back, extremities.   Musculoskeletal. Moves all extremities to command.  Psych: grossly normal mood and affect, speech fluent and appropriate.   I have personally reviewed the following:   Labs:  BMP unremarkable  Admission ASt 147, AST 67 (improved since 08/2016). T bili and AP WNL  Lactic acid normal  Plts 114 >> 100 (better than 08/2016  WBC 2.4, Hgb 10.5  U/A negative  Imaging studies:  CXR independently reviewed, no acute disease  Medical tests:  Admission EKG paced rhythm  Test discussed with performing physician:    Decision to obtain old records:    Review and summation of old records:    Scheduled Meds: . amiodarone  200 mg Oral Daily  . dabigatran  75 mg Oral Q12H  . insulin aspart  0-9 Units Subcutaneous TID WC  . [START ON 10/19/2016] levothyroxine  137 mcg Oral QAC breakfast  . metoprolol tartrate  50 mg Oral BID  . sodium chloride flush  3 mL Intravenous Q12H   Continuous Infusions: . sodium chloride    . doxycycline (VIBRAMYCIN) IV    .  piperacillin-tazobactam (ZOSYN)  IV Stopped (10/18/16 7948)  . vancomycin Stopped (10/18/16 0165)    Principal Problem:   Fever Active Problems:   Type 2 diabetes  mellitus with stage 3 chronic kidney disease (HCC)   PACEMAKER, PERMANENT   Biventricular ICD (implantable cardioverter-defibrillator) in place   Cardiomyopathy, ischemic   Paroxysmal atrial fibrillation (HCC)   Thrombocytopenia (HCC)   Diarrhea   Elevated transaminase level   Pancytopenia ([place])   DM type 2 (diabetes mellitus, type 2) ([place])   LOS: 0 days

## 2016-10-19 ENCOUNTER — Encounter: Payer: Medicare Other | Admitting: *Deleted

## 2016-10-19 LAB — PROCALCITONIN: Procalcitonin: 2.06 ng/mL

## 2016-10-19 LAB — COMPREHENSIVE METABOLIC PANEL
ALBUMIN: 2.2 g/dL — AB (ref 3.5–5.0)
ALK PHOS: 64 U/L (ref 38–126)
ALT: 200 U/L — AB (ref 17–63)
AST: 396 U/L — ABNORMAL HIGH (ref 15–41)
Anion gap: 6 (ref 5–15)
BUN: 17 mg/dL (ref 6–20)
CALCIUM: 7.8 mg/dL — AB (ref 8.9–10.3)
CO2: 26 mmol/L (ref 22–32)
CREATININE: 1.1 mg/dL (ref 0.61–1.24)
Chloride: 102 mmol/L (ref 101–111)
GFR calc Af Amer: >60 mL/min (ref >60)
GFR calc non Af Amer: >60 mL/min (ref >60)
GLUCOSE: 67 mg/dL (ref 65–99)
Potassium: 3.3 mmol/L — ABNORMAL LOW (ref 3.5–5.1)
SODIUM: 134 mmol/L — AB (ref 135–145)
Total Bilirubin: 0.8 mg/dL (ref 0.3–1.2)
Total Protein: 5 g/dL — ABNORMAL LOW (ref 6.5–8.1)

## 2016-10-19 LAB — C DIFFICILE QUICK SCREEN W PCR REFLEX
C DIFFICILE (CDIFF) INTERP: NOT DETECTED
C DIFFICILE (CDIFF) TOXIN: NEGATIVE
C DIFFICLE (CDIFF) ANTIGEN: NEGATIVE

## 2016-10-19 LAB — CBC WITH DIFFERENTIAL/PLATELET
BASOS ABS: 0 10*3/uL (ref 0.0–0.1)
BASOS PCT: 0 %
EOS ABS: 0 10*3/uL (ref 0.0–0.7)
Eosinophils Relative: 1 %
HCT: 32.2 % — ABNORMAL LOW (ref 39.0–52.0)
HEMOGLOBIN: 10.9 g/dL — AB (ref 13.0–17.0)
Lymphocytes Relative: 36 %
Lymphs Abs: 1.2 10*3/uL (ref 0.7–4.0)
MCH: 34.3 pg — ABNORMAL HIGH (ref 26.0–34.0)
MCHC: 33.9 g/dL (ref 30.0–36.0)
MCV: 101.3 fL — ABNORMAL HIGH (ref 78.0–100.0)
Monocytes Absolute: 0.2 10*3/uL (ref 0.1–1.0)
Monocytes Relative: 6 %
NEUTROS PCT: 57 %
Neutro Abs: 1.9 10*3/uL (ref 1.7–7.7)
Platelets: 114 10*3/uL — ABNORMAL LOW (ref 150–400)
RBC: 3.18 MIL/uL — AB (ref 4.22–5.81)
RDW: 15.3 % (ref 11.5–15.5)
WBC: 3.4 10*3/uL — AB (ref 4.0–10.5)

## 2016-10-19 LAB — URINE CULTURE: CULTURE: NO GROWTH

## 2016-10-19 LAB — GLUCOSE, CAPILLARY
GLUCOSE-CAPILLARY: 76 mg/dL (ref 65–99)
GLUCOSE-CAPILLARY: 104 mg/dL — AB (ref 65–99)
Glucose-Capillary: 103 mg/dL — ABNORMAL HIGH (ref 65–99)
Glucose-Capillary: 87 mg/dL (ref 65–99)

## 2016-10-19 NOTE — Evaluation (Signed)
Physical Therapy Evaluation Patient Details Name: Alan Orr MRN: [id number] DOB: [birth year] Today's Date: 10/19/2016   History of Present Illness  71 yo male with onset of weakness and fall at home, after recent admission for tick illness.  Has severe GI symptoms, on enteric precautions.  Has fatty liver, ascites in recent imaging reports, dx of sepsis of no known source.  PMHx:  PACER, ICD, CAD, DVT PAF, DM, R BBB  Clinical Impression  Pt is able to walk with assistance, but is unsafe due to distractibility and his hurry over being incontinent of bowels.  He is requested to go to SNF due to his weakness and safety awareness and if declined will need 24/7 help to succeed and avoid a serious fall.  Follow acutely for the weakness and balance changes that have come from his sepsis and will hopefully get him home faster afterward.    Follow Up Recommendations SNF    Equipment Recommendations  None recommended by PT    Recommendations for Other Services       Precautions / Restrictions Precautions Precautions: Fall Restrictions Weight Bearing Restrictions: No      Mobility  Bed Mobility Overal bed mobility: Needs Assistance Bed Mobility: Supine to Sit     Supine to sit: Min guard;Min assist        Transfers Overall transfer level: Needs assistance Equipment used: Rolling walker (2 wheeled) Transfers: Sit to/from Stand Sit to Stand: Min guard         General transfer comment: safety assistance on RW, pt unaware of furniture and objects that will trip him  Ambulation/Gait Ambulation/Gait assistance: Min assist;Min guard Ambulation Distance (Feet): 30 Feet Assistive device: Rolling walker (2 wheeled);1 person hand held assist Gait Pattern/deviations: Step-through pattern;Step-to pattern;Decreased stride length;Wide base of support;Drifts right/left;Trunk flexed Gait velocity: reduced Gait velocity interpretation: Below normal speed for age/gender General Gait  Details: tends to push walker away from hmself, not safe unless attended 100% of the time  Stairs            Wheelchair Mobility    Modified Rankin (Stroke Patients Only)       Balance Overall balance assessment: History of Falls;Needs assistance Sitting-balance support: Feet supported Sitting balance-Leahy Scale: Fair     Standing balance support: Bilateral upper extremity supported Standing balance-Leahy Scale: Fair Standing balance comment: less than fair dynamic balance assessment                             Pertinent Vitals/Pain Pain Assessment: No/denies pain    Home Living Family/patient expects to be discharged to:: Private residence Living Arrangements: Other relatives (has moved to sister's after he was "put out" by wife) Available Help at Discharge: Family;Available 24 hours/day Type of Home: House       Home Layout: One level Home Equipment: [place] - 2 wheels;Cane - quad Additional Comments: pt only able to give some details of home    Prior Function Level of Independence: Independent with assistive device(s)               Hand Dominance        Extremity/Trunk Assessment   Upper Extremity Assessment Upper Extremity Assessment: Defer to OT evaluation    Lower Extremity Assessment Lower Extremity Assessment: Generalized weakness    Cervical / Trunk Assessment Cervical / Trunk Assessment: Kyphotic  Communication   Communication: HOH  Cognition Arousal/Alertness: Awake/alert Behavior During Therapy: Anxious Overall Cognitive Status: No  family/caregiver present to determine baseline cognitive functioning                                 General Comments: pt requires many recues for safety to just walk on RW      General Comments      Exercises     Assessment/Plan    PT Assessment Patient needs continued PT services  PT Problem List Decreased strength;Decreased range of motion;Decreased activity  tolerance;Decreased balance;Decreased mobility;Decreased coordination;Decreased cognition;Decreased knowledge of use of DME;Decreased safety awareness;Cardiopulmonary status limiting activity;Decreased skin integrity       PT Treatment Interventions DME instruction;Gait training;Functional mobility training;Therapeutic activities;Therapeutic exercise;Balance training;Neuromuscular re-education;Cognitive remediation;Patient/family education    PT Goals (Current goals can be found in the Care Plan section)  Acute Rehab PT Goals Patient Stated Goal: to get stronger and get home  PT Goal Formulation: With patient Time For Goal Achievement: 11/02/16 Potential to Achieve Goals: Good    Frequency Min 2X/week   Barriers to discharge Decreased caregiver support (Moved to sister's house) Need to get OK that he can go back to sister's    Co-evaluation               AM-PAC PT "6 Clicks" Daily Activity  Outcome Measure Difficulty turning over in bed (including adjusting bedclothes, sheets and blankets)?: Total Difficulty moving from lying on back to sitting on the side of the bed? : Total Difficulty sitting down on and standing up from a chair with arms (e.g., wheelchair, bedside commode, etc,.)?: Total Help needed moving to and from a bed to chair (including a wheelchair)?: A Little Help needed walking in hospital room?: A Little Help needed climbing 3-5 steps with a railing? : A Lot 6 Click Score: 11    End of Session Equipment Utilized During Treatment: Gait belt Activity Tolerance: Patient limited by fatigue;Treatment limited secondary to medical complications (Comment) (frequent episodes of fecal incontinence) Patient left: in chair;with call bell/phone within reach;with chair alarm set Nurse Communication: Mobility status;Other (comment) (discussed safety precautions in place) PT Visit Diagnosis: Unsteadiness on feet (R26.81);Muscle weakness (generalized) (M62.81);History of  falling (Z91.81);Difficulty in walking, not elsewhere classified (R26.2)    Time: 1610-9604 PT Time Calculation (min) (ACUTE ONLY): 43 min   Charges:   PT Evaluation $PT Eval Moderate Complexity: 1 Mod PT Treatments $Gait Training: 8-22 mins $Therapeutic Activity: 8-22 mins   PT G Codes:   PT G-Codes **NOT FOR INPATIENT CLASS** Functional Assessment Tool Used: AM-PAC 6 Clicks Basic Mobility    Ramond Dial 10/19/2016, 5:39 PM   5:41 PM, 10/19/16 Mee Hives, PT, MS Physical Therapist - [place] [phone] (Office)

## 2016-10-19 NOTE — Clinical Social Work Note (Signed)
Clinical Social Work Assessment  Patient Details  Name: Alan Orr MRN: [id number] Date of Birth: [birth year]  Date of referral:  10/19/16               Reason for consult:  Discharge Planning                Permission sought to share information with:    Permission granted to share information::     Name::        Agency::     Relationship::     Contact Information:  [name]  Housing/Transportation Living arrangements for the past 2 months:  Mobile Home Source of Information:  Patient, Other (Comment Required) (Sister) Patient Interpreter Needed:  None Criminal Activity/Legal Involvement Pertinent to Current Situation/Hospitalization:  No - Comment as needed Significant Relationships:  Siblings Lives with:  Siblings Do you feel safe going back to the place where you live?  Yes Need for family participation in patient care:  Yes (Comment)  Care giving concerns:  None identified at baseline.     Social Worker assessment / plan:  Patient's sister, Alan Orr, advised that patient came to her home last Friday night and state that his wife had kicked him out of the home and taken a warrant out on him. She stated that the warrant was taken out because patient "cursed" his wire. She stated that patient and his had conflict with his wife due to patient's wife wanting to move her son in with them from [place]. And the son has a substance abuse issue.  She state that patient became ill while staying with her and that he is unable to provide ongoing are for patient. She reports that she is the full time care giver for her husband who had a stroke and is paralyzed on his left side. She stated that additionally, the available bedroom at her home is up 13 stairs which are difficult for patient. [name] stated that he felt that patient would need rehab.    At baseline, patient uses a four prong cane, is independent in ADLs and drives.  Patient confirmed his sister's  statements. He is agreeable to short term rehab and reports that he wants to stay in the [place] area.    Employment status:  Retired Nurse, adult PT Recommendations:  [place] / Referral to community resources:  [place]  Patient/Family's Response to care:  Patient is agreeable to SNF.  Patient/Family's Understanding of and Emotional Response to Diagnosis, Current Treatment, and Prognosis:  Patient understands his diagnosis, treatment and prognosis. Patient is overwhelmed by the current situation with his wife and is hurt by all that has happened.     Emotional Assessment Appearance:  Appears stated age Attitude/Demeanor/Rapport:    Affect (typically observed):  Accepting, Calm Orientation:  Oriented to Situation, Oriented to  Time, Oriented to Place, Oriented to Self Alcohol / Substance use:  Not Applicable Psych involvement (Current and /or in the community):  No (Comment)  Discharge Needs  Concerns to be addressed:  Discharge Planning Concerns Readmission within the last 30 days:  No Current discharge risk:  Other Barriers to Discharge:  No Barriers Identified   Ihor Gully, LCSW 10/19/2016, 4:03 PM

## 2016-10-19 NOTE — Progress Notes (Signed)
  PROGRESS NOTE  KEYLIN PODOLSKY HWE:[id number] DOB: 25-Oct-1945 DOA: 10/17/2016 PCP: [name], MD  Brief Narrative: 70yom previous admission 08/2016 for fever, elevated LFTs, treated presumptively for tick-bourne illness, presented with fever, confusion, diarrhea. Lactic acid was normal. Admitted for further evaluation of fever, diarrhea, elevated LFTs.  Assessment/Plan Fever with diarrhea, elevated transaminases and pancytopenia. No recent travel or camping. No outside work since 08/2016. Lactic acid WNL, no evidence of sepsis. -Afebrile now 24 hours. Etiology unclear. He is responding to treatment though with improving pancytopenia is now afebrile. -Continue current treatment.  Elevated transaminases. Right upper quadrant ultrasound unremarkable except for hepatic steatosis. -Worse today. Etiology remains unclear. No abdominal pain or right upper quadrant pain. Persistent since June 2018, at which time they were thought related to tick borne illness. -Follow up hepatitis panel.  Pancytopenia -Suspect secondary to acute infection. Overall improving. Differential unremarkable. -CBC in a.m.  Diarrhea  -Improving. GI pathogen panel pending. C. difficile negative.  DM type 2 -Remains stable. Continue sliding scale insulin.  PMH PAF, DVT -Continue Pradaxa. Platelet count improving.  Ischemic cardiomyopathy with ICD/defibrillator in place,  -Remains asymptomatic  Multiple falls at home -PT has recommended skilled nursing facility for rehabilitation  Acute encephalopathy  -Resolved. Secondary to acute illness.  DVT prophylaxis: Pradaxa Code Status: full Family Communication: none Disposition Plan: Skilled nursing facility    [name], MD  [facility] Direct contact: [phone] --Via amion app [place]  --[email or web address]; password [email or web address]  7PM-7AM contact night coverage as above 10/19/2016, 5:46 PM  LOS: 1 day    Consultants:    Procedures:    Antimicrobials:  Zosyn 8/4 >>  Vancomycin 8/4 >>  Doxycycline 8/5 >>  Interval history/Subjective:   Objective: Vitals:   Exam:        I have personally reviewed the following:   Labs:  Blood sugars stable  Potassium 3.3, remainder BMP unremarkable  AST 396, ALT 200, both elevated.  Procalcitonin 2.06  WBC has improved, 3.4. Hemoglobin stable 10.9. Platelets improved, 114.  C. difficile screen negative  Imaging studies:    Medical tests:    Test discussed with performing physician:    Decision to obtain old records:    Review and summation of old records:    Scheduled Meds: . amiodarone  200 mg Oral Daily  . dabigatran  75 mg Oral Q12H  . insulin aspart  0-9 Units Subcutaneous TID WC  . levothyroxine  137 mcg Oral QAC breakfast  . metoprolol tartrate  50 mg Oral BID  . sodium chloride flush  3 mL Intravenous Q12H   Continuous Infusions: . sodium chloride    . doxycycline (VIBRAMYCIN) IV Stopped (10/19/16 1152)  . piperacillin-tazobactam (ZOSYN)  IV Stopped (10/19/16 1614)  . vancomycin 750 mg (10/19/16 1732)    Principal Problem:   Fever Active Problems:   Type 2 diabetes mellitus with stage 3 chronic kidney disease (HCC)   PACEMAKER, PERMANENT   Biventricular ICD (implantable cardioverter-defibrillator) in place   Cardiomyopathy, ischemic   Paroxysmal atrial fibrillation (HCC)   Thrombocytopenia (HCC)   Diarrhea   Elevated transaminase level   Pancytopenia ([place])   DM type 2 (diabetes mellitus, type 2) ([place])   LOS: 1 day

## 2016-10-19 NOTE — NC FL2 (Signed)
[place] MEDICAID FL2 LEVEL OF CARE SCREENING TOOL     IDENTIFICATION  Patient Name: Alan Orr Birthdate: 1945-03-26 Sex: male Admission Date (Current Location): 10/17/2016  [facility] and [place] Number:  [employer] and Address:  [facility] [address], [place]      Provider Number: [id number]  Attending Physician Name and Address:  Samuella Cota, MD  Relative Name and Phone Number:       Current Level of Care: Hospital Recommended Level of Care: [place] Prior Approval Number:    Date Approved/Denied:   PASRR Number: [id number] ([id number] )  Discharge Plan: SNF    Current Diagnoses: Patient Active Problem List   Diagnosis Date Noted  . Diarrhea 10/18/2016  . Elevated transaminase level 10/18/2016  . Pancytopenia ([place]) 10/18/2016  . DM type 2 (diabetes mellitus, type 2) ([place]) 10/18/2016  . Fever 10/17/2016  . Thrombocytopenia ([place]) 08/22/2016  . Tick bite of right thigh 08/22/2016  . Abnormal serum protein electrophoresis 08/21/2014  . Leg wound, left 11/03/2013  . History of carotid artery disease 03/03/2013  . Cardiomyopathy, ischemic 11/08/2012  . CAD s/p CABG  11/08/2012  . Hyperlipidemia 11/08/2012  . Paroxysmal atrial fibrillation ([place]) 11/08/2012  . Ventricular tachycardia ([place]) 11/08/2012  . Type 2 diabetes mellitus with stage 3 chronic kidney disease ([place]) 08/20/2009  . PACEMAKER, PERMANENT 08/20/2009  . Biventricular ICD (implantable cardioverter-defibrillator) in place 08/20/2009    Orientation RESPIRATION BLADDER Height & Weight     Self, Time, Situation, Place  Normal Continent Weight: 161 lb 7 oz (73.2 kg) Height:  5\' 11"  (180.3 cm)  BEHAVIORAL SYMPTOMS/MOOD NEUROLOGICAL BOWEL NUTRITION STATUS      Incontinent Diet (Heart)  AMBULATORY STATUS COMMUNICATION OF NEEDS Skin   Limited Assist Verbally Normal                       Personal Care Assistance Level of  Assistance  Feeding, Dressing, Bathing Bathing Assistance: Limited assistance Feeding assistance: Independent Dressing Assistance: Limited assistance     Functional Limitations Info  Sight, Hearing, Speech Sight Info: Adequate Hearing Info: Adequate Speech Info: Adequate    SPECIAL CARE FACTORS FREQUENCY  PT (By licensed PT)     PT Frequency: 5x/week              Contractures Contractures Info: Not present    Additional Factors Info  Psychotropic     Psychotropic Info: Xanax         Current Medications (10/19/2016):  This is the current hospital active medication list Current Facility-Administered Medications  Medication Dose Route Frequency Provider Last Rate Last Dose  . 0.9 %  sodium chloride infusion  250 mL Intravenous PRN Phillips Grout, MD      . acetaminophen (TYLENOL) tablet 650 mg  650 mg Oral Q6H PRN Phillips Grout, MD   650 mg at 10/18/16 0012   Or  . acetaminophen (TYLENOL) suppository 650 mg  650 mg Rectal Q6H PRN Phillips Grout, MD      . ALPRAZolam Duanne Moron) tablet 1 mg  1 mg Oral Daily PRN Samuella Cota, MD      . amiodarone (PACERONE) tablet 200 mg  200 mg Oral Daily Samuella Cota, MD   200 mg at 10/19/16 1214  . dabigatran (PRADAXA) capsule 75 mg  75 mg Oral Q12H Samuella Cota, MD   75 mg at 10/18/16 2134  . doxycycline (VIBRAMYCIN) 100  mg in dextrose 5 % 250 mL IVPB  100 mg Intravenous Q12H Samuella Cota, MD   Stopped at 10/19/16 1152  . insulin aspart (novoLOG) injection 0-9 Units  0-9 Units Subcutaneous TID WC Samuella Cota, MD   1 Units at 10/18/16 1135  . levothyroxine (SYNTHROID, LEVOTHROID) tablet 137 mcg  137 mcg Oral QAC breakfast Samuella Cota, MD   137 mcg at 10/19/16 0946  . metoprolol tartrate (LOPRESSOR) tablet 50 mg  50 mg Oral BID Samuella Cota, MD   50 mg at 10/18/16 2134  . ondansetron (ZOFRAN) tablet 4 mg  4 mg Oral Q6H PRN Phillips Grout, MD       Or  . ondansetron (ZOFRAN) injection 4 mg  4 mg  Intravenous Q6H PRN Derrill Kay A, MD      . piperacillin-tazobactam (ZOSYN) IVPB 3.375 g  3.375 g Intravenous Q8H Fredia Sorrow, MD   Stopped at 10/19/16 1614  . sodium chloride flush (NS) 0.9 % injection 3 mL  3 mL Intravenous Q12H Derrill Kay A, MD   3 mL at 10/18/16 2135  . sodium chloride flush (NS) 0.9 % injection 3 mL  3 mL Intravenous PRN Phillips Grout, MD      . vancomycin (VANCOCIN) IVPB 750 mg/150 ml premix  750 mg Intravenous Q12H Fredia Sorrow, MD   Stopped at 10/19/16 0732  . zolpidem (AMBIEN) tablet 5 mg  5 mg Oral QHS PRN Samuella Cota, MD         Discharge Medications: Please see discharge summary for a list of discharge medications.  Relevant Imaging Results:  Relevant Lab Results:   Additional Information SSN [id number], Clydene Pugh, LCSW

## 2016-10-19 NOTE — Care Management Note (Signed)
Case Management Note  Patient Details  Name: Alan Orr MRN: [id number] Date of Birth: 1945-08-29  Subjective/Objective:                  Admitted with fever. Pt from home, lives with wife but currently staying with sister. Pt ind with ADL's at baseline. No HH or DME needs pta. PT has recommended SNF. CSW aware.   Action/Plan: Plan for DC to SNF. CSW to make placement arrangements. CM will cont to follow until DC in case DC plan changes.   Expected Discharge Date:    10/20/2016              Expected Discharge Plan:  [place]  In-House Referral:  Clinical Social Work  Discharge planning Services  CM Consult  Post Acute Care Choice:  NA Choice offered to:  NA  Status of Service:  Completed, signed off  Sherald Barge, RN 10/19/2016, 3:38 PM

## 2016-10-19 NOTE — Clinical Social Work Placement (Signed)
   CLINICAL SOCIAL WORK PLACEMENT  NOTE  Date:  10/19/2016  Patient Details  Name: Alan Orr MRN: [id number] Date of Birth: [birth year]  Clinical Social Work is seeking post-discharge placement for this patient at the [place] level of care (*CSW will initial, date and re-position this form in  chart as items are completed):  Yes   Patient/family provided with [facility] Work Department's list of facilities offering this level of care within the geographic area requested by the patient (or if unable, by the patient's family).  Yes   Patient/family informed of their freedom to choose among providers that offer the needed level of care, that participate in Medicare, Medicaid or managed care program needed by the patient, have an available bed and are willing to accept the patient.  Yes   Patient/family informed of [place]'s ownership interest in [facility] and [facility], as well as of the fact that they are under no obligation to receive care at these facilities.  PASRR submitted to EDS on 10/19/16     PASRR number received on 10/19/16     Existing PASRR number confirmed on       FL2 transmitted to all facilities in geographic area requested by pt/family on 10/19/16     FL2 transmitted to all facilities within larger geographic area on       Patient informed that his/her managed care company has contracts with or will negotiate with certain facilities, including the following:            Patient/family informed of bed offers received.  Patient chooses bed at       Physician recommends and patient chooses bed at      Patient to be transferred to   on  .  Patient to be transferred to facility by       Patient family notified on   of transfer.  Name of family member notified:        PHYSICIAN       Additional Comment:    _______________________________________________ Ihor Gully, LCSW 10/19/2016, 4:22 PM

## 2016-10-20 DIAGNOSIS — E1122 Type 2 diabetes mellitus with diabetic chronic kidney disease: Secondary | ICD-10-CM

## 2016-10-20 DIAGNOSIS — A020 Salmonella enteritis: Secondary | ICD-10-CM

## 2016-10-20 DIAGNOSIS — N183 Chronic kidney disease, stage 3 (moderate): Secondary | ICD-10-CM

## 2016-10-20 LAB — COMPREHENSIVE METABOLIC PANEL
ALK PHOS: 65 U/L (ref 38–126)
ALT: 160 U/L — ABNORMAL HIGH (ref 17–63)
AST: 265 U/L — AB (ref 15–41)
Albumin: 2.1 g/dL — ABNORMAL LOW (ref 3.5–5.0)
Anion gap: 6 (ref 5–15)
BILIRUBIN TOTAL: 1.1 mg/dL (ref 0.3–1.2)
BUN: 16 mg/dL (ref 6–20)
CALCIUM: 7.9 mg/dL — AB (ref 8.9–10.3)
CO2: 26 mmol/L (ref 22–32)
CREATININE: 1.05 mg/dL (ref 0.61–1.24)
Chloride: 104 mmol/L (ref 101–111)
GFR calc Af Amer: >60 mL/min (ref >60)
Glucose, Bld: 97 mg/dL (ref 65–99)
POTASSIUM: 3 mmol/L — AB (ref 3.5–5.1)
Sodium: 136 mmol/L (ref 135–145)
TOTAL PROTEIN: 4.9 g/dL — AB (ref 6.5–8.1)

## 2016-10-20 LAB — CBC
HEMATOCRIT: 30.7 % — AB (ref 39.0–52.0)
Hemoglobin: 10.5 g/dL — ABNORMAL LOW (ref 13.0–17.0)
MCH: 34.1 pg — ABNORMAL HIGH (ref 26.0–34.0)
MCHC: 34.2 g/dL (ref 30.0–36.0)
MCV: 99.7 fL (ref 78.0–100.0)
PLATELETS: 125 10*3/uL — AB (ref 150–400)
RBC: 3.08 MIL/uL — ABNORMAL LOW (ref 4.22–5.81)
RDW: 15.2 % (ref 11.5–15.5)
WBC: 3.5 10*3/uL — AB (ref 4.0–10.5)

## 2016-10-20 LAB — GASTROINTESTINAL PANEL BY PCR, STOOL (REPLACES STOOL CULTURE)
ADENOVIRUS F40/41: NOT DETECTED
Astrovirus: NOT DETECTED
CAMPYLOBACTER SPECIES: NOT DETECTED
CRYPTOSPORIDIUM: NOT DETECTED
CYCLOSPORA CAYETANENSIS: NOT DETECTED
ENTEROPATHOGENIC E COLI (EPEC): NOT DETECTED
ENTEROTOXIGENIC E COLI (ETEC): NOT DETECTED
Entamoeba histolytica: NOT DETECTED
Enteroaggregative E coli (EAEC): NOT DETECTED
Giardia lamblia: NOT DETECTED
Norovirus GI/GII: NOT DETECTED
PLESIMONAS SHIGELLOIDES: NOT DETECTED
ROTAVIRUS A: NOT DETECTED
SAPOVIRUS (I, II, IV, AND V): NOT DETECTED
SHIGA LIKE TOXIN PRODUCING E COLI (STEC): NOT DETECTED
Salmonella species: DETECTED — AB
Shigella/Enteroinvasive E coli (EIEC): NOT DETECTED
VIBRIO SPECIES: NOT DETECTED
Vibrio cholerae: NOT DETECTED
YERSINIA ENTEROCOLITICA: NOT DETECTED

## 2016-10-20 LAB — GLUCOSE, CAPILLARY
GLUCOSE-CAPILLARY: 73 mg/dL (ref 65–99)
GLUCOSE-CAPILLARY: 118 mg/dL — AB (ref 65–99)
GLUCOSE-CAPILLARY: 114 mg/dL — AB (ref 65–99)
Glucose-Capillary: 126 mg/dL — ABNORMAL HIGH (ref 65–99)

## 2016-10-20 MED ORDER — CIPROFLOXACIN HCL 250 MG PO TABS
500.0000 mg | ORAL_TABLET | Freq: Two times a day (BID) | ORAL | Status: DC
  Administered 2016-10-20 – 2016-10-22 (×4): 500 mg via ORAL
  Filled 2016-10-20 (×4): qty 2

## 2016-10-20 MED ORDER — ALPRAZOLAM 1 MG PO TABS: 1.0000 mg | ORAL_TABLET | Freq: Every day | ORAL | Status: DC | PRN

## 2016-10-20 NOTE — Progress Notes (Signed)
  PROGRESS NOTE  Alan Orr MCN:[id number] DOB: [birth year] DOA: 10/17/2016 PCP: [name], MD  Brief Narrative: 70yom previous admission 08/2016 for fever, elevated LFTs, treated presumptively for tick-bourne illness, presented with fever, confusion, diarrhea. Lactic acid was normal. Admitted for further evaluation of fever, diarrhea, elevated LFTs.  Assessment/Plan Salmonella gastroenteritis as detected by gastrointestinal panel. -Diarrhea nearly resolved. Now afebrile 48 hours. Improving rapidly.  Elevated transaminases, pancytopenia. Right upper quadrant ultrasound unremarkable except for hepatic steatosis. Patient hospitalized June 2018 for presumed tickborne illness. He improved rapidly with doxycycline at that time. This hospitalization he has also improved rapidly, treated with Zosyn, vancomycin and doxycycline with improving platelet count, WBC and transaminases. He reports he's had no outside work since June of this year and no take bites. However it difficult to explain this laboratory pattern with simple gastroenteritis. Given his clinical improvement, will continue course of doxycycline. -Hepatitis panel pending.  DM type 2 -Remains stable. Continue sliding scale insulin.Marland Kitchen  PMH PAF, DVT -Stable. Continue Pradaxa.  Ischemic cardiomyopathy with ICD/defibrillator in place,  -Remains asymptomatic.  Multiple falls at home -PT has recommended skilled nursing facility for rehabilitation  Acute encephalopathy  -Resolved. Secondary to acute illness.  DVT prophylaxis: Pradaxa Code Status: full Family Communication: none Disposition Plan: Skilled nursing facility    [name], MD  [facility] Direct contact: [phone] --Via amion app OR  --[email or web address]; password [email or web address]  7PM-7AM contact night coverage as above 10/20/2016, 4:58 PM  LOS: 2 days   Consultants:    Procedures:    Antimicrobials:  Zosyn 8/4 >> 8/7  Vancomycin 8/4 >> 8/7]  Cipro  8/7 >> 8/10  Doxycycline 8/5 >>  Interval history/Subjective: Feeling much better today. No diarrhea today. No nausea or vomiting.  Objective: Vitals: Afebrile 48 hours. 97.7, 16, 58:35/55, 100% on room air.  Exam:     Constitutional. Appears calm, comfortable. Appears much better.  Respiratory. Clear to auscultation bilaterally. No wheezes, rales or rhonchi. Normal respiratory effort.  Cardiovascular. Regular rate and rhythm. No murmur, rub or gallop. No lower extremity edema.  Psychiatric. Grossly normal mood and affect. Speech fluent and appropriate.   I have personally reviewed the following:   Labs:  Potassium 3.0, remainder basic metabolic panel unremarkable.  AST and ALT trending downwards.  WBC trending up. Hemoglobin stable. Platelets trending upwards.  Imaging studies:    Medical tests:    Test discussed with performing physician:    Decision to obtain old records:    Review and summation of old records:    Scheduled Meds: . amiodarone  200 mg Oral Daily  . ciprofloxacin  500 mg Oral BID  . dabigatran  75 mg Oral Q12H  . insulin aspart  0-9 Units Subcutaneous TID WC  . levothyroxine  137 mcg Oral QAC breakfast  . metoprolol tartrate  50 mg Oral BID  . sodium chloride flush  3 mL Intravenous Q12H   Continuous Infusions: . sodium chloride 250 mL (10/20/16 1306)  . doxycycline (VIBRAMYCIN) IV Stopped (10/20/16 1215)    Principal Problem:   Salmonella gastroenteritis Active Problems:   Type 2 diabetes mellitus with stage 3 chronic kidney disease (HCC)   PACEMAKER, PERMANENT   Biventricular ICD (implantable cardioverter-defibrillator) in place   Cardiomyopathy, ischemic   Paroxysmal atrial fibrillation (HCC)   Thrombocytopenia (HCC)   Elevated transaminase level   Pancytopenia ([place])   DM type 2 (diabetes mellitus, type 2) ([place])   LOS: 2 days

## 2016-10-20 NOTE — Clinical Social Work Placement (Signed)
   CLINICAL SOCIAL WORK PLACEMENT  NOTE  Date:  10/20/2016  Patient Details  Name: DAVEYON KITCHINGS MRN: [id number] Date of Birth: 29-Dec-1945  Clinical Social Work is seeking post-discharge placement for this patient at the [place] level of care (*CSW will initial, date and re-position this form in  chart as items are completed):  Yes   Patient/family provided with [place] Work Department's list of facilities offering this level of care within the geographic area requested by the patient (or if unable, by the patient's family).  Yes   Patient/family informed of their freedom to choose among providers that offer the needed level of care, that participate in Medicare, Medicaid or managed care program needed by the patient, have an available bed and are willing to accept the patient.  Yes   Patient/family informed of [place]'s ownership interest in [facility] and [facility], as well as of the fact that they are under no obligation to receive care at these facilities.  PASRR submitted to EDS on 10/19/16     PASRR number received on 10/19/16     Existing PASRR number confirmed on       FL2 transmitted to all facilities in geographic area requested by pt/family on 10/19/16     FL2 transmitted to all facilities within larger geographic area on       Patient informed that his/her managed care company has contracts with or will negotiate with certain facilities, including the following:        Yes   Patient/family informed of bed offers received.  Patient chooses bed at [place] [facility]     Physician recommends and patient chooses bed at      Patient to be transferred to   on  .  Patient to be transferred to facility by       Patient family notified on   of transfer.  Name of family member notified:        PHYSICIAN       Additional Comment:    _______________________________________________ Ihor Gully,  LCSW 10/20/2016, 4:30 PM

## 2016-10-20 NOTE — Progress Notes (Signed)
Pharmacy Antibiotic Note  Alan Orr is a 71 y.o. male admitted on 10/17/2016 with sepsis.  Pharmacy has been consulted for vancomycin and zosyn dosing. Initial doses ordered in the ED  Plan: Vancomycin 750 IV every 12 hours.  Goal trough 15-20 mcg/mL. Zosyn 3.375g IV q8h (4 hour infusion).  Monitor labs, micro and vitals.  Check trough in AM  Height: 5\' 11"  (180.3 cm) Weight: 161 lb 7 oz (73.2 kg) IBW/kg (Calculated) : 75.3  Temp (24hrs), Avg:97.8 F (36.6 C), Min:97.5 F (36.4 C), Max:98.1 F (36.7 C)   Recent Labs Lab 10/17/16 1825 10/17/16 1835 10/18/16 0623 10/19/16 0600 10/20/16 0618  WBC 3.5*  --  2.4* 3.4* 3.5*  CREATININE 1.31*  --  1.08 1.10 1.05  LATICACIDVEN  --  1.22  --   --   --     Estimated Creatinine Clearance: 66.8 mL/min (by C-G formula based on SCr of 1.05 mg/dL).    No Known Allergies  Antimicrobials this admission:  Vanc 8/4 >>  Zosyn 8/4 >>  Doxy 8/5 >>  Dose adjustments this admission:  n/a   Microbiology results:  8/4 BC>> pending 8/4  UC>> (-) 8/4  Stool>>  8/6 CDiff PCR: (-)  Thank you for allowing pharmacy to be a part of this patient's care.  Pricilla Larsson 10/20/2016 10:45 AM

## 2016-10-21 ENCOUNTER — Encounter: Payer: Self-pay | Admitting: Cardiology

## 2016-10-21 DIAGNOSIS — R740 Nonspecific elevation of levels of transaminase and lactic acid dehydrogenase [LDH]: Secondary | ICD-10-CM

## 2016-10-21 DIAGNOSIS — A021 Salmonella sepsis: Secondary | ICD-10-CM

## 2016-10-21 DIAGNOSIS — I255 Ischemic cardiomyopathy: Secondary | ICD-10-CM

## 2016-10-21 DIAGNOSIS — R197 Diarrhea, unspecified: Secondary | ICD-10-CM

## 2016-10-21 DIAGNOSIS — Z95810 Presence of automatic (implantable) cardiac defibrillator: Secondary | ICD-10-CM

## 2016-10-21 DIAGNOSIS — I2589 Other forms of chronic ischemic heart disease: Secondary | ICD-10-CM

## 2016-10-21 LAB — CBC
HEMATOCRIT: 33.2 % — AB (ref 39.0–52.0)
Hemoglobin: 11.3 g/dL — ABNORMAL LOW (ref 13.0–17.0)
MCH: 34 pg (ref 26.0–34.0)
MCHC: 34 g/dL (ref 30.0–36.0)
MCV: 100 fL (ref 78.0–100.0)
PLATELETS: 160 10*3/uL (ref 150–400)
RBC: 3.32 MIL/uL — ABNORMAL LOW (ref 4.22–5.81)
RDW: 15.4 % (ref 11.5–15.5)
WBC: 4.1 10*3/uL (ref 4.0–10.5)

## 2016-10-21 LAB — COMPREHENSIVE METABOLIC PANEL
ALBUMIN: 2.4 g/dL — AB (ref 3.5–5.0)
ALK PHOS: 88 U/L (ref 38–126)
ALT: 269 U/L — AB (ref 17–63)
AST: 381 U/L — AB (ref 15–41)
Anion gap: 6 (ref 5–15)
BILIRUBIN TOTAL: 0.8 mg/dL (ref 0.3–1.2)
BUN: 13 mg/dL (ref 6–20)
CO2: 27 mmol/L (ref 22–32)
CREATININE: 1.08 mg/dL (ref 0.61–1.24)
Calcium: 8.1 mg/dL — ABNORMAL LOW (ref 8.9–10.3)
Chloride: 104 mmol/L (ref 101–111)
GFR calc Af Amer: >60 mL/min (ref >60)
GLUCOSE: 114 mg/dL — AB (ref 65–99)
POTASSIUM: 3.3 mmol/L — AB (ref 3.5–5.1)
Sodium: 137 mmol/L (ref 135–145)
TOTAL PROTEIN: 5.5 g/dL — AB (ref 6.5–8.1)

## 2016-10-21 LAB — VANCOMYCIN, TROUGH: Vancomycin Tr: 12 ug/mL — ABNORMAL LOW (ref 15–20)

## 2016-10-21 LAB — GLUCOSE, CAPILLARY
GLUCOSE-CAPILLARY: 105 mg/dL — AB (ref 65–99)
GLUCOSE-CAPILLARY: 147 mg/dL — AB (ref 65–99)
Glucose-Capillary: 71 mg/dL (ref 65–99)
Glucose-Capillary: 152 mg/dL — ABNORMAL HIGH (ref 65–99)

## 2016-10-21 LAB — HEPATITIS PANEL, ACUTE
HCV Ab: <0.1 s/co ratio (ref 0.0–0.9)
HEP A IGM: NEGATIVE
HEP B C IGM: NEGATIVE
HEP B S AG: NEGATIVE

## 2016-10-21 LAB — PROCALCITONIN: PROCALCITONIN: 0.45 ng/mL

## 2016-10-21 MED ORDER — CIPROFLOXACIN HCL 500 MG PO TABS: 500.0000 mg | ORAL_TABLET | Freq: Two times a day (BID) | ORAL | 0 refills | Status: DC

## 2016-10-21 NOTE — Progress Notes (Addendum)
PROGRESS NOTE  ONTARIO PETTENGILL CXK:[id number] DOB: 1945/04/30 DOA: 10/17/2016 PCP: [name], MD  Brief History:  47yom previous admission 08/2016 for fever, elevated LFTs, treated presumptively for tick-bourne illness, presented with fever, confusion, diarrhea. Lactic acid was normal. Admitted for further evaluation of fever, diarrhea, elevated LFTs.Blood cultures were negative during the hospitalization. The patient was started on ciprofloxacin and doxycycline. His LFTs improved. His diarrhea improved. He tolerated his diet. Right upper quadrant ultrasound showed steatosis and trace perihepatic ascites.  Assessment/Plan: Salmonella gastroenteritis -Patient initially on vancomycin and Zosyn which was narrowed -Continue ciprofloxacin for 4 more days to complete one week of therapy -Clinically improved -GI pathogen panel positive for salmonella -C. difficile negative  Transaminasemia -likely due to infectious process--he presented with fever and tachycardia -8/5 RUQ ultrasound neg for abscess; showed steatosis -Trending up again-->d/c amiodarone -Consult GI due to LFTs trending back up -Discontinue doxycycline--previous RMSF and Ehrlichia serologies unrevealing -No epidemiologic evidence for Lyme disease -Patient finished appropriate course of doxycycline from his last admission in June 2018 -Discontinue doxycycline -hep b surface antigen -hep c antibody -HIV  Pancytopenia -likely due to infectious process -improving with abx  DM type 2 -Remains stable. Continue sliding scale insulin.Marland Kitchen  PMH PAF, DVT -Stable. Continue Pradaxa. -continue metoprolol tartrate  Ischemic cardiomyopathy with ICD/defibrillator in place,  -Remains asymptomatic. -Clinically compensated -continue metoprolol and altace  Multiple falls at home -PT has recommended skilled nursing facility for rehabilitation  Acute metabolic encephalopathy  -Resolved. Secondary to acute  illness.  Hyperlipidemia -continue statin, niaspan    Disposition Plan:   Home when LFTs improve Family Communication:  No Family at bedside--Total time spent 35 minutes.  Greater than 50% spent face to face counseling and coordinating care.   Consultants:  GI  Code Status:  FULL  DVT Prophylaxis:  Pradaxa   Procedures: As Listed in Progress Note Above  Antibiotics:  Zosyn 8/4 >> 8/7  Vancomycin 8/4 >> 8/7]  Cipro 8/7 >>>  Doxycycline 8/5 >>    Subjective: Patient denies fevers, chills, headache, chest pain, dyspnea, nausea, vomiting, diarrhea, abdominal pain, dysuria, hematuria, hematochezia, and melena.   Objective: Vitals:   10/20/16 0957 10/20/16 2100 10/20/16 2127 10/21/16 0929  BP:  114/65  127/62  Pulse:  98  72  Resp:  20  18  Temp:  97.8 F (36.6 C)  97.8 F (36.6 C)  TempSrc:  Oral  Oral  SpO2: 100% 98% 97% 100%  Weight:      Height:        Intake/Output Summary (Last 24 hours) at 10/21/16 1235 Last data filed at 10/21/16 1006  Gross per 24 hour  Intake           332.17 ml  Output             1500 ml  Net         -1167.83 ml   Weight change:  Exam:   General:  Pt is alert, follows commands appropriately, not in acute distress  HEENT: No icterus, No thrush, No neck mass, [place]/AT  Cardiovascular: RRR, S1/S2, no rubs, no gallops  Respiratory: CTA bilaterally, no wheezing, no crackles, no rhonchi  Abdomen: Soft/+BS, non tender, non distended, no guarding  Extremities: No edema, No lymphangitis, No petechiae, No rashes, no synovitis   Data Reviewed: I have personally reviewed following labs and imaging studies Basic Metabolic Panel:  Recent Labs Lab 10/17/16 1825 10/18/16 0623 10/19/16 0600 10/20/16  0618 10/21/16 1019  NA 138 136 134* 136 137  K 3.8 3.5 3.3* 3.0* 3.3*  CL 107 105 102 104 104  CO2 27 24 26 26 27   GLUCOSE 104* 104* 67 97 114*  BUN 14 15 17 16 13   CREATININE 1.31* 1.08 1.10 1.05 1.08  CALCIUM 7.8* 7.7*  7.8* 7.9* 8.1*   Liver Function Tests:  Recent Labs Lab 10/17/16 1825 10/19/16 0600 10/20/16 0618 10/21/16 1019  AST 147* 396* 265* 381*  ALT 67* 200* 160* 269*  ALKPHOS 77 64 65 88  BILITOT 0.9 0.8 1.1 0.8  PROT 5.6* 5.0* 4.9* 5.5*  ALBUMIN 2.4* 2.2* 2.1* 2.4*   No results for input(s): LIPASE, AMYLASE in the last 168 hours. No results for input(s): AMMONIA in the last 168 hours. Coagulation Profile: No results for input(s): INR, PROTIME in the last 168 hours. CBC:  Recent Labs Lab 10/17/16 1825 10/18/16 0623 10/19/16 0600 10/20/16 0618 10/21/16 1019  WBC 3.5* 2.4* 3.4* 3.5* 4.1  NEUTROABS 2.9  --  1.9  --   --   HGB 10.9* 10.5* 10.9* 10.5* 11.3*  HCT 32.4* 31.2* 32.2* 30.7* 33.2*  MCV 103.5* 102.6* 101.3* 99.7 100.0  PLT 114* 100* 114* 125* 160   Cardiac Enzymes:  Recent Labs Lab 10/17/16 1825  CKTOTAL 156   BNP: Invalid input(s): POCBNP CBG:  Recent Labs Lab 10/20/16 0821 10/20/16 1131 10/20/16 1657 10/20/16 2031 10/21/16 0814  GLUCAP 73 118* 114* 126* 71   HbA1C: No results for input(s): HGBA1C in the last 72 hours. Urine analysis:    Component Value Date/Time   COLORURINE YELLOW 10/17/2016 1933   APPEARANCEUR CLEAR 10/17/2016 1933   LABSPEC 1.017 10/17/2016 1933   PHURINE 6.0 10/17/2016 1933   GLUCOSEU NEGATIVE 10/17/2016 1933   HGBUR NEGATIVE 10/17/2016 [place] NEGATIVE 10/17/2016 [place] NEGATIVE 10/17/2016 1933   PROTEINUR NEGATIVE 10/17/2016 1933   UROBILINOGEN 0.2 01/05/2015 1245   NITRITE NEGATIVE 10/17/2016 [place] 10/17/2016 1933   Sepsis Labs: @LABRCNTIP (procalcitonin:4,lacticidven:4) ) Recent Results (from the past 240 hour(s))  Blood Culture (routine x 2)     Status: None (Preliminary result)   Collection Time: 10/17/16  6:25 PM  Result Value Ref Range Status   Specimen Description LEFT ANTECUBITAL  Final   Special Requests Blood Culture adequate volume  Final   Culture NO  GROWTH 4 DAYS  Final   Report Status PENDING  Incomplete  Blood Culture (routine x 2)     Status: None (Preliminary result)   Collection Time: 10/17/16  6:42 PM  Result Value Ref Range Status   Specimen Description BLOOD LEFT ARM  Final   Special Requests Blood Culture adequate volume  Final   Culture PENDING  Incomplete   Report Status PENDING  Incomplete  Urine Culture     Status: None   Collection Time: 10/17/16  7:33 PM  Result Value Ref Range Status   Specimen Description URINE, CLEAN CATCH  Final   Special Requests NONE  Final   Culture   Final    NO GROWTH Performed at [facility], [place] [address], [place],  [address]    Report Status 10/19/2016 FINAL  Final  Gastrointestinal Panel by PCR , Stool     Status: Abnormal   Collection Time: 10/19/16 11:43 AM  Result Value Ref Range Status   Campylobacter species NOT DETECTED NOT DETECTED Final   Plesimonas shigelloides NOT DETECTED NOT DETECTED Final  Salmonella species DETECTED (A) NOT DETECTED Final    Comment: RESULT CALLED TO, READ BACK BY AND VERIFIED WITH:  BRITTANY FOLEY AT 1418 10/20/16 SDR    Yersinia enterocolitica NOT DETECTED NOT DETECTED Final   Vibrio species NOT DETECTED NOT DETECTED Final   Vibrio cholerae NOT DETECTED NOT DETECTED Final   Enteroaggregative E coli (EAEC) NOT DETECTED NOT DETECTED Final   Enteropathogenic E coli (EPEC) NOT DETECTED NOT DETECTED Final   Enterotoxigenic E coli (ETEC) NOT DETECTED NOT DETECTED Final   Shiga like toxin producing E coli (STEC) NOT DETECTED NOT DETECTED Final   Shigella/Enteroinvasive E coli (EIEC) NOT DETECTED NOT DETECTED Final   Cryptosporidium NOT DETECTED NOT DETECTED Final   Cyclospora cayetanensis NOT DETECTED NOT DETECTED Final   Entamoeba histolytica NOT DETECTED NOT DETECTED Final   Giardia lamblia NOT DETECTED NOT DETECTED Final   Adenovirus F40/41 NOT DETECTED NOT DETECTED Final   Astrovirus NOT DETECTED NOT DETECTED Final   Norovirus  GI/GII NOT DETECTED NOT DETECTED Final   Rotavirus A NOT DETECTED NOT DETECTED Final   Sapovirus (I, II, IV, and V) NOT DETECTED NOT DETECTED Final  C difficile quick scan w PCR reflex     Status: None   Collection Time: 10/19/16 11:43 AM  Result Value Ref Range Status   C Diff antigen NEGATIVE NEGATIVE Final   C Diff toxin NEGATIVE NEGATIVE Final   C Diff interpretation No C. difficile detected.  Final     Scheduled Meds: . amiodarone  200 mg Oral Daily  . ciprofloxacin  500 mg Oral BID  . dabigatran  75 mg Oral Q12H  . insulin aspart  0-9 Units Subcutaneous TID WC  . levothyroxine  137 mcg Oral QAC breakfast  . metoprolol tartrate  50 mg Oral BID  . sodium chloride flush  3 mL Intravenous Q12H   Continuous Infusions: . sodium chloride Stopped (10/20/16 1701)  . doxycycline (VIBRAMYCIN) IV 100 mg (10/21/16 1004)    Procedures/Studies: Dg Chest Port 1 View  Result Date: 10/17/2016 CLINICAL DATA:  SEPSIS, Per EMS pt is weak and confused. Symptoms began last night. His nephew visited him last night and stated that he was acting right. Family called EMS today. Pt has also been having diarrhea and has stool on him EXAM: PORTABLE CHEST 1 VIEW COMPARISON:  08/22/2016 FINDINGS: Patient's left-sided transvenous pacemaker with leads to the right atrium, right ventricle, and coronary sinus. The lungs are free of focal consolidations and pleural effusions. No pulmonary edema. IMPRESSION: No evidence for acute cardiopulmonary abnormality. Electronically Signed   By: Nolon Nations M.D.   On: 10/17/2016 18:48   US Abdomen Limited Ruq  Result Date: 10/18/2016 CLINICAL DATA:  Elevated liver function tests. EXAM: ULTRASOUND ABDOMEN LIMITED RIGHT UPPER QUADRANT COMPARISON:  CT abdomen and pelvis 12/12/2013. FINDINGS: Gallbladder: No gallstones or wall thickening visualized. No sonographic Murphy sign noted by sonographer. Common bile duct: Diameter: 0.7 cm Liver: No focal lesion identified.  Echogenicity is increased. Small amount of perihepatic ascites is identified. IMPRESSION: Fatty infiltration of the liver. Tiny amount of perihepatic ascites. Electronically Signed   By: Inge Rise M.D.   On: 10/18/2016 12:37    Analeise Mccleery, DO  [facility] Pager [phone]  If 7PM-7AM, please contact night-coverage [email or web address] Password [email or web address] 10/21/2016, 12:35 PM   LOS: 3 days

## 2016-10-21 NOTE — Consult Note (Signed)
Reason for Consult:elevated liver enzymes Referring Physician: Hospitalist services  Alan Orr is an 71 y.o. male.  HPI: Admitted thru the ED 10/18/2015.  Diarrhea x 1 day. No nausea or vomiting. Weak. No abdominal pain.  Noted to have fever 103 ? Etiology.  Recently treated for a tick born illness about 6 weeks ago and covered with Doxycycline  Also noted to have elevated liver enzymes.  No prior hx of drug abuse.  Lliver enzymes elevated in June  Liver enzymes normal in April of 2017.  RMSP fever IgG positive in June B burgdorferi Ab IgG+IgM elevated.  Acute Hepatitis panel negative Blood cultures/RMSF  are pending this admission.  US revealed fatty infiltration of the liver. Tiny am of perihepatic ascites.    Does take Amiodarone which can elevated liver enzymes. Denies any abdominal pain. States he feels good. Waiting to go to [facility] for Rehab. His appetite has remained good.  States he feels great   Hx of CAD, pacemaker, DVT, and atrial fib. Covered with Pradaxa.       Past Medical History:  Diagnosis Date  . CAD (coronary artery disease)   . DM (diabetes mellitus) ([place])    type 2   . DVT (deep venous thrombosis) ([place])   . Hyperlipidemia   . Hypertension   . ICD (implantable cardioverter-defibrillator), single, in situ   . Ischemic cardiomyopathy   . PAF (paroxysmal atrial fibrillation) ([place])   . RBBB   . S/P CABG x 5     Past Surgical History:  Procedure Laterality Date  . BACK SURGERY    . CARDIAC CATHETERIZATION  05/08/2009   small vessel disease  . CARDIAC DEFIBRILLATOR PLACEMENT  05/09/2009   [employer]  . CORONARY ARTERY BYPASS GRAFT  12/31/1995   LIMA to LAD,SVG to intermediate,SVG to CX,seq. svg to posterior descending and posterolateral RCA  . EP IMPLANTABLE DEVICE N/A 09/25/2015   Procedure: PPM/BIV PPM Generator Changeout;  Surgeon: Evans Lance, MD;  Location: [facility];  Service: Cardiovascular;  Laterality: N/A;  . I&D  EXTREMITY Left 11/03/2013   Procedure: Left Leg Debride Ulcer, Apply Wound VAC and [place];  Surgeon: Newt Minion, MD;  Location: [facility];  Service: Orthopedics;  Laterality: Left;  . TEE WITHOUT CARDIOVERSION N/A 01/11/2015   Procedure: TRANSESOPHAGEAL ECHOCARDIOGRAM (TEE);  Surgeon: Herminio Commons, MD;  Location[facility];  Service: Cardiology;  Laterality: N/A;    Family History  Problem Relation Age of Onset  . Heart attack Mother   . Stroke Father     Social History:  reports that he quit smoking about 20 years ago. His smoking use included Cigarettes. He has a 52.50 pack-year smoking history. He has never used smokeless tobacco. He reports that he drinks alcohol. He reports that he does not use drugs.  Allergies: No Known Allergies  Medications: I have reviewed the patient's current medications.  Results for orders placed or performed during the hospital encounter of 10/17/16 (from the past 48 hour(s))  Glucose, capillary     Status: Abnormal   Collection Time: 10/19/16  4:33 PM  Result Value Ref Range   Glucose-Capillary 103 (H) 65 - 99 mg/dL  Comprehensive metabolic panel     Status: Abnormal   Collection Time: 10/20/16  6:18 AM  Result Value Ref Range   Sodium 136 135 - 145 mmol/L   Potassium 3.0 (L) 3.5 - 5.1 mmol/L   Chloride 104 101 - 111 mmol/L   CO2 26  22 - 32 mmol/L   Glucose, Bld 97 65 - 99 mg/dL   BUN 16 6 - 20 mg/dL   Creatinine, Ser 1.05 0.61 - 1.24 mg/dL   Calcium 7.9 (L) 8.9 - 10.3 mg/dL   Total Protein 4.9 (L) 6.5 - 8.1 g/dL   Albumin 2.1 (L) 3.5 - 5.0 g/dL   AST 265 (H) 15 - 41 U/L   ALT 160 (H) 17 - 63 U/L   Alkaline Phosphatase 65 38 - 126 U/L   Total Bilirubin 1.1 0.3 - 1.2 mg/dL   GFR calc non Af Amer >60 >60 mL/min   GFR calc Af Amer >60 >60 mL/min    Comment: (NOTE) The eGFR has been calculated using the CKD EPI equation. This calculation has not been validated in all clinical situations. eGFR's persistently <60 mL/min signify  possible Chronic Kidney Disease.    Anion gap 6 5 - 15  CBC     Status: Abnormal   Collection Time: 10/20/16  6:18 AM  Result Value Ref Range   WBC 3.5 (L) 4.0 - 10.5 K/uL   RBC 3.08 (L) 4.22 - 5.81 MIL/uL   Hemoglobin 10.5 (L) 13.0 - 17.0 g/dL   HCT 30.7 (L) 39.0 - 52.0 %   MCV 99.7 78.0 - 100.0 fL   MCH 34.1 (H) 26.0 - 34.0 pg   MCHC 34.2 30.0 - 36.0 g/dL   RDW 15.2 11.5 - 15.5 %   Platelets 125 (L) 150 - 400 K/uL  Glucose, capillary     Status: None   Collection Time: 10/20/16  8:21 AM  Result Value Ref Range   Glucose-Capillary 73 65 - 99 mg/dL  Glucose, capillary     Status: Abnormal   Collection Time: 10/20/16 11:31 AM  Result Value Ref Range   Glucose-Capillary 118 (H) 65 - 99 mg/dL  Glucose, capillary     Status: Abnormal   Collection Time: 10/20/16  4:57 PM  Result Value Ref Range   Glucose-Capillary 114 (H) 65 - 99 mg/dL   Comment 1 Notify RN    Comment 2 Document in Chart   Glucose, capillary     Status: Abnormal   Collection Time: 10/20/16  8:31 PM  Result Value Ref Range   Glucose-Capillary 126 (H) 65 - 99 mg/dL  Procalcitonin     Status: None   Collection Time: 10/21/16  5:23 AM  Result Value Ref Range   Procalcitonin 0.45 ng/mL    Comment:        Interpretation: PCT (Procalcitonin) <= 0.5 ng/mL: Systemic infection (sepsis) is not likely. Local bacterial infection is possible. (NOTE)         ICU PCT Algorithm               Non ICU PCT Algorithm    ----------------------------     ------------------------------         PCT < 0.25 ng/mL                 PCT < 0.1 ng/mL     Stopping of antibiotics            Stopping of antibiotics       strongly encouraged.               strongly encouraged.    ----------------------------     ------------------------------       PCT level decrease by               PCT < 0.25 ng/mL       >=  80% from peak PCT       OR PCT 0.25 - 0.5 ng/mL          Stopping of antibiotics                                              encouraged.     Stopping of antibiotics           encouraged.    ----------------------------     ------------------------------       PCT level decrease by              PCT >= 0.25 ng/mL       < 80% from peak PCT        AND PCT >= 0.5 ng/mL            Continuin g antibiotics                                              encouraged.       Continuing antibiotics            encouraged.    ----------------------------     ------------------------------     PCT level increase compared          PCT > 0.5 ng/mL         with peak PCT AND          PCT >= 0.5 ng/mL             Escalation of antibiotics                                          strongly encouraged.      Escalation of antibiotics        strongly encouraged.   Vancomycin, trough     Status: Abnormal   Collection Time: 10/21/16  5:23 AM  Result Value Ref Range   Vancomycin Tr 12 (L) 15 - 20 ug/mL  Glucose, capillary     Status: None   Collection Time: 10/21/16  8:14 AM  Result Value Ref Range   Glucose-Capillary 71 65 - 99 mg/dL   Comment 1 Notify RN    Comment 2 Document in Chart   Comprehensive metabolic panel     Status: Abnormal   Collection Time: 10/21/16 10:19 AM  Result Value Ref Range   Sodium 137 135 - 145 mmol/L   Potassium 3.3 (L) 3.5 - 5.1 mmol/L   Chloride 104 101 - 111 mmol/L   CO2 27 22 - 32 mmol/L   Glucose, Bld 114 (H) 65 - 99 mg/dL   BUN 13 6 - 20 mg/dL   Creatinine, Ser 1.08 0.61 - 1.24 mg/dL   Calcium 8.1 (L) 8.9 - 10.3 mg/dL   Total Protein 5.5 (L) 6.5 - 8.1 g/dL   Albumin 2.4 (L) 3.5 - 5.0 g/dL   AST 381 (H) 15 - 41 U/L   ALT 269 (H) 17 - 63 U/L   Alkaline Phosphatase 88 38 - 126 U/L   Total Bilirubin 0.8 0.3 - 1.2 mg/dL   GFR calc non Af Amer >60 >60 mL/min   GFR calc Af  Amer >60 >60 mL/min    Comment: (NOTE) The eGFR has been calculated using the CKD EPI equation. This calculation has not been validated in all clinical situations. eGFR's persistently <60 mL/min signify possible Chronic  Kidney Disease.    Anion gap 6 5 - 15  CBC     Status: Abnormal   Collection Time: 10/21/16 10:19 AM  Result Value Ref Range   WBC 4.1 4.0 - 10.5 K/uL   RBC 3.32 (L) 4.22 - 5.81 MIL/uL   Hemoglobin 11.3 (L) 13.0 - 17.0 g/dL   HCT 33.2 (L) 39.0 - 52.0 %   MCV 100.0 78.0 - 100.0 fL   MCH 34.0 26.0 - 34.0 pg   MCHC 34.0 30.0 - 36.0 g/dL   RDW 15.4 11.5 - 15.5 %   Platelets 160 150 - 400 K/uL  Glucose, capillary     Status: Abnormal   Collection Time: 10/21/16 12:35 PM  Result Value Ref Range   Glucose-Capillary 105 (H) 65 - 99 mg/dL   Comment 1 Notify RN    Comment 2 Document in Chart     No results found.  ROS Blood pressure 127/62, pulse 72, temperature 97.8 F (36.6 C), temperature source Oral, resp. rate 18, height _0  (1.803 m), weight 161 lb 7 oz (73.2 kg), SpO2 100 %. Physical Exam Alert and oriented. Skin warm and dry. Oral mucosa is moist.   . Sclera anicteric, conjunctivae is pink. Thyroid not enlarged. No cervical lymphadenopathy. Lungs clear. Heart regular rate and rhythm.  Abdomen is soft. Bowel sounds are positive. No hepatomegaly. No abdominal masses felt. No tenderness.  No edema to lower extremities.  Very hard of hearing. .      Assessment/Plan: Elevated transaminases. Hepatitis Panel negative. Further recommendations to follow. Amiodarone is known to bump liver enzymes which is on hold . Will discuss with Dr. Laural Golden.   Micala Saltsman W 10/21/2016, 2:09 PM

## 2016-10-21 NOTE — Progress Notes (Signed)
Full note to follow.  Patient is 71 year old Caucasian male with history of tick bite who was hospitalized about 2 months ago and noted to have elevated transaminases. No prior history of liver disease. Transaminases are normal in April 2017. During that admission he had multiple studies. RMSF antibody IgG was positive but IgM was negative.  Testing for Lyme disease revealed following antibodies to be positive IgG P 58, IgG P 45, IgG 41, IgM IgM 839, IgM peak 23 and line IgM WBC. B. Burgdoferi antibody IgG plus IgM was positive. Anaplasma phagocytophilum DNA negative by PCR(human granulocytic Ehrlichiosis. Patient was felt to have febrile illness secondary to tick point illness associated with elevated transaminases and thrombocytopenia. That admission was prompted by few week history of progressive weakness intermittent fever and self-limited diarrhea. C. difficile by PCR was negative. He was treated with doxycycline.  This time he presented 4 days ago with progressive weakness and confusion. He was febrile. He was noted to have elevated transaminases and mild thrombocytopenia. He was felt to be septic and treated with broad-spectrum antibiotics. He was on Zosyn and vancomycin and now he is on doxycycline and Cipro. Mental status is improved. GI pathogen panel positive for salmonella species. Blood cultures remained negative. Ultrasound revealed echogenic liver consistent with fatty liver and minimal perihepatic ascites but no evidence of dilated bile duct splenomegaly or changes suggestive of cirrhosis. He had acute hepatitis panel on admission and it is negative. Patient denies nausea vomiting or abdominal pain. He feels much better. He is not aware that his transaminases have been elevated in the past. He has been on atorvastatin and ezetimibe. Both of these medications have been discontinued. He remains on amiodarone. Abdominal examination is negative for hepato-or  splenomegaly.   Assessment:  Elevated transaminases of 2 months duration now with upward trend initially thought to be due to take bone illness but therapy has not resulted in resolution of transaminitis. Now he is found to have positive stool test for salmonella species on PCR testing. Differential diagnosis includes hepatotoxicity secondary to amiodarone or statin which has been discontinued as well as autoimmune injury or hepatocellular injury secondary to infection. Serum albumin is low and may be indicative of malnutrition rather than chronic liver disease.  Recommendations:  Repeat LFTs in a.m.. Will also check sedimentation rate and anal pain. Will check with Dr. Gwenlyn Found patient's cardiologist if amiodarone dose could be reduced or if this medication could be stopped. Further recommendations to follow.

## 2016-10-22 ENCOUNTER — Telehealth: Payer: Self-pay | Admitting: Cardiovascular Disease

## 2016-10-22 DIAGNOSIS — R197 Diarrhea, unspecified: Secondary | ICD-10-CM

## 2016-10-22 DIAGNOSIS — R740 Nonspecific elevation of levels of transaminase and lactic acid dehydrogenase [LDH]: Secondary | ICD-10-CM

## 2016-10-22 DIAGNOSIS — D696 Thrombocytopenia, unspecified: Secondary | ICD-10-CM

## 2016-10-22 DIAGNOSIS — A020 Salmonella enteritis: Principal | ICD-10-CM

## 2016-10-22 DIAGNOSIS — D61818 Other pancytopenia: Secondary | ICD-10-CM

## 2016-10-22 DIAGNOSIS — I480 Paroxysmal atrial fibrillation: Secondary | ICD-10-CM

## 2016-10-22 DIAGNOSIS — E118 Type 2 diabetes mellitus with unspecified complications: Secondary | ICD-10-CM

## 2016-10-22 LAB — COMPREHENSIVE METABOLIC PANEL
ALK PHOS: 94 U/L (ref 38–126)
ALT: 278 U/L — AB (ref 17–63)
AST: 320 U/L — ABNORMAL HIGH (ref 15–41)
Albumin: 2.4 g/dL — ABNORMAL LOW (ref 3.5–5.0)
Anion gap: 3 — ABNORMAL LOW (ref 5–15)
BUN: 13 mg/dL (ref 6–20)
CALCIUM: 8.3 mg/dL — AB (ref 8.9–10.3)
CO2: 29 mmol/L (ref 22–32)
Chloride: 107 mmol/L (ref 101–111)
Creatinine, Ser: 1.02 mg/dL (ref 0.61–1.24)
Glucose, Bld: 87 mg/dL (ref 65–99)
Potassium: 3.3 mmol/L — ABNORMAL LOW (ref 3.5–5.1)
Sodium: 139 mmol/L (ref 135–145)
TOTAL PROTEIN: 5.6 g/dL — AB (ref 6.5–8.1)
Total Bilirubin: 0.9 mg/dL (ref 0.3–1.2)

## 2016-10-22 LAB — SEDIMENTATION RATE: SED RATE: 20 mm/h — AB (ref 0–16)

## 2016-10-22 LAB — CULTURE, BLOOD (ROUTINE X 2)
Culture: NO GROWTH
SPECIAL REQUESTS: ADEQUATE

## 2016-10-22 LAB — GLUCOSE, CAPILLARY
GLUCOSE-CAPILLARY: 83 mg/dL (ref 65–99)
Glucose-Capillary: 120 mg/dL — ABNORMAL HIGH (ref 65–99)

## 2016-10-22 MED ORDER — ALPRAZOLAM 1 MG PO TABS: 1.0000 mg | ORAL_TABLET | Freq: Every day | ORAL | 0 refills | Status: DC | PRN

## 2016-10-22 MED ORDER — CIPROFLOXACIN HCL 500 MG PO TABS: 500.0000 mg | ORAL_TABLET | Freq: Two times a day (BID) | ORAL | 0 refills | Status: DC

## 2016-10-22 MED ORDER — ZOLPIDEM TARTRATE 10 MG PO TABS: 10.0000 mg | ORAL_TABLET | Freq: Every evening | ORAL | 0 refills | Status: DC | PRN

## 2016-10-22 NOTE — Progress Notes (Signed)
  Subjective:  Patient has no complaints. He denies nausea vomiting or abdominal pain. He also denies diarrhea.  Objective: Blood pressure (!) 131/51, pulse (!) 56, temperature 98.1 F (36.7 C), temperature source Oral, resp. rate 20, height 5\' 11"  (1.803 m), weight 161 lb 7 oz (73.2 kg), SpO2 100 %. Patient is alert and in no acute distress. Abdomen is soft and nontender without organomegaly or masses. No LE edema or clubbing noted.  Labs/studies Results:   Recent Labs  10/20/16 0618 10/21/16 1019  WBC 3.5* 4.1  HGB 10.5* 11.3*  HCT 30.7* 33.2*  PLT 125* 160    BMET   Recent Labs  10/20/16 0618 10/21/16 1019 10/22/16 0626  NA 136 137 139  K 3.0* 3.3* 3.3*  CL 104 104 107  CO2 26 27 29   GLUCOSE 97 114* 87  BUN 16 13 13   CREATININE 1.05 1.08 1.02  CALCIUM 7.9* 8.1* 8.3*    LFT   Recent Labs  10/20/16 0618 10/21/16 1019 10/22/16 0626  PROT 4.9* 5.5* 5.6*  ALBUMIN 2.1* 2.4* 2.4*  AST 265* 381* 320*  ALT 160* 269* 278*  ALKPHOS 65 88 94  BILITOT 1.1 0.8 0.9    Sedimentation rate is 20 (normal up to 16). ANA pending  Assessment:  #1. Hepatocellular injury most likely secondary to amiodarone which has been discontinued and Dr. Roderic Palau has discussed patient's situation with Dr. Debara Pickett who was been covering for Dr. Gwenlyn Found. Patient does not have stigmata of chronic liver disease. Unless transaminases are trending downwards he may need a liver biopsy.  #2. Anemia secondary to acute illness. H&H is coming up.  #3. Mild thrombocytopenia. Lela count is normal today..  Recommendations:  Patient will have LFTs on 10/26/2016 at which time will determine whether he should undergo liver biopsy or continued observation.

## 2016-10-22 NOTE — Telephone Encounter (Signed)
Spoke with Dr. Roderic Palau - patient was advised to stop amiodarone due to significantly elevated liver enzymes. Will need follow-up with Dr. Gwenlyn Found.  Dr. Lemmie Evens (DOD)

## 2016-10-22 NOTE — Care Management Note (Signed)
Case Management Note  Patient Details  Name: Alan Orr MRN: [id number] Date of Birth: [birth year]  Expected Discharge Date:  10/22/16               Expected Discharge Plan:  [place]  In-House Referral:  Clinical Social Work  Discharge planning Services  CM Consult  Post Acute Care Choice:  NA Choice offered to:  NA  Status of Service:  Completed, signed off  If discussed at [employer] of Stay Meetings, dates discussed:  10/22/2016  Additional Comments: DC to SNF today. CSW has made arrangements.   Sherald Barge, RN 10/22/2016, 3:16 PM

## 2016-10-22 NOTE — Telephone Encounter (Signed)
Dr. Ulice Bold is seeing patient in hospital [facility]) Patient of Dr. Gwenlyn Found w/AF on amiodarone but w/elevated LFTs HR in 50s-60s - he would like to HOLD amiodarone and will order repeat LFTs post-discharge Would like cardiology opinion on this  Call back [phone]

## 2016-10-22 NOTE — Clinical Social Work Placement (Signed)
   CLINICAL SOCIAL WORK PLACEMENT  NOTE  Date:  10/22/2016  Patient Details  Name: Alan Orr MRN: [id number] Date of Birth: 03-28-1945  Clinical Social Work is seeking post-discharge placement for this patient at the Eugenio Saenz level of care (*CSW will initial, date and re-position this form in  chart as items are completed):  Yes   Patient/family provided with [facility] Work Department's list of facilities offering this level of care within the geographic area requested by the patient (or if unable, by the patient's family).  Yes   Patient/family informed of their freedom to choose among providers that offer the needed level of care, that participate in Medicare, Medicaid or managed care program needed by the patient, have an available bed and are willing to accept the patient.  Yes   Patient/family informed of [facility]'s ownership interest in [facility] and [facility], as well as of the fact that they are under no obligation to receive care at these facilities.  PASRR submitted to EDS on 10/19/16     PASRR number received on 10/19/16     Existing PASRR number confirmed on       FL2 transmitted to all facilities in geographic area requested by pt/family on 10/19/16     FL2 transmitted to all facilities within larger geographic area on       Patient informed that his/her managed care company has contracts with or will negotiate with certain facilities, including the following:        Yes   Patient/family informed of bed offers received.  Patient chooses bed at [facility]     Physician recommends and patient chooses bed at      Patient to be transferred to [facility] [place] on 10/22/16.  Patient to be transferred to facility by Patient reports that his sister is going to pick him up and transport him to [facility] when she returns from her doctor's appointment.     Patient family notified on 10/22/16 of  transfer.  Name of family member notified:  Patient's sister is aware of discharge per patient.      PHYSICIAN       Additional Comment: Clinicals faxed to facility. LCSW Signing off.    _______________________________________________ Ambrose Pancoast D, LCSW 10/22/2016, 3:06 PM

## 2016-10-22 NOTE — Telephone Encounter (Signed)
New message       Talk to the DOD

## 2016-10-22 NOTE — Discharge Summary (Addendum)
Physician Discharge Summary  Alan Orr URK:[id number] DOB: 12/30/1945 DOA: 10/17/2016  PCP: [name], MD  Admit date: 10/17/2016 Discharge date: 10/22/2016  Admitted From: Home Disposition:  Home  Recommendations for Outpatient Follow-up:  1. Follow up with PCP in 1-2 weeks 2. Please obtain BMP/CBC in one week 3. Repeat liver function tests on 8/13 4. Follow-up with gastroenterology to be arranged based on follow up LFTs 5. Follow-up with cardiology as scheduled. Amiodarone and statin currently on hold due to elevated LFTs 6. Patient was recommended to go to skilled nursing facility, but has refused and has decided to return home. He also refuses any home health.  Discharge Condition: stable CODE STATUS: full code Diet recommendation: Heart Healthy / Carb Modified   Brief/Interim Summary: 70yom previous admission 08/2016 for fever, elevated LFTs, treated presumptively for tick-bourne illness, presented with fever, confusion, diarrhea. Lactic acid was normal. Admitted for further evaluation of fever, diarrhea, elevated LFTs.Blood cultures were negative during the hospitalization. The patient was started on ciprofloxacin and doxycycline. His LFTs improved. His diarrhea improved. He tolerated his diet. Right upper quadrant ultrasound showed steatosis and trace perihepatic ascites.  Discharge Diagnoses:  Principal Problem:   Salmonella gastroenteritis Active Problems:   Type 2 diabetes mellitus with stage 3 chronic kidney disease (HCC)   PACEMAKER, PERMANENT   Biventricular ICD (implantable cardioverter-defibrillator) in place   Cardiomyopathy, ischemic   Paroxysmal atrial fibrillation (HCC)   Thrombocytopenia (HCC)   Transaminasemia   Pancytopenia ([place])   DM type 2 (diabetes mellitus, type 2) ([name])   Salmonella sepsis ([name])  Salmonella gastroenteritis -Patient initially on vancomycin and Zosyn which was narrowed to ciprofloxacin -He will be treated for total of 7  days -Clinically improved -GI pathogen panel positive for salmonella -C. difficile negative  Transaminasemia -likely due to infectious process--he presented with fever and tachycardia -8/5 RUQ ultrasound neg for abscess; showed steatosis -Trending up again-->d/c amiodarone and statin -seen by GI and recommendations were to follow clinically, if LFTs do not trend down in the next few weeks, he may nee a liver biopsy. This will be followed up as an outpatient. Discussed with Dr. Laural Golden who agrees with discharge today and outpatient follow up labs. -Discontinue doxycycline--previous RMSF and Ehrlichia serologies unrevealing -No epidemiologic evidence for Lyme disease -Patient finished appropriate course of doxycycline from his last admission in June 2018 -hepatitis panel negative -HIV in process  Pancytopenia -likely due to infectious process -improving with abx  DM type 2 -Remains stable.  PMH PAF, DVT -Stable. Continue Pradaxa. -continue metoprolol tartrate  Ischemic cardiomyopathy with ICD/defibrillator in place,  -Remains asymptomatic. -Clinically compensated -continue metoprolol and altace  Multiple falls at home -PT has recommended skilled nursing facility for rehabilitation. Patient has refused and has elected to go home. He refuses any home health  Acute metabolic encephalopathy  -Resolved. Secondary to acute illness. -present on admission  Hyperlipidemia -continue niaspan, hold statin   Discharge Instructions  Discharge Instructions    Diet - low sodium heart healthy    Complete by:  As directed    Increase activity slowly    Complete by:  As directed      Allergies as of 10/22/2016   No Known Allergies     Medication List    STOP taking these medications   amiodarone 200 MG tablet Commonly known as:  PACERONE   atorvastatin 40 MG tablet Commonly known as:  LIPITOR   oxyCODONE-acetaminophen 10-325 MG tablet Commonly known as:  PERCOCET  TAKE these medications   ALPRAZolam 1 MG tablet Commonly known as:  XANAX Take 1 tablet (1 mg total) by mouth daily as needed for anxiety or sleep.   aspirin EC 81 MG tablet Take 81 mg by mouth at bedtime.   ciprofloxacin 500 MG tablet Commonly known as:  CIPRO Take 1 tablet (500 mg total) by mouth 2 (two) times daily. X 4 more days   ezetimibe 10 MG tablet Commonly known as:  ZETIA TAKE 1 TABLET ONCE DAILY FOR CHOLESTEROL.   ferrous sulfate 325 (65 FE) MG tablet Take 325 mg by mouth daily with breakfast.   magnesium oxide 400 MG tablet Commonly known as:  MAG-OX Take 400 mg by mouth daily.   metoprolol tartrate 50 MG tablet Commonly known as:  LOPRESSOR TAKE 1 TABLET BY MOUTH TWICE DAILY. What changed:  See the new instructions.   MULTI-VITAMINS Tabs Take 1 tablet by mouth daily.   niacin 1000 MG CR tablet Commonly known as:  NIASPAN TAKE (1) TABLET BY MOUTH AT BEDTIME.   nitroGLYCERIN 0.4 MG SL tablet Commonly known as:  NITROSTAT Place 0.4 mg under the tongue every 5 (five) minutes x 3 doses as needed for chest pain.   PRADAXA 75 MG Caps capsule Generic drug:  dabigatran TAKE 1 CAPSULE BY MOUTH EVERY 12 HOURS.   ramipril 2.5 MG capsule Commonly known as:  ALTACE Take 2.5 mg by mouth at bedtime.   SYNTHROID 137 MCG tablet Generic drug:  levothyroxine Take 137 mcg by mouth daily before breakfast.   zolpidem 10 MG tablet Commonly known as:  AMBIEN Take 1 tablet (10 mg total) by mouth at bedtime as needed for sleep.      Contact information for after-discharge care    Destination    [facility] .   Specialty:  Skilled Nursing Facility Contact information: [address] [place] [phone]             No Known Allergies  Consultations:  Gastroenterology   Procedures/Studies: Dg Chest Port 1 View  Result Date: 10/17/2016 CLINICAL DATA:  SEPSIS, Per EMS pt is weak and confused. Symptoms  began last night. His nephew visited him last night and stated that he was acting right. Family called EMS today. Pt has also been having diarrhea and has stool on him EXAM: PORTABLE CHEST 1 VIEW COMPARISON:  08/22/2016 FINDINGS: Patient's left-sided transvenous pacemaker with leads to the right atrium, right ventricle, and coronary sinus. The lungs are free of focal consolidations and pleural effusions. No pulmonary edema. IMPRESSION: No evidence for acute cardiopulmonary abnormality. Electronically Signed   By: Nolon Nations M.D.   On: 10/17/2016 18:48   US Abdomen Limited Ruq  Result Date: 10/18/2016 CLINICAL DATA:  Elevated liver function tests. EXAM: ULTRASOUND ABDOMEN LIMITED RIGHT UPPER QUADRANT COMPARISON:  CT abdomen and pelvis 12/12/2013. FINDINGS: Gallbladder: No gallstones or wall thickening visualized. No sonographic Murphy sign noted by sonographer. Common bile duct: Diameter: 0.7 cm Liver: No focal lesion identified. Echogenicity is increased. Small amount of perihepatic ascites is identified. IMPRESSION: Fatty infiltration of the liver. Tiny amount of perihepatic ascites. Electronically Signed   By: Inge Rise M.D.   On: 10/18/2016 12:37       Subjective: Feeling better. No complaints.  Discharge Exam: Vitals:   10/22/16 1006 10/22/16 1404  BP: (!) 126/53 (!) 131/51  Pulse: 67 (!) 56  Resp:  20  Temp:  98.1 F (36.7 C)  SpO2:  100%  Vitals:   10/21/16 2100 10/22/16 0500 10/22/16 1006 10/22/16 1404  BP: (!) 123/59 (!) 146/45 (!) 126/53 (!) 131/51  Pulse: (!) 51 (!) 54 67 (!) 56  Resp: 20 20  20   Temp: 97.6 F (36.4 C) 98.5 F (36.9 C)  98.1 F (36.7 C)  TempSrc: Oral Oral  Oral  SpO2: 100% 98%  100%  Weight:      Height:        General: Pt is alert, awake, not in acute distress Cardiovascular: RRR, S1/S2 +, no rubs, no gallops Respiratory: CTA bilaterally, no wheezing, no rhonchi Abdominal: Soft, NT, ND, bowel sounds + Extremities: no edema, no  cyanosis    The results of significant diagnostics from this hospitalization (including imaging, microbiology, ancillary and laboratory) are listed below for reference.     Microbiology: Recent Results (from the past 240 hour(s))  Blood Culture (routine x 2)     Status: None   Collection Time: 10/17/16  6:25 PM  Result Value Ref Range Status   Specimen Description LEFT ANTECUBITAL  Final   Special Requests   Final    Blood Culture adequate volume BOTTLES DRAWN AEROBIC AND ANAEROBIC   Culture NO GROWTH 5 DAYS  Final   Report Status 10/22/2016 FINAL  Final  Blood Culture (routine x 2)     Status: None (Preliminary result)   Collection Time: 10/17/16  6:42 PM  Result Value Ref Range Status   Specimen Description BLOOD LEFT ARM  Final   Special Requests   Final    Blood Culture adequate volume BOTTLES DRAWN AEROBIC AND ANAEROBIC   Culture PENDING  Incomplete   Report Status PENDING  Incomplete  Urine Culture     Status: None   Collection Time: 10/17/16  7:33 PM  Result Value Ref Range Status   Specimen Description URINE, CLEAN CATCH  Final   Special Requests NONE  Final   Culture   Final    NO GROWTH Performed at [facility], [place] [address] [place], [place] [address]    Report Status 10/19/2016 FINAL  Final  Gastrointestinal Panel by PCR , Stool     Status: Abnormal   Collection Time: 10/19/16 11:43 AM  Result Value Ref Range Status   Campylobacter species NOT DETECTED NOT DETECTED Final   Plesimonas shigelloides NOT DETECTED NOT DETECTED Final   Salmonella species DETECTED (A) NOT DETECTED Final    Comment: RESULT CALLED TO, READ BACK BY AND VERIFIED WITH:  BRITTANY FOLEY AT 1418 10/20/16 SDR    Yersinia enterocolitica NOT DETECTED NOT DETECTED Final   Vibrio species NOT DETECTED NOT DETECTED Final   Vibrio cholerae NOT DETECTED NOT DETECTED Final   Enteroaggregative E coli (EAEC) NOT DETECTED NOT DETECTED Final   Enteropathogenic E coli (EPEC) NOT DETECTED NOT  DETECTED Final   Enterotoxigenic E coli (ETEC) NOT DETECTED NOT DETECTED Final   Shiga like toxin producing E coli (STEC) NOT DETECTED NOT DETECTED Final   Shigella/Enteroinvasive E coli (EIEC) NOT DETECTED NOT DETECTED Final   Cryptosporidium NOT DETECTED NOT DETECTED Final   Cyclospora cayetanensis NOT DETECTED NOT DETECTED Final   Entamoeba histolytica NOT DETECTED NOT DETECTED Final   Giardia lamblia NOT DETECTED NOT DETECTED Final   Adenovirus F40/41 NOT DETECTED NOT DETECTED Final   Astrovirus NOT DETECTED NOT DETECTED Final   Norovirus GI/GII NOT DETECTED NOT DETECTED Final   Rotavirus A NOT DETECTED NOT DETECTED Final   Sapovirus (I, II, IV, and V) NOT DETECTED  NOT DETECTED Final  C difficile quick scan w PCR reflex     Status: None   Collection Time: 10/19/16 11:43 AM  Result Value Ref Range Status   C Diff antigen NEGATIVE NEGATIVE Final   C Diff toxin NEGATIVE NEGATIVE Final   C Diff interpretation No C. difficile detected.  Final     Labs: BNP (last 3 results) No results for input(s): BNP in the last 8760 hours. Basic Metabolic Panel:  Recent Labs Lab 10/18/16 0623 10/19/16 0600 10/20/16 0618 10/21/16 1019 10/22/16 0626  NA 136 134* 136 137 139  K 3.5 3.3* 3.0* 3.3* 3.3*  CL 105 102 104 104 107  CO2 24 26 26 27 29   GLUCOSE 104* 67 97 114* 87  BUN 15 17 16 13 13   CREATININE 1.08 1.10 1.05 1.08 1.02  CALCIUM 7.7* 7.8* 7.9* 8.1* 8.3*   Liver Function Tests:  Recent Labs Lab 10/17/16 1825 10/19/16 0600 10/20/16 0618 10/21/16 1019 10/22/16 0626  AST 147* 396* 265* 381* 320*  ALT 67* 200* 160* 269* 278*  ALKPHOS 77 64 65 88 94  BILITOT 0.9 0.8 1.1 0.8 0.9  PROT 5.6* 5.0* 4.9* 5.5* 5.6*  ALBUMIN 2.4* 2.2* 2.1* 2.4* 2.4*   No results for input(s): LIPASE, AMYLASE in the last 168 hours. No results for input(s): AMMONIA in the last 168 hours. CBC:  Recent Labs Lab 10/17/16 1825 10/18/16 0623 10/19/16 0600 10/20/16 0618 10/21/16 1019  WBC 3.5*  2.4* 3.4* 3.5* 4.1  NEUTROABS 2.9  --  1.9  --   --   HGB 10.9* 10.5* 10.9* 10.5* 11.3*  HCT 32.4* 31.2* 32.2* 30.7* 33.2*  MCV 103.5* 102.6* 101.3* 99.7 100.0  PLT 114* 100* 114* 125* 160   Cardiac Enzymes:  Recent Labs Lab 10/17/16 1825  CKTOTAL 156   BNP: Invalid input(s): POCBNP CBG:  Recent Labs Lab 10/21/16 1235 10/21/16 1657 10/21/16 2120 10/22/16 0743 10/22/16 1127  GLUCAP 105* 147* 152* 83 120*   D-Dimer No results for input(s): DDIMER in the last 72 hours. Hgb A1c No results for input(s): HGBA1C in the last 72 hours. Lipid Profile No results for input(s): CHOL, HDL, LDLCALC, TRIG, CHOLHDL, LDLDIRECT in the last 72 hours. Thyroid function studies No results for input(s): TSH, T4TOTAL, T3FREE, THYROIDAB in the last 72 hours.  Invalid input(s): FREET3 Anemia work up No results for input(s): VITAMINB12, FOLATE, FERRITIN, TIBC, IRON, RETICCTPCT in the last 72 hours. Urinalysis    Component Value Date/Time   COLORURINE YELLOW 10/17/2016 1933   APPEARANCEUR CLEAR 10/17/2016 1933   LABSPEC 1.017 10/17/2016 1933   PHURINE 6.0 10/17/2016 1933   GLUCOSEU NEGATIVE 10/17/2016 1933   HGBUR NEGATIVE 10/17/2016 1933   BILIRUBINUR NEGATIVE 10/17/2016 1933   KETONESUR NEGATIVE 10/17/2016 1933   PROTEINUR NEGATIVE 10/17/2016 1933   UROBILINOGEN 0.2 01/05/2015 1245   NITRITE NEGATIVE 10/17/2016 1933   LEUKOCYTESUR NEGATIVE 10/17/2016 1933   Sepsis Labs Invalid input(s): PROCALCITONIN,  WBC,  LACTICIDVEN Microbiology Recent Results (from the past 240 hour(s))  Blood Culture (routine x 2)     Status: None   Collection Time: 10/17/16  6:25 PM  Result Value Ref Range Status   Specimen Description LEFT ANTECUBITAL  Final   Special Requests   Final    Blood Culture adequate volume BOTTLES DRAWN AEROBIC AND ANAEROBIC   Culture NO GROWTH 5 DAYS  Final   Report Status 10/22/2016 FINAL  Final  Blood Culture (routine x 2)     Status: None (Preliminary result)  Collection Time: 10/17/16  6:42 PM  Result Value Ref Range Status   Specimen Description BLOOD LEFT ARM  Final   Special Requests   Final    Blood Culture adequate volume BOTTLES DRAWN AEROBIC AND ANAEROBIC   Culture PENDING  Incomplete   Report Status PENDING  Incomplete  Urine Culture     Status: None   Collection Time: 10/17/16  7:33 PM  Result Value Ref Range Status   Specimen Description URINE, CLEAN CATCH  Final   Special Requests NONE  Final   Culture   Final    NO GROWTH Performed at [facility], [place] [address] [place], [place] [address]    Report Status 10/19/2016 FINAL  Final  Gastrointestinal Panel by PCR , Stool     Status: Abnormal   Collection Time: 10/19/16 11:43 AM  Result Value Ref Range Status   Campylobacter species NOT DETECTED NOT DETECTED Final   Plesimonas shigelloides NOT DETECTED NOT DETECTED Final   Salmonella species DETECTED (A) NOT DETECTED Final    Comment: RESULT CALLED TO, READ BACK BY AND VERIFIED WITH:  BRITTANY FOLEY AT 1418 10/20/16 SDR    Yersinia enterocolitica NOT DETECTED NOT DETECTED Final   Vibrio species NOT DETECTED NOT DETECTED Final   Vibrio cholerae NOT DETECTED NOT DETECTED Final   Enteroaggregative E coli (EAEC) NOT DETECTED NOT DETECTED Final   Enteropathogenic E coli (EPEC) NOT DETECTED NOT DETECTED Final   Enterotoxigenic E coli (ETEC) NOT DETECTED NOT DETECTED Final   Shiga like toxin producing E coli (STEC) NOT DETECTED NOT DETECTED Final   Shigella/Enteroinvasive E coli (EIEC) NOT DETECTED NOT DETECTED Final   Cryptosporidium NOT DETECTED NOT DETECTED Final   Cyclospora cayetanensis NOT DETECTED NOT DETECTED Final   Entamoeba histolytica NOT DETECTED NOT DETECTED Final   Giardia lamblia NOT DETECTED NOT DETECTED Final   Adenovirus F40/41 NOT DETECTED NOT DETECTED Final   Astrovirus NOT DETECTED NOT DETECTED Final   Norovirus GI/GII NOT DETECTED NOT DETECTED Final   Rotavirus A NOT DETECTED NOT DETECTED Final    Sapovirus (I, II, IV, and V) NOT DETECTED NOT DETECTED Final  C difficile quick scan w PCR reflex     Status: None   Collection Time: 10/19/16 11:43 AM  Result Value Ref Range Status   C Diff antigen NEGATIVE NEGATIVE Final   C Diff toxin NEGATIVE NEGATIVE Final   C Diff interpretation No C. difficile detected.  Final     Time coordinating discharge: Over 30 minutes  SIGNED:   Kathie Dike, MD  [facility] 10/22/2016, 2:35 PM Pager   If 7PM-7AM, please contact night-coverage [email or web address] Password [email or web address]

## 2016-10-22 NOTE — Progress Notes (Signed)
Discharge report called to [facility] staff member Danesha LPN  All questions answered to Danesha's satisfaction   Pt has decided to return home with wife.  Dr Roderic Palau aware Social worker Nira Conn was in to speak with patient and his wife regarding home care services Pt refused all services.  Discharged to home with wife.  Danesha LPN at [facility] facility notified of the change.

## 2016-10-22 NOTE — Care Management Important Message (Signed)
Important Message  Patient Details  Name: Alan Orr MRN: [id number] Date of Birth: 1945/10/23   Medicare Important Message Given:  Yes    Sherald Barge, RN 10/22/2016, 3:16 PM

## 2016-10-23 ENCOUNTER — Other Ambulatory Visit (INDEPENDENT_AMBULATORY_CARE_PROVIDER_SITE_OTHER): Payer: Self-pay | Admitting: *Deleted

## 2016-10-23 DIAGNOSIS — R7401 Elevation of levels of liver transaminase levels: Secondary | ICD-10-CM

## 2016-10-23 DIAGNOSIS — R740 Nonspecific elevation of levels of transaminase and lactic acid dehydrogenase [LDH]: Principal | ICD-10-CM

## 2016-10-23 LAB — ROCKY MTN SPOTTED FVR ABS PNL(IGG+IGM)
RMSF IGM: 0.24 {index} (ref 0.00–0.89)
RMSF IgG: POSITIVE — AB

## 2016-10-23 LAB — ANTINUCLEAR ANTIBODIES, IFA: ANTINUCLEAR ANTIBODIES, IFA: NEGATIVE

## 2016-10-23 LAB — CULTURE, BLOOD (ROUTINE X 2)
CULTURE: NO GROWTH
SPECIAL REQUESTS: ADEQUATE

## 2016-10-23 LAB — HEPATITIS B SURFACE ANTIGEN: HEP B S AG: NEGATIVE

## 2016-10-23 LAB — RMSF, IGG, IFA

## 2016-10-23 LAB — HIV ANTIBODY (ROUTINE TESTING W REFLEX): HIV SCREEN 4TH GENERATION: NONREACTIVE

## 2016-10-23 LAB — HEPATITIS C ANTIBODY: HCV AB: 0.1 {s_co_ratio} (ref 0.0–0.9)

## 2016-10-27 ENCOUNTER — Ambulatory Visit (INDEPENDENT_AMBULATORY_CARE_PROVIDER_SITE_OTHER): Payer: Medicare Other | Admitting: *Deleted

## 2016-10-27 DIAGNOSIS — R001 Bradycardia, unspecified: Secondary | ICD-10-CM

## 2016-10-27 DIAGNOSIS — Z950 Presence of cardiac pacemaker: Secondary | ICD-10-CM | POA: Diagnosis not present

## 2016-10-27 DIAGNOSIS — I5022 Chronic systolic (congestive) heart failure: Secondary | ICD-10-CM | POA: Diagnosis not present

## 2016-10-29 ENCOUNTER — Other Ambulatory Visit: Payer: Self-pay | Admitting: Cardiovascular Disease

## 2016-10-29 ENCOUNTER — Other Ambulatory Visit: Payer: Self-pay

## 2016-10-29 DIAGNOSIS — E7800 Pure hypercholesterolemia, unspecified: Secondary | ICD-10-CM

## 2016-10-29 NOTE — Telephone Encounter (Signed)
Spoke to pt wife--ok per DPR. Pt is supposed to have repeat LFT, but they have lost the order and pt wife stated she didn't know about that. I told pt I will mail her a lab slip to have LFT done at Dr. Nolon Rod office and then faxed to [place]. Pt wife stated she will call to set up appt with Dr. Gwenlyn Found after pt has lab done. She stated she is unable to do so at this time.  Mailing lab slip. Routing to Dr. Gwenlyn Found as Juluis Rainier.

## 2016-10-29 NOTE — Progress Notes (Signed)
Remote pacemaker transmission.   

## 2016-11-05 ENCOUNTER — Other Ambulatory Visit: Payer: Self-pay | Admitting: Cardiovascular Disease

## 2016-11-05 LAB — CUP PACEART REMOTE DEVICE CHECK
Battery Remaining Longevity: 87 mo
Battery Voltage: 3.02 V
Brady Statistic AP VP Percent: 56.84 %
Brady Statistic RV Percent Paced: 99.74 %
Date Time Interrogation Session: 20180814203651
Implantable Lead Implant Date: 20110224
Implantable Lead Location: 753858
Implantable Lead Location: 753859
Implantable Lead Location: 753860
Implantable Lead Model: 4196
Lead Channel Impedance Value: 437 Ohm
Lead Channel Impedance Value: 513 Ohm
Lead Channel Pacing Threshold Amplitude: 0.625 V
Lead Channel Pacing Threshold Amplitude: 0.75 V
Lead Channel Pacing Threshold Pulse Width: 0.4 ms
Lead Channel Sensing Intrinsic Amplitude: 0.75 mV
Lead Channel Sensing Intrinsic Amplitude: 10.375 mV
Lead Channel Setting Pacing Amplitude: 2 V
Lead Channel Setting Pacing Amplitude: 2.5 V
Lead Channel Setting Sensing Sensitivity: 0.9 mV
MDC IDC LEAD IMPLANT DT: 20110224
MDC IDC LEAD IMPLANT DT: 20110224
MDC IDC MSMT LEADCHNL LV IMPEDANCE VALUE: 456 Ohm
MDC IDC MSMT LEADCHNL LV IMPEDANCE VALUE: 513 Ohm
MDC IDC MSMT LEADCHNL LV IMPEDANCE VALUE: 779 Ohm
MDC IDC MSMT LEADCHNL LV PACING THRESHOLD AMPLITUDE: 0.875 V
MDC IDC MSMT LEADCHNL LV PACING THRESHOLD PULSEWIDTH: 0.4 ms
MDC IDC MSMT LEADCHNL RA IMPEDANCE VALUE: 323 Ohm
MDC IDC MSMT LEADCHNL RA IMPEDANCE VALUE: 437 Ohm
MDC IDC MSMT LEADCHNL RA PACING THRESHOLD PULSEWIDTH: 0.4 ms
MDC IDC MSMT LEADCHNL RA SENSING INTR AMPL: 0.75 mV
MDC IDC MSMT LEADCHNL RV IMPEDANCE VALUE: 399 Ohm
MDC IDC MSMT LEADCHNL RV IMPEDANCE VALUE: 418 Ohm
MDC IDC MSMT LEADCHNL RV SENSING INTR AMPL: 10.375 mV
MDC IDC PG IMPLANT DT: 20170712
MDC IDC SET LEADCHNL LV PACING AMPLITUDE: 1.5 V
MDC IDC SET LEADCHNL LV PACING PULSEWIDTH: 0.4 ms
MDC IDC SET LEADCHNL RV PACING PULSEWIDTH: 0.4 ms
MDC IDC STAT BRADY AP VS PERCENT: 0.04 %
MDC IDC STAT BRADY AS VP PERCENT: 43.1 %
MDC IDC STAT BRADY AS VS PERCENT: 0.02 %
MDC IDC STAT BRADY RA PERCENT PACED: 56.78 %

## 2016-11-10 ENCOUNTER — Encounter: Payer: Self-pay | Admitting: Cardiology

## 2016-12-03 ENCOUNTER — Other Ambulatory Visit: Payer: Self-pay | Admitting: Cardiovascular Disease

## 2016-12-10 ENCOUNTER — Emergency Department (HOSPITAL_COMMUNITY)
Admission: EM | Admit: 2016-12-10 | Discharge: 2016-12-10 | Disposition: A | Discharge status: Home / Self Care | Payer: Medicare Other | Attending: Emergency Medicine | Admitting: Emergency Medicine

## 2016-12-10 ENCOUNTER — Encounter (HOSPITAL_COMMUNITY): Payer: Self-pay | Admitting: Emergency Medicine

## 2016-12-10 DIAGNOSIS — Z79899 Other long term (current) drug therapy: Secondary | ICD-10-CM | POA: Insufficient documentation

## 2016-12-10 DIAGNOSIS — R002 Palpitations: Secondary | ICD-10-CM | POA: Insufficient documentation

## 2016-12-10 DIAGNOSIS — Z951 Presence of aortocoronary bypass graft: Secondary | ICD-10-CM | POA: Diagnosis not present

## 2016-12-10 DIAGNOSIS — Z7982 Long term (current) use of aspirin: Secondary | ICD-10-CM | POA: Diagnosis not present

## 2016-12-10 DIAGNOSIS — I2510 Atherosclerotic heart disease of native coronary artery without angina pectoris: Secondary | ICD-10-CM | POA: Insufficient documentation

## 2016-12-10 DIAGNOSIS — F410 Panic disorder [episodic paroxysmal anxiety] without agoraphobia: Secondary | ICD-10-CM

## 2016-12-10 DIAGNOSIS — I10 Essential (primary) hypertension: Secondary | ICD-10-CM | POA: Diagnosis not present

## 2016-12-10 DIAGNOSIS — Z87891 Personal history of nicotine dependence: Secondary | ICD-10-CM | POA: Diagnosis not present

## 2016-12-10 DIAGNOSIS — Z950 Presence of cardiac pacemaker: Secondary | ICD-10-CM | POA: Diagnosis not present

## 2016-12-10 DIAGNOSIS — E119 Type 2 diabetes mellitus without complications: Secondary | ICD-10-CM | POA: Insufficient documentation

## 2016-12-10 DIAGNOSIS — R251 Tremor, unspecified: Secondary | ICD-10-CM | POA: Insufficient documentation

## 2016-12-10 MED ORDER — ALPRAZOLAM 1 MG PO TABS: 1.0000 mg | ORAL_TABLET | Freq: Two times a day (BID) | ORAL | 0 refills | Status: DC

## 2016-12-10 MED ORDER — ALPRAZOLAM 0.5 MG PO TABS
1.0000 mg | ORAL_TABLET | Freq: Once | ORAL | Status: AC
  Administered 2016-12-10: 1 mg via ORAL
  Filled 2016-12-10: qty 2

## 2016-12-10 NOTE — ED Provider Notes (Signed)
[facility] DEPT Provider Note   CSN: [id number] Arrival date & time: 12/10/16  0400     History   Chief Complaint Chief Complaint  Patient presents with  . Anxiety    HPI Alan Orr is a 71 y.o. male.  Patient presents to the emergency department for evaluation of anxiety. Patient reports that he awoke from sleep approximately an hour ago and felt very jittery, shaky and nervous. He has a history of anxiety, normally takes Xanax 1 mgtwice a day. He ran out of his next 3 or 4 days ago, called his doctor's office but his doctor is on vacation. He could not get his refills. He has not had any seizures. He reports that he feels like his heart is racing but he is not experiencing any chest pain.      Past Medical History:  Diagnosis Date  . CAD (coronary artery disease)   . DM (diabetes mellitus) ([place])    type 2   . DVT (deep venous thrombosis) ([place])   . Hyperlipidemia   . Hypertension   . ICD (implantable cardioverter-defibrillator), single, in situ   . Ischemic cardiomyopathy   . PAF (paroxysmal atrial fibrillation) ([place])   . RBBB   . S/P CABG x 5     Patient Active Problem List   Diagnosis Date Noted  . Salmonella sepsis ([place]) 10/21/2016  . Salmonella gastroenteritis 10/20/2016  . Transaminasemia 10/18/2016  . Pancytopenia ([place]) 10/18/2016  . DM type 2 (diabetes mellitus, type 2) ([place]) 10/18/2016  . Thrombocytopenia ([place]) 08/22/2016  . Tick bite of right thigh 08/22/2016  . Abnormal serum protein electrophoresis 08/21/2014  . Leg wound, left 11/03/2013  . History of carotid artery disease 03/03/2013  . Cardiomyopathy, ischemic 11/08/2012  . CAD s/p CABG  11/08/2012  . Hyperlipidemia 11/08/2012  . Paroxysmal atrial fibrillation ([place]) 11/08/2012  . Ventricular tachycardia ([place]) 11/08/2012  . 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 PACEMAKER, PERMANENT 08/20/2009  . Biventricular ICD (implantable  cardioverter-defibrillator) in place 08/20/2009    Past Surgical History:  Procedure Laterality Date  . BACK SURGERY    . CARDIAC CATHETERIZATION  05/08/2009   small vessel disease  . CARDIAC DEFIBRILLATOR PLACEMENT  05/09/2009   [employer]  . CORONARY ARTERY BYPASS GRAFT  12/31/1995   LIMA to LAD,SVG to intermediate,SVG to CX,seq. svg to posterior descending and posterolateral RCA  . EP IMPLANTABLE DEVICE N/A 09/25/2015   Procedure: PPM/BIV PPM Generator Changeout;  Surgeon: Evans Lance, MD;  Location: [facility];  Service: Cardiovascular;  Laterality: N/A;  . I&D EXTREMITY Left 11/03/2013   Procedure: Left Leg Debride Ulcer, Apply Wound VAC and Theraskin;  Surgeon: Newt Minion, MD;  Location: [facility];  Service: Orthopedics;  Laterality: Left;  . TEE WITHOUT CARDIOVERSION N/A 01/11/2015   Procedure: TRANSESOPHAGEAL ECHOCARDIOGRAM (TEE);  Surgeon: Herminio Commons, MD;  [facility];  Service: Cardiology;  Laterality: N/A;       Home Medications    Prior to Admission medications   Medication Sig Start Date End Date Taking? Authorizing Provider  ALPRAZolam Duanne Moron) 1 MG tablet Take 1 tablet (1 mg total) by mouth daily as needed for anxiety or sleep. 10/22/16   Kathie Dike, MD  ALPRAZolam Duanne Moron) 1 MG tablet Take 1 tablet (1 mg total) by mouth 2 (two) times daily. 12/10/16   Orpah Greek, MD  aspirin EC 81 MG tablet Take 81 mg by mouth at bedtime.  [provider]  ciprofloxacin (CIPRO) 500 MG tablet Take 1 tablet (500 mg total) by mouth 2 (two) times daily. X 4 more days 10/22/16   Kathie Dike, MD  ezetimibe (ZETIA) 10 MG tablet TAKE 1 TABLET ONCE DAILY FOR CHOLESTEROL. 12/03/16   Lorretta Harp, MD  ferrous sulfate 325 (65 FE) MG tablet Take 325 mg by mouth daily with breakfast.    [provider]  magnesium oxide (MAG-OX) 400 MG tablet Take 400 mg by mouth daily.    [provider]  metoprolol (LOPRESSOR) 50 MG tablet  TAKE 1 TABLET BY MOUTH TWICE DAILY. Patient taking differently: TAKE 1 TABLET BY MOUTH ONCE DAILY. 06/03/16   Evans Lance, MD  Multiple Vitamin (MULTI-VITAMINS) TABS Take 1 tablet by mouth daily.    [provider]  niacin (NIASPAN) 1000 MG CR tablet TAKE (1) TABLET BY MOUTH AT BEDTIME. 10/15/16   Bayard Hugger, MD  nitroGLYCERIN (NITROSTAT) 0.4 MG SL tablet Place 0.4 mg under the tongue every 5 (five) minutes x 3 doses as needed for chest pain. 10/31/15   [provider]  PRADAXA 75 MG CAPS capsule TAKE 1 CAPSULE BY MOUTH EVERY 12 HOURS. 09/24/16   Croitoru, Mihai, MD  ramipril (ALTACE) 2.5 MG capsule Take 2.5 mg by mouth at bedtime.     [provider]  SYNTHROID 137 MCG tablet Take 137 mcg by mouth daily before breakfast.  07/20/16   [provider]  zolpidem (AMBIEN) 10 MG tablet Take 1 tablet (10 mg total) by mouth at bedtime as needed for sleep. 10/22/16   Kathie Dike, MD    Family History Family History  Problem Relation Age of Onset  . Heart attack Mother   . Stroke Father     Social History Social History  Substance Use Topics  . Smoking status: Former Smoker    Packs/day: 1.50    Years: 35.00    Types: Cigarettes    Quit date: 11/03/1995  . Smokeless tobacco: Never Used  . Alcohol use Yes     Comment: occasionally     Allergies   Patient has no known allergies.   Review of Systems Review of Systems  Cardiovascular: Positive for palpitations.  Psychiatric/Behavioral: The patient is nervous/anxious.   All other systems reviewed and are negative.    Physical Exam Updated Vital Signs BP 118/60   Pulse (!) 57   Temp 97.9 F (36.6 C)   Resp 16   Ht 6\' 2"  (1.88 m)   Wt 72.6 kg (160 lb)   SpO2 98%   BMI 20.54 kg/m   Physical Exam  Constitutional: He is oriented to person, place, and time. He appears well-developed and well-nourished. No distress.  HENT:  Head: Normocephalic and atraumatic.  Right Ear: Hearing normal.    Left Ear: Hearing normal.  Nose: Nose normal.  Mouth/Throat: Oropharynx is clear and moist and mucous membranes are normal.  Eyes: Pupils are equal, round, and reactive to light. Conjunctivae and EOM are normal.  Neck: Normal range of motion. Neck supple.  Cardiovascular: Regular rhythm, S1 normal and S2 normal.  Exam reveals no gallop and no friction rub.   No murmur heard. Pulmonary/Chest: Effort normal and breath sounds normal. No respiratory distress. He exhibits no tenderness.  Abdominal: Soft. Normal appearance and bowel sounds are normal. There is no hepatosplenomegaly. There is no tenderness. There is no rebound, no guarding, no tenderness at McBurney's point and negative Murphy's sign. No hernia.  Musculoskeletal: Normal  range of motion.  Neurological: He is alert and oriented to person, place, and time. He has normal strength. No cranial nerve deficit or sensory deficit. Coordination normal. GCS eye subscore is 4. GCS verbal subscore is 5. GCS motor subscore is 6.  Fine shaking tremor of upper extremities  Skin: Skin is warm, dry and intact. No rash noted. No cyanosis.  Psychiatric: He has a normal mood and affect. His speech is normal and behavior is normal. Thought content normal.  Nursing note and vitals reviewed.    ED Treatments / Results  Labs (all labs ordered are listed, but only abnormal results are displayed) Labs Reviewed - No data to display  EKG  EKG Interpretation  Date/Time:  Thursday December 10 2016 04:27:15 EDT Ventricular Rate:  67 PR Interval:    QRS Duration: 123 QT Interval:  471 QTC Calculation: 498 R Axis:   -72 Text Interpretation:  Atrial-ventricular dual-paced rhythm No further analysis attempted due to paced rhythm Confirmed by Orpah Greek [phone]) on 12/10/2016 4:35:15 AM       Radiology No results found.  Procedures Procedures (including critical care time)  Medications Ordered in ED Medications  ALPRAZolam (XANAX)  tablet 1 mg (1 mg Oral Given 12/10/16 0431)     Initial Impression / Assessment and Plan / ED Course  I have reviewed the triage vital signs and the nursing notes.  Pertinent labs & imaging results that were available during my care of the patient were reviewed by me and considered in my medical decision making (see chart for details).     Patient presents to the emergency department for evaluation of anxiety. Patient has been out of his Xanax for several days. Reports that his primary care physician is on vacation and he has been unable to get a refill. He did report palpitations, but no arrhythmia is noted here in the ER. EKG reveals functional pacemaker, no change from previous. He was extremely anxious at arrival, hypertensive, shaking. He is not experiencing any chest pain. This was consistent with severe anxiety. He was given Xanax and symptoms have resolved.  Final Clinical Impressions(s) / ED Diagnoses   Final diagnoses:  Anxiety attack    New Prescriptions New Prescriptions   ALPRAZOLAM (XANAX) 1 MG TABLET    Take 1 tablet (1 mg total) by mouth 2 (two) times daily.     Orpah Greek, MD 12/10/16 [phone]

## 2016-12-10 NOTE — ED Triage Notes (Signed)
Pt c/o waking up at 3am real nervous. Pt states he has been out of his anxiety meds 3-4 days.

## 2016-12-18 DIAGNOSIS — M1991 Primary osteoarthritis, unspecified site: Secondary | ICD-10-CM | POA: Diagnosis not present

## 2016-12-18 DIAGNOSIS — Z6821 Body mass index (BMI) 21.0-21.9, adult: Secondary | ICD-10-CM | POA: Diagnosis not present

## 2016-12-18 DIAGNOSIS — G894 Chronic pain syndrome: Secondary | ICD-10-CM | POA: Diagnosis not present

## 2017-01-04 ENCOUNTER — Other Ambulatory Visit: Payer: Self-pay | Admitting: Cardiovascular Disease

## 2017-01-04 NOTE — Telephone Encounter (Signed)
REFILL 

## 2017-01-26 ENCOUNTER — Telehealth: Payer: Self-pay | Admitting: Cardiology

## 2017-01-26 ENCOUNTER — Encounter: Payer: Medicare Other | Admitting: *Deleted

## 2017-01-26 NOTE — Telephone Encounter (Signed)
Attempted to confirm remote transmission with pt. No answer and was unable to leave a message.   

## 2017-01-28 ENCOUNTER — Encounter: Payer: Self-pay | Admitting: Cardiology

## 2017-02-09 ENCOUNTER — Telehealth: Payer: Self-pay | Admitting: *Deleted

## 2017-02-09 NOTE — Telephone Encounter (Signed)
Nephew calling in regards to letter about missed remote transmission. Monitor is at pt's wife's home, unable to get the monitor at this time. They will try to get the monitor to where the patient is currently staying and send transmission at that time. Unsure of timeframe. I advised Remo Lipps to call when they sent the transmission to make sure we scheduled and processed it. He verbalizes understanding. Direct # given to [facility].

## 2017-02-10 ENCOUNTER — Ambulatory Visit (INDEPENDENT_AMBULATORY_CARE_PROVIDER_SITE_OTHER): Payer: Medicare Other | Admitting: *Deleted

## 2017-02-10 DIAGNOSIS — I255 Ischemic cardiomyopathy: Secondary | ICD-10-CM | POA: Diagnosis not present

## 2017-02-11 NOTE — Progress Notes (Signed)
Remote pacemaker transmission.   

## 2017-02-12 ENCOUNTER — Encounter: Payer: Self-pay | Admitting: Cardiology

## 2017-02-12 LAB — CUP PACEART REMOTE DEVICE CHECK
Battery Remaining Longevity: 78 mo
Brady Statistic AP VP Percent: 62.05 %
Brady Statistic AP VS Percent: 0.03 %
Brady Statistic AS VS Percent: 0.02 %
Brady Statistic RV Percent Paced: 99.83 %
Date Time Interrogation Session: 20181129034530
Implantable Lead Implant Date: 20110224
Implantable Lead Implant Date: 20110224
Implantable Lead Implant Date: 20110224
Implantable Lead Location: 753858
Implantable Lead Location: 753859
Implantable Lead Model: 6947
Implantable Pulse Generator Implant Date: 20170712
Lead Channel Impedance Value: 475 Ohm
Lead Channel Impedance Value: 494 Ohm
Lead Channel Impedance Value: 513 Ohm
Lead Channel Impedance Value: 551 Ohm
Lead Channel Impedance Value: 627 Ohm
Lead Channel Impedance Value: 646 Ohm
Lead Channel Pacing Threshold Amplitude: 0.625 V
Lead Channel Pacing Threshold Amplitude: 0.75 V
Lead Channel Sensing Intrinsic Amplitude: 10.625 mV
Lead Channel Setting Pacing Amplitude: 2.5 V
Lead Channel Setting Pacing Pulse Width: 0.4 ms
MDC IDC LEAD LOCATION: 753860
MDC IDC MSMT BATTERY VOLTAGE: 3.02 V
MDC IDC MSMT LEADCHNL LV IMPEDANCE VALUE: 1064 Ohm
MDC IDC MSMT LEADCHNL LV IMPEDANCE VALUE: 722 Ohm
MDC IDC MSMT LEADCHNL LV PACING THRESHOLD PULSEWIDTH: 0.4 ms
MDC IDC MSMT LEADCHNL RA IMPEDANCE VALUE: 399 Ohm
MDC IDC MSMT LEADCHNL RA PACING THRESHOLD AMPLITUDE: 0.75 V
MDC IDC MSMT LEADCHNL RA PACING THRESHOLD PULSEWIDTH: 0.4 ms
MDC IDC MSMT LEADCHNL RA SENSING INTR AMPL: 1.375 mV
MDC IDC MSMT LEADCHNL RA SENSING INTR AMPL: 1.375 mV
MDC IDC MSMT LEADCHNL RV PACING THRESHOLD PULSEWIDTH: 0.4 ms
MDC IDC MSMT LEADCHNL RV SENSING INTR AMPL: 10.625 mV
MDC IDC SET LEADCHNL LV PACING AMPLITUDE: 1.25 V
MDC IDC SET LEADCHNL RA PACING AMPLITUDE: 2 V
MDC IDC SET LEADCHNL RV PACING PULSEWIDTH: 0.4 ms
MDC IDC SET LEADCHNL RV SENSING SENSITIVITY: 0.9 mV
MDC IDC STAT BRADY AS VP PERCENT: 37.9 %
MDC IDC STAT BRADY RA PERCENT PACED: 62 %

## 2017-03-19 DIAGNOSIS — I4891 Unspecified atrial fibrillation: Secondary | ICD-10-CM | POA: Diagnosis not present

## 2017-03-19 DIAGNOSIS — J329 Chronic sinusitis, unspecified: Secondary | ICD-10-CM | POA: Diagnosis not present

## 2017-03-19 DIAGNOSIS — I87002 Postthrombotic syndrome without complications of left lower extremity: Secondary | ICD-10-CM | POA: Diagnosis not present

## 2017-03-19 DIAGNOSIS — J042 Acute laryngotracheitis: Secondary | ICD-10-CM | POA: Diagnosis not present

## 2017-03-19 DIAGNOSIS — Z6824 Body mass index (BMI) 24.0-24.9, adult: Secondary | ICD-10-CM | POA: Diagnosis not present

## 2017-03-19 DIAGNOSIS — G894 Chronic pain syndrome: Secondary | ICD-10-CM | POA: Diagnosis not present

## 2017-03-19 DIAGNOSIS — R600 Localized edema: Secondary | ICD-10-CM | POA: Diagnosis not present

## 2017-05-12 ENCOUNTER — Telehealth: Payer: Self-pay | Admitting: Cardiology

## 2017-05-12 ENCOUNTER — Telehealth: Payer: Self-pay

## 2017-05-12 ENCOUNTER — Ambulatory Visit (INDEPENDENT_AMBULATORY_CARE_PROVIDER_SITE_OTHER): Payer: Medicare Other | Admitting: *Deleted

## 2017-05-12 DIAGNOSIS — I255 Ischemic cardiomyopathy: Secondary | ICD-10-CM

## 2017-05-12 NOTE — Progress Notes (Signed)
Remote pacemaker transmission.   

## 2017-05-12 NOTE — Telephone Encounter (Signed)
Pt having trouble sending transmission, informed pt that I would have someone from [employer] reach out to help trouble shoot the monitor pt voiced understanding.

## 2017-05-12 NOTE — Telephone Encounter (Signed)
Spoke with pt and reminded pt of remote transmission that is due today. Pt verbalized understanding.   

## 2017-05-13 ENCOUNTER — Encounter: Payer: Self-pay | Admitting: Cardiology

## 2017-05-18 DIAGNOSIS — Z1389 Encounter for screening for other disorder: Secondary | ICD-10-CM | POA: Diagnosis not present

## 2017-05-18 DIAGNOSIS — J329 Chronic sinusitis, unspecified: Secondary | ICD-10-CM | POA: Diagnosis not present

## 2017-05-18 DIAGNOSIS — G894 Chronic pain syndrome: Secondary | ICD-10-CM | POA: Diagnosis not present

## 2017-05-29 LAB — CUP PACEART REMOTE DEVICE CHECK
Brady Statistic AP VP Percent: 48.29 %
Brady Statistic AS VP Percent: 51.64 %
Brady Statistic AS VS Percent: 0.03 %
Date Time Interrogation Session: 20190228014445
Implantable Lead Implant Date: 20110224
Implantable Lead Implant Date: 20110224
Implantable Lead Implant Date: 20110224
Implantable Lead Location: 753858
Implantable Lead Location: 753859
Implantable Lead Model: 4196
Implantable Lead Model: 6947
Implantable Pulse Generator Implant Date: 20170712
Lead Channel Impedance Value: 1064 Ohm
Lead Channel Impedance Value: 456 Ohm
Lead Channel Impedance Value: 513 Ohm
Lead Channel Impedance Value: 570 Ohm
Lead Channel Impedance Value: 646 Ohm
Lead Channel Impedance Value: 722 Ohm
Lead Channel Pacing Threshold Amplitude: 0.75 V
Lead Channel Pacing Threshold Amplitude: 0.875 V
Lead Channel Pacing Threshold Pulse Width: 0.4 ms
Lead Channel Setting Pacing Amplitude: 2 V
Lead Channel Setting Pacing Amplitude: 2.5 V
Lead Channel Setting Pacing Pulse Width: 0.4 ms
Lead Channel Setting Pacing Pulse Width: 0.4 ms
MDC IDC LEAD LOCATION: 753860
MDC IDC MSMT BATTERY REMAINING LONGEVITY: 77 mo
MDC IDC MSMT BATTERY VOLTAGE: 3.02 V
MDC IDC MSMT LEADCHNL LV IMPEDANCE VALUE: 646 Ohm
MDC IDC MSMT LEADCHNL LV PACING THRESHOLD PULSEWIDTH: 0.4 ms
MDC IDC MSMT LEADCHNL RA IMPEDANCE VALUE: 361 Ohm
MDC IDC MSMT LEADCHNL RA SENSING INTR AMPL: 1.25 mV
MDC IDC MSMT LEADCHNL RA SENSING INTR AMPL: 1.25 mV
MDC IDC MSMT LEADCHNL RV IMPEDANCE VALUE: 532 Ohm
MDC IDC MSMT LEADCHNL RV PACING THRESHOLD AMPLITUDE: 0.625 V
MDC IDC MSMT LEADCHNL RV PACING THRESHOLD PULSEWIDTH: 0.4 ms
MDC IDC MSMT LEADCHNL RV SENSING INTR AMPL: 13.625 mV
MDC IDC MSMT LEADCHNL RV SENSING INTR AMPL: 13.625 mV
MDC IDC SET LEADCHNL LV PACING AMPLITUDE: 1.5 V
MDC IDC SET LEADCHNL RV SENSING SENSITIVITY: 0.9 mV
MDC IDC STAT BRADY AP VS PERCENT: 0.04 %
MDC IDC STAT BRADY RA PERCENT PACED: 48.25 %
MDC IDC STAT BRADY RV PERCENT PACED: 99.79 %

## 2017-06-18 ENCOUNTER — Other Ambulatory Visit: Payer: Self-pay

## 2017-06-18 ENCOUNTER — Emergency Department (HOSPITAL_COMMUNITY)
Admission: EM | Admit: 2017-06-18 | Discharge: 2017-06-18 | Disposition: A | Discharge status: Home / Self Care | Payer: Medicare Other | Attending: Emergency Medicine | Admitting: Emergency Medicine

## 2017-06-18 ENCOUNTER — Encounter (HOSPITAL_COMMUNITY): Payer: Self-pay | Admitting: Emergency Medicine

## 2017-06-18 DIAGNOSIS — Z950 Presence of cardiac pacemaker: Secondary | ICD-10-CM | POA: Insufficient documentation

## 2017-06-18 DIAGNOSIS — Z86718 Personal history of other venous thrombosis and embolism: Secondary | ICD-10-CM | POA: Insufficient documentation

## 2017-06-18 DIAGNOSIS — Z7982 Long term (current) use of aspirin: Secondary | ICD-10-CM | POA: Insufficient documentation

## 2017-06-18 DIAGNOSIS — I2510 Atherosclerotic heart disease of native coronary artery without angina pectoris: Secondary | ICD-10-CM | POA: Diagnosis not present

## 2017-06-18 DIAGNOSIS — E785 Hyperlipidemia, unspecified: Secondary | ICD-10-CM | POA: Insufficient documentation

## 2017-06-18 DIAGNOSIS — I480 Paroxysmal atrial fibrillation: Secondary | ICD-10-CM | POA: Insufficient documentation

## 2017-06-18 DIAGNOSIS — Y929 Unspecified place or not applicable: Secondary | ICD-10-CM | POA: Insufficient documentation

## 2017-06-18 DIAGNOSIS — Y998 Other external cause status: Secondary | ICD-10-CM | POA: Diagnosis not present

## 2017-06-18 DIAGNOSIS — Z7902 Long term (current) use of antithrombotics/antiplatelets: Secondary | ICD-10-CM | POA: Diagnosis not present

## 2017-06-18 DIAGNOSIS — W010XXA Fall on same level from slipping, tripping and stumbling without subsequent striking against object, initial encounter: Secondary | ICD-10-CM | POA: Insufficient documentation

## 2017-06-18 DIAGNOSIS — Y939 Activity, unspecified: Secondary | ICD-10-CM | POA: Insufficient documentation

## 2017-06-18 DIAGNOSIS — N183 Chronic kidney disease, stage 3 (moderate): Secondary | ICD-10-CM | POA: Insufficient documentation

## 2017-06-18 DIAGNOSIS — Z87891 Personal history of nicotine dependence: Secondary | ICD-10-CM | POA: Insufficient documentation

## 2017-06-18 DIAGNOSIS — I129 Hypertensive chronic kidney disease with stage 1 through stage 4 chronic kidney disease, or unspecified chronic kidney disease: Secondary | ICD-10-CM | POA: Insufficient documentation

## 2017-06-18 DIAGNOSIS — W19XXXA Unspecified fall, initial encounter: Secondary | ICD-10-CM

## 2017-06-18 DIAGNOSIS — Z951 Presence of aortocoronary bypass graft: Secondary | ICD-10-CM | POA: Insufficient documentation

## 2017-06-18 DIAGNOSIS — M79652 Pain in left thigh: Secondary | ICD-10-CM | POA: Insufficient documentation

## 2017-06-18 DIAGNOSIS — M79651 Pain in right thigh: Secondary | ICD-10-CM | POA: Diagnosis not present

## 2017-06-18 DIAGNOSIS — E1122 Type 2 diabetes mellitus with diabetic chronic kidney disease: Secondary | ICD-10-CM | POA: Diagnosis not present

## 2017-06-18 DIAGNOSIS — I255 Ischemic cardiomyopathy: Secondary | ICD-10-CM | POA: Diagnosis not present

## 2017-06-18 DIAGNOSIS — Z79899 Other long term (current) drug therapy: Secondary | ICD-10-CM | POA: Diagnosis not present

## 2017-06-18 NOTE — ED Triage Notes (Signed)
Pt reports RT upper leg pain x 1 week after fall. Pt ambulatory.

## 2017-06-18 NOTE — ED Provider Notes (Signed)
[facility] EMERGENCY DEPARTMENT Provider Note   CSN: [id number] Arrival date & time: 06/18/17  1228     History   Chief Complaint Chief Complaint  Patient presents with  . Leg Pain    HPI Alan Orr is a 72 y.o. male with history of DM, DVT, HLD, HTN, PAF status post pacemaker on Pradaxa presents today for evaluation of intermittent bilateral thigh pain 2 weeks after fall.  Patient states that approximately 2 weeks ago he was ambulating outside on uneven terrain when he tripped and fell forward, landing in a prone position.  He denies head injury or loss of consciousness.  He denies headaches.  He notes that since then he has experienced intermittent aching anterior thigh pain, usually with position changes from laying to standing.  He denies numbness, tingling, weakness, back pain, hip pain, knee pain.  He denies bowel or bladder incontinence.  He notes a superficial abrasion to the right knee anteriorly but otherwise no pain.  He has not tried anything for his symptoms.  He is on Pradaxa and has not missed any doses of this medication.  He has been ambulatory since the fall without difficulty.  There was no prodrome leading up to the fall.  The history is provided by the patient.    Past Medical History:  Diagnosis Date  . CAD (coronary artery disease)   . DM (diabetes mellitus) ([place])    type 2   . DVT (deep venous thrombosis) ([place])   . Hyperlipidemia   . Hypertension   . ICD (implantable cardioverter-defibrillator), single, in situ   . Ischemic cardiomyopathy   . PAF (paroxysmal atrial fibrillation) ([place])   . RBBB   . S/P CABG x 5     Patient Active Problem List   Diagnosis Date Noted  . Salmonella sepsis ([place]) 10/21/2016  . Salmonella gastroenteritis 10/20/2016  . Transaminasemia 10/18/2016  . Pancytopenia ([place]) 10/18/2016  . DM type 2 (diabetes mellitus, type 2) ([place]) 10/18/2016  . Thrombocytopenia ([place]) 08/22/2016  . Tick bite of right thigh 08/22/2016  .  Abnormal serum protein electrophoresis 08/21/2014  . Leg wound, left 11/03/2013  . History of carotid artery disease 03/03/2013  . Cardiomyopathy, ischemic 11/08/2012  . CAD s/p CABG  11/08/2012  . Hyperlipidemia 11/08/2012  . Paroxysmal atrial fibrillation ([place]) 11/08/2012  . Ventricular tachycardia ([place]) 11/08/2012  . Type 2 diabetes mellitus with stage 3 chronic kidney disease ([place]) 08/20/2009  . PACEMAKER, PERMANENT 08/20/2009  . Biventricular ICD (implantable cardioverter-defibrillator) in place 08/20/2009    Past Surgical History:  Procedure Laterality Date  . BACK SURGERY    . CARDIAC CATHETERIZATION  05/08/2009   small vessel disease  . CARDIAC DEFIBRILLATOR PLACEMENT  05/09/2009   [employer]  . CORONARY ARTERY BYPASS GRAFT  12/31/1995   LIMA to LAD,SVG to intermediate,SVG to CX,seq. svg to posterior descending and posterolateral RCA  . EP IMPLANTABLE DEVICE N/A 09/25/2015   Procedure: PPM/BIV PPM Generator Changeout;  Surgeon: Evans Lance, MD;  Location: [facility];  Service: Cardiovascular;  Laterality: N/A;  . I&D EXTREMITY Left 11/03/2013   Procedure: Left Leg Debride Ulcer, Apply Wound VAC and Theraskin;  Surgeon: Newt Minion, MD;  Location: [facility];  Service: Orthopedics;  Laterality: Left;  . TEE WITHOUT CARDIOVERSION N/A 01/11/2015   Procedure: TRANSESOPHAGEAL ECHOCARDIOGRAM (TEE);  Surgeon: Herminio Commons, MD;  [facility];  Service: Cardiology;  Laterality: N/A;        Home Medications  Prior to Admission medications   Medication Sig Start Date End Date Taking? Authorizing Provider  ALPRAZolam Duanne Moron) 1 MG tablet Take 1 tablet (1 mg total) by mouth daily as needed for anxiety or sleep. 10/22/16   Kathie Dike, MD  ALPRAZolam Duanne Moron) 1 MG tablet Take 1 tablet (1 mg total) by mouth 2 (two) times daily. 12/10/16   Orpah Greek, MD  aspirin EC 81 MG tablet Take 81 mg by mouth at bedtime.    [provider]    ciprofloxacin (CIPRO) 500 MG tablet Take 1 tablet (500 mg total) by mouth 2 (two) times daily. X 4 more days 10/22/16   Kathie Dike, MD  ezetimibe (ZETIA) 10 MG tablet TAKE 1 TABLET ONCE DAILY FOR CHOLESTEROL. 01/04/17   Lorretta Harp, MD  ferrous sulfate 325 (65 FE) MG tablet Take 325 mg by mouth daily with breakfast.    [provider]  magnesium oxide (MAG-OX) 400 MG tablet Take 400 mg by mouth daily.    [provider]  metoprolol (LOPRESSOR) 50 MG tablet TAKE 1 TABLET BY MOUTH TWICE DAILY. Patient taking differently: TAKE 1 TABLET BY MOUTH ONCE DAILY. 06/03/16   Evans Lance, MD  Multiple Vitamin (MULTI-VITAMINS) TABS Take 1 tablet by mouth daily.    [provider]  niacin (NIASPAN) 1000 MG CR tablet TAKE (1) TABLET BY MOUTH AT BEDTIME. 10/15/16   Bayard Hugger, MD  nitroGLYCERIN (NITROSTAT) 0.4 MG SL tablet Place 0.4 mg under the tongue every 5 (five) minutes x 3 doses as needed for chest pain. 10/31/15   [provider]  PRADAXA 75 MG CAPS capsule TAKE 1 CAPSULE BY MOUTH EVERY 12 HOURS. 09/24/16   Croitoru, Mihai, MD  ramipril (ALTACE) 2.5 MG capsule Take 2.5 mg by mouth at bedtime.     [provider]  SYNTHROID 137 MCG tablet Take 137 mcg by mouth daily before breakfast.  07/20/16   [provider]  zolpidem (AMBIEN) 10 MG tablet Take 1 tablet (10 mg total) by mouth at bedtime as needed for sleep. 10/22/16   Kathie Dike, MD    Family History Family History  Problem Relation Age of Onset  . Heart attack Mother   . Stroke Father     Social History Social History   Tobacco Use  . Smoking status: Former Smoker    Packs/day: 1.50    Years: 35.00    Pack years: 52.50    Types: Cigarettes    Last attempt to quit: 11/03/1995    Years since quitting: 21.6  . Smokeless tobacco: Never Used  Substance Use Topics  . Alcohol use: Yes    Comment: occasionally  . Drug use: No     Allergies   Patient has no known  allergies.   Review of Systems Review of Systems  Constitutional: Negative for chills and fever.  Eyes: Negative for visual disturbance.  Respiratory: Negative for shortness of breath.   Cardiovascular: Negative for chest pain.  Gastrointestinal: Negative for abdominal pain, nausea and vomiting.  Musculoskeletal: Positive for myalgias. Negative for arthralgias, back pain and gait problem.  Neurological: Negative for syncope, weakness, numbness and headaches.  All other systems reviewed and are negative.    Physical Exam Updated Vital Signs BP (!) 146/86 (BP Location: Right Arm)   Pulse 71   Temp 97.7 F (36.5 C) (Oral)   Resp 17   Ht 6\' 2"  (1.88 m)   Wt 88.5 kg (195 lb)   SpO2 97%  BMI 25.04 kg/m   Physical Exam  Constitutional: He is oriented to person, place, and time. He appears well-developed and well-nourished. No distress.  HENT:  Head: Normocephalic and atraumatic.  Eyes: Conjunctivae are normal. Right eye exhibits no discharge. Left eye exhibits no discharge.  Neck: No JVD present. No tracheal deviation present.  Cardiovascular: Normal rate, regular rhythm, normal heart sounds and intact distal pulses.  2+ radial and DP/PT pulses bl, negative Homan's bl, no LE edema  Pulmonary/Chest: Effort normal and breath sounds normal.  Abdominal: Soft. Bowel sounds are normal. He exhibits no distension. There is no tenderness.  Musculoskeletal: Normal range of motion. He exhibits no edema or tenderness.  No midline spine TTP, no paraspinal muscle tenderness, no deformity, crepitus, or step-off noted. 5/5 strength of BUE and BLE major muscle groups.  He has a superficial abrasion which is well healing to the anterior aspect of the right knee.  No surrounding erythema, fluctuance, or induration.  No abnormal drainage.  No ligamentous laxity or varus or valgus instability noted.  No tenderness to palpation of the joints of the bilateral lower extremities.  Pelvis appears to be  stable on examination, no leg length discrepancy.  There is ecchymosis distal to the right knee extending down into the shin but no tenderness to palpation in the ecchymosis appears to be old/resolving.  Neurological: He is alert and oriented to person, place, and time. No cranial nerve deficit or sensory deficit. He exhibits normal muscle tone.  Fluent speech with no evidence of dysarthria or aphasia.  No facial droop.  Sensation intact to soft touch of face and extremities bilaterally.  Normal gait and balance, able to Heel Walk and Toe Walk without difficulty.  Cranial nerves II through XII tested and intact.  Skin: Skin is warm and dry. No erythema.  Psychiatric: He has a normal mood and affect. His behavior is normal.  Nursing note and vitals reviewed.    ED Treatments / Results  Labs (all labs ordered are listed, but only abnormal results are displayed) Labs Reviewed - No data to display  EKG None  Radiology No results found.  Procedures Procedures (including critical care time)  Medications Ordered in ED Medications - No data to display   Initial Impression / Assessment and Plan / ED Course  I have reviewed the triage vital signs and the nursing notes.  Pertinent labs & imaging results that were available during my care of the patient were reviewed by me and considered in my medical decision making (see chart for details).     Patient presents with complaint of bilateral thigh pain, right worse than left 2 weeks status post mechanical fall.  He is afebrile, vital signs are stable.  He is neurovascularly intact.  No head injury or loss of consciousness no complaint of neck pain or low back pain.  Pelvis appears to be stable on examination.  He has no tenderness to palpation on my examination.  He is ambulatory without difficulty.  I have a low suspicion of fracture or DVT given patient's presentation and examination.  Suspect possible soft tissue injury or contusion.   Compartments are soft and I doubt compartment syndrome.  No further emergent workup required at this time.  Discussed pain control with Tylenol and use of his cane to help ambulate.  He is stable for discharge home with follow-up with his primary care physician.  Discussed indications for return to the ED. Pt and patient's wife verbalized understanding of and agreement  with plan and patient is safe for discharge home at this time.  Discussed with Dr. Wilson Singer who agrees with assessment and plan at this time.  Final Clinical Impressions(s) / ED Diagnoses   Final diagnoses:  Pain in both thighs  Fall, initial encounter    ED Discharge Orders    None       Debroah Baller 06/18/17 1347    Virgel Manifold, MD 06/21/17 [phone]

## 2017-06-18 NOTE — Discharge Instructions (Signed)
You may take [id number] mg of Tylenol every 6 hours as needed for pain.  Do not exceed 4000 mg of Tylenol daily.  Apply ice or heat for comfort.  Do some gentle stretching throughout the day to avoid muscle stiffness.  Use your cane to help you get around.  Follow-up with your primary care physician for reevaluation of your symptoms.  Return to the emergency department if any concerning signs or symptoms develop such as fevers, loss of pulses, worsening pain.

## 2017-06-21 ENCOUNTER — Other Ambulatory Visit: Payer: Self-pay

## 2017-08-11 ENCOUNTER — Telehealth: Payer: Self-pay | Admitting: Internal Medicine

## 2017-08-11 ENCOUNTER — Ambulatory Visit (INDEPENDENT_AMBULATORY_CARE_PROVIDER_SITE_OTHER): Payer: Medicare Other | Admitting: *Deleted

## 2017-08-11 DIAGNOSIS — I255 Ischemic cardiomyopathy: Secondary | ICD-10-CM | POA: Diagnosis not present

## 2017-08-11 DIAGNOSIS — I472 Ventricular tachycardia, unspecified: Secondary | ICD-10-CM

## 2017-08-11 NOTE — Telephone Encounter (Signed)
New message     Pt came in today about his remote check. He states that his monitor wire is broken, please call.

## 2017-08-11 NOTE — Progress Notes (Signed)
No transmission  

## 2017-08-11 NOTE — Telephone Encounter (Signed)
Attempted to contact patient regarding home monitor. Unable to successfully leave VM on any of listed numbers. Patient walked in 5/29 with concerns that home monitor had a broken wire. Successful transmission received in carelink on 5/29 since patient was in office.

## 2017-08-12 NOTE — Progress Notes (Signed)
Remote pacemaker transmission.   

## 2017-08-12 NOTE — Telephone Encounter (Signed)
Spoke with patient regarding home monitor.  Power cord on monitor base is frayed and patient has to hold it in place in order to send transmission.  Advised I will submit electronic request for [employer] to contact patient to offer monitor replacement.  Patient is agreeable and is aware to call us back if he has not heard from [employer] in the next week or so.  Request placed via [employer] website.

## 2017-08-13 LAB — CUP PACEART REMOTE DEVICE CHECK
Battery Remaining Longevity: 79 mo
Brady Statistic AP VP Percent: 48.3 %
Brady Statistic AS VP Percent: 51.64 %
Brady Statistic AS VS Percent: 0.03 %
Date Time Interrogation Session: 20190529230245
Implantable Lead Implant Date: 20110224
Implantable Lead Implant Date: 20110224
Implantable Lead Location: 753858
Implantable Lead Location: 753860
Implantable Lead Model: 5076
Implantable Lead Model: 6947
Lead Channel Impedance Value: 1026 Ohm
Lead Channel Impedance Value: 456 Ohm
Lead Channel Impedance Value: 494 Ohm
Lead Channel Impedance Value: 627 Ohm
Lead Channel Pacing Threshold Amplitude: 0.625 V
Lead Channel Pacing Threshold Amplitude: 0.875 V
Lead Channel Pacing Threshold Amplitude: 0.875 V
Lead Channel Pacing Threshold Pulse Width: 0.4 ms
Lead Channel Sensing Intrinsic Amplitude: 1.5 mV
Lead Channel Sensing Intrinsic Amplitude: 10.5 mV
Lead Channel Setting Pacing Amplitude: 1.5 V
Lead Channel Setting Pacing Amplitude: 2 V
Lead Channel Setting Pacing Pulse Width: 0.4 ms
MDC IDC LEAD IMPLANT DT: 20110224
MDC IDC LEAD LOCATION: 753859
MDC IDC MSMT BATTERY VOLTAGE: 3.02 V
MDC IDC MSMT LEADCHNL LV IMPEDANCE VALUE: 532 Ohm
MDC IDC MSMT LEADCHNL LV IMPEDANCE VALUE: 608 Ohm
MDC IDC MSMT LEADCHNL LV IMPEDANCE VALUE: 703 Ohm
MDC IDC MSMT LEADCHNL RA IMPEDANCE VALUE: 342 Ohm
MDC IDC MSMT LEADCHNL RA PACING THRESHOLD PULSEWIDTH: 0.4 ms
MDC IDC MSMT LEADCHNL RA SENSING INTR AMPL: 1.5 mV
MDC IDC MSMT LEADCHNL RV IMPEDANCE VALUE: 532 Ohm
MDC IDC MSMT LEADCHNL RV PACING THRESHOLD PULSEWIDTH: 0.4 ms
MDC IDC MSMT LEADCHNL RV SENSING INTR AMPL: 10.5 mV
MDC IDC PG IMPLANT DT: 20170712
MDC IDC SET LEADCHNL RV PACING AMPLITUDE: 2.5 V
MDC IDC SET LEADCHNL RV PACING PULSEWIDTH: 0.4 ms
MDC IDC SET LEADCHNL RV SENSING SENSITIVITY: 0.9 mV
MDC IDC STAT BRADY AP VS PERCENT: 0.03 %
MDC IDC STAT BRADY RA PERCENT PACED: 48.31 %
MDC IDC STAT BRADY RV PERCENT PACED: 99.91 %

## 2017-08-23 DIAGNOSIS — I10 Essential (primary) hypertension: Secondary | ICD-10-CM | POA: Diagnosis not present

## 2017-08-23 DIAGNOSIS — Z1389 Encounter for screening for other disorder: Secondary | ICD-10-CM | POA: Diagnosis not present

## 2017-08-23 DIAGNOSIS — Z0001 Encounter for general adult medical examination with abnormal findings: Secondary | ICD-10-CM | POA: Diagnosis not present

## 2017-08-23 DIAGNOSIS — N183 Chronic kidney disease, stage 3 (moderate): Secondary | ICD-10-CM | POA: Diagnosis not present

## 2017-09-29 ENCOUNTER — Other Ambulatory Visit: Payer: Self-pay | Admitting: Internal Medicine

## 2017-10-04 ENCOUNTER — Emergency Department (HOSPITAL_COMMUNITY)
Admission: EM | Admit: 2017-10-04 | Discharge: 2017-10-04 | Disposition: A | Discharge status: Home / Self Care | Payer: Medicare Other | Attending: Emergency Medicine | Admitting: Emergency Medicine

## 2017-10-04 ENCOUNTER — Encounter (HOSPITAL_COMMUNITY): Payer: Self-pay | Admitting: Emergency Medicine

## 2017-10-04 ENCOUNTER — Emergency Department (HOSPITAL_COMMUNITY): Payer: Medicare Other

## 2017-10-04 ENCOUNTER — Other Ambulatory Visit: Payer: Self-pay

## 2017-10-04 DIAGNOSIS — S6991XA Unspecified injury of right wrist, hand and finger(s), initial encounter: Secondary | ICD-10-CM | POA: Diagnosis not present

## 2017-10-04 DIAGNOSIS — I10 Essential (primary) hypertension: Secondary | ICD-10-CM | POA: Insufficient documentation

## 2017-10-04 DIAGNOSIS — Z95810 Presence of automatic (implantable) cardiac defibrillator: Secondary | ICD-10-CM | POA: Insufficient documentation

## 2017-10-04 DIAGNOSIS — Z951 Presence of aortocoronary bypass graft: Secondary | ICD-10-CM | POA: Insufficient documentation

## 2017-10-04 DIAGNOSIS — Z7901 Long term (current) use of anticoagulants: Secondary | ICD-10-CM | POA: Diagnosis not present

## 2017-10-04 DIAGNOSIS — E119 Type 2 diabetes mellitus without complications: Secondary | ICD-10-CM | POA: Insufficient documentation

## 2017-10-04 DIAGNOSIS — M25421 Effusion, right elbow: Secondary | ICD-10-CM | POA: Diagnosis not present

## 2017-10-04 DIAGNOSIS — Y939 Activity, unspecified: Secondary | ICD-10-CM | POA: Diagnosis not present

## 2017-10-04 DIAGNOSIS — S5001XA Contusion of right elbow, initial encounter: Secondary | ICD-10-CM | POA: Insufficient documentation

## 2017-10-04 DIAGNOSIS — Z7982 Long term (current) use of aspirin: Secondary | ICD-10-CM | POA: Insufficient documentation

## 2017-10-04 DIAGNOSIS — W06XXXA Fall from bed, initial encounter: Secondary | ICD-10-CM | POA: Diagnosis not present

## 2017-10-04 DIAGNOSIS — I2510 Atherosclerotic heart disease of native coronary artery without angina pectoris: Secondary | ICD-10-CM | POA: Insufficient documentation

## 2017-10-04 DIAGNOSIS — Y929 Unspecified place or not applicable: Secondary | ICD-10-CM | POA: Insufficient documentation

## 2017-10-04 DIAGNOSIS — Z79899 Other long term (current) drug therapy: Secondary | ICD-10-CM | POA: Diagnosis not present

## 2017-10-04 DIAGNOSIS — Z87891 Personal history of nicotine dependence: Secondary | ICD-10-CM | POA: Diagnosis not present

## 2017-10-04 DIAGNOSIS — W19XXXA Unspecified fall, initial encounter: Secondary | ICD-10-CM

## 2017-10-04 DIAGNOSIS — M79641 Pain in right hand: Secondary | ICD-10-CM | POA: Diagnosis not present

## 2017-10-04 DIAGNOSIS — Y999 Unspecified external cause status: Secondary | ICD-10-CM | POA: Diagnosis not present

## 2017-10-04 NOTE — ED Provider Notes (Signed)
[facility] EMERGENCY DEPARTMENT Provider Note   CSN: [id number] Arrival date & time: 10/04/17  1602     History   Chief Complaint Chief Complaint  Patient presents with  . Fall    HPI Alan Orr is a 72 y.o. male.  Accidental slip and fall while sitting on the side of the bed striking his right upper extremity and bridge of his nose.  No neuro deficits, neck trauma, other extremity pain.  Sister reports normal behavior.  Severity of symptoms is mild to moderate.  Palpation makes symptoms worse     Past Medical History:  Diagnosis Date  . CAD (coronary artery disease)   . DM (diabetes mellitus) ([place])    type 2   . DVT (deep venous thrombosis) ([place])   . Hyperlipidemia   . Hypertension   . ICD (implantable cardioverter-defibrillator), single, in situ   . Ischemic cardiomyopathy   . PAF (paroxysmal atrial fibrillation) ([place])   . RBBB   . S/P CABG x 5     Patient Active Problem List   Diagnosis Date Noted  . Salmonella sepsis ([place]) 10/21/2016  . Salmonella gastroenteritis 10/20/2016  . Transaminasemia 10/18/2016  . Pancytopenia ([place]) 10/18/2016  . DM type 2 (diabetes mellitus, type 2) ([place]) 10/18/2016  . Thrombocytopenia ([place]) 08/22/2016  . Tick bite of right thigh 08/22/2016  . Abnormal serum protein electrophoresis 08/21/2014  . Leg wound, left 11/03/2013  . History of carotid artery disease 03/03/2013  . Cardiomyopathy, ischemic 11/08/2012  . CAD s/p CABG  11/08/2012  . Hyperlipidemia 11/08/2012  . Paroxysmal atrial fibrillation ([place]) 11/08/2012  . Ventricular tachycardia ([place]) 11/08/2012  . Type 2 diabetes mellitus with stage 3 chronic kidney disease ([place]) 08/20/2009  . PACEMAKER, PERMANENT 08/20/2009  . Biventricular ICD (implantable cardioverter-defibrillator) in place 08/20/2009    Past Surgical History:  Procedure Laterality Date  . BACK SURGERY    . CARDIAC CATHETERIZATION  05/08/2009   small vessel disease  . CARDIAC DEFIBRILLATOR PLACEMENT   05/09/2009   [employer]  . CORONARY ARTERY BYPASS GRAFT  12/31/1995   LIMA to LAD,SVG to intermediate,SVG to CX,seq. svg to posterior descending and posterolateral RCA  . EP IMPLANTABLE DEVICE N/A 09/25/2015   Procedure: PPM/BIV PPM Generator Changeout;  Surgeon: Evans Lance, MD;  Location: [place] [facility];  Service: Cardiovascular;  Laterality: N/A;  . I&D EXTREMITY Left 11/03/2013   Procedure: Left Leg Debride Ulcer, Apply Wound VAC and Theraskin;  Surgeon: Newt Minion, MD;  Location: [place];  Service: Orthopedics;  Laterality: Left;  . TEE WITHOUT CARDIOVERSION N/A 01/11/2015   Procedure: TRANSESOPHAGEAL ECHOCARDIOGRAM (TEE);  Surgeon: Herminio Commons, MD;  [facility];  Service: Cardiology;  Laterality: N/A;        Home Medications    Prior to Admission medications   Medication Sig Start Date End Date Taking? Authorizing Provider  ALPRAZolam Duanne Moron) 1 MG tablet Take 1 tablet (1 mg total) by mouth daily as needed for anxiety or sleep. Patient taking differently: Take 1 mg by mouth 4 (four) times daily.  10/22/16  Yes Kathie Dike, MD  amiodarone (PACERONE) 200 MG tablet Take 200 mg by mouth daily.   Yes [provider]  atorvastatin (LIPITOR) 40 MG tablet Take 40 mg by mouth at bedtime.   Yes [provider]  ezetimibe (ZETIA) 10 MG tablet TAKE 1 TABLET ONCE DAILY FOR CHOLESTEROL. 01/04/17  Yes Lorretta Harp, MD  metoprolol tartrate (LOPRESSOR) 50 MG tablet TAKE 1  TABLET BY MOUTH TWICE DAILY. 09/29/17  Yes Evans Lance, MD  niacin (NIASPAN) 1000 MG CR tablet TAKE (1) TABLET BY MOUTH AT BEDTIME. 10/15/16  Yes Bayard Hugger, MD  nitroGLYCERIN (NITROSTAT) 0.4 MG SL tablet Place 0.4 mg under the tongue every 5 (five) minutes x 3 doses as needed for chest pain. 10/31/15  Yes [provider]  oxyCODONE-acetaminophen (PERCOCET) 10-325 MG tablet Take 1 tablet by mouth every 4 (four) hours as needed for pain.   Yes [provider]    PRADAXA 75 MG CAPS capsule TAKE 1 CAPSULE BY MOUTH EVERY 12 HOURS. 09/24/16  Yes Croitoru, Mihai, MD  SYNTHROID 137 MCG tablet Take 137 mcg by mouth daily before breakfast.  07/20/16  Yes [provider]  aspirin EC 81 MG tablet Take 81 mg by mouth at bedtime.    [provider]  ferrous sulfate 325 (65 FE) MG tablet Take 325 mg by mouth daily with breakfast.    [provider]  magnesium oxide (MAG-OX) 400 MG tablet Take 400 mg by mouth daily.    [provider]  Multiple Vitamin (MULTI-VITAMINS) TABS Take 1 tablet by mouth daily.    [provider]    Family History Family History  Problem Relation Age of Onset  . Heart attack Mother   . Stroke Father     Social History Social History   Tobacco Use  . Smoking status: Former Smoker    Packs/day: 1.50    Years: 35.00    Pack years: 52.50    Types: Cigarettes    Last attempt to quit: 11/03/1995    Years since quitting: 21.9  . Smokeless tobacco: Never Used  Substance Use Topics  . Alcohol use: Yes    Comment: occasionally  . Drug use: No     Allergies   Patient has no known allergies.   Review of Systems Review of Systems  All other systems reviewed and are negative.    Physical Exam Updated Vital Signs BP (!) 90/59 (BP Location: Left Arm)   Pulse 87   Temp 98 F (36.7 C) (Temporal)   Resp 18   Wt 88.5 kg (195 lb)   SpO2 94%   BMI 25.04 kg/m   Physical Exam  Constitutional: He is oriented to person, place, and time. He appears well-developed and well-nourished.  HENT:  Head: Normocephalic.  Minimal ecchymosis on the bridge of the nose where his eyeglasses were located.  No bony tenderness.  Eyes: Conjunctivae are normal.  Neck: Neck supple.  Cardiovascular: Normal rate and regular rhythm.  Pulmonary/Chest: Effort normal and breath sounds normal.  Abdominal: Soft. Bowel sounds are normal.  Musculoskeletal:  Tender right elbow and right hand.  Ecchymosis on  posterior lateral forearm  Neurological: He is alert and oriented to person, place, and time.  Skin: Skin is warm and dry.  Psychiatric: He has a normal mood and affect. His behavior is normal.  Nursing note and vitals reviewed.    ED Treatments / Results  Labs (all labs ordered are listed, but only abnormal results are displayed) Labs Reviewed - No data to display  EKG None  Radiology Dg Elbow Complete Right  Result Date: 10/04/2017 CLINICAL DATA:  Acute LEFT elbow pain following EXAM: RIGHT ELBOW - COMPLETE 3+ VIEW COMPARISON:  None. FINDINGS: Moderate degenerative changes at the humeral ulnar joint noted with chronic deformity of the radial head/neck. No definite acute fracture or dislocation noted. An elbow effusion is noted. Vascular  calcifications identified. No radiopaque bodies noted. IMPRESSION: Elbow effusion without acute bony abnormality. Moderate degenerative/posttraumatic changes. Electronically Signed   By: Margarette Canada M.D.   On: 10/04/2017 16:47   Dg Hand Complete Right  Result Date: 10/04/2017 CLINICAL DATA:  Fall with pain to the third fourth and fifth knuckles EXAM: RIGHT HAND - COMPLETE 3+ VIEW COMPARISON:  Forearm radiograph 10/04/2004 FINDINGS: Old deformity of the first metacarpal. No acute displaced fracture or malalignment. Moderate to marked arthritis at the distal radiocarpal interval. Scattered cysts within the scaphoid and triquetrum bones. Vascular calcification. IMPRESSION: 1. No acute osseous abnormality. 2. Old deformity of the first metacarpal. Electronically Signed   By: Donavan Foil M.D.   On: 10/04/2017 18:29    Procedures Procedures (including critical care time)  Medications Ordered in ED Medications - No data to display   Initial Impression / Assessment and Plan / ED Course  I have reviewed the triage vital signs and the nursing notes.  Pertinent labs & imaging results that were available during my care of the patient were reviewed by me  and considered in my medical decision making (see chart for details).     Accidental fall off the bed striking his right upper extremity.  Plain films of right elbow and right hand show no fracture, but there is evidence of an elbow effusion.  Patient was observed for 2 to 3 hours with no evidence of neurological deficits or other bony pain.  Sister reports normal behavior  Final Clinical Impressions(s) / ED Diagnoses   Final diagnoses:  Fall, initial encounter  Contusion of right elbow, initial encounter    ED Discharge Orders    None       Nat Christen, MD 10/04/17 2102

## 2017-10-04 NOTE — ED Triage Notes (Addendum)
Pt fell out of the bed while attempting to sit on the side of the bed. Pt c/o pain to RT elbow. Pt noted to have skin tears. Denies hitting head or LOC.

## 2017-10-04 NOTE — Discharge Instructions (Addendum)
X-ray showed no broken bones.  Follow-up with orthopedic doctor if not improving.  Phone number given.

## 2017-10-26 ENCOUNTER — Other Ambulatory Visit: Payer: Self-pay | Admitting: Internal Medicine

## 2017-10-28 ENCOUNTER — Other Ambulatory Visit: Payer: Self-pay | Admitting: Cardiovascular Disease

## 2017-10-28 NOTE — Telephone Encounter (Signed)
LOW DOSE DUE TO DDI WITH AMIODARONE

## 2017-11-11 ENCOUNTER — Encounter: Payer: Medicare Other | Admitting: *Deleted

## 2017-11-11 ENCOUNTER — Telehealth: Payer: Self-pay

## 2017-11-11 NOTE — Telephone Encounter (Signed)
Spoke with pt and reminded pt of remote transmission that is due today. Pt verbalized understanding.   

## 2017-11-12 ENCOUNTER — Encounter: Payer: Self-pay | Admitting: Cardiology

## 2017-11-24 ENCOUNTER — Ambulatory Visit (INDEPENDENT_AMBULATORY_CARE_PROVIDER_SITE_OTHER): Payer: Medicare Other | Admitting: *Deleted

## 2017-11-24 DIAGNOSIS — I472 Ventricular tachycardia, unspecified: Secondary | ICD-10-CM

## 2017-11-24 DIAGNOSIS — I5022 Chronic systolic (congestive) heart failure: Secondary | ICD-10-CM

## 2017-11-24 NOTE — Progress Notes (Signed)
Remote pacemaker transmission.   

## 2017-12-06 DIAGNOSIS — E063 Autoimmune thyroiditis: Secondary | ICD-10-CM | POA: Diagnosis not present

## 2017-12-06 DIAGNOSIS — N183 Chronic kidney disease, stage 3 (moderate): Secondary | ICD-10-CM | POA: Diagnosis not present

## 2017-12-06 DIAGNOSIS — Z1389 Encounter for screening for other disorder: Secondary | ICD-10-CM | POA: Diagnosis not present

## 2017-12-06 DIAGNOSIS — B353 Tinea pedis: Secondary | ICD-10-CM | POA: Diagnosis not present

## 2017-12-06 DIAGNOSIS — E1140 Type 2 diabetes mellitus with diabetic neuropathy, unspecified: Secondary | ICD-10-CM | POA: Diagnosis not present

## 2017-12-06 DIAGNOSIS — E119 Type 2 diabetes mellitus without complications: Secondary | ICD-10-CM | POA: Diagnosis not present

## 2017-12-06 DIAGNOSIS — G894 Chronic pain syndrome: Secondary | ICD-10-CM | POA: Diagnosis not present

## 2017-12-06 DIAGNOSIS — E782 Mixed hyperlipidemia: Secondary | ICD-10-CM | POA: Diagnosis not present

## 2017-12-15 LAB — CUP PACEART REMOTE DEVICE CHECK
Battery Remaining Longevity: 86 mo
Brady Statistic AP VP Percent: 43.25 %
Brady Statistic AS VP Percent: 56.66 %
Brady Statistic AS VS Percent: 0.04 %
Brady Statistic RA Percent Paced: 43.23 %
Date Time Interrogation Session: 20190911223209
Implantable Lead Implant Date: 20110224
Implantable Lead Location: 753859
Implantable Lead Location: 753860
Implantable Lead Model: 4196
Implantable Pulse Generator Implant Date: 20170712
Lead Channel Impedance Value: 532 Ohm
Lead Channel Impedance Value: 532 Ohm
Lead Channel Impedance Value: 589 Ohm
Lead Channel Impedance Value: 608 Ohm
Lead Channel Impedance Value: 988 Ohm
Lead Channel Pacing Threshold Amplitude: 0.875 V
Lead Channel Pacing Threshold Pulse Width: 0.4 ms
Lead Channel Pacing Threshold Pulse Width: 0.4 ms
Lead Channel Sensing Intrinsic Amplitude: 1.25 mV
Lead Channel Sensing Intrinsic Amplitude: 1.25 mV
Lead Channel Sensing Intrinsic Amplitude: 11 mV
Lead Channel Setting Pacing Amplitude: 2 V
Lead Channel Setting Pacing Amplitude: 2.5 V
Lead Channel Setting Pacing Pulse Width: 0.4 ms
Lead Channel Setting Sensing Sensitivity: 0.9 mV
MDC IDC LEAD IMPLANT DT: 20110224
MDC IDC LEAD IMPLANT DT: 20110224
MDC IDC LEAD LOCATION: 753858
MDC IDC MSMT BATTERY VOLTAGE: 3.02 V
MDC IDC MSMT LEADCHNL LV IMPEDANCE VALUE: 665 Ohm
MDC IDC MSMT LEADCHNL LV PACING THRESHOLD AMPLITUDE: 0.75 V
MDC IDC MSMT LEADCHNL RA IMPEDANCE VALUE: 361 Ohm
MDC IDC MSMT LEADCHNL RA IMPEDANCE VALUE: 475 Ohm
MDC IDC MSMT LEADCHNL RV IMPEDANCE VALUE: 513 Ohm
MDC IDC MSMT LEADCHNL RV PACING THRESHOLD AMPLITUDE: 0.625 V
MDC IDC MSMT LEADCHNL RV PACING THRESHOLD PULSEWIDTH: 0.4 ms
MDC IDC MSMT LEADCHNL RV SENSING INTR AMPL: 11 mV
MDC IDC SET LEADCHNL LV PACING AMPLITUDE: 1.25 V
MDC IDC SET LEADCHNL LV PACING PULSEWIDTH: 0.4 ms
MDC IDC STAT BRADY AP VS PERCENT: 0.05 %
MDC IDC STAT BRADY RV PERCENT PACED: 99.8 %

## 2017-12-25 ENCOUNTER — Emergency Department (HOSPITAL_COMMUNITY): Payer: Medicare Other

## 2017-12-25 ENCOUNTER — Other Ambulatory Visit: Payer: Self-pay

## 2017-12-25 ENCOUNTER — Encounter (HOSPITAL_COMMUNITY): Payer: Self-pay | Admitting: Emergency Medicine

## 2017-12-25 ENCOUNTER — Emergency Department (HOSPITAL_COMMUNITY)
Admission: EM | Admit: 2017-12-25 | Discharge: 2017-12-25 | Disposition: A | Discharge status: Home / Self Care | Payer: Medicare Other | Attending: Emergency Medicine | Admitting: Emergency Medicine

## 2017-12-25 DIAGNOSIS — Z79899 Other long term (current) drug therapy: Secondary | ICD-10-CM | POA: Diagnosis not present

## 2017-12-25 DIAGNOSIS — Z951 Presence of aortocoronary bypass graft: Secondary | ICD-10-CM | POA: Insufficient documentation

## 2017-12-25 DIAGNOSIS — E1122 Type 2 diabetes mellitus with diabetic chronic kidney disease: Secondary | ICD-10-CM | POA: Insufficient documentation

## 2017-12-25 DIAGNOSIS — W0110XA Fall on same level from slipping, tripping and stumbling with subsequent striking against unspecified object, initial encounter: Secondary | ICD-10-CM | POA: Diagnosis not present

## 2017-12-25 DIAGNOSIS — Z87891 Personal history of nicotine dependence: Secondary | ICD-10-CM | POA: Insufficient documentation

## 2017-12-25 DIAGNOSIS — Z7902 Long term (current) use of antithrombotics/antiplatelets: Secondary | ICD-10-CM | POA: Insufficient documentation

## 2017-12-25 DIAGNOSIS — N183 Chronic kidney disease, stage 3 (moderate): Secondary | ICD-10-CM | POA: Insufficient documentation

## 2017-12-25 DIAGNOSIS — I2510 Atherosclerotic heart disease of native coronary artery without angina pectoris: Secondary | ICD-10-CM | POA: Insufficient documentation

## 2017-12-25 DIAGNOSIS — Z950 Presence of cardiac pacemaker: Secondary | ICD-10-CM | POA: Diagnosis not present

## 2017-12-25 DIAGNOSIS — Y9389 Activity, other specified: Secondary | ICD-10-CM | POA: Diagnosis not present

## 2017-12-25 DIAGNOSIS — Y929 Unspecified place or not applicable: Secondary | ICD-10-CM | POA: Insufficient documentation

## 2017-12-25 DIAGNOSIS — I129 Hypertensive chronic kidney disease with stage 1 through stage 4 chronic kidney disease, or unspecified chronic kidney disease: Secondary | ICD-10-CM | POA: Insufficient documentation

## 2017-12-25 DIAGNOSIS — Y998 Other external cause status: Secondary | ICD-10-CM | POA: Diagnosis not present

## 2017-12-25 DIAGNOSIS — R1084 Generalized abdominal pain: Secondary | ICD-10-CM | POA: Diagnosis not present

## 2017-12-25 DIAGNOSIS — I10 Essential (primary) hypertension: Secondary | ICD-10-CM | POA: Diagnosis not present

## 2017-12-25 DIAGNOSIS — S20211A Contusion of right front wall of thorax, initial encounter: Secondary | ICD-10-CM

## 2017-12-25 DIAGNOSIS — Z7982 Long term (current) use of aspirin: Secondary | ICD-10-CM | POA: Diagnosis not present

## 2017-12-25 DIAGNOSIS — S299XXA Unspecified injury of thorax, initial encounter: Secondary | ICD-10-CM | POA: Diagnosis not present

## 2017-12-25 DIAGNOSIS — R0781 Pleurodynia: Secondary | ICD-10-CM | POA: Diagnosis not present

## 2017-12-25 LAB — URINALYSIS, ROUTINE W REFLEX MICROSCOPIC
BACTERIA UA: NONE SEEN
BILIRUBIN URINE: NEGATIVE
Glucose, UA: NEGATIVE mg/dL
HGB URINE DIPSTICK: NEGATIVE
Ketones, ur: 5 mg/dL — AB
LEUKOCYTES UA: NEGATIVE
NITRITE: NEGATIVE
PROTEIN: 30 mg/dL — AB
Specific Gravity, Urine: 1.023 (ref 1.005–1.030)
pH: 6 (ref 5.0–8.0)

## 2017-12-25 LAB — COMPREHENSIVE METABOLIC PANEL
ALT: 53 U/L — ABNORMAL HIGH (ref 0–44)
ANION GAP: 12 (ref 5–15)
AST: 65 U/L — ABNORMAL HIGH (ref 15–41)
Albumin: 3.5 g/dL (ref 3.5–5.0)
Alkaline Phosphatase: 88 U/L (ref 38–126)
BUN: 12 mg/dL (ref 8–23)
CALCIUM: 8.9 mg/dL (ref 8.9–10.3)
CHLORIDE: 101 mmol/L (ref 98–111)
CO2: 28 mmol/L (ref 22–32)
Creatinine, Ser: 1.16 mg/dL (ref 0.61–1.24)
GFR calc non Af Amer: >60 mL/min (ref >60)
Glucose, Bld: 143 mg/dL — ABNORMAL HIGH (ref 70–99)
POTASSIUM: 4.2 mmol/L (ref 3.5–5.1)
Sodium: 141 mmol/L (ref 135–145)
Total Bilirubin: 1.3 mg/dL — ABNORMAL HIGH (ref 0.3–1.2)
Total Protein: 7.5 g/dL (ref 6.5–8.1)

## 2017-12-25 LAB — CBC WITH DIFFERENTIAL/PLATELET
Abs Immature Granulocytes: 0.03 10*3/uL (ref 0.00–0.07)
BASOS PCT: 0 %
Basophils Absolute: 0 10*3/uL (ref 0.0–0.1)
EOS PCT: 0 %
Eosinophils Absolute: 0 10*3/uL (ref 0.0–0.5)
HCT: 45.1 % (ref 39.0–52.0)
Hemoglobin: 15 g/dL (ref 13.0–17.0)
Immature Granulocytes: 0 %
Lymphocytes Relative: 13 %
Lymphs Abs: 1.1 10*3/uL (ref 0.7–4.0)
MCH: 35.6 pg — AB (ref 26.0–34.0)
MCHC: 33.3 g/dL (ref 30.0–36.0)
MCV: 107.1 fL — ABNORMAL HIGH (ref 80.0–100.0)
MONO ABS: 0.9 10*3/uL (ref 0.1–1.0)
Monocytes Relative: 10 %
NEUTROS ABS: 6.9 10*3/uL (ref 1.7–7.7)
NRBC: 0 % (ref 0.0–0.2)
Neutrophils Relative %: 77 %
PLATELETS: 206 10*3/uL (ref 150–400)
RBC: 4.21 MIL/uL — AB (ref 4.22–5.81)
RDW: 14.2 % (ref 11.5–15.5)
WBC: 9 10*3/uL (ref 4.0–10.5)

## 2017-12-25 LAB — TROPONIN I: Troponin I: <0.03 ng/mL (ref <0.03)

## 2017-12-25 MED ORDER — OXYCODONE-ACETAMINOPHEN 5-325 MG PO TABS
1.0000 | ORAL_TABLET | Freq: Once | ORAL | Status: AC
  Administered 2017-12-25: 1 via ORAL
  Filled 2017-12-25: qty 1

## 2017-12-25 MED ORDER — OXYCODONE-ACETAMINOPHEN 5-325 MG PO TABS: 1.0000 | ORAL_TABLET | ORAL | 0 refills | Status: DC | PRN

## 2017-12-25 NOTE — ED Notes (Signed)
Pt requesting pain medication prior to d/c. Pt's family is taking patient home. Verbal order for 1 Percocet given by Dr. Lacinda Axon.

## 2017-12-25 NOTE — ED Provider Notes (Signed)
[facility] EMERGENCY DEPARTMENT Provider Note   CSN: [id number] Arrival date & time: 12/25/17  2878     History   Chief Complaint Chief Complaint  Patient presents with  . Rib Injury    HPI Alan Orr is a 72 y.o. male.  Accidental trip and fall striking the right side of his chest wall yesterday.  No head or neck trauma.  Pain is worse with deep inspiration.  Severity is mild to moderate.  Patient is alert and oriented.     Past Medical History:  Diagnosis Date  . CAD (coronary artery disease)   . DM (diabetes mellitus) ([place])    type 2   . DVT (deep venous thrombosis) ([place])   . Hyperlipidemia   . Hypertension   . ICD (implantable cardioverter-defibrillator), single, in situ   . Ischemic cardiomyopathy   . PAF (paroxysmal atrial fibrillation) ([place])   . RBBB   . S/P CABG x 5     Patient Active Problem List   Diagnosis Date Noted  . Salmonella sepsis ([place]) 10/21/2016  . Salmonella gastroenteritis 10/20/2016  . Transaminasemia 10/18/2016  . Pancytopenia ([place]) 10/18/2016  . DM type 2 (diabetes mellitus, type 2) ([place]) 10/18/2016  . Thrombocytopenia ([place]) 08/22/2016  . Tick bite of right thigh 08/22/2016  . Abnormal serum protein electrophoresis 08/21/2014  . Leg wound, left 11/03/2013  . History of carotid artery disease 03/03/2013  . Cardiomyopathy, ischemic 11/08/2012  . CAD s/p CABG  11/08/2012  . Hyperlipidemia 11/08/2012  . Paroxysmal atrial fibrillation ([place]) 11/08/2012  . Ventricular tachycardia ([place]) 11/08/2012  . Type 2 diabetes mellitus with stage 3 chronic kidney disease ([place]) 08/20/2009  . PACEMAKER, PERMANENT 08/20/2009  . Biventricular ICD (implantable cardioverter-defibrillator) in place 08/20/2009    Past Surgical History:  Procedure Laterality Date  . BACK SURGERY    . CARDIAC CATHETERIZATION  05/08/2009   small vessel disease  . CARDIAC DEFIBRILLATOR PLACEMENT  05/09/2009   [employer]  . CORONARY ARTERY BYPASS GRAFT  12/31/1995   LIMA to LAD,SVG to intermediate,SVG to CX,seq. svg to posterior descending and posterolateral RCA  . EP IMPLANTABLE DEVICE N/A 09/25/2015   Procedure: PPM/BIV PPM Generator Changeout;  Surgeon: Evans Lance, MD;  Location: [place] [facility];  Service: Cardiovascular;  Laterality: N/A;  . I&D EXTREMITY Left 11/03/2013   Procedure: Left Leg Debride Ulcer, Apply Wound VAC and Theraskin;  Surgeon: Newt Minion, MD;  Location: [place];  Service: Orthopedics;  Laterality: Left;  . TEE WITHOUT CARDIOVERSION N/A 01/11/2015   Procedure: TRANSESOPHAGEAL ECHOCARDIOGRAM (TEE);  Surgeon: Herminio Commons, MD;  [facility];  Service: Cardiology;  Laterality: N/A;        Home Medications    Prior to Admission medications   Medication Sig Start Date End Date Taking? Authorizing Provider  ALPRAZolam Duanne Moron) 1 MG tablet Take 1 tablet (1 mg total) by mouth daily as needed for anxiety or sleep. Patient taking differently: Take 1 mg by mouth 4 (four) times daily.  10/22/16   Kathie Dike, MD  amiodarone (PACERONE) 200 MG tablet Take 200 mg by mouth daily.    [provider]  aspirin EC 81 MG tablet Take 81 mg by mouth at bedtime.    [provider]  atorvastatin (LIPITOR) 40 MG tablet Take 40 mg by mouth at bedtime.    [provider]  dabigatran (PRADAXA) 75 MG CAPS capsule Take 1 capsule (75 mg total) by mouth 2 (two) times daily. SCHEDULE FOLLOW  UP APPOINTMENT PRIOR TO NEXT REFILL REQUEST. 10/28/17   Croitoru, Mihai, MD  ezetimibe (ZETIA) 10 MG tablet TAKE 1 TABLET ONCE DAILY FOR CHOLESTEROL. 01/04/17   Lorretta Harp, MD  ferrous sulfate 325 (65 FE) MG tablet Take 325 mg by mouth daily with breakfast.    [provider]  magnesium oxide (MAG-OX) 400 MG tablet Take 400 mg by mouth daily.    [provider]  metoprolol tartrate (LOPRESSOR) 50 MG tablet TAKE 1 TABLET BY MOUTH TWICE DAILY. 10/26/17   Evans Lance, MD  Multiple Vitamin  (MULTI-VITAMINS) TABS Take 1 tablet by mouth daily.    [provider]  niacin (NIASPAN) 1000 MG CR tablet TAKE (1) TABLET BY MOUTH AT BEDTIME. 10/15/16   Bayard Hugger, MD  nitroGLYCERIN (NITROSTAT) 0.4 MG SL tablet Place 0.4 mg under the tongue every 5 (five) minutes x 3 doses as needed for chest pain. 10/31/15   [provider]  oxyCODONE-acetaminophen (PERCOCET/ROXICET) 5-325 MG tablet Take 1 tablet by mouth every 4 (four) hours as needed for severe pain. 12/25/17   Nat Christen, MD  SYNTHROID 137 MCG tablet Take 137 mcg by mouth daily before breakfast.  07/20/16   [provider]    Family History Family History  Problem Relation Age of Onset  . Heart attack Mother   . Stroke Father     Social History Social History   Tobacco Use  . Smoking status: Former Smoker    Packs/day: 1.50    Years: 35.00    Pack years: 52.50    Types: Cigarettes    Last attempt to quit: 11/03/1995    Years since quitting: 22.1  . Smokeless tobacco: Never Used  Substance Use Topics  . Alcohol use: Yes    Comment: occasionally  . Drug use: No     Allergies   Patient has no known allergies.   Review of Systems Review of Systems  All other systems reviewed and are negative.    Physical Exam Updated Vital Signs BP (!) 105/53   Pulse 69   Temp 98.7 F (37.1 C) (Oral)   Resp 20   Ht 6\' 2"  (1.88 m)   Wt 93.4 kg   SpO2 91%   BMI 26.45 kg/m   Physical Exam  Constitutional: He is oriented to person, place, and time. He appears well-developed and well-nourished.  HENT:  Head: Normocephalic and atraumatic.  Eyes: Conjunctivae are normal.  Neck: Neck supple.  Cardiovascular: Normal rate and regular rhythm.  Pulmonary/Chest: Effort normal and breath sounds normal.  Tender right mid anterior lateral chest wall  Abdominal: Soft. Bowel sounds are normal.  Musculoskeletal: Normal range of motion.  Neurological: He is alert and oriented to person, place, and time.    Skin: Skin is warm and dry.  Psychiatric: He has a normal mood and affect. His behavior is normal.  Nursing note and vitals reviewed.    ED Treatments / Results  Labs (all labs ordered are listed, but only abnormal results are displayed) Labs Reviewed  CBC WITH DIFFERENTIAL/PLATELET - Abnormal; Notable for the following components:      Result Value   RBC 4.21 (*)    MCV 107.1 (*)    MCH 35.6 (*)    All other components within normal limits  COMPREHENSIVE METABOLIC PANEL - Abnormal; Notable for the following components:   Glucose, Bld 143 (*)    AST 65 (*)    ALT 53 (*)    Total Bilirubin  1.3 (*)    All other components within normal limits  TROPONIN I  URINALYSIS, ROUTINE W REFLEX MICROSCOPIC    EKG EKG Interpretation  Date/Time:  Saturday December 25 2017 06:19:41 EDT Ventricular Rate:  84 PR Interval:    QRS Duration: 138 QT Interval:  422 QTC Calculation: 499 R Axis:   -138 Text Interpretation:  Ventricular-paced rhythm No further analysis attempted due to paced rhythm No significant change was found Confirmed by Ezequiel Essex [phone]) on 12/25/2017 7:08:42 AM   Radiology Dg Ribs Unilateral W/chest Right  Result Date: 12/25/2017 CLINICAL DATA:  Fall yesterday.  Right rib pain EXAM: RIGHT RIBS AND CHEST - 3+ VIEW COMPARISON:  10/17/2016 FINDINGS: CABG. AICD unchanged in position. Negative for heart failure. Mild bibasilar atelectasis. No effusion or pneumothorax. Negative for right rib fracture. IMPRESSION: No acute rib fracture on the right.  Mild bibasilar atelectasis. Electronically Signed   By: Franchot Gallo M.D.   On: 12/25/2017 08:37    Procedures Procedures (including critical care time)  Medications Ordered in ED Medications - No data to display   Initial Impression / Assessment and Plan / ED Course  I have reviewed the triage vital signs and the nursing notes.  Pertinent labs & imaging results that were available during my care of the patient  were reviewed by me and considered in my medical decision making (see chart for details).     Presents with right rib pain.  Plain films revealed no fracture or pneumothorax.  Patient is hemodynamically stable.  Discharge medication Percocet (12)  Final Clinical Impressions(s) / ED Diagnoses   Final diagnoses:  Contusion of right chest wall, initial encounter    ED Discharge Orders         Ordered    oxyCODONE-acetaminophen (PERCOCET/ROXICET) 5-325 MG tablet  Every 4 hours PRN     12/25/17 1008           Nat Christen, MD 12/25/17 1016

## 2017-12-25 NOTE — ED Notes (Signed)
Patient transported to X-ray 

## 2017-12-25 NOTE — ED Notes (Signed)
Phlebotomy at bedside.

## 2017-12-25 NOTE — ED Notes (Signed)
Pt returned from xray

## 2017-12-25 NOTE — ED Triage Notes (Signed)
Pt arrived by [facility] with multiple complaints. Pt c/o right leg pain, right rib pain, and abd pain denies any falls

## 2017-12-25 NOTE — Discharge Instructions (Addendum)
X-ray of right ribs negative.  Prescription for pain medicine.  Follow-up with your primary care doctor.

## 2017-12-25 NOTE — ED Notes (Signed)
EDP at bedside  

## 2018-02-02 ENCOUNTER — Other Ambulatory Visit: Payer: Self-pay | Admitting: Internal Medicine

## 2018-02-09 DIAGNOSIS — M2550 Pain in unspecified joint: Secondary | ICD-10-CM | POA: Diagnosis not present

## 2018-02-09 DIAGNOSIS — G894 Chronic pain syndrome: Secondary | ICD-10-CM | POA: Diagnosis not present

## 2018-02-09 DIAGNOSIS — E119 Type 2 diabetes mellitus without complications: Secondary | ICD-10-CM | POA: Diagnosis not present

## 2018-02-23 ENCOUNTER — Telehealth: Payer: Self-pay | Admitting: Cardiology

## 2018-02-23 ENCOUNTER — Ambulatory Visit: Payer: Medicare Other

## 2018-02-23 NOTE — Telephone Encounter (Signed)
Attempted to confirm remote transmission with pt. No answer and was unable to leave a message.   

## 2018-02-24 NOTE — Progress Notes (Signed)
Pt did not send a remote transmission

## 2018-02-25 ENCOUNTER — Encounter: Payer: Self-pay | Admitting: Cardiology

## 2018-03-28 DIAGNOSIS — I4891 Unspecified atrial fibrillation: Secondary | ICD-10-CM | POA: Diagnosis not present

## 2018-03-28 DIAGNOSIS — G894 Chronic pain syndrome: Secondary | ICD-10-CM | POA: Diagnosis not present

## 2018-03-28 DIAGNOSIS — M1991 Primary osteoarthritis, unspecified site: Secondary | ICD-10-CM | POA: Diagnosis not present

## 2018-03-28 DIAGNOSIS — E1122 Type 2 diabetes mellitus with diabetic chronic kidney disease: Secondary | ICD-10-CM | POA: Diagnosis not present

## 2018-04-04 ENCOUNTER — Other Ambulatory Visit: Payer: Self-pay | Admitting: Internal Medicine

## 2018-05-04 DIAGNOSIS — G894 Chronic pain syndrome: Secondary | ICD-10-CM | POA: Diagnosis not present

## 2018-05-04 DIAGNOSIS — Z1389 Encounter for screening for other disorder: Secondary | ICD-10-CM | POA: Diagnosis not present

## 2018-05-04 DIAGNOSIS — I480 Paroxysmal atrial fibrillation: Secondary | ICD-10-CM | POA: Diagnosis not present

## 2018-05-04 DIAGNOSIS — Z0000 Encounter for general adult medical examination without abnormal findings: Secondary | ICD-10-CM | POA: Diagnosis not present

## 2018-05-30 ENCOUNTER — Ambulatory Visit (INDEPENDENT_AMBULATORY_CARE_PROVIDER_SITE_OTHER): Payer: Medicare Other | Admitting: *Deleted

## 2018-05-30 DIAGNOSIS — I472 Ventricular tachycardia, unspecified: Secondary | ICD-10-CM

## 2018-05-30 DIAGNOSIS — I5022 Chronic systolic (congestive) heart failure: Secondary | ICD-10-CM

## 2018-06-01 ENCOUNTER — Other Ambulatory Visit: Payer: Self-pay

## 2018-06-02 DIAGNOSIS — I4891 Unspecified atrial fibrillation: Secondary | ICD-10-CM | POA: Diagnosis not present

## 2018-06-02 DIAGNOSIS — G894 Chronic pain syndrome: Secondary | ICD-10-CM | POA: Diagnosis not present

## 2018-06-02 DIAGNOSIS — E1140 Type 2 diabetes mellitus with diabetic neuropathy, unspecified: Secondary | ICD-10-CM | POA: Diagnosis not present

## 2018-06-02 DIAGNOSIS — Z23 Encounter for immunization: Secondary | ICD-10-CM | POA: Diagnosis not present

## 2018-06-02 DIAGNOSIS — M1991 Primary osteoarthritis, unspecified site: Secondary | ICD-10-CM | POA: Diagnosis not present

## 2018-06-02 DIAGNOSIS — I472 Ventricular tachycardia: Secondary | ICD-10-CM | POA: Diagnosis not present

## 2018-06-02 LAB — CUP PACEART REMOTE DEVICE CHECK
Battery Remaining Longevity: 79 mo
Battery Voltage: 3.02 V
Brady Statistic AP VP Percent: 53.54 %
Brady Statistic AS VP Percent: 46.39 %
Brady Statistic AS VS Percent: 0.03 %
Brady Statistic RA Percent Paced: 53.53 %
Brady Statistic RV Percent Paced: 99.78 %
Date Time Interrogation Session: 20200316223354
Implantable Lead Implant Date: 20110224
Implantable Lead Implant Date: 20110224
Implantable Lead Location: 753859
Implantable Lead Model: 4196
Implantable Lead Model: 6947
Lead Channel Impedance Value: 437 Ohm
Lead Channel Impedance Value: 456 Ohm
Lead Channel Impedance Value: 551 Ohm
Lead Channel Impedance Value: 570 Ohm
Lead Channel Impedance Value: 608 Ohm
Lead Channel Pacing Threshold Amplitude: 0.75 V
Lead Channel Pacing Threshold Amplitude: 0.875 V
Lead Channel Pacing Threshold Pulse Width: 0.4 ms
Lead Channel Pacing Threshold Pulse Width: 0.4 ms
Lead Channel Pacing Threshold Pulse Width: 0.4 ms
Lead Channel Sensing Intrinsic Amplitude: 1.5 mV
Lead Channel Sensing Intrinsic Amplitude: 12.375 mV
Lead Channel Setting Pacing Amplitude: 1.25 V
Lead Channel Setting Pacing Amplitude: 2 V
Lead Channel Setting Pacing Amplitude: 2.5 V
Lead Channel Setting Pacing Pulse Width: 0.4 ms
Lead Channel Setting Pacing Pulse Width: 0.4 ms
MDC IDC LEAD IMPLANT DT: 20110224
MDC IDC LEAD LOCATION: 753858
MDC IDC LEAD LOCATION: 753860
MDC IDC MSMT LEADCHNL LV IMPEDANCE VALUE: 513 Ohm
MDC IDC MSMT LEADCHNL LV IMPEDANCE VALUE: 912 Ohm
MDC IDC MSMT LEADCHNL RA IMPEDANCE VALUE: 361 Ohm
MDC IDC MSMT LEADCHNL RA IMPEDANCE VALUE: 475 Ohm
MDC IDC MSMT LEADCHNL RA SENSING INTR AMPL: 1.5 mV
MDC IDC MSMT LEADCHNL RV PACING THRESHOLD AMPLITUDE: 0.625 V
MDC IDC MSMT LEADCHNL RV SENSING INTR AMPL: 12.375 mV
MDC IDC PG IMPLANT DT: 20170712
MDC IDC SET LEADCHNL RV SENSING SENSITIVITY: 0.9 mV
MDC IDC STAT BRADY AP VS PERCENT: 0.05 %

## 2018-06-07 NOTE — Progress Notes (Signed)
Remote pacemaker transmission.   

## 2018-06-10 ENCOUNTER — Other Ambulatory Visit: Payer: Self-pay | Admitting: Internal Medicine

## 2018-06-13 ENCOUNTER — Other Ambulatory Visit: Payer: Self-pay | Admitting: Internal Medicine

## 2018-06-13 MED ORDER — METOPROLOL TARTRATE 50 MG PO TABS: 50.0000 mg | ORAL_TABLET | Freq: Two times a day (BID) | ORAL | 0 refills | Status: DC

## 2018-06-21 ENCOUNTER — Other Ambulatory Visit: Payer: Self-pay

## 2018-06-21 ENCOUNTER — Encounter (HOSPITAL_COMMUNITY): Payer: Self-pay | Admitting: Emergency Medicine

## 2018-06-21 ENCOUNTER — Observation Stay (HOSPITAL_COMMUNITY)
Admission: EM | Admit: 2018-06-21 | Discharge: 2018-06-22 | Disposition: A | Discharge status: Home / Self Care | Payer: Medicare Other | Attending: Emergency Medicine | Admitting: Emergency Medicine

## 2018-06-21 DIAGNOSIS — I480 Paroxysmal atrial fibrillation: Secondary | ICD-10-CM | POA: Diagnosis present

## 2018-06-21 DIAGNOSIS — Z79899 Other long term (current) drug therapy: Secondary | ICD-10-CM | POA: Insufficient documentation

## 2018-06-21 DIAGNOSIS — E785 Hyperlipidemia, unspecified: Secondary | ICD-10-CM | POA: Diagnosis not present

## 2018-06-21 DIAGNOSIS — M79604 Pain in right leg: Secondary | ICD-10-CM | POA: Diagnosis present

## 2018-06-21 DIAGNOSIS — E119 Type 2 diabetes mellitus without complications: Secondary | ICD-10-CM | POA: Diagnosis not present

## 2018-06-21 DIAGNOSIS — Z95810 Presence of automatic (implantable) cardiac defibrillator: Secondary | ICD-10-CM | POA: Diagnosis not present

## 2018-06-21 DIAGNOSIS — L03115 Cellulitis of right lower limb: Principal | ICD-10-CM | POA: Diagnosis present

## 2018-06-21 DIAGNOSIS — Z951 Presence of aortocoronary bypass graft: Secondary | ICD-10-CM | POA: Insufficient documentation

## 2018-06-21 DIAGNOSIS — Z87891 Personal history of nicotine dependence: Secondary | ICD-10-CM | POA: Diagnosis not present

## 2018-06-21 DIAGNOSIS — I10 Essential (primary) hypertension: Secondary | ICD-10-CM | POA: Diagnosis not present

## 2018-06-21 DIAGNOSIS — I2510 Atherosclerotic heart disease of native coronary artery without angina pectoris: Secondary | ICD-10-CM | POA: Diagnosis not present

## 2018-06-21 DIAGNOSIS — Z7982 Long term (current) use of aspirin: Secondary | ICD-10-CM | POA: Insufficient documentation

## 2018-06-21 LAB — CBC WITH DIFFERENTIAL/PLATELET
Abs Immature Granulocytes: 0.03 10*3/uL (ref 0.00–0.07)
Basophils Absolute: 0 10*3/uL (ref 0.0–0.1)
Basophils Relative: 0 %
Eosinophils Absolute: 0.1 10*3/uL (ref 0.0–0.5)
Eosinophils Relative: 1 %
HCT: 40.4 % (ref 39.0–52.0)
Hemoglobin: 13.3 g/dL (ref 13.0–17.0)
Immature Granulocytes: 0 %
Lymphocytes Relative: 25 %
Lymphs Abs: 2.2 10*3/uL (ref 0.7–4.0)
MCH: 34.8 pg — ABNORMAL HIGH (ref 26.0–34.0)
MCHC: 32.9 g/dL (ref 30.0–36.0)
MCV: 105.8 fL — ABNORMAL HIGH (ref 80.0–100.0)
Monocytes Absolute: 0.8 10*3/uL (ref 0.1–1.0)
Monocytes Relative: 9 %
Neutro Abs: 5.7 10*3/uL (ref 1.7–7.7)
Neutrophils Relative %: 65 %
Platelets: 185 10*3/uL (ref 150–400)
RBC: 3.82 MIL/uL — ABNORMAL LOW (ref 4.22–5.81)
RDW: 14.1 % (ref 11.5–15.5)
WBC: 8.8 10*3/uL (ref 4.0–10.5)
nRBC: 0 % (ref 0.0–0.2)

## 2018-06-21 LAB — BASIC METABOLIC PANEL
Anion gap: 8 (ref 5–15)
BUN: 19 mg/dL (ref 8–23)
CO2: 28 mmol/L (ref 22–32)
Calcium: 8.6 mg/dL — ABNORMAL LOW (ref 8.9–10.3)
Chloride: 105 mmol/L (ref 98–111)
Creatinine, Ser: 1.35 mg/dL — ABNORMAL HIGH (ref 0.61–1.24)
GFR calc Af Amer: 60 mL/min — ABNORMAL LOW (ref >60)
GFR calc non Af Amer: 52 mL/min — ABNORMAL LOW (ref >60)
Glucose, Bld: 113 mg/dL — ABNORMAL HIGH (ref 70–99)
Potassium: 3.9 mmol/L (ref 3.5–5.1)
Sodium: 141 mmol/L (ref 135–145)

## 2018-06-21 LAB — GLUCOSE, CAPILLARY: Glucose-Capillary: 146 mg/dL — ABNORMAL HIGH (ref 70–99)

## 2018-06-21 LAB — HEMOGLOBIN A1C
Hgb A1c MFr Bld: 5.9 % — ABNORMAL HIGH (ref 4.8–5.6)
Mean Plasma Glucose: 122.63 mg/dL

## 2018-06-21 MED ORDER — EZETIMIBE 10 MG PO TABS
10.0000 mg | ORAL_TABLET | Freq: Every day | ORAL | Status: DC
  Administered 2018-06-22: 10 mg via ORAL
  Filled 2018-06-21: qty 1

## 2018-06-21 MED ORDER — RAMIPRIL 1.25 MG PO CAPS
2.5000 mg | ORAL_CAPSULE | Freq: Every day | ORAL | Status: DC
  Administered 2018-06-21: 2.5 mg via ORAL
  Filled 2018-06-21: qty 2
  Filled 2018-06-21: qty 1

## 2018-06-21 MED ORDER — ATORVASTATIN CALCIUM 40 MG PO TABS
40.0000 mg | ORAL_TABLET | Freq: Every day | ORAL | Status: DC
  Administered 2018-06-21: 40 mg via ORAL
  Filled 2018-06-21: qty 1

## 2018-06-21 MED ORDER — AMIODARONE HCL 200 MG PO TABS
200.0000 mg | ORAL_TABLET | Freq: Every day | ORAL | Status: DC
  Administered 2018-06-22: 200 mg via ORAL
  Filled 2018-06-21: qty 1

## 2018-06-21 MED ORDER — FERROUS SULFATE 325 (65 FE) MG PO TABS
325.0000 mg | ORAL_TABLET | Freq: Every day | ORAL | Status: DC
  Administered 2018-06-22: 325 mg via ORAL
  Filled 2018-06-21: qty 1

## 2018-06-21 MED ORDER — OXYCODONE-ACETAMINOPHEN 5-325 MG PO TABS: 1.0000 | ORAL_TABLET | ORAL | Status: DC | PRN

## 2018-06-21 MED ORDER — METOPROLOL TARTRATE 50 MG PO TABS
50.0000 mg | ORAL_TABLET | Freq: Two times a day (BID) | ORAL | Status: DC
  Administered 2018-06-21 – 2018-06-22 (×2): 50 mg via ORAL
  Filled 2018-06-21 (×2): qty 1

## 2018-06-21 MED ORDER — ASPIRIN EC 81 MG PO TBEC
81.0000 mg | DELAYED_RELEASE_TABLET | Freq: Every day | ORAL | Status: DC
  Administered 2018-06-21: 81 mg via ORAL
  Filled 2018-06-21: qty 1

## 2018-06-21 MED ORDER — VANCOMYCIN HCL IN DEXTROSE 1-5 GM/200ML-% IV SOLN
1000.0000 mg | INTRAVENOUS | Status: DC
  Administered 2018-06-21 (×2): 1000 mg via INTRAVENOUS
  Filled 2018-06-21 (×2): qty 200

## 2018-06-21 MED ORDER — SODIUM CHLORIDE 0.9 % IV BOLUS
500.0000 mL | Freq: Once | INTRAVENOUS | Status: AC
  Administered 2018-06-21: 500 mL via INTRAVENOUS

## 2018-06-21 MED ORDER — ACETAMINOPHEN 325 MG PO TABS: 650.0000 mg | ORAL_TABLET | Freq: Four times a day (QID) | ORAL | Status: DC | PRN

## 2018-06-21 MED ORDER — DABIGATRAN ETEXILATE MESYLATE 75 MG PO CAPS
75.0000 mg | ORAL_CAPSULE | Freq: Two times a day (BID) | ORAL | Status: DC
  Administered 2018-06-21 – 2018-06-22 (×2): 75 mg via ORAL
  Filled 2018-06-21 (×2): qty 1

## 2018-06-21 MED ORDER — INSULIN ASPART 100 UNIT/ML ~~LOC~~ SOLN
0.0000 [IU] | Freq: Three times a day (TID) | SUBCUTANEOUS | Status: DC
  Administered 2018-06-21: 1 [IU] via SUBCUTANEOUS

## 2018-06-21 MED ORDER — SODIUM CHLORIDE 0.9 % IV SOLN
INTRAVENOUS | Status: DC | PRN
  Administered 2018-06-21: 14:00:00 via INTRAVENOUS

## 2018-06-21 MED ORDER — ACETAMINOPHEN 650 MG RE SUPP: 650.0000 mg | Freq: Four times a day (QID) | RECTAL | Status: DC | PRN

## 2018-06-21 MED ORDER — LEVOTHYROXINE SODIUM 137 MCG PO TABS
137.0000 ug | ORAL_TABLET | Freq: Every day | ORAL | Status: DC
  Administered 2018-06-22: 137 ug via ORAL
  Filled 2018-06-21: qty 1

## 2018-06-21 MED ORDER — SODIUM CHLORIDE 0.9 % IV SOLN
1.0000 g | INTRAVENOUS | Status: DC
  Administered 2018-06-21 – 2018-06-22 (×2): 1 g via INTRAVENOUS
  Filled 2018-06-21 (×2): qty 10

## 2018-06-21 MED ORDER — ALPRAZOLAM 0.5 MG PO TABS: 1.0000 mg | ORAL_TABLET | Freq: Four times a day (QID) | ORAL | Status: DC | PRN

## 2018-06-21 NOTE — Plan of Care (Signed)

## 2018-06-21 NOTE — Care Management Obs Status (Signed)
[facility] NOTIFICATION   Patient Details  Name: Alan Orr MRN: [id number] Date of Birth: 08/21/1945   Medicare Observation Status Notification Given:  Yes    Trish Mage, LCSW 06/21/2018, 3:44 PM

## 2018-06-21 NOTE — H&P (Signed)
History and Physical    Alan Orr DVV:[id number] DOB: 1945/04/10 DOA: 06/21/2018  PCP: [name], MD  Patient coming from: Home  I have personally briefly reviewed patient's old medical records in [place]  Chief Complaint: Erythema of right leg  HPI: Alan Orr is a 73 y.o. male with medical history significant for CAD s/p CABG, ischemic cardiomyopathy, Hx of Vtach s/p BiV ICD, paroxysmal A. fib on Pradaxa, type 2 diabetes, hypertension, hypothyroidism, and hyperlipidemia who presents to the ED with erythema of his right lower extremity.  He says he was scratching his leg 2 or 3 days ago and afterwards noticed progressive redness and pain in his right lower extremity around his mid shin.  He denies any associated fevers, chills, diaphoresis, chest pain, dyspnea, or abdominal pain.  He is on Pradaxa anticoagulation for his history of paroxysmal atrial fibrillation.  ED Course:  Initial vitals showed BP 139/57, pulse 70, RR 16, temp 98 Fahrenheit, SPO2 95% on room air.  Labs are notable for WBC 8.8, hemoglobin 13.3, platelets 185, BUN 19, creatinine 1.35, GFR 52.  Patient was started on IV vancomycin and the hospital service was consulted to admit for cellulitis.  Review of Systems: As per HPI otherwise 10 point review of systems negative.    Past Medical History:  Diagnosis Date  . CAD (coronary artery disease)   . DM (diabetes mellitus) ([place])    type 2   . DVT (deep venous thrombosis) ([place])   . Hyperlipidemia   . Hypertension   . ICD (implantable cardioverter-defibrillator), single, in situ   . Ischemic cardiomyopathy   . PAF (paroxysmal atrial fibrillation) ([place])   . RBBB   . S/P CABG x 5     Past Surgical History:  Procedure Laterality Date  . BACK SURGERY    . CARDIAC CATHETERIZATION  05/08/2009   small vessel disease  . CARDIAC DEFIBRILLATOR PLACEMENT  05/09/2009   [employer]  . CORONARY ARTERY BYPASS GRAFT  12/31/1995   LIMA to LAD,SVG to  intermediate,SVG to CX,seq. svg to posterior descending and posterolateral RCA  . EP IMPLANTABLE DEVICE N/A 09/25/2015   Procedure: PPM/BIV PPM Generator Changeout;  Surgeon: Evans Lance, MD;  Location: [facility];  Service: Cardiovascular;  Laterality: N/A;  . I&D EXTREMITY Left 11/03/2013   Procedure: Left Leg Debride Ulcer, Apply Wound VAC and Theraskin;  Surgeon: Newt Minion, MD;  Location: [facility];  Service: Orthopedics;  Laterality: Left;  . TEE WITHOUT CARDIOVERSION N/A 01/11/2015   Procedure: TRANSESOPHAGEAL ECHOCARDIOGRAM (TEE);  Surgeon: Herminio Commons, MD;  [facility];  Service: Cardiology;  Laterality: N/A;     reports that he quit smoking about 22 years ago. His smoking use included cigarettes. He has a 52.50 pack-year smoking history. He has never used smokeless tobacco. He reports current alcohol use. He reports that he does not use drugs.  No Known Allergies  Family History  Problem Relation Age of Onset  . Heart attack Mother   . Stroke Father      Prior to Admission medications   Medication Sig Start Date End Date Taking? Authorizing Provider  ALPRAZolam Alan Orr) 1 MG tablet Take 1 tablet (1 mg total) by mouth daily as needed for anxiety or sleep. Patient taking differently: Take 1 mg by mouth 4 (four) times daily.  10/22/16  Yes Alan Dike, MD  amiodarone (PACERONE) 200 MG tablet Take 200 mg by mouth daily.   Yes [provider]  aspirin EC 81 MG tablet Take 81 mg by mouth at bedtime.   Yes [provider]  atorvastatin (LIPITOR) 40 MG tablet Take 40 mg by mouth at bedtime.   Yes [provider]  dabigatran (PRADAXA) 75 MG CAPS capsule Take 1 capsule (75 mg total) by mouth 2 (two) times daily. SCHEDULE FOLLOW UP APPOINTMENT PRIOR TO NEXT REFILL REQUEST. 10/28/17  Yes Orr, Mihai, MD  ezetimibe (ZETIA) 10 MG tablet TAKE 1 TABLET ONCE DAILY FOR CHOLESTEROL. 01/04/17  Yes Alan Harp, MD  ferrous sulfate 325  (65 FE) MG tablet Take 325 mg by mouth daily with breakfast.   Yes [provider]  magnesium oxide (MAG-OX) 400 MG tablet Take 400 mg by mouth daily.   Yes [provider]  metoprolol tartrate (LOPRESSOR) 50 MG tablet Take 1 tablet (50 mg total) by mouth 2 (two) times daily. Please make overdue appt with Dr. Lovena Orr before anymore refills. 1st attempt 06/13/18  Yes Evans Lance, MD  Multiple Vitamin (MULTI-VITAMINS) TABS Take 1 tablet by mouth daily.   Yes [provider]  niacin (NIASPAN) 1000 MG CR tablet TAKE (1) TABLET BY MOUTH AT BEDTIME. 10/15/16  Yes Alan Hugger, MD  nitroGLYCERIN (NITROSTAT) 0.4 MG SL tablet Place 0.4 mg under the tongue every 5 (five) minutes x 3 doses as needed for chest pain. 10/31/15  Yes [provider]  oxyCODONE-acetaminophen (PERCOCET/ROXICET) 5-325 MG tablet Take 1 tablet by mouth every 4 (four) hours as needed for severe pain. 12/25/17  Yes Alan Christen, MD  ramipril (ALTACE) 2.5 MG capsule Take 2.5 mg by mouth at bedtime.  05/11/18  Yes [provider]  SYNTHROID 137 MCG tablet Take 137 mcg by mouth daily before breakfast.  07/20/16  Yes [provider]  zolpidem (AMBIEN) 10 MG tablet Take 10 mg by mouth at bedtime.  05/20/18  Yes [provider]    Physical Exam: Vitals:   06/21/18 1228 06/21/18 1229 06/21/18 1300 06/21/18 1400  BP: (!) 107/91  (!) 139/57 121/61  Pulse: 81  70 61  Resp: 18   (!) (P) 28  Temp: 98 F (36.7 C)     TempSrc: Oral     SpO2: 95%  95% 99%  Weight:  95.3 kg    Height:  6\' 2"  (1.88 m)      Constitutional: Chronically ill-appearing elderly man resting in bed, NAD, calm, comfortable hard of hearing. Eyes: PERRL, lids and conjunctivae normal ENMT: Mucous membranes are moist. Posterior pharynx clear of any exudate or lesions. Neck: normal, supple, no masses. Respiratory: clear to auscultation bilaterally, no wheezing, no crackles. Normal respiratory effort. No accessory  muscle use.  Cardiovascular: Regular rate and rhythm, no murmurs / rubs / gallops.  Trace RLE edema. 2+ pedal pulses.  ICD in place left chest wall. Abdomen: no tenderness, no masses palpated. No hepatosplenomegaly. Bowel sounds positive.  Musculoskeletal: no clubbing / cyanosis. No joint deformity upper and lower extremities. Good ROM, no contractures. Normal muscle tone.  Skin: Erythema anterior and medial RLE more on admission with approximately 2 cm blood blister.  No open wound or active drainage. Neurologic: CN 2-12 grossly intact. Sensation intact, Strength 5/5 in all 4.  Psychiatric: Normal judgment and insight. Alert and oriented x 3. Normal mood.   Labs on Admission: I have personally reviewed following labs and imaging studies  CBC: Recent Labs  Lab 06/21/18 1338  WBC 8.8  NEUTROABS 5.7  HGB 13.3  HCT 40.4  MCV  105.8*  PLT 401   Basic Metabolic Panel: Recent Labs  Lab 06/21/18 1338  NA 141  K 3.9  CL 105  CO2 28  GLUCOSE 113*  BUN 19  CREATININE 1.35*  CALCIUM 8.6*   GFR: Estimated Creatinine Clearance: 56.7 mL/min (A) (by C-G formula based on SCr of 1.35 mg/dL (H)). Liver Function Tests: No results for input(s): AST, ALT, ALKPHOS, BILITOT, PROT, ALBUMIN in the last 168 hours. No results for input(s): LIPASE, AMYLASE in the last 168 hours. No results for input(s): AMMONIA in the last 168 hours. Coagulation Profile: No results for input(s): INR, PROTIME in the last 168 hours. Cardiac Enzymes: No results for input(s): CKTOTAL, CKMB, CKMBINDEX, TROPONINI in the last 168 hours. BNP (last 3 results) No results for input(s): PROBNP in the last 8760 hours. HbA1C: No results for input(s): HGBA1C in the last 72 hours. CBG: No results for input(s): GLUCAP in the last 168 hours. Lipid Profile: No results for input(s): CHOL, HDL, LDLCALC, TRIG, CHOLHDL, LDLDIRECT in the last 72 hours. Thyroid Function Tests: No results for input(s): TSH, T4TOTAL, FREET4, T3FREE,  THYROIDAB in the last 72 hours. Anemia Panel: No results for input(s): VITAMINB12, FOLATE, FERRITIN, TIBC, IRON, RETICCTPCT in the last 72 hours. Urine analysis:    Component Value Date/Time   COLORURINE YELLOW 12/25/2017 0958   APPEARANCEUR CLEAR 12/25/2017 0958   LABSPEC 1.023 12/25/2017 0958   PHURINE 6.0 12/25/2017 0958   GLUCOSEU NEGATIVE 12/25/2017 0958   HGBUR NEGATIVE 12/25/2017 0958   BILIRUBINUR NEGATIVE 12/25/2017 0958   KETONESUR 5 (A) 12/25/2017 0958   PROTEINUR 30 (A) 12/25/2017 0958   UROBILINOGEN 0.2 01/05/2015 1245   NITRITE NEGATIVE 12/25/2017 0958   LEUKOCYTESUR NEGATIVE 12/25/2017 0958    Radiological Exams on Admission: No results found.  EKG: Not obtained.  Assessment/Plan Principal Problem:   Cellulitis of right lower extremity Active Problems:   Biventricular ICD (implantable cardioverter-defibrillator) in place   CAD s/p CABG    Hyperlipidemia   Paroxysmal atrial fibrillation (HCC)   DM type 2 (diabetes mellitus, type 2) ([place])  Alan Orr is a 73 y.o. male with medical history significant for CAD s/p CABG, ischemic cardiomyopathy, Hx of Vtach s/p BiV ICD, paroxysmal A. fib on Pradaxa, type 2 diabetes, hypertension, hypothyroidism, and hyperlipidemia who is admitted for cellulitis of the right lower extremity.  Cellulitis of right lower extremity: Likely secondary to irritation from frequent scratching. -Narrow antibiotics to IV ceftriaxone  Paroxysmal atrial fibrillation: Appears to be in sinus rhythm with controlled rate on admission. -Continue Pradaxa for anticoagulation -Continue home amiodarone and metoprolol  CAD s/p CABG: Chronic and stable.  He denies any chest pain. -Continue aspirin 81 mg daily -Continue metoprolol -Continue atorvastatin and Zetia  Hypertension: Currently stable. -Continue metoprolol and ramipril  Hyperlipidemia: -Continue atorvastatin and Zetia  Diet-controlled type 2 diabetes: -Check A1c -Sensitive  SSI  Hypothyroidism: -Continue home Synthroid  Chronic pain: -Continue home Percocet as needed  Anxiety: -Continue home Xanax as needed  DVT prophylaxis: Pradaxa Code Status: Full code, confirmed with patient Family Communication: None present on admission Disposition Plan: Likely discharge to home in 1 day Consults called: None Admission status: Observation   Zada Finders MD [facility] Pager [phone]  If 7PM-7AM, please contact night-coverage [email or web address]  06/21/2018, 3:14 PM

## 2018-06-21 NOTE — ED Triage Notes (Signed)
Pt c/o RT lower leg swelling and pain x 2-3 days. Denies injury. Redness and edema noted.

## 2018-06-21 NOTE — ED Provider Notes (Signed)
[facility] EMERGENCY DEPARTMENT Provider Note   CSN: [id number] Arrival date & time: 06/21/18  1219    History   Chief Complaint Chief Complaint  Patient presents with  . Leg Pain    HPI Alan Orr is a 73 y.o. male.     Erythema and swelling in the right lower extremity for the past 2 to 3 days.  No fever or chills.  Similar episode in the left lower extremity requiring surgery in the past.  Past medical history includes diabetes, CAD, hypertension, hyperlipidemia, ICD placement, cardiomyopathy, CABG, many others.  Severity is moderate.  Palpation makes symptoms worse     Past Medical History:  Diagnosis Date  . CAD (coronary artery disease)   . DM (diabetes mellitus) ([place])    type 2   . DVT (deep venous thrombosis) ([place])   . Hyperlipidemia   . Hypertension   . ICD (implantable cardioverter-defibrillator), single, in situ   . Ischemic cardiomyopathy   . PAF (paroxysmal atrial fibrillation) ([place])   . RBBB   . S/P CABG x 5     Patient Active Problem List   Diagnosis Date Noted  . Salmonella sepsis ([place]) 10/21/2016  . Salmonella gastroenteritis 10/20/2016  . Transaminasemia 10/18/2016  . Pancytopenia ([place]) 10/18/2016  . DM type 2 (diabetes mellitus, type 2) ([place]) 10/18/2016  . Thrombocytopenia ([place]) 08/22/2016  . Tick bite of right thigh 08/22/2016  . Abnormal serum protein electrophoresis 08/21/2014  . Leg wound, left 11/03/2013  . History of carotid artery disease 03/03/2013  . Cardiomyopathy, ischemic 11/08/2012  . CAD s/p CABG  11/08/2012  . Hyperlipidemia 11/08/2012  . Paroxysmal atrial fibrillation ([place]) 11/08/2012  . Ventricular tachycardia ([place]) 11/08/2012  . 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 PACEMAKER, PERMANENT 08/20/2009  . Biventricular ICD (implantable cardioverter-defibrillator) in place 08/20/2009    Past Surgical History:  Procedure Laterality Date  . BACK SURGERY    . CARDIAC  CATHETERIZATION  05/08/2009   small vessel disease  . CARDIAC DEFIBRILLATOR PLACEMENT  05/09/2009   [employer]  . CORONARY ARTERY BYPASS GRAFT  12/31/1995   LIMA to LAD,SVG to intermediate,SVG to CX,seq. svg to posterior descending and posterolateral RCA  . EP IMPLANTABLE DEVICE N/A 09/25/2015   Procedure: PPM/BIV PPM Generator Changeout;  Surgeon: Evans Lance, MD;  Location: [facility];  Service: Cardiovascular;  Laterality: N/A;  . I&D EXTREMITY Left 11/03/2013   Procedure: Left Leg Debride Ulcer, Apply Wound VAC and Theraskin;  Surgeon: Newt Minion, MD;  Location: [facility];  Service: Orthopedics;  Laterality: Left;  . TEE WITHOUT CARDIOVERSION N/A 01/11/2015   Procedure: TRANSESOPHAGEAL ECHOCARDIOGRAM (TEE);  Surgeon: Herminio Commons, MD;  [facility];  Service: Cardiology;  Laterality: N/A;        Home Medications    Prior to Admission medications   Medication Sig Start Date End Date Taking? Authorizing Provider  ALPRAZolam Duanne Moron) 1 MG tablet Take 1 tablet (1 mg total) by mouth daily as needed for anxiety or sleep. Patient taking differently: Take 1 mg by mouth 4 (four) times daily.  10/22/16   Kathie Dike, MD  amiodarone (PACERONE) 200 MG tablet Take 200 mg by mouth daily.    [provider]  aspirin EC 81 MG tablet Take 81 mg by mouth at bedtime.    [provider]  atorvastatin (LIPITOR) 40 MG tablet Take 40 mg by mouth at bedtime.    [provider]  dabigatran (PRADAXA) 75 MG CAPS capsule Take 1 capsule (75 mg total) by mouth 2 (two) times daily. SCHEDULE FOLLOW UP APPOINTMENT PRIOR TO NEXT REFILL REQUEST. 10/28/17   Croitoru, Mihai, MD  ezetimibe (ZETIA) 10 MG tablet TAKE 1 TABLET ONCE DAILY FOR CHOLESTEROL. 01/04/17   Lorretta Harp, MD  ferrous sulfate 325 (65 FE) MG tablet Take 325 mg by mouth daily with breakfast.    [provider]  magnesium oxide (MAG-OX) 400 MG tablet Take 400 mg by mouth daily.    [provider]  metoprolol tartrate (LOPRESSOR) 50 MG tablet Take 1 tablet (50 mg total) by mouth 2 (two) times daily. Please make overdue appt with Dr. Lovena Le before anymore refills. 1st attempt 06/13/18   Evans Lance, MD  Multiple Vitamin (MULTI-VITAMINS) TABS Take 1 tablet by mouth daily.    [provider]  niacin (NIASPAN) 1000 MG CR tablet TAKE (1) TABLET BY MOUTH AT BEDTIME. 10/15/16   Bayard Hugger, MD  nitroGLYCERIN (NITROSTAT) 0.4 MG SL tablet Place 0.4 mg under the tongue every 5 (five) minutes x 3 doses as needed for chest pain. 10/31/15   [provider]  oxyCODONE-acetaminophen (PERCOCET/ROXICET) 5-325 MG tablet Take 1 tablet by mouth every 4 (four) hours as needed for severe pain. 12/25/17   Nat Christen, MD  SYNTHROID 137 MCG tablet Take 137 mcg by mouth daily before breakfast.  07/20/16   [provider]    Family History Family History  Problem Relation Age of Onset  . Heart attack Mother   . Stroke Father     Social History Social History   Tobacco Use  . Smoking status: Former Smoker    Packs/day: 1.50    Years: 35.00    Pack years: 52.50    Types: Cigarettes    Last attempt to quit: 11/03/1995    Years since quitting: 22.6  . Smokeless tobacco: Never Used  Substance Use Topics  . Alcohol use: Yes    Comment: occasionally  . Drug use: No     Allergies   Patient has no known allergies.   Review of Systems Review of Systems  All other systems reviewed and are negative.    Physical Exam Updated Vital Signs BP (!) 107/91 (BP Location: Left Arm)   Pulse 81   Temp 98 F (36.7 C) (Oral)   Resp 18   Ht 6\' 2"  (1.88 m)   Wt 95.3 kg   SpO2 95%   BMI 26.96 kg/m   Physical Exam Vitals signs and nursing note reviewed.  Constitutional:      Appearance: He is well-developed.  HENT:     Head: Normocephalic and atraumatic.  Eyes:     Conjunctiva/sclera: Conjunctivae normal.  Neck:     Musculoskeletal: Neck supple.   Cardiovascular:     Rate and Rhythm: Normal rate and regular rhythm.  Pulmonary:     Effort: Pulmonary effort is normal.     Breath sounds: Normal breath sounds.  Abdominal:     General: Bowel sounds are normal.     Palpations: Abdomen is soft.  Musculoskeletal:     Comments: Right lower extremity: Area of erythema medial mid right calf with extension proximally to the knee.  Skin:    General: Skin is warm and dry.  Neurological:     Mental Status: He is alert and oriented to person, place, and time.  Psychiatric:        Behavior: Behavior normal.  ED Treatments / Results  Labs (all labs ordered are listed, but only abnormal results are displayed) Labs Reviewed  CBC WITH DIFFERENTIAL/PLATELET  BASIC METABOLIC PANEL    EKG None  Radiology No results found.  Procedures Procedures (including critical care time)  Medications Ordered in ED Medications  sodium chloride 0.9 % bolus 500 mL (has no administration in time range)     Initial Impression / Assessment and Plan / ED Course  I have reviewed the triage vital signs and the nursing notes.  Pertinent labs & imaging results that were available during my care of the patient were reviewed by me and considered in my medical decision making (see chart for details).        History and physical consistent with cellulitis.  IV vancomycin, likely admission to general medicine.  Final Clinical Impressions(s) / ED Diagnoses   Final diagnoses:  Cellulitis of right lower extremity    ED Discharge Orders    None       Nat Christen, MD 06/21/18 [phone]

## 2018-06-22 DIAGNOSIS — I2510 Atherosclerotic heart disease of native coronary artery without angina pectoris: Secondary | ICD-10-CM | POA: Diagnosis not present

## 2018-06-22 DIAGNOSIS — E7800 Pure hypercholesterolemia, unspecified: Secondary | ICD-10-CM

## 2018-06-22 DIAGNOSIS — I480 Paroxysmal atrial fibrillation: Secondary | ICD-10-CM | POA: Diagnosis not present

## 2018-06-22 DIAGNOSIS — L03115 Cellulitis of right lower limb: Secondary | ICD-10-CM | POA: Diagnosis not present

## 2018-06-22 LAB — BASIC METABOLIC PANEL
Anion gap: 6 (ref 5–15)
BUN: 15 mg/dL (ref 8–23)
CO2: 26 mmol/L (ref 22–32)
Calcium: 8.2 mg/dL — ABNORMAL LOW (ref 8.9–10.3)
Chloride: 107 mmol/L (ref 98–111)
Creatinine, Ser: 1.09 mg/dL (ref 0.61–1.24)
GFR calc Af Amer: >60 mL/min (ref >60)
GFR calc non Af Amer: >60 mL/min (ref >60)
Glucose, Bld: 111 mg/dL — ABNORMAL HIGH (ref 70–99)
Potassium: 4.2 mmol/L (ref 3.5–5.1)
Sodium: 139 mmol/L (ref 135–145)

## 2018-06-22 LAB — GLUCOSE, CAPILLARY
Glucose-Capillary: 154 mg/dL — ABNORMAL HIGH (ref 70–99)
Glucose-Capillary: 101 mg/dL — ABNORMAL HIGH (ref 70–99)
Glucose-Capillary: 116 mg/dL — ABNORMAL HIGH (ref 70–99)

## 2018-06-22 LAB — CBC
HCT: 39.5 % (ref 39.0–52.0)
Hemoglobin: 12.8 g/dL — ABNORMAL LOW (ref 13.0–17.0)
MCH: 35 pg — ABNORMAL HIGH (ref 26.0–34.0)
MCHC: 32.4 g/dL (ref 30.0–36.0)
MCV: 107.9 fL — ABNORMAL HIGH (ref 80.0–100.0)
Platelets: 171 10*3/uL (ref 150–400)
RBC: 3.66 MIL/uL — ABNORMAL LOW (ref 4.22–5.81)
RDW: 14.1 % (ref 11.5–15.5)
WBC: 7.8 10*3/uL (ref 4.0–10.5)
nRBC: 0 % (ref 0.0–0.2)

## 2018-06-22 MED ORDER — AMOXICILLIN-POT CLAVULANATE 875-125 MG PO TABS: 1.0000 | ORAL_TABLET | Freq: Two times a day (BID) | ORAL | 0 refills | Status: AC

## 2018-06-22 MED ORDER — DOXYCYCLINE HYCLATE 100 MG PO CAPS: 100.0000 mg | ORAL_CAPSULE | Freq: Two times a day (BID) | ORAL | 0 refills | Status: DC

## 2018-06-22 NOTE — Discharge Summary (Signed)
Physician Discharge Summary  Gracen Southwell Girten SJG:[id number] DOB: 1945-09-12 DOA: 06/21/2018  PCP: [name], MD  Admit date: 06/21/2018 Discharge date: 06/22/2018  Admitted From: Home Disposition: Home  Recommendations for Outpatient Follow-up:  1. Follow up with PCP in 1-2 weeks 2. Please obtain BMP/CBC in one week  Home Health: Home health RN Equipment/Devices:   Discharge Condition: Stable CODE STATUS: Full code Diet recommendation: Heart healthy, carb modified  Brief/Interim Summary: 73 year old male admitted to the hospital with right lower extremity cellulitis.  Has a history of hypertension, diabetes, cardiomyopathy, coronary disease.  He had significant blistering and erythema in his right lower leg, extending up his right calf towards his right popliteal fossa.  He was treated with intravenous vancomycin and Rocephin.  By the following day, patient had substantial improvement.  Overall erythema has greatly improved.  He continues to have some blistering lesions, but anticipate this should improve as his course continues.  He is not having fevers and has no leukocytosis.  He has been transitioned to oral antibiotics.  He can set up with home health RN to continue to monitor his infection.  The remainder of his medical problems remained stable.  Discharge Diagnoses:  Principal Problem:   Cellulitis of right lower extremity Active Problems:   Biventricular ICD (implantable cardioverter-defibrillator) in place   CAD s/p CABG    Hyperlipidemia   Paroxysmal atrial fibrillation (HCC)   DM type 2 (diabetes mellitus, type 2) ([place])    Discharge Instructions  Discharge Instructions    Diet - low sodium heart healthy   Complete by:  As directed    Increase activity slowly   Complete by:  As directed      Allergies as of 06/22/2018   No Known Allergies     Medication List    TAKE these medications   ALPRAZolam 1 MG tablet Commonly known as:  XANAX Take 1 tablet (1 mg  total) by mouth daily as needed for anxiety or sleep. What changed:  when to take this   amiodarone 200 MG tablet Commonly known as:  PACERONE Take 200 mg by mouth daily.   amoxicillin-clavulanate 875-125 MG tablet Commonly known as:  Augmentin Take 1 tablet by mouth 2 (two) times daily for 7 days.   aspirin EC 81 MG tablet Take 81 mg by mouth at bedtime.   atorvastatin 40 MG tablet Commonly known as:  LIPITOR Take 40 mg by mouth at bedtime.   dabigatran 75 MG Caps capsule Commonly known as:  Pradaxa Take 1 capsule (75 mg total) by mouth 2 (two) times daily. SCHEDULE FOLLOW UP APPOINTMENT PRIOR TO NEXT REFILL REQUEST.   doxycycline 100 MG capsule Commonly known as:  VIBRAMYCIN Take 1 capsule (100 mg total) by mouth 2 (two) times daily.   ezetimibe 10 MG tablet Commonly known as:  ZETIA TAKE 1 TABLET ONCE DAILY FOR CHOLESTEROL.   ferrous sulfate 325 (65 FE) MG tablet Take 325 mg by mouth daily with breakfast.   magnesium oxide 400 MG tablet Commonly known as:  MAG-OX Take 400 mg by mouth daily.   metoprolol tartrate 50 MG tablet Commonly known as:  LOPRESSOR Take 1 tablet (50 mg total) by mouth 2 (two) times daily. Please make overdue appt with Dr. Lovena Le before anymore refills. 1st attempt   Multi-Vitamins Tabs Take 1 tablet by mouth daily.   niacin 1000 MG CR tablet Commonly known as:  NIASPAN TAKE (1) TABLET BY MOUTH AT BEDTIME.   nitroGLYCERIN 0.4 MG SL tablet  Commonly known as:  NITROSTAT Place 0.4 mg under the tongue every 5 (five) minutes x 3 doses as needed for chest pain.   oxyCODONE-acetaminophen 5-325 MG tablet Commonly known as:  PERCOCET/ROXICET Take 1 tablet by mouth every 4 (four) hours as needed for severe pain.   ramipril 2.5 MG capsule Commonly known as:  ALTACE Take 2.5 mg by mouth at bedtime.   Synthroid 137 MCG tablet Generic drug:  levothyroxine Take 137 mcg by mouth daily before breakfast.   zolpidem 10 MG tablet Commonly known  as:  AMBIEN Take 10 mg by mouth at bedtime.      Follow-up Information    [facility] Follow up.   Contact information: [place] [phone]         No Known Allergies  Consultations:     Procedures/Studies: No results found.    Subjective:  Feeling better.  Overall pain in lower extremity is better.  Feels the swelling and erythema is better. Discharge Exam: Vitals:   06/21/18 1633 06/21/18 2022 06/21/18 2204 06/22/18 0543  BP: (!) 148/57  (!) 126/55 120/82  Pulse: 62  (!) 55 (!) 56  Resp: 20  20   Temp: 97.9 F (36.6 C)  98.3 F (36.8 C) 98.1 F (36.7 C)  TempSrc: Oral  Oral Oral  SpO2: 99% 92% 98% 96%  Weight: 91 kg     Height: 6\' 2"  (1.88 m)       General: Pt is alert, awake, not in acute distress Cardiovascular: RRR, S1/S2 +, no rubs, no gallops Respiratory: CTA bilaterally, no wheezing, no rhonchi Abdominal: Soft, NT, ND, bowel sounds + Extremities: no edema, no cyanosis      The results of significant diagnostics from this hospitalization (including imaging, microbiology, ancillary and laboratory) are listed below for reference.     Microbiology: No results found for this or any previous visit (from the past 240 hour(s)).   Labs: BNP (last 3 results) No results for input(s): BNP in the last 8760 hours. Basic Metabolic Panel: Recent Labs  Lab 06/21/18 1338 06/22/18 0551  NA 141 139  K 3.9 4.2  CL 105 107  CO2 28 26  GLUCOSE 113* 111*  BUN 19 15  CREATININE 1.35* 1.09  CALCIUM 8.6* 8.2*   Liver Function Tests: No results for input(s): AST, ALT, ALKPHOS, BILITOT, PROT, ALBUMIN in the last 168 hours. No results for input(s): LIPASE, AMYLASE in the last 168 hours. No results for input(s): AMMONIA in the last 168 hours. CBC: Recent Labs  Lab 06/21/18 1338 06/22/18 0551  WBC 8.8 7.8  NEUTROABS 5.7  --   HGB 13.3 12.8*  HCT 40.4 39.5  MCV 105.8* 107.9*  PLT 185 171    Cardiac Enzymes: No results for input(s): CKTOTAL, CKMB, CKMBINDEX, TROPONINI in the last 168 hours. BNP: Invalid input(s): POCBNP CBG: Recent Labs  Lab 06/21/18 1635 06/21/18 2201 06/22/18 0716 06/22/18 1219  GLUCAP 146* 154* 101* 116*   D-Dimer No results for input(s): DDIMER in the last 72 hours. Hgb A1c Recent Labs    06/21/18 1338  HGBA1C 5.9*   Lipid Profile No results for input(s): CHOL, HDL, LDLCALC, TRIG, CHOLHDL, LDLDIRECT in the last 72 hours. Thyroid function studies No results for input(s): TSH, T4TOTAL, T3FREE, THYROIDAB in the last 72 hours.  Invalid input(s): FREET3 Anemia work up No results for input(s): VITAMINB12, FOLATE, FERRITIN, TIBC, IRON, RETICCTPCT in the last 72 hours. Urinalysis    Component  Value Date/Time   COLORURINE YELLOW 12/25/2017 0958   APPEARANCEUR CLEAR 12/25/2017 0958   LABSPEC 1.023 12/25/2017 0958   PHURINE 6.0 12/25/2017 0958   GLUCOSEU NEGATIVE 12/25/2017 0958   HGBUR NEGATIVE 12/25/2017 0958   BILIRUBINUR NEGATIVE 12/25/2017 0958   KETONESUR 5 (A) 12/25/2017 0958   PROTEINUR 30 (A) 12/25/2017 0958   UROBILINOGEN 0.2 01/05/2015 1245   NITRITE NEGATIVE 12/25/2017 0958   LEUKOCYTESUR NEGATIVE 12/25/2017 0958   Sepsis Labs Invalid input(s): PROCALCITONIN,  WBC,  LACTICIDVEN Microbiology No results found for this or any previous visit (from the past 240 hour(s)).   Time coordinating discharge: 67mins  SIGNED:   Kathie Dike, MD  [facility] 06/22/2018, 7:35 PM   If 7PM-7AM, please contact night-coverage [email or web address]

## 2018-06-22 NOTE — Progress Notes (Deleted)
Physician Discharge Summary  Alan Orr UJW:[id number] DOB: [birth year] DOA: 06/21/2018  PCP: [name], MD  Admit date: 06/21/2018 Discharge date: 06/22/2018  Admitted From: Home Disposition: Home  Recommendations for Outpatient Follow-up:  1. Follow up with PCP in 1-2 weeks 2. Please obtain BMP/CBC in one week  Home Health: Home health RN Equipment/Devices:   Discharge Condition: Stable CODE STATUS: Full code Diet recommendation: Heart healthy, carb modified  Brief/Interim Summary: 73 year old male admitted to the hospital with right lower extremity cellulitis.  Has a history of hypertension, diabetes, cardiomyopathy, coronary disease.  He had significant blistering and erythema in his right lower leg, extending up his right calf towards his right popliteal fossa.  He was treated with intravenous vancomycin and Rocephin.  By the following day, patient had substantial improvement.  Overall erythema has greatly improved.  He continues to have some blistering lesions, but anticipate this should improve as his course continues.  He is not having fevers and has no leukocytosis.  He has been transitioned to oral antibiotics.  He can set up with home health RN to continue to monitor his infection.  The remainder of his medical problems remained stable.  Discharge Diagnoses:  Principal Problem:   Cellulitis of right lower extremity Active Problems:   Biventricular ICD (implantable cardioverter-defibrillator) in place   CAD s/p CABG    Hyperlipidemia   Paroxysmal atrial fibrillation (HCC)   DM type 2 (diabetes mellitus, type 2) ([place])    Discharge Instructions  Discharge Instructions    Diet - low sodium heart healthy   Complete by:  As directed    Increase activity slowly   Complete by:  As directed      Allergies as of 06/22/2018   No Known Allergies     Medication List    TAKE these medications   ALPRAZolam 1 MG tablet Commonly known as:  XANAX Take 1 tablet (1 mg  total) by mouth daily as needed for anxiety or sleep. What changed:  when to take this   amiodarone 200 MG tablet Commonly known as:  PACERONE Take 200 mg by mouth daily.   amoxicillin-clavulanate 875-125 MG tablet Commonly known as:  Augmentin Take 1 tablet by mouth 2 (two) times daily for 7 days.   aspirin EC 81 MG tablet Take 81 mg by mouth at bedtime.   atorvastatin 40 MG tablet Commonly known as:  LIPITOR Take 40 mg by mouth at bedtime.   dabigatran 75 MG Caps capsule Commonly known as:  Pradaxa Take 1 capsule (75 mg total) by mouth 2 (two) times daily. SCHEDULE FOLLOW UP APPOINTMENT PRIOR TO NEXT REFILL REQUEST.   doxycycline 100 MG capsule Commonly known as:  VIBRAMYCIN Take 1 capsule (100 mg total) by mouth 2 (two) times daily.   ezetimibe 10 MG tablet Commonly known as:  ZETIA TAKE 1 TABLET ONCE DAILY FOR CHOLESTEROL.   ferrous sulfate 325 (65 FE) MG tablet Take 325 mg by mouth daily with breakfast.   magnesium oxide 400 MG tablet Commonly known as:  MAG-OX Take 400 mg by mouth daily.   metoprolol tartrate 50 MG tablet Commonly known as:  LOPRESSOR Take 1 tablet (50 mg total) by mouth 2 (two) times daily. Please make overdue appt with Dr. Lovena Le before anymore refills. 1st attempt   Multi-Vitamins Tabs Take 1 tablet by mouth daily.   niacin 1000 MG CR tablet Commonly known as:  NIASPAN TAKE (1) TABLET BY MOUTH AT BEDTIME.   nitroGLYCERIN 0.4 MG SL tablet  Commonly known as:  NITROSTAT Place 0.4 mg under the tongue every 5 (five) minutes x 3 doses as needed for chest pain.   oxyCODONE-acetaminophen 5-325 MG tablet Commonly known as:  PERCOCET/ROXICET Take 1 tablet by mouth every 4 (four) hours as needed for severe pain.   ramipril 2.5 MG capsule Commonly known as:  ALTACE Take 2.5 mg by mouth at bedtime.   Synthroid 137 MCG tablet Generic drug:  levothyroxine Take 137 mcg by mouth daily before breakfast.   zolpidem 10 MG tablet Commonly known  as:  AMBIEN Take 10 mg by mouth at bedtime.       No Known Allergies  Consultations:     Procedures/Studies:  No results found.    Subjective:  Feeling better.  Overall pain in lower extremity is better.  Feels the swelling and erythema is better. Discharge Exam: Vitals:   06/21/18 1633 06/21/18 2022 06/21/18 2204 06/22/18 0543  BP: (!) 148/57  (!) 126/55 120/82  Pulse: 62  (!) 55 (!) 56  Resp: 20  20   Temp: 97.9 F (36.6 C)  98.3 F (36.8 C) 98.1 F (36.7 C)  TempSrc: Oral  Oral Oral  SpO2: 99% 92% 98% 96%  Weight: 91 kg     Height: 6\' 2"  (1.88 m)       General: Pt is alert, awake, not in acute distress Cardiovascular: RRR, S1/S2 +, no rubs, no gallops Respiratory: CTA bilaterally, no wheezing, no rhonchi Abdominal: Soft, NT, ND, bowel sounds + Extremities: no edema, no cyanosis      The results of significant diagnostics from this hospitalization (including imaging, microbiology, ancillary and laboratory) are listed below for reference.     Microbiology: No results found for this or any previous visit (from the past 240 hour(s)).   Labs: BNP (last 3 results) No results for input(s): BNP in the last 8760 hours. Basic Metabolic Panel: Recent Labs  Lab 06/21/18 1338 06/22/18 0551  NA 141 139  K 3.9 4.2  CL 105 107  CO2 28 26  GLUCOSE 113* 111*  BUN 19 15  CREATININE 1.35* 1.09  CALCIUM 8.6* 8.2*   Liver Function Tests: No results for input(s): AST, ALT, ALKPHOS, BILITOT, PROT, ALBUMIN in the last 168 hours. No results for input(s): LIPASE, AMYLASE in the last 168 hours. No results for input(s): AMMONIA in the last 168 hours. CBC: Recent Labs  Lab 06/21/18 1338 06/22/18 0551  WBC 8.8 7.8  NEUTROABS 5.7  --   HGB 13.3 12.8*  HCT 40.4 39.5  MCV 105.8* 107.9*  PLT 185 171   Cardiac Enzymes: No results for input(s): CKTOTAL, CKMB, CKMBINDEX, TROPONINI in the last 168 hours. BNP: Invalid input(s): POCBNP CBG: Recent Labs  Lab  06/21/18 1635 06/21/18 2201 06/22/18 0716 06/22/18 1219  GLUCAP 146* 154* 101* 116*   D-Dimer No results for input(s): DDIMER in the last 72 hours. Hgb A1c Recent Labs    06/21/18 1338  HGBA1C 5.9*   Lipid Profile No results for input(s): CHOL, HDL, LDLCALC, TRIG, CHOLHDL, LDLDIRECT in the last 72 hours. Thyroid function studies No results for input(s): TSH, T4TOTAL, T3FREE, THYROIDAB in the last 72 hours.  Invalid input(s): FREET3 Anemia work up No results for input(s): VITAMINB12, FOLATE, FERRITIN, TIBC, IRON, RETICCTPCT in the last 72 hours. Urinalysis    Component Value Date/Time   COLORURINE YELLOW 12/25/2017 0958   APPEARANCEUR CLEAR 12/25/2017 0958   LABSPEC 1.023 12/25/2017 0958   PHURINE 6.0 12/25/2017 0958   GLUCOSEU NEGATIVE  12/25/2017 [place] 12/25/2017 [place] 12/25/2017 0958   KETONESUR 5 (A) 12/25/2017 0958   PROTEINUR 30 (A) 12/25/2017 0958   UROBILINOGEN 0.2 01/05/2015 1245   NITRITE NEGATIVE 12/25/2017 0958   LEUKOCYTESUR NEGATIVE 12/25/2017 0958   Sepsis Labs Invalid input(s): PROCALCITONIN,  WBC,  LACTICIDVEN Microbiology No results found for this or any previous visit (from the past 240 hour(s)).   Time coordinating discharge: 61mins  SIGNED:   Kathie Dike, MD  [facility] 06/22/2018, 1:35 PM   If 7PM-7AM, please contact night-coverage [email or web address]

## 2018-06-22 NOTE — Plan of Care (Signed)

## 2018-06-22 NOTE — TOC Transition Note (Signed)
Transition of Care [facility]) - CM/SW Discharge Note   Patient Details  Name: Alan Orr MRN: [id number] Date of Birth: [birth year]  Transition of Care [facility]) CM/SW Contact:  Keishawn Darsey, Chauncey Reading, RN Phone Number: 06/22/2018, 1:27 PM   Clinical Narrative:   Patient has cellulitis. Lives in [place] [phone] D). Independent. Has PCP, still drives. Agreeable to home health RN for cellulitis of legs. Discussed CMS list/options.  No preference on agencies. Referral given to [facility].        Barriers to Discharge: No Barriers Identified   Patient Goals and CMS Choice Patient states their goals for this hospitalization and ongoing recovery are:: get "out of this hospital"  CMS Medicare.gov Compare Post Acute Care list provided to:: Patient Choice offered to / list presented to : Patient     Discharge Plan and Services   Discharge Planning Services: CM Consult Post Acute Care Choice: Home Health              HH Arranged: RN [facility] Agency: [facility] [facility]    Readmission Risk Interventions No flowsheet data found.

## 2018-06-22 NOTE — Discharge Instructions (Signed)

## 2018-06-23 DIAGNOSIS — E1151 Type 2 diabetes mellitus with diabetic peripheral angiopathy without gangrene: Secondary | ICD-10-CM | POA: Diagnosis not present

## 2018-06-23 DIAGNOSIS — Z95810 Presence of automatic (implantable) cardiac defibrillator: Secondary | ICD-10-CM | POA: Diagnosis not present

## 2018-06-23 DIAGNOSIS — I2510 Atherosclerotic heart disease of native coronary artery without angina pectoris: Secondary | ICD-10-CM | POA: Diagnosis not present

## 2018-06-23 DIAGNOSIS — I480 Paroxysmal atrial fibrillation: Secondary | ICD-10-CM | POA: Diagnosis not present

## 2018-06-23 DIAGNOSIS — G8929 Other chronic pain: Secondary | ICD-10-CM | POA: Diagnosis not present

## 2018-06-23 DIAGNOSIS — Z79891 Long term (current) use of opiate analgesic: Secondary | ICD-10-CM | POA: Diagnosis not present

## 2018-06-23 DIAGNOSIS — I872 Venous insufficiency (chronic) (peripheral): Secondary | ICD-10-CM | POA: Diagnosis not present

## 2018-06-23 DIAGNOSIS — Z4800 Encounter for change or removal of nonsurgical wound dressing: Secondary | ICD-10-CM | POA: Diagnosis not present

## 2018-06-23 DIAGNOSIS — L03115 Cellulitis of right lower limb: Secondary | ICD-10-CM | POA: Diagnosis not present

## 2018-06-23 DIAGNOSIS — Z86718 Personal history of other venous thrombosis and embolism: Secondary | ICD-10-CM | POA: Diagnosis not present

## 2018-06-23 DIAGNOSIS — Z951 Presence of aortocoronary bypass graft: Secondary | ICD-10-CM | POA: Diagnosis not present

## 2018-06-23 DIAGNOSIS — E785 Hyperlipidemia, unspecified: Secondary | ICD-10-CM | POA: Diagnosis not present

## 2018-06-23 DIAGNOSIS — Z7982 Long term (current) use of aspirin: Secondary | ICD-10-CM | POA: Diagnosis not present

## 2018-06-25 DIAGNOSIS — Z86718 Personal history of other venous thrombosis and embolism: Secondary | ICD-10-CM | POA: Diagnosis not present

## 2018-06-25 DIAGNOSIS — E785 Hyperlipidemia, unspecified: Secondary | ICD-10-CM | POA: Diagnosis not present

## 2018-06-25 DIAGNOSIS — I872 Venous insufficiency (chronic) (peripheral): Secondary | ICD-10-CM | POA: Diagnosis not present

## 2018-06-25 DIAGNOSIS — Z7982 Long term (current) use of aspirin: Secondary | ICD-10-CM | POA: Diagnosis not present

## 2018-06-25 DIAGNOSIS — Z951 Presence of aortocoronary bypass graft: Secondary | ICD-10-CM | POA: Diagnosis not present

## 2018-06-25 DIAGNOSIS — Z95810 Presence of automatic (implantable) cardiac defibrillator: Secondary | ICD-10-CM | POA: Diagnosis not present

## 2018-06-25 DIAGNOSIS — L03115 Cellulitis of right lower limb: Secondary | ICD-10-CM | POA: Diagnosis not present

## 2018-06-25 DIAGNOSIS — I480 Paroxysmal atrial fibrillation: Secondary | ICD-10-CM | POA: Diagnosis not present

## 2018-06-25 DIAGNOSIS — Z4800 Encounter for change or removal of nonsurgical wound dressing: Secondary | ICD-10-CM | POA: Diagnosis not present

## 2018-06-25 DIAGNOSIS — E1151 Type 2 diabetes mellitus with diabetic peripheral angiopathy without gangrene: Secondary | ICD-10-CM | POA: Diagnosis not present

## 2018-06-25 DIAGNOSIS — G8929 Other chronic pain: Secondary | ICD-10-CM | POA: Diagnosis not present

## 2018-06-25 DIAGNOSIS — Z79891 Long term (current) use of opiate analgesic: Secondary | ICD-10-CM | POA: Diagnosis not present

## 2018-06-25 DIAGNOSIS — I2510 Atherosclerotic heart disease of native coronary artery without angina pectoris: Secondary | ICD-10-CM | POA: Diagnosis not present

## 2018-06-27 DIAGNOSIS — I872 Venous insufficiency (chronic) (peripheral): Secondary | ICD-10-CM | POA: Diagnosis not present

## 2018-06-27 DIAGNOSIS — Z4800 Encounter for change or removal of nonsurgical wound dressing: Secondary | ICD-10-CM | POA: Diagnosis not present

## 2018-06-27 DIAGNOSIS — I2510 Atherosclerotic heart disease of native coronary artery without angina pectoris: Secondary | ICD-10-CM | POA: Diagnosis not present

## 2018-06-27 DIAGNOSIS — E1151 Type 2 diabetes mellitus with diabetic peripheral angiopathy without gangrene: Secondary | ICD-10-CM | POA: Diagnosis not present

## 2018-06-27 DIAGNOSIS — Z7982 Long term (current) use of aspirin: Secondary | ICD-10-CM | POA: Diagnosis not present

## 2018-06-27 DIAGNOSIS — Z951 Presence of aortocoronary bypass graft: Secondary | ICD-10-CM | POA: Diagnosis not present

## 2018-06-27 DIAGNOSIS — Z79891 Long term (current) use of opiate analgesic: Secondary | ICD-10-CM | POA: Diagnosis not present

## 2018-06-27 DIAGNOSIS — E785 Hyperlipidemia, unspecified: Secondary | ICD-10-CM | POA: Diagnosis not present

## 2018-06-27 DIAGNOSIS — Z95810 Presence of automatic (implantable) cardiac defibrillator: Secondary | ICD-10-CM | POA: Diagnosis not present

## 2018-06-27 DIAGNOSIS — L03115 Cellulitis of right lower limb: Secondary | ICD-10-CM | POA: Diagnosis not present

## 2018-06-27 DIAGNOSIS — Z86718 Personal history of other venous thrombosis and embolism: Secondary | ICD-10-CM | POA: Diagnosis not present

## 2018-06-27 DIAGNOSIS — G8929 Other chronic pain: Secondary | ICD-10-CM | POA: Diagnosis not present

## 2018-06-27 DIAGNOSIS — I480 Paroxysmal atrial fibrillation: Secondary | ICD-10-CM | POA: Diagnosis not present

## 2018-06-29 DIAGNOSIS — L03119 Cellulitis of unspecified part of limb: Secondary | ICD-10-CM | POA: Diagnosis not present

## 2018-06-29 DIAGNOSIS — G894 Chronic pain syndrome: Secondary | ICD-10-CM | POA: Diagnosis not present

## 2018-06-29 DIAGNOSIS — M1991 Primary osteoarthritis, unspecified site: Secondary | ICD-10-CM | POA: Diagnosis not present

## 2018-06-29 DIAGNOSIS — S80921A Unspecified superficial injury of right lower leg, initial encounter: Secondary | ICD-10-CM | POA: Diagnosis not present

## 2018-06-29 DIAGNOSIS — E1129 Type 2 diabetes mellitus with other diabetic kidney complication: Secondary | ICD-10-CM | POA: Diagnosis not present

## 2018-07-02 DIAGNOSIS — Z79891 Long term (current) use of opiate analgesic: Secondary | ICD-10-CM | POA: Diagnosis not present

## 2018-07-02 DIAGNOSIS — Z4800 Encounter for change or removal of nonsurgical wound dressing: Secondary | ICD-10-CM | POA: Diagnosis not present

## 2018-07-02 DIAGNOSIS — Z7982 Long term (current) use of aspirin: Secondary | ICD-10-CM | POA: Diagnosis not present

## 2018-07-02 DIAGNOSIS — G8929 Other chronic pain: Secondary | ICD-10-CM | POA: Diagnosis not present

## 2018-07-02 DIAGNOSIS — I2510 Atherosclerotic heart disease of native coronary artery without angina pectoris: Secondary | ICD-10-CM | POA: Diagnosis not present

## 2018-07-02 DIAGNOSIS — Z86718 Personal history of other venous thrombosis and embolism: Secondary | ICD-10-CM | POA: Diagnosis not present

## 2018-07-02 DIAGNOSIS — Z951 Presence of aortocoronary bypass graft: Secondary | ICD-10-CM | POA: Diagnosis not present

## 2018-07-02 DIAGNOSIS — I872 Venous insufficiency (chronic) (peripheral): Secondary | ICD-10-CM | POA: Diagnosis not present

## 2018-07-02 DIAGNOSIS — L03115 Cellulitis of right lower limb: Secondary | ICD-10-CM | POA: Diagnosis not present

## 2018-07-02 DIAGNOSIS — I480 Paroxysmal atrial fibrillation: Secondary | ICD-10-CM | POA: Diagnosis not present

## 2018-07-02 DIAGNOSIS — E785 Hyperlipidemia, unspecified: Secondary | ICD-10-CM | POA: Diagnosis not present

## 2018-07-02 DIAGNOSIS — Z95810 Presence of automatic (implantable) cardiac defibrillator: Secondary | ICD-10-CM | POA: Diagnosis not present

## 2018-07-02 DIAGNOSIS — E1151 Type 2 diabetes mellitus with diabetic peripheral angiopathy without gangrene: Secondary | ICD-10-CM | POA: Diagnosis not present

## 2018-07-05 DIAGNOSIS — L03115 Cellulitis of right lower limb: Secondary | ICD-10-CM | POA: Diagnosis not present

## 2018-07-05 DIAGNOSIS — Z951 Presence of aortocoronary bypass graft: Secondary | ICD-10-CM | POA: Diagnosis not present

## 2018-07-05 DIAGNOSIS — G8929 Other chronic pain: Secondary | ICD-10-CM | POA: Diagnosis not present

## 2018-07-05 DIAGNOSIS — Z4800 Encounter for change or removal of nonsurgical wound dressing: Secondary | ICD-10-CM | POA: Diagnosis not present

## 2018-07-05 DIAGNOSIS — Z95810 Presence of automatic (implantable) cardiac defibrillator: Secondary | ICD-10-CM | POA: Diagnosis not present

## 2018-07-05 DIAGNOSIS — Z7982 Long term (current) use of aspirin: Secondary | ICD-10-CM | POA: Diagnosis not present

## 2018-07-05 DIAGNOSIS — Z86718 Personal history of other venous thrombosis and embolism: Secondary | ICD-10-CM | POA: Diagnosis not present

## 2018-07-05 DIAGNOSIS — I2510 Atherosclerotic heart disease of native coronary artery without angina pectoris: Secondary | ICD-10-CM | POA: Diagnosis not present

## 2018-07-05 DIAGNOSIS — E785 Hyperlipidemia, unspecified: Secondary | ICD-10-CM | POA: Diagnosis not present

## 2018-07-05 DIAGNOSIS — I872 Venous insufficiency (chronic) (peripheral): Secondary | ICD-10-CM | POA: Diagnosis not present

## 2018-07-05 DIAGNOSIS — E1151 Type 2 diabetes mellitus with diabetic peripheral angiopathy without gangrene: Secondary | ICD-10-CM | POA: Diagnosis not present

## 2018-07-05 DIAGNOSIS — I480 Paroxysmal atrial fibrillation: Secondary | ICD-10-CM | POA: Diagnosis not present

## 2018-07-05 DIAGNOSIS — Z79891 Long term (current) use of opiate analgesic: Secondary | ICD-10-CM | POA: Diagnosis not present

## 2018-07-11 DIAGNOSIS — Z95810 Presence of automatic (implantable) cardiac defibrillator: Secondary | ICD-10-CM | POA: Diagnosis not present

## 2018-07-11 DIAGNOSIS — E1151 Type 2 diabetes mellitus with diabetic peripheral angiopathy without gangrene: Secondary | ICD-10-CM | POA: Diagnosis not present

## 2018-07-11 DIAGNOSIS — G8929 Other chronic pain: Secondary | ICD-10-CM | POA: Diagnosis not present

## 2018-07-11 DIAGNOSIS — I480 Paroxysmal atrial fibrillation: Secondary | ICD-10-CM | POA: Diagnosis not present

## 2018-07-11 DIAGNOSIS — Z4800 Encounter for change or removal of nonsurgical wound dressing: Secondary | ICD-10-CM | POA: Diagnosis not present

## 2018-07-11 DIAGNOSIS — I872 Venous insufficiency (chronic) (peripheral): Secondary | ICD-10-CM | POA: Diagnosis not present

## 2018-07-11 DIAGNOSIS — Z7982 Long term (current) use of aspirin: Secondary | ICD-10-CM | POA: Diagnosis not present

## 2018-07-11 DIAGNOSIS — E785 Hyperlipidemia, unspecified: Secondary | ICD-10-CM | POA: Diagnosis not present

## 2018-07-11 DIAGNOSIS — Z79891 Long term (current) use of opiate analgesic: Secondary | ICD-10-CM | POA: Diagnosis not present

## 2018-07-11 DIAGNOSIS — I2510 Atherosclerotic heart disease of native coronary artery without angina pectoris: Secondary | ICD-10-CM | POA: Diagnosis not present

## 2018-07-11 DIAGNOSIS — L03115 Cellulitis of right lower limb: Secondary | ICD-10-CM | POA: Diagnosis not present

## 2018-07-11 DIAGNOSIS — Z951 Presence of aortocoronary bypass graft: Secondary | ICD-10-CM | POA: Diagnosis not present

## 2018-07-11 DIAGNOSIS — Z86718 Personal history of other venous thrombosis and embolism: Secondary | ICD-10-CM | POA: Diagnosis not present

## 2018-07-19 DIAGNOSIS — Z79891 Long term (current) use of opiate analgesic: Secondary | ICD-10-CM | POA: Diagnosis not present

## 2018-07-19 DIAGNOSIS — L03115 Cellulitis of right lower limb: Secondary | ICD-10-CM | POA: Diagnosis not present

## 2018-07-19 DIAGNOSIS — Z7982 Long term (current) use of aspirin: Secondary | ICD-10-CM | POA: Diagnosis not present

## 2018-07-19 DIAGNOSIS — I480 Paroxysmal atrial fibrillation: Secondary | ICD-10-CM | POA: Diagnosis not present

## 2018-07-19 DIAGNOSIS — G8929 Other chronic pain: Secondary | ICD-10-CM | POA: Diagnosis not present

## 2018-07-19 DIAGNOSIS — Z951 Presence of aortocoronary bypass graft: Secondary | ICD-10-CM | POA: Diagnosis not present

## 2018-07-19 DIAGNOSIS — I2510 Atherosclerotic heart disease of native coronary artery without angina pectoris: Secondary | ICD-10-CM | POA: Diagnosis not present

## 2018-07-19 DIAGNOSIS — Z4800 Encounter for change or removal of nonsurgical wound dressing: Secondary | ICD-10-CM | POA: Diagnosis not present

## 2018-07-19 DIAGNOSIS — I872 Venous insufficiency (chronic) (peripheral): Secondary | ICD-10-CM | POA: Diagnosis not present

## 2018-07-19 DIAGNOSIS — Z95810 Presence of automatic (implantable) cardiac defibrillator: Secondary | ICD-10-CM | POA: Diagnosis not present

## 2018-07-19 DIAGNOSIS — Z86718 Personal history of other venous thrombosis and embolism: Secondary | ICD-10-CM | POA: Diagnosis not present

## 2018-07-19 DIAGNOSIS — E785 Hyperlipidemia, unspecified: Secondary | ICD-10-CM | POA: Diagnosis not present

## 2018-07-19 DIAGNOSIS — E1151 Type 2 diabetes mellitus with diabetic peripheral angiopathy without gangrene: Secondary | ICD-10-CM | POA: Diagnosis not present

## 2018-08-19 ENCOUNTER — Other Ambulatory Visit: Payer: Self-pay | Admitting: Cardiovascular Disease

## 2018-08-22 ENCOUNTER — Telehealth: Payer: Self-pay

## 2018-08-22 NOTE — Telephone Encounter (Signed)
What is this PA for?

## 2018-08-25 DIAGNOSIS — I4891 Unspecified atrial fibrillation: Secondary | ICD-10-CM | POA: Diagnosis not present

## 2018-08-25 DIAGNOSIS — E119 Type 2 diabetes mellitus without complications: Secondary | ICD-10-CM | POA: Diagnosis not present

## 2018-08-25 DIAGNOSIS — M1991 Primary osteoarthritis, unspecified site: Secondary | ICD-10-CM | POA: Diagnosis not present

## 2018-08-25 DIAGNOSIS — G894 Chronic pain syndrome: Secondary | ICD-10-CM | POA: Diagnosis not present

## 2018-08-28 NOTE — Telephone Encounter (Signed)
PA completed on covermymeds; recall in epic for ROV

## 2018-08-29 ENCOUNTER — Encounter: Payer: Medicare Other | Admitting: *Deleted

## 2018-08-30 ENCOUNTER — Telehealth: Payer: Self-pay

## 2018-08-30 NOTE — Telephone Encounter (Signed)
Unable to speak  with patient to remind of missed remote transmission 

## 2018-08-31 ENCOUNTER — Telehealth: Payer: Self-pay | Admitting: Internal Medicine

## 2018-08-31 NOTE — Telephone Encounter (Signed)
[name] are there Pradaxa samples at the office?

## 2018-08-31 NOTE — Telephone Encounter (Signed)
  Patient calling the office for samples of medication:   1.  What medication and dosage are you requesting samples for?  dabigatran (PRADAXA) 75 MG CAPS capsule  2.  Are you currently out of this medication? Yes  Been out for two weeks. Having issues getting it filled. He is waiting on his refill to come in

## 2018-09-01 NOTE — Telephone Encounter (Signed)
This is Dr. Kennon Holter pt. Please address

## 2018-09-02 NOTE — Telephone Encounter (Signed)
New Message    Patient calling back in for samples stating he hasn't had them for 2 weeks.

## 2018-09-02 NOTE — Telephone Encounter (Signed)
Okay to stop taking ASA 325mg . Patient was on ASA 81mg  prior to recent changes.  Recommendation:  1. Resume Pradaxa 75mg  BID  2. Continue ASA 81mg  daily until follow up with DR Lovena Le

## 2018-09-02 NOTE — Telephone Encounter (Signed)
Notified nephew of [place] recommendations. Verbalized understanding

## 2018-09-02 NOTE — Telephone Encounter (Signed)
Medication samples have been provided to the patient.  Drug name: pradaxa 75mg   Qty: 3 boxes  LOT: [id number]  Exp.Date: 11/2018  Samples left at front desk for patient pick-up. Nephew notified  Patient is waiting on patient assistance - turned in application to PCP for this but he is out of the office Has been out of medication for almost 2 weeks  Alan Orr 12:44 PM 09/02/2018

## 2018-09-02 NOTE — Telephone Encounter (Signed)
Patient was advised to take ASA 325mg  daily until he could pradaxa 75mg  BID medication from either patient assistance or samples. He was told this by PCP office. PCP office does patient assistance paperwork but he does not have the medication yet and does not know status of application. Routed to [facility] for med advice assistance - stop ASA when resumes pradaxa?

## 2018-09-06 ENCOUNTER — Encounter: Payer: Self-pay | Admitting: Cardiology

## 2018-09-22 DIAGNOSIS — G894 Chronic pain syndrome: Secondary | ICD-10-CM | POA: Diagnosis not present

## 2018-09-22 DIAGNOSIS — M1991 Primary osteoarthritis, unspecified site: Secondary | ICD-10-CM | POA: Diagnosis not present

## 2018-10-08 ENCOUNTER — Other Ambulatory Visit: Payer: Self-pay | Admitting: Internal Medicine

## 2018-10-24 DIAGNOSIS — G894 Chronic pain syndrome: Secondary | ICD-10-CM | POA: Diagnosis not present

## 2018-10-24 DIAGNOSIS — I5022 Chronic systolic (congestive) heart failure: Secondary | ICD-10-CM | POA: Diagnosis not present

## 2018-11-02 ENCOUNTER — Encounter: Payer: Medicare Other | Admitting: Internal Medicine

## 2018-11-18 ENCOUNTER — Ambulatory Visit: Payer: Medicare Other | Admitting: Family

## 2018-11-18 DIAGNOSIS — M2550 Pain in unspecified joint: Secondary | ICD-10-CM | POA: Diagnosis not present

## 2018-11-18 DIAGNOSIS — M1991 Primary osteoarthritis, unspecified site: Secondary | ICD-10-CM | POA: Diagnosis not present

## 2018-11-18 DIAGNOSIS — Z23 Encounter for immunization: Secondary | ICD-10-CM | POA: Diagnosis not present

## 2018-11-18 DIAGNOSIS — G894 Chronic pain syndrome: Secondary | ICD-10-CM | POA: Diagnosis not present

## 2018-11-22 ENCOUNTER — Telehealth: Payer: Self-pay | Admitting: Orthopedic Surgery

## 2018-11-22 ENCOUNTER — Ambulatory Visit: Payer: Medicare Other | Admitting: Orthopedic Surgery

## 2018-11-22 ENCOUNTER — Ambulatory Visit (INDEPENDENT_AMBULATORY_CARE_PROVIDER_SITE_OTHER): Payer: Medicare Other | Admitting: Orthopedic Surgery

## 2018-11-22 ENCOUNTER — Encounter: Payer: Self-pay | Admitting: Orthopedic Surgery

## 2018-11-22 ENCOUNTER — Ambulatory Visit: Payer: Self-pay

## 2018-11-22 VITALS — Ht 74.0 in | Wt 204.0 lb

## 2018-11-22 DIAGNOSIS — S52135A Nondisplaced fracture of neck of left radius, initial encounter for closed fracture: Secondary | ICD-10-CM | POA: Diagnosis not present

## 2018-11-22 DIAGNOSIS — M25522 Pain in left elbow: Secondary | ICD-10-CM | POA: Diagnosis not present

## 2018-11-22 NOTE — Telephone Encounter (Signed)
Called patient got recording voicemail has not been set up yet   Could not reach patient     Called Driscilla Moats [facility])  Other contact person left message on voicemail to have patient call  To reschedule appointment for 2:00 pm today

## 2018-11-29 ENCOUNTER — Encounter: Payer: Self-pay | Admitting: Orthopedic Surgery

## 2018-11-29 NOTE — Progress Notes (Signed)
Office Visit Note   Patient: Alan Orr           Date of Birth: 1945/07/31           MRN: [id number] Visit Date: 11/22/2018              Requested by: [name], [place],  [place] [id number] PCP: [name], MD  Chief Complaint  Patient presents with  . Left Elbow - Injury    S/p fall on 11/12/2018      HPI: Patient is a 73 year old gentleman who states that he fell on an outstretched left arm about 4 weeks ago.  Patient states he had swelling that will not resolve.  Patient states that he is on a blood thinner Pradaxa.  Assessment & Plan: Visit Diagnoses:  1. Pain in left elbow   2. Closed nondisplaced fracture of neck of left radius, initial encounter     Plan: Patient will continue with range of motion activities for the elbow no lifting with the left arm reevaluate in 3 weeks with repeat radiographs of the left elbow.  Follow-Up Instructions: Return in about 3 weeks (around 12/13/2018).   Ortho Exam  Patient is alert, oriented, no adenopathy, well-dressed, normal affect, normal respiratory effort. Examination patient has a small hematoma directly over the radial neck.  He is tender to palpation over the radial head he has full supination pronation flexion and extension of the elbow.  Imaging: No results found. No images are attached to the encounter.  Labs: Lab Results  Component Value Date   HGBA1C 5.9 (H) 06/21/2018   HGBA1C 5.9 (H) 08/23/2016   HGBA1C 5.6 01/07/2015   ESRSEDRATE 20 (H) 10/22/2016   REPTSTATUS 10/19/2016 FINAL 10/17/2016   CULT  10/17/2016    NO GROWTH Performed at [facility], [place] [address], [place], [place] [address]    [place] 01/05/2015     Lab Results  Component Value Date   ALBUMIN 3.5 12/25/2017   ALBUMIN 2.4 (L) 10/22/2016   ALBUMIN 2.4 (L) 10/21/2016    Lab Results  Component Value Date   MG 2.0 08/23/2016   MG 2.2 07/14/2009   MG 2.3 05/07/2009    No results found for: VD25OH  No results found for: PREALBUMIN CBC EXTENDED Latest Ref Rng & Units 06/22/2018 06/21/2018 12/25/2017  WBC 4.0 - 10.5 K/uL 7.8 8.8 9.0  RBC 4.22 - 5.81 MIL/uL 3.66(L) 3.82(L) 4.21(L)  HGB 13.0 - 17.0 g/dL 12.8(L) 13.3 15.0  HCT 39.0 - 52.0 % 39.5 40.4 45.1  PLT 150 - 400 K/uL 171 185 206  NEUTROABS 1.7 - 7.7 K/uL - 5.7 6.9  LYMPHSABS 0.7 - 4.0 K/uL - 2.2 1.1     Body mass index is 26.19 kg/m.  Orders:  Orders Placed This Encounter  Procedures  . XR Elbow Complete Left (3+View)   No orders of the defined types were placed in this encounter.    Procedures: No procedures performed  Clinical Data: No additional findings.  ROS:  All other systems negative, except as noted in the HPI. Review of Systems  Objective: Vital Signs: Ht 6\' 2"  (1.88 m)   Wt 204 lb (92.5 kg)   BMI 26.19 kg/m   Specialty Comments:  No specialty comments available.  PMFS History: Patient Active Problem List   Diagnosis Date Noted  . Cellulitis of right lower extremity 06/21/2018  . Salmonella sepsis ([place]) 10/21/2016  . Salmonella gastroenteritis 10/20/2016  . Transaminasemia  10/18/2016  . Pancytopenia ([place]) 10/18/2016  . DM type 2 (diabetes mellitus, type 2) ([place]) 10/18/2016  . Thrombocytopenia ([place]) 08/22/2016  . Tick bite of right thigh 08/22/2016  . Abnormal serum protein electrophoresis 08/21/2014  . Leg wound, left 11/03/2013  . History of carotid artery disease 03/03/2013  . Cardiomyopathy, ischemic 11/08/2012  . CAD s/p CABG  11/08/2012  . Hyperlipidemia 11/08/2012  . Paroxysmal atrial fibrillation ([place]) 11/08/2012  . Ventricular tachycardia ([place]) 11/08/2012  . Type 2 diabetes mellitus with stage 3 chronic kidney disease ([place]) 08/20/2009  . PACEMAKER, PERMANENT 08/20/2009  . Biventricular ICD (implantable cardioverter-defibrillator) in place 08/20/2009   Past Medical History:  Diagnosis Date  . CAD (coronary artery disease)   . DM (diabetes  mellitus) ([place])    type 2   . DVT (deep venous thrombosis) ([place])   . Hyperlipidemia   . Hypertension   . ICD (implantable cardioverter-defibrillator), single, in situ   . Ischemic cardiomyopathy   . PAF (paroxysmal atrial fibrillation) ([place])   . RBBB   . S/P CABG x 5     Family History  Problem Relation Age of Onset  . Heart attack Mother   . Stroke Father     Past Surgical History:  Procedure Laterality Date  . BACK SURGERY    . CARDIAC CATHETERIZATION  05/08/2009   small vessel disease  . CARDIAC DEFIBRILLATOR PLACEMENT  05/09/2009   [employer]  . CORONARY ARTERY BYPASS GRAFT  12/31/1995   LIMA to LAD,SVG to intermediate,SVG to CX,seq. svg to posterior descending and posterolateral RCA  . EP IMPLANTABLE DEVICE N/A 09/25/2015   Procedure: PPM/BIV PPM Generator Changeout;  Surgeon: Evans Lance, MD;  Location: [facility];  Service: Cardiovascular;  Laterality: N/A;  . I&D EXTREMITY Left 11/03/2013   Procedure: Left Leg Debride Ulcer, Apply Wound VAC and [place];  Surgeon: Newt Minion, MD;  Location: [facility];  Service: Orthopedics;  Laterality: Left;  . TEE WITHOUT CARDIOVERSION N/A 01/11/2015   Procedure: TRANSESOPHAGEAL ECHOCARDIOGRAM (TEE);  Surgeon: Herminio Commons, MD;  Location: [facility];  Service: Cardiology;  Laterality: N/A;   Social History   Occupational History  . Not on file  Tobacco Use  . Smoking status: Former Smoker    Packs/day: 1.50    Years: 35.00    Pack years: 52.50    Types: Cigarettes    Quit date: 11/03/1995    Years since quitting: 23.0  . Smokeless tobacco: Never Used  Substance and Sexual Activity  . Alcohol use: Yes    Comment: occasionally  . Drug use: No  . Sexual activity: Not on file

## 2018-12-15 DIAGNOSIS — G894 Chronic pain syndrome: Secondary | ICD-10-CM | POA: Diagnosis not present

## 2018-12-15 DIAGNOSIS — M1991 Primary osteoarthritis, unspecified site: Secondary | ICD-10-CM | POA: Diagnosis not present

## 2019-01-13 ENCOUNTER — Emergency Department (HOSPITAL_COMMUNITY)
Admission: EM | Admit: 2019-01-13 | Discharge: 2019-01-13 | Disposition: A | Discharge status: Home / Self Care | Payer: Medicare Other | Attending: Emergency Medicine | Admitting: Emergency Medicine

## 2019-01-13 ENCOUNTER — Encounter (HOSPITAL_COMMUNITY): Payer: Self-pay

## 2019-01-13 ENCOUNTER — Other Ambulatory Visit: Payer: Self-pay

## 2019-01-13 DIAGNOSIS — B372 Candidiasis of skin and nail: Secondary | ICD-10-CM | POA: Diagnosis not present

## 2019-01-13 DIAGNOSIS — Z95810 Presence of automatic (implantable) cardiac defibrillator: Secondary | ICD-10-CM | POA: Insufficient documentation

## 2019-01-13 DIAGNOSIS — Z7982 Long term (current) use of aspirin: Secondary | ICD-10-CM | POA: Diagnosis not present

## 2019-01-13 DIAGNOSIS — Z79899 Other long term (current) drug therapy: Secondary | ICD-10-CM | POA: Diagnosis not present

## 2019-01-13 DIAGNOSIS — Z7901 Long term (current) use of anticoagulants: Secondary | ICD-10-CM | POA: Diagnosis not present

## 2019-01-13 DIAGNOSIS — E119 Type 2 diabetes mellitus without complications: Secondary | ICD-10-CM | POA: Insufficient documentation

## 2019-01-13 DIAGNOSIS — I10 Essential (primary) hypertension: Secondary | ICD-10-CM | POA: Insufficient documentation

## 2019-01-13 DIAGNOSIS — Z87891 Personal history of nicotine dependence: Secondary | ICD-10-CM | POA: Diagnosis not present

## 2019-01-13 DIAGNOSIS — N5089 Other specified disorders of the male genital organs: Secondary | ICD-10-CM | POA: Insufficient documentation

## 2019-01-13 DIAGNOSIS — N501 Vascular disorders of male genital organs: Secondary | ICD-10-CM

## 2019-01-13 DIAGNOSIS — I2510 Atherosclerotic heart disease of native coronary artery without angina pectoris: Secondary | ICD-10-CM | POA: Diagnosis not present

## 2019-01-13 DIAGNOSIS — B379 Candidiasis, unspecified: Secondary | ICD-10-CM | POA: Diagnosis not present

## 2019-01-13 DIAGNOSIS — Z951 Presence of aortocoronary bypass graft: Secondary | ICD-10-CM | POA: Insufficient documentation

## 2019-01-13 MED ORDER — CLOTRIMAZOLE 1 % EX CREA
TOPICAL_CREAM | CUTANEOUS | Status: AC
  Filled 2019-01-13: qty 15

## 2019-01-13 MED ORDER — LIDOCAINE-EPINEPHRINE-TETRACAINE (LET) SOLUTION
3.0000 mL | Freq: Once | NASAL | Status: AC
  Administered 2019-01-13: 3 mL via TOPICAL
  Filled 2019-01-13: qty 3

## 2019-01-13 MED ORDER — CLOTRIMAZOLE 1 % EX CREA
TOPICAL_CREAM | Freq: Two times a day (BID) | CUTANEOUS | Status: DC
  Administered 2019-01-13: 20:00:00 via TOPICAL
  Filled 2019-01-13: qty 15

## 2019-01-13 NOTE — Discharge Instructions (Addendum)
As discussed, you may continue to have minor oozing from your wound, and if this is the case, please apply pressure. Otherwise, please be sure to follow-up with our urology colleagues for additional evaluation of your scrotal lesion.  Please do not take tonight's dose of Pradaxa.  You may resume taking it as normal tomorrow.  Return here for concerning changes in your condition.

## 2019-01-13 NOTE — ED Provider Notes (Signed)
[facility] EMERGENCY DEPARTMENT Provider Note   CSN: [id number] Arrival date & time: 01/13/19  1738     History   Chief Complaint Chief Complaint  Patient presents with  . Scrotal Bleeding    HPI Alan Orr is a 73 y.o. male.     HPI Patient presents with concern of bleeding from his scrotum. Patient notes history of multiple medical issues including CABG, defibrillator placement, take blood thinners. Without clear precipitant patient began to have bleeding from his scrotum yesterday. Since that time he has had multiple episodes of bleeding, without pain. No lightheadedness, no chest pain, no dyspnea either.  Patient has tried resting, salve application, but continues to have episodes of bleeding. Past Medical History:  Diagnosis Date  . CAD (coronary artery disease)   . DM (diabetes mellitus) ([place])    type 2   . DVT (deep venous thrombosis) ([place])   . Hyperlipidemia   . Hypertension   . ICD (implantable cardioverter-defibrillator), single, in situ   . Ischemic cardiomyopathy   . PAF (paroxysmal atrial fibrillation) ([place])   . RBBB   . S/P CABG x 5     Patient Active Problem List   Diagnosis Date Noted  . Cellulitis of right lower extremity 06/21/2018  . Salmonella sepsis ([place]) 10/21/2016  . Salmonella gastroenteritis 10/20/2016  . Transaminasemia 10/18/2016  . Pancytopenia ([place]) 10/18/2016  . DM type 2 (diabetes mellitus, type 2) ([place]) 10/18/2016  . Thrombocytopenia ([place]) 08/22/2016  . Tick bite of right thigh 08/22/2016  . Abnormal serum protein electrophoresis 08/21/2014  . Leg wound, left 11/03/2013  . History of carotid artery disease 03/03/2013  . Cardiomyopathy, ischemic 11/08/2012  . CAD s/p CABG  11/08/2012  . Hyperlipidemia 11/08/2012  . Paroxysmal atrial fibrillation ([place]) 11/08/2012  . Ventricular tachycardia ([place]) 11/08/2012  . Type 2 diabetes mellitus with stage 3 chronic kidney disease ([place]) 08/20/2009  . PACEMAKER, PERMANENT 08/20/2009   . Biventricular ICD (implantable cardioverter-defibrillator) in place 08/20/2009    Past Surgical History:  Procedure Laterality Date  . BACK SURGERY    . CARDIAC CATHETERIZATION  05/08/2009   small vessel disease  . CARDIAC DEFIBRILLATOR PLACEMENT  05/09/2009   Medtronic  . CORONARY ARTERY BYPASS GRAFT  12/31/1995   LIMA to LAD,SVG to intermediate,SVG to CX,seq. svg to posterior descending and posterolateral RCA  . EP IMPLANTABLE DEVICE N/A 09/25/2015   Procedure: PPM/BIV PPM Generator Changeout;  Surgeon: Evans Lance, MD;  Location: [facility];  Service: Cardiovascular;  Laterality: N/A;  . I&D EXTREMITY Left 11/03/2013   Procedure: Left Leg Debride Ulcer, Apply Wound VAC and Theraskin;  Surgeon: Newt Minion, MD;  Location: [facility];  Service: Orthopedics;  Laterality: Left;  . TEE WITHOUT CARDIOVERSION N/A 01/11/2015   Procedure: TRANSESOPHAGEAL ECHOCARDIOGRAM (TEE);  Surgeon: Herminio Commons, MD;  Location: [facility];  Service: Cardiology;  Laterality: N/A;        Home Medications    Prior to Admission medications   Medication Sig Start Date End Date Taking? Authorizing Provider  ALPRAZolam Duanne Moron) 1 MG tablet Take 1 tablet (1 mg total) by mouth daily as needed for anxiety or sleep. Patient taking differently: Take 1 mg by mouth 4 (four) times daily.  10/22/16   Kathie Dike, MD  amiodarone (PACERONE) 200 MG tablet Take 200 mg by mouth daily.    [provider]  aspirin EC 81 MG tablet Take 81 mg by mouth at bedtime.    [provider]  atorvastatin (LIPITOR) 40 MG tablet Take 40 mg by mouth at bedtime.    [provider]  dabigatran (PRADAXA) 75 MG CAPS capsule Take 1 capsule (75 mg total) by mouth 2 (two) times daily. OV NEEDED 08/19/18   Croitoru, Dani Gobble, MD  doxycycline (VIBRAMYCIN) 100 MG capsule Take 1 capsule (100 mg total) by mouth 2 (two) times daily. 06/22/18   Kathie Dike, MD  ezetimibe (ZETIA) 10 MG tablet TAKE 1 TABLET ONCE  DAILY FOR CHOLESTEROL. 01/04/17   Lorretta Harp, MD  ferrous sulfate 325 (65 FE) MG tablet Take 325 mg by mouth daily with breakfast.    [provider]  magnesium oxide (MAG-OX) 400 MG tablet Take 400 mg by mouth daily.    [provider]  metoprolol tartrate (LOPRESSOR) 50 MG tablet TAKE 1 TABLET BY MOUTH TWICE DAILY. 10/10/18   Evans Lance, MD  Multiple Vitamin (MULTI-VITAMINS) TABS Take 1 tablet by mouth daily.    [provider]  niacin (NIASPAN) 1000 MG CR tablet TAKE (1) TABLET BY MOUTH AT BEDTIME. 10/15/16   Bayard Hugger, MD  nitroGLYCERIN (NITROSTAT) 0.4 MG SL tablet Place 0.4 mg under the tongue every 5 (five) minutes x 3 doses as needed for chest pain. 10/31/15   [provider]  oxyCODONE-acetaminophen (PERCOCET/ROXICET) 5-325 MG tablet Take 1 tablet by mouth every 4 (four) hours as needed for severe pain. 12/25/17   Nat Christen, MD  ramipril (ALTACE) 2.5 MG capsule Take 2.5 mg by mouth at bedtime.  05/11/18   [provider]  SYNTHROID 137 MCG tablet Take 137 mcg by mouth daily before breakfast.  07/20/16   [provider]  zolpidem (AMBIEN) 10 MG tablet Take 10 mg by mouth at bedtime.  05/20/18   [provider]    Family History Family History  Problem Relation Age of Onset  . Heart attack Mother   . Stroke Father     Social History Social History   Tobacco Use  . Smoking status: Former Smoker    Packs/day: 1.50    Years: 35.00    Pack years: 52.50    Types: Cigarettes    Quit date: 11/03/1995    Years since quitting: 23.2  . Smokeless tobacco: Never Used  Substance Use Topics  . Alcohol use: Yes    Comment: occasionally  . Drug use: No     Allergies   Patient has no known allergies.   Review of Systems Review of Systems  Constitutional:       Per HPI, otherwise negative  HENT:       Per HPI, otherwise negative  Respiratory:       Per HPI, otherwise negative  Cardiovascular:       Per HPI,  otherwise negative  Gastrointestinal: Negative for vomiting.  Endocrine:       Negative aside from HPI  Genitourinary:       Neg aside from HPI   Musculoskeletal:       Per HPI, otherwise negative  Skin: Positive for color change and wound.  Neurological: Negative for syncope.  Hematological: Bruises/bleeds easily.     Physical Exam Updated Vital Signs BP (!) 131/53 (BP Location: Right Arm)   Pulse (!) 51   Temp 98 F (36.7 C) (Tympanic)   Resp 16   Ht 6\' 2"  (1.88 m)   Wt 92.5 kg   SpO2 100%   BMI 26.19 kg/m   Physical Exam Vitals signs and nursing note reviewed.  Constitutional:      General: He is not in acute distress.    Appearance: He is well-developed.     Comments: Chronically ill-appearing thin adult male awake, alert, in no distress.  HENT:     Head: Normocephalic and atraumatic.  Eyes:     Conjunctiva/sclera: Conjunctivae normal.  Cardiovascular:     Rate and Rhythm: Normal rate and regular rhythm.  Pulmonary:     Effort: Pulmonary effort is normal. No respiratory distress.     Breath sounds: No stridor.  Abdominal:     General: There is no distension.  Genitourinary:   Skin:    General: Skin is warm and dry.  Neurological:     Mental Status: He is alert and oriented to person, place, and time.      ED Treatments / Results  Labs (all labs ordered are listed, but only abnormal results are displayed) Labs Reviewed - No data to display  EKG None  Radiology No results found.  Procedures Procedures (including critical care time)  Medications Ordered in ED Medications  clotrimazole (LOTRIMIN) 1 % cream ( Topical Given 01/13/19 1933)  lidocaine-EPINEPHrine-tetracaine (LET) solution (3 mLs Topical Given 01/13/19 1933)  clotrimazole (LOTRIMIN) 1 % cream (has no administration in time range)     Initial Impression / Assessment and Plan / ED Course  I have reviewed the triage vital signs and the nursing notes.  Pertinent labs & imaging  results that were available during my care of the patient were reviewed by me and considered in my medical decision making (see chart for details).    8:46 PM On repeat exam after the patient had cleaning of his genitals.  There is a 0.5 cm minimally raised lesion consistent with mole or skin tag, with no active bleeding, but with evidence for recent small bleed. No surrounding erythema, no tenderness to palpation. Patient's inguinal creases bilaterally consistent with yeast infection as well.   Patient had application of L ET infused gauze to the irritated area, as well as provision of clotrimazole to the bilateral inguinal crease yeast rash. 8:46 PM Patient remains hemodynamically unremarkable, no ongoing complaints, no bleeding from the wound itself, now after application of topical medication with epinephrine. With no ongoing bleeding, no hemodynamic instability, the patient is appropriate for discharge with urology follow-up for definitive care, consideration of excision of his skin tag. Patient has no other scrotal pain, no evidence for epididymitis or other new additional scrotal pathology. No hemodynamic stability.  Patient will hold 1 dose of Pradaxa. Patient is now accompanied by his wife with whom we discussed all findings, return precautions, follow-up instructions as well.  Final Clinical Impressions(s) / ED Diagnoses   Final diagnoses:  Scrotal bleeding  Yeast infection     Carmin Muskrat, MD 01/13/19 2049

## 2019-01-13 NOTE — ED Triage Notes (Signed)
Pt presents to ED with complaints of scrotal bleeding since last night. Pt with blood noted on pants and down both legs.

## 2019-02-27 ENCOUNTER — Ambulatory Visit (INDEPENDENT_AMBULATORY_CARE_PROVIDER_SITE_OTHER): Payer: Medicare Other | Admitting: *Deleted

## 2019-02-27 DIAGNOSIS — Z950 Presence of cardiac pacemaker: Secondary | ICD-10-CM | POA: Diagnosis not present

## 2019-02-27 LAB — CUP PACEART REMOTE DEVICE CHECK
Battery Remaining Longevity: 82 mo
Battery Voltage: 3.02 V
Brady Statistic AP VP Percent: 48.94 %
Brady Statistic AP VS Percent: 0.03 %
Brady Statistic AS VP Percent: 51 %
Brady Statistic AS VS Percent: 0.03 %
Brady Statistic RA Percent Paced: 48.92 %
Brady Statistic RV Percent Paced: 99.84 %
Date Time Interrogation Session: 20201214195759
Implantable Lead Implant Date: 20110224
Implantable Lead Implant Date: 20110224
Implantable Lead Implant Date: 20110224
Implantable Lead Location: 753858
Implantable Lead Location: 753859
Implantable Lead Location: 753860
Implantable Lead Model: 4196
Implantable Lead Model: 5076
Implantable Lead Model: 6947
Implantable Pulse Generator Implant Date: 20170712
Lead Channel Impedance Value: 1159 Ohm
Lead Channel Impedance Value: 380 Ohm
Lead Channel Impedance Value: 475 Ohm
Lead Channel Impedance Value: 551 Ohm
Lead Channel Impedance Value: 570 Ohm
Lead Channel Impedance Value: 589 Ohm
Lead Channel Impedance Value: 684 Ohm
Lead Channel Impedance Value: 741 Ohm
Lead Channel Impedance Value: 817 Ohm
Lead Channel Pacing Threshold Amplitude: 0.625 V
Lead Channel Pacing Threshold Amplitude: 0.75 V
Lead Channel Pacing Threshold Amplitude: 1 V
Lead Channel Pacing Threshold Pulse Width: 0.4 ms
Lead Channel Pacing Threshold Pulse Width: 0.4 ms
Lead Channel Pacing Threshold Pulse Width: 0.4 ms
Lead Channel Sensing Intrinsic Amplitude: 12.375 mV
Lead Channel Sensing Intrinsic Amplitude: 12.375 mV
Lead Channel Sensing Intrinsic Amplitude: 2.125 mV
Lead Channel Sensing Intrinsic Amplitude: 2.125 mV
Lead Channel Setting Pacing Amplitude: 1.25 V
Lead Channel Setting Pacing Amplitude: 2 V
Lead Channel Setting Pacing Amplitude: 2.5 V
Lead Channel Setting Pacing Pulse Width: 0.4 ms
Lead Channel Setting Pacing Pulse Width: 0.4 ms
Lead Channel Setting Sensing Sensitivity: 0.9 mV

## 2019-04-02 NOTE — Progress Notes (Signed)
PPM remote 

## 2019-05-29 ENCOUNTER — Ambulatory Visit (INDEPENDENT_AMBULATORY_CARE_PROVIDER_SITE_OTHER): Payer: Medicare Other | Admitting: *Deleted

## 2019-05-29 DIAGNOSIS — Z950 Presence of cardiac pacemaker: Secondary | ICD-10-CM | POA: Diagnosis not present

## 2019-05-29 LAB — CUP PACEART REMOTE DEVICE CHECK
Battery Remaining Longevity: 83 mo
Battery Voltage: 3.02 V
Brady Statistic AP VP Percent: 60.92 %
Brady Statistic AP VS Percent: 0.02 %
Brady Statistic AS VP Percent: 39.04 %
Brady Statistic AS VS Percent: 0.01 %
Brady Statistic RA Percent Paced: 60.84 %
Brady Statistic RV Percent Paced: 99.8 %
Date Time Interrogation Session: 20210315182231
Implantable Lead Implant Date: 20110224
Implantable Lead Implant Date: 20110224
Implantable Lead Implant Date: 20110224
Implantable Lead Location: 753858
Implantable Lead Location: 753859
Implantable Lead Location: 753860
Implantable Lead Model: 4196
Implantable Lead Model: 5076
Implantable Lead Model: 6947
Implantable Pulse Generator Implant Date: 20170712
Lead Channel Impedance Value: 1026 Ohm
Lead Channel Impedance Value: 361 Ohm
Lead Channel Impedance Value: 475 Ohm
Lead Channel Impedance Value: 513 Ohm
Lead Channel Impedance Value: 532 Ohm
Lead Channel Impedance Value: 532 Ohm
Lead Channel Impedance Value: 608 Ohm
Lead Channel Impedance Value: 627 Ohm
Lead Channel Impedance Value: 703 Ohm
Lead Channel Pacing Threshold Amplitude: 0.625 V
Lead Channel Pacing Threshold Amplitude: 0.75 V
Lead Channel Pacing Threshold Amplitude: 1 V
Lead Channel Pacing Threshold Pulse Width: 0.4 ms
Lead Channel Pacing Threshold Pulse Width: 0.4 ms
Lead Channel Pacing Threshold Pulse Width: 0.4 ms
Lead Channel Sensing Intrinsic Amplitude: 1.125 mV
Lead Channel Sensing Intrinsic Amplitude: 1.125 mV
Lead Channel Sensing Intrinsic Amplitude: 10 mV
Lead Channel Sensing Intrinsic Amplitude: 10 mV
Lead Channel Setting Pacing Amplitude: 1.25 V
Lead Channel Setting Pacing Amplitude: 2 V
Lead Channel Setting Pacing Amplitude: 2.5 V
Lead Channel Setting Pacing Pulse Width: 0.4 ms
Lead Channel Setting Pacing Pulse Width: 0.4 ms
Lead Channel Setting Sensing Sensitivity: 0.9 mV

## 2019-05-30 NOTE — Progress Notes (Signed)
PPM Remote  

## 2019-06-26 ENCOUNTER — Encounter: Payer: Self-pay | Admitting: Cardiovascular Disease

## 2019-06-26 ENCOUNTER — Ambulatory Visit (INDEPENDENT_AMBULATORY_CARE_PROVIDER_SITE_OTHER): Payer: Medicare Other | Admitting: Cardiovascular Disease

## 2019-06-26 ENCOUNTER — Other Ambulatory Visit: Payer: Self-pay

## 2019-06-26 VITALS — BP 140/70 | HR 55 | Ht 74.0 in | Wt 206.0 lb

## 2019-06-26 DIAGNOSIS — I5022 Chronic systolic (congestive) heart failure: Secondary | ICD-10-CM | POA: Diagnosis not present

## 2019-06-26 DIAGNOSIS — I480 Paroxysmal atrial fibrillation: Secondary | ICD-10-CM | POA: Diagnosis not present

## 2019-06-26 DIAGNOSIS — I2510 Atherosclerotic heart disease of native coronary artery without angina pectoris: Secondary | ICD-10-CM | POA: Diagnosis not present

## 2019-06-26 DIAGNOSIS — Z79899 Other long term (current) drug therapy: Secondary | ICD-10-CM

## 2019-06-26 DIAGNOSIS — Z95810 Presence of automatic (implantable) cardiac defibrillator: Secondary | ICD-10-CM

## 2019-06-26 DIAGNOSIS — Z5181 Encounter for therapeutic drug level monitoring: Secondary | ICD-10-CM

## 2019-06-26 DIAGNOSIS — Z7901 Long term (current) use of anticoagulants: Secondary | ICD-10-CM

## 2019-06-26 DIAGNOSIS — I472 Ventricular tachycardia, unspecified: Secondary | ICD-10-CM

## 2019-06-26 LAB — COMPREHENSIVE METABOLIC PANEL
ALT: 51 IU/L — ABNORMAL HIGH (ref 0–44)
AST: 88 IU/L — ABNORMAL HIGH (ref 0–40)
Albumin/Globulin Ratio: 1.2 (ref 1.2–2.2)
Albumin: 3.6 g/dL — ABNORMAL LOW (ref 3.7–4.7)
Alkaline Phosphatase: 96 IU/L (ref 39–117)
BUN/Creatinine Ratio: 11 (ref 10–24)
BUN: 15 mg/dL (ref 8–27)
Bilirubin Total: 0.6 mg/dL (ref 0.0–1.2)
CO2: 24 mmol/L (ref 20–29)
Calcium: 9.1 mg/dL (ref 8.6–10.2)
Chloride: 105 mmol/L (ref 96–106)
Creatinine, Ser: 1.39 mg/dL — ABNORMAL HIGH (ref 0.76–1.27)
GFR calc Af Amer: 57 mL/min/{1.73_m2} — ABNORMAL LOW (ref >59)
GFR calc non Af Amer: 50 mL/min/{1.73_m2} — ABNORMAL LOW (ref >59)
Globulin, Total: 3 g/dL (ref 1.5–4.5)
Glucose: 77 mg/dL (ref 65–99)
Potassium: 4.6 mmol/L (ref 3.5–5.2)
Sodium: 144 mmol/L (ref 134–144)
Total Protein: 6.6 g/dL (ref 6.0–8.5)

## 2019-06-26 NOTE — Progress Notes (Signed)
Patient ID: Alan Orr, male   DOB: [birth year], 74 y.o.   MRN: [id number]    Cardiology Office Note    Date:  06/27/2019   ID:  Alan Orr, Alan Orr 10-11-1945, MRN [id number]  PCP:  [name], MD  Cardiologist:  Quay Burow, M.D. Sanda Klein, MD   Chief Complaint  Patient presents with  . Pacemaker Check    ICD    History of Present Illness:  Alan Orr is a 74 y.o. male with severe ischemic cardiomyopathy and previous remote bypass surgery, paroxysmal atrial fibrillation, previous DVT, hypertension, diabetes mellitus followed by Dr. Gwenlyn Found. He is here for follow-up on his CRT-D device.  The patient specifically denies any chest pain at rest exertion, dyspnea at rest or with exertion, orthopnea, paroxysmal nocturnal dyspnea, syncope, palpitations, focal neurological deficits, intermittent claudication, lower extremity edema, unexplained weight gain, cough, hemoptysis or wheezing.  Interrogation of his defibrillator (Medtronic Consulta dual-chamber, generator change in 2017) shows normal device function.  There is 99% biventricular pacing.   Battery longevity is 6.5 years.  Lead parameters are excellent.  He has never had tachycardia therapies delivered by the device.  He has approximately 61% atrial pacing.  Since 2019 he has not had either atrial fibrillation or nonsustained ventricular tachycardia.he is on chronic anticoagulation with Pradaxa and also takes amiodarone and metoprolol.  His OptiVol has been fairly volatile and has been out of range since March 18, but he is asymptomatic and does not have edema.  His most recent LV EF estimation was 42% on a January 2014 nuclear perfusion study, but 50-55 percent by transesophageal echo performed in October 2016 for bacteremia.    Past Medical History:  Diagnosis Date  . CAD (coronary artery disease)   . DM (diabetes mellitus) ([place])    type 2   . DVT (deep venous thrombosis) ([place])   . Hyperlipidemia   .  Hypertension   . ICD (implantable cardioverter-defibrillator), single, in situ   . Ischemic cardiomyopathy   . PAF (paroxysmal atrial fibrillation) ([place])   . RBBB   . S/P CABG x 5     Past Surgical History:  Procedure Laterality Date  . BACK SURGERY    . CARDIAC CATHETERIZATION  05/08/2009   small vessel disease  . CARDIAC DEFIBRILLATOR PLACEMENT  05/09/2009   Medtronic  . CORONARY ARTERY BYPASS GRAFT  12/31/1995   LIMA to LAD,SVG to intermediate,SVG to CX,seq. svg to posterior descending and posterolateral RCA  . EP IMPLANTABLE DEVICE N/A 09/25/2015   Procedure: PPM/BIV PPM Generator Changeout;  Surgeon: Evans Lance, MD;  Location: [facility];  Service: Cardiovascular;  Laterality: N/A;  . I & D EXTREMITY Left 11/03/2013   Procedure: Left Leg Debride Ulcer, Apply Wound VAC and Theraskin;  Surgeon: Newt Minion, MD;  Location: [facility];  Service: Orthopedics;  Laterality: Left;  . TEE WITHOUT CARDIOVERSION N/A 01/11/2015   Procedure: TRANSESOPHAGEAL ECHOCARDIOGRAM (TEE);  Surgeon: Herminio Commons, MD;  Location: [facility];  Service: Cardiology;  Laterality: N/A;    Current Medications: Outpatient Medications Prior to Visit  Medication Sig Dispense Refill  . ALPRAZolam (XANAX) 1 MG tablet Take 1 tablet (1 mg total) by mouth daily as needed for anxiety or sleep. 10 tablet 0  . amiodarone (PACERONE) 200 MG tablet Take 200 mg by mouth daily.    Marland Kitchen aspirin EC 81 MG tablet Take 81 mg by mouth at bedtime.    Marland Kitchen atorvastatin (LIPITOR) 40 MG tablet  Take 40 mg by mouth at bedtime.    . dabigatran (PRADAXA) 75 MG CAPS capsule Take 1 capsule (75 mg total) by mouth 2 (two) times daily. OV NEEDED 60 capsule 0  . doxycycline (VIBRAMYCIN) 100 MG capsule Take 1 capsule (100 mg total) by mouth 2 (two) times daily. 14 capsule 0  . ezetimibe (ZETIA) 10 MG tablet TAKE 1 TABLET ONCE DAILY FOR CHOLESTEROL. 30 tablet 11  . ferrous sulfate 325 (65 FE) MG tablet Take 325 mg by mouth daily with  breakfast.    . magnesium oxide (MAG-OX) 400 MG tablet Take 400 mg by mouth daily.    . metoprolol tartrate (LOPRESSOR) 50 MG tablet TAKE 1 TABLET BY MOUTH TWICE DAILY. 180 tablet 3  . Multiple Vitamin (MULTI-VITAMINS) TABS Take 1 tablet by mouth daily.    . niacin (NIASPAN) 1000 MG CR tablet TAKE (1) TABLET BY MOUTH AT BEDTIME. 90 tablet 3  . nitroGLYCERIN (NITROSTAT) 0.4 MG SL tablet Place 0.4 mg under the tongue every 5 (five) minutes x 3 doses as needed for chest pain.    Marland Kitchen oxyCODONE-acetaminophen (PERCOCET/ROXICET) 5-325 MG tablet Take 1 tablet by mouth every 4 (four) hours as needed for severe pain. 12 tablet 0  . ramipril (ALTACE) 2.5 MG capsule Take 2.5 mg by mouth at bedtime.     Marland Kitchen SYNTHROID 137 MCG tablet Take 137 mcg by mouth daily before breakfast.     . zolpidem (AMBIEN) 10 MG tablet Take 10 mg by mouth at bedtime.      No facility-administered medications prior to visit.     Allergies:   Patient has no known allergies.   Social History   Socioeconomic History  . Marital status: Legally Separated    Spouse name: Not on file  . Number of children: Not on file  . Years of education: Not on file  . Highest education level: Not on file  Occupational History  . Not on file  Tobacco Use  . Smoking status: Former Smoker    Packs/day: 1.50    Years: 35.00    Pack years: 52.50    Types: Cigarettes    Quit date: 11/03/1995    Years since quitting: 23.6  . Smokeless tobacco: Never Used  Substance and Sexual Activity  . Alcohol use: Yes    Comment: occasionally  . Drug use: No  . Sexual activity: Not on file  Other Topics Concern  . Not on file  Social History Narrative  . Not on file   Social Determinants of Health   Financial Resource Strain:   . Difficulty of Paying Living Expenses:   Food Insecurity:   . Worried About Charity fundraiser in the Last Year:   . Arboriculturist in the Last Year:   Transportation Needs:   . Film/video editor (Medical):   Marland Kitchen  Lack of Transportation (Non-Medical):   Physical Activity:   . Days of Exercise per Week:   . Minutes of Exercise per Session:   Stress:   . Feeling of Stress :   Social Connections:   . Frequency of Communication with Friends and Family:   . Frequency of Social Gatherings with Friends and Family:   . Attends Religious Services:   . Active Member of Clubs or Organizations:   . Attends Archivist Meetings:   Marland Kitchen Marital Status:      Family History:  The patient's family history includes Heart attack in his mother; Stroke in his  father.   ROS:   Please see the history of present illness.    ROS All other systems are reviewed and are negative.   PHYSICAL EXAM:   VS:  BP 140/70   Pulse (!) 55   Ht 6\' 2"  (1.88 m)   Wt 206 lb (93.4 kg)   BMI 26.45 kg/m     General: Alert, oriented x3, no distress, healthy ICD site Head: no evidence of trauma, PERRL, EOMI, no exophtalmos or lid lag, no myxedema, no xanthelasma; normal ears, nose and oropharynx Neck: normal jugular venous pulsations and no hepatojugular reflux; brisk carotid pulses without delay and no carotid bruits Chest: clear to auscultation, no signs of consolidation by percussion or palpation, normal fremitus, symmetrical and full respiratory excursions Cardiovascular: normal position and quality of the apical impulse, regular rhythm, normal first and second heart sounds, no murmurs, rubs or gallops Abdomen: no tenderness or distention, no masses by palpation, no abnormal pulsatility or arterial bruits, normal bowel sounds, no hepatosplenomegaly Extremities: no clubbing, cyanosis or edema; 2+ radial, ulnar and brachial pulses bilaterally; 2+ right femoral, posterior tibial and dorsalis pedis pulses; 2+ left femoral, posterior tibial and dorsalis pedis pulses; no subclavian or femoral bruits Neurological: grossly nonfocal Psych: Normal mood and affect   Wt Readings from Last 3 Encounters:  06/26/19 206 lb (93.4 kg)    01/13/19 204 lb (92.5 kg)  11/22/18 204 lb (92.5 kg)      Studies/Labs Reviewed:   EKG:  EKG is ordered today.  It shows atrial sensed, biventricular paced rhythm with distinct positive R wave in lead V1  Recent Labs: 06/26/2019: ALT 51; BUN 15; Creatinine, Ser 1.39; Potassium 4.6; Sodium 144  04/25/2019 hemoglobin 14.6, potassium 4.6, TSH 8.61 Lipid Panel    Component Value Date/Time   CHOL  05/07/2009 0826    128        ATP III CLASSIFICATION:  <200     mg/dL   Desirable  200-239  mg/dL   Borderline High  >=240    mg/dL   High          TRIG 46 05/07/2009 0826   HDL 43 05/07/2009 0826   CHOLHDL 3.0 05/07/2009 0826   VLDL 9 05/07/2009 0826   LDLCALC  05/07/2009 0826    76        Total Cholesterol/HDL:CHD Risk Coronary Heart Disease Risk Table                     Men   Women  1/2 Average Risk   3.4   3.3  Average Risk       5.0   4.4  2 X Average Risk   9.6   7.1  3 X Average Risk  23.4   11.0        Use the calculated Patient Ratio above and the CHD Risk Table to determine the patient's CHD Risk.        ATP III CLASSIFICATION (LDL):  <100     mg/dL   Optimal  100-129  mg/dL   Near or Above                    Optimal  130-159  mg/dL   Borderline  160-189  mg/dL   High  >190     mg/dL   Very High   04/25/2019 Total cholesterol 135, HDL 59, LDL 63, triglycerides 64  ASSESSMENT:    1. Chronic systolic heart failure ([place])  2. Ventricular tachycardia ([facility])   3. Paroxysmal atrial fibrillation ([place])   4. Coronary artery disease involving native coronary artery of native heart without angina pectoris   5. Biventricular ICD (implantable cardioverter-defibrillator) in place   6. Encounter for monitoring amiodarone therapy   7. Long term current use of anticoagulant      PLAN:  In order of problems listed above:  1. CHF: Although his OptiVol appears to be out of range, clinically he is euvolemic.  His EF has recovered substantially since device implantation.  He  is on appropriate ACE inhibitor/beta-blocker therapy and is not taking loop diuretics. 2. VT: Last detected by his device in 2019 3. PAF: Again, none has been recorded for at least the last couple of years.  He is still on anticoagulation which is appropriate especially since he also has a history of previous venous thromboembolic disease.  CHA2DS2-VASc 3 (age, CAD, CHF), but will increase to 4 when he turns 74 in just a few months. 4. CAD: Asymptomatic, over 20 years since his bypass procedure. Patent grafts by cardiac catheterization in 2011. Known anteroapical and septal scar without ischemia (nuclear study 2014).  All lipid parameters are in target range.  On both aspirin and Pradaxa, without bleeding complications. 5. CRT-D: Regular pacing efficiency and excellent clinical response.  Continue remote downloads every 3 months. 6. Amiodarone: Important to check liver and thyroid function tests every 6 months and have yearly ophthalmological exam, promptly report unexplained respiratory symptoms.  Checked liver tests today and his transaminases are mildly elevated (less than twice upper limit of normal), but similar to values from 2019 and much better than values from 2018.  TSH was mildly elevated and his levothyroxine dose was recently adjusted. 7. Anticoagulation: Well-tolerated without bleeding problems   Medication Adjustments/Labs and Tests Ordered: Current medicines are reviewed at length with the patient today.  Concerns regarding medicines are outlined above.  Medication changes, Labs and Tests ordered today are listed in the Patient Instructions below. Patient Instructions  Medication Instructions:  No changes *If you need a refill on your cardiac medications before your next appointment, please call your pharmacy*   Lab Work: Your provider would like for you to have the following labs today: CMET  If you have labs (blood work) drawn today and your tests are completely normal, you will  receive your results only by: Marland Kitchen MyChart Message (if you have MyChart) OR . A paper copy in the mail If you have any lab test that is abnormal or we need to change your treatment, we will call you to review the results.   Testing/Procedures: None ordered   Follow-Up: At [facility], you and your health needs are our priority.  As part of our continuing mission to provide you with exceptional heart care, we have created designated Provider Care Teams.  These Care Teams include your primary Cardiologist (physician) and Advanced Practice Providers (APPs -  Physician Assistants and Nurse Practitioners) who all work together to provide you with the care you need, when you need it.  We recommend signing up for the patient portal called "MyChart".  Sign up information is provided on this After Visit Summary.  MyChart is used to connect with patients for Virtual Visits (Telemedicine).  Patients are able to view lab/test results, encounter notes, upcoming appointments, etc.  Non-urgent messages can be sent to your provider as well.   To learn more about what you can do with MyChart, go to [email or web address].    Your  next appointment:   12 month(s)  The format for your next appointment:   In Person  Provider:   Sanda Klein, MD     Signed, Sanda Klein, MD  06/27/2019 5:38 PM    [address], [place], [place]  [address] Phone: [phone]; Fax: [phone]

## 2019-06-26 NOTE — Patient Instructions (Signed)
Medication Instructions:  No changes *If you need a refill on your cardiac medications before your next appointment, please call your pharmacy*   Lab Work: Your provider would like for you to have the following labs today: CMET  If you have labs (blood work) drawn today and your tests are completely normal, you will receive your results only by: Marland Kitchen MyChart Message (if you have MyChart) OR . A paper copy in the mail If you have any lab test that is abnormal or we need to change your treatment, we will call you to review the results.   Testing/Procedures: None ordered   Follow-Up: At [facility], you and your health needs are our priority.  As part of our continuing mission to provide you with exceptional heart care, we have created designated Provider Care Teams.  These Care Teams include your primary Cardiologist (physician) and Advanced Practice Providers (APPs -  Physician Assistants and Nurse Practitioners) who all work together to provide you with the care you need, when you need it.  We recommend signing up for the patient portal called "MyChart".  Sign up information is provided on this After Visit Summary.  MyChart is used to connect with patients for Virtual Visits (Telemedicine).  Patients are able to view lab/test results, encounter notes, upcoming appointments, etc.  Non-urgent messages can be sent to your provider as well.   To learn more about what you can do with MyChart, go to [email or web address].    Your next appointment:   12 month(s)  The format for your next appointment:   In Person  Provider:   Sanda Klein, MD

## 2019-06-27 ENCOUNTER — Encounter: Payer: Self-pay | Admitting: Cardiovascular Disease

## 2019-06-27 ENCOUNTER — Encounter: Payer: Self-pay | Admitting: *Deleted

## 2019-06-28 ENCOUNTER — Telehealth: Payer: Self-pay

## 2019-06-28 NOTE — Telephone Encounter (Signed)
Spoke to patient I received a message from Dr.Berry to schedule you appointment in 2 to 3 months.Appointment scheduled with Dr.Berry 08/29/19 at 2:00 pm

## 2019-07-24 ENCOUNTER — Other Ambulatory Visit: Payer: Self-pay | Admitting: Internal Medicine

## 2019-08-15 ENCOUNTER — Emergency Department (HOSPITAL_COMMUNITY): Payer: Medicare Other

## 2019-08-15 ENCOUNTER — Other Ambulatory Visit: Payer: Self-pay

## 2019-08-15 ENCOUNTER — Encounter (HOSPITAL_COMMUNITY): Payer: Self-pay | Admitting: Emergency Medicine

## 2019-08-15 ENCOUNTER — Observation Stay (HOSPITAL_BASED_OUTPATIENT_CLINIC_OR_DEPARTMENT_OTHER)
Admission: EM | Admit: 2019-08-15 | Discharge: 2019-08-16 | Disposition: A | Discharge status: Home / Self Care | Payer: Medicare Other | Source: Home / Self Care | Attending: Emergency Medicine | Admitting: Emergency Medicine

## 2019-08-15 ENCOUNTER — Emergency Department (HOSPITAL_COMMUNITY)
Admission: EM | Admit: 2019-08-15 | Discharge: 2019-08-15 | Disposition: A | Discharge status: Home / Self Care | Payer: Medicare Other | Source: Home / Self Care | Attending: Emergency Medicine | Admitting: Emergency Medicine

## 2019-08-15 DIAGNOSIS — E119 Type 2 diabetes mellitus without complications: Secondary | ICD-10-CM | POA: Insufficient documentation

## 2019-08-15 DIAGNOSIS — N179 Acute kidney failure, unspecified: Secondary | ICD-10-CM | POA: Diagnosis not present

## 2019-08-15 DIAGNOSIS — E860 Dehydration: Secondary | ICD-10-CM

## 2019-08-15 DIAGNOSIS — I10 Essential (primary) hypertension: Secondary | ICD-10-CM | POA: Insufficient documentation

## 2019-08-15 DIAGNOSIS — Y9389 Activity, other specified: Secondary | ICD-10-CM | POA: Insufficient documentation

## 2019-08-15 DIAGNOSIS — Y999 Unspecified external cause status: Secondary | ICD-10-CM | POA: Insufficient documentation

## 2019-08-15 DIAGNOSIS — R296 Repeated falls: Secondary | ICD-10-CM | POA: Diagnosis not present

## 2019-08-15 DIAGNOSIS — Z7902 Long term (current) use of antithrombotics/antiplatelets: Secondary | ICD-10-CM | POA: Insufficient documentation

## 2019-08-15 DIAGNOSIS — I2510 Atherosclerotic heart disease of native coronary artery without angina pectoris: Secondary | ICD-10-CM | POA: Insufficient documentation

## 2019-08-15 DIAGNOSIS — R531 Weakness: Secondary | ICD-10-CM | POA: Diagnosis not present

## 2019-08-15 DIAGNOSIS — Z20822 Contact with and (suspected) exposure to covid-19: Secondary | ICD-10-CM | POA: Insufficient documentation

## 2019-08-15 DIAGNOSIS — W01198A Fall on same level from slipping, tripping and stumbling with subsequent striking against other object, initial encounter: Secondary | ICD-10-CM | POA: Insufficient documentation

## 2019-08-15 DIAGNOSIS — Y929 Unspecified place or not applicable: Secondary | ICD-10-CM | POA: Insufficient documentation

## 2019-08-15 DIAGNOSIS — W19XXXA Unspecified fall, initial encounter: Secondary | ICD-10-CM

## 2019-08-15 DIAGNOSIS — S42211A Unspecified displaced fracture of surgical neck of right humerus, initial encounter for closed fracture: Secondary | ICD-10-CM | POA: Insufficient documentation

## 2019-08-15 DIAGNOSIS — I480 Paroxysmal atrial fibrillation: Secondary | ICD-10-CM | POA: Insufficient documentation

## 2019-08-15 DIAGNOSIS — S42291A Other displaced fracture of upper end of right humerus, initial encounter for closed fracture: Secondary | ICD-10-CM

## 2019-08-15 DIAGNOSIS — Z950 Presence of cardiac pacemaker: Secondary | ICD-10-CM | POA: Insufficient documentation

## 2019-08-15 DIAGNOSIS — Z87891 Personal history of nicotine dependence: Secondary | ICD-10-CM | POA: Insufficient documentation

## 2019-08-15 LAB — COMPREHENSIVE METABOLIC PANEL
ALT: 66 U/L — ABNORMAL HIGH (ref 0–44)
AST: 64 U/L — ABNORMAL HIGH (ref 15–41)
Albumin: 2.8 g/dL — ABNORMAL LOW (ref 3.5–5.0)
Alkaline Phosphatase: 66 U/L (ref 38–126)
Anion gap: 9 (ref 5–15)
BUN: 25 mg/dL — ABNORMAL HIGH (ref 8–23)
CO2: 24 mmol/L (ref 22–32)
Calcium: 8.2 mg/dL — ABNORMAL LOW (ref 8.9–10.3)
Chloride: 104 mmol/L (ref 98–111)
Creatinine, Ser: 1.84 mg/dL — ABNORMAL HIGH (ref 0.61–1.24)
GFR calc Af Amer: 41 mL/min — ABNORMAL LOW (ref >60)
GFR calc non Af Amer: 35 mL/min — ABNORMAL LOW (ref >60)
Glucose, Bld: 168 mg/dL — ABNORMAL HIGH (ref 70–99)
Potassium: 3.9 mmol/L (ref 3.5–5.1)
Sodium: 137 mmol/L (ref 135–145)
Total Bilirubin: 1 mg/dL (ref 0.3–1.2)
Total Protein: 6.1 g/dL — ABNORMAL LOW (ref 6.5–8.1)

## 2019-08-15 LAB — CBC WITH DIFFERENTIAL/PLATELET
Abs Immature Granulocytes: 0.05 10*3/uL (ref 0.00–0.07)
Basophils Absolute: 0 10*3/uL (ref 0.0–0.1)
Basophils Relative: 0 %
Eosinophils Absolute: 0 10*3/uL (ref 0.0–0.5)
Eosinophils Relative: 0 %
HCT: 37.6 % — ABNORMAL LOW (ref 39.0–52.0)
Hemoglobin: 12.2 g/dL — ABNORMAL LOW (ref 13.0–17.0)
Immature Granulocytes: 0 %
Lymphocytes Relative: 18 %
Lymphs Abs: 2.1 10*3/uL (ref 0.7–4.0)
MCH: 35 pg — ABNORMAL HIGH (ref 26.0–34.0)
MCHC: 32.4 g/dL (ref 30.0–36.0)
MCV: 107.7 fL — ABNORMAL HIGH (ref 80.0–100.0)
Monocytes Absolute: 1.1 10*3/uL — ABNORMAL HIGH (ref 0.1–1.0)
Monocytes Relative: 9 %
Neutro Abs: 8.3 10*3/uL — ABNORMAL HIGH (ref 1.7–7.7)
Neutrophils Relative %: 73 %
Platelets: 220 10*3/uL (ref 150–400)
RBC: 3.49 MIL/uL — ABNORMAL LOW (ref 4.22–5.81)
RDW: 14.1 % (ref 11.5–15.5)
WBC: 11.5 10*3/uL — ABNORMAL HIGH (ref 4.0–10.5)
nRBC: 0 % (ref 0.0–0.2)

## 2019-08-15 LAB — SARS CORONAVIRUS 2 BY RT PCR (HOSPITAL ORDER, PERFORMED IN ~~LOC~~ HOSPITAL LAB): SARS Coronavirus 2: NEGATIVE

## 2019-08-15 LAB — ETHANOL: Alcohol, Ethyl (B): <10 mg/dL (ref <10)

## 2019-08-15 LAB — TROPONIN I (HIGH SENSITIVITY)
Troponin I (High Sensitivity): 13 ng/L (ref <18)
Troponin I (High Sensitivity): 13 ng/L (ref <18)

## 2019-08-15 MED ORDER — ACETAMINOPHEN 650 MG RE SUPP: 650.0000 mg | Freq: Four times a day (QID) | RECTAL | Status: DC | PRN

## 2019-08-15 MED ORDER — EZETIMIBE 10 MG PO TABS
10.0000 mg | ORAL_TABLET | Freq: Every day | ORAL | Status: DC
  Administered 2019-08-15 – 2019-08-16 (×2): 10 mg via ORAL
  Filled 2019-08-15 (×2): qty 1

## 2019-08-15 MED ORDER — FENTANYL CITRATE (PF) 100 MCG/2ML IJ SOLN: 50.0000 ug | Freq: Once | INTRAMUSCULAR | Status: AC

## 2019-08-15 MED ORDER — ATORVASTATIN CALCIUM 40 MG PO TABS
40.0000 mg | ORAL_TABLET | Freq: Every day | ORAL | Status: DC
  Administered 2019-08-15: 40 mg via ORAL
  Filled 2019-08-15: qty 1

## 2019-08-15 MED ORDER — NIACIN ER (ANTIHYPERLIPIDEMIC) 500 MG PO TBCR
1000.0000 mg | EXTENDED_RELEASE_TABLET | Freq: Every day | ORAL | Status: DC
  Administered 2019-08-15: 1000 mg via ORAL
  Filled 2019-08-15 (×3): qty 2
  Filled 2019-08-15: qty 4

## 2019-08-15 MED ORDER — FENTANYL CITRATE (PF) 100 MCG/2ML IJ SOLN
INTRAMUSCULAR | Status: AC
  Administered 2019-08-15: 50 ug via INTRAMUSCULAR
  Filled 2019-08-15: qty 2

## 2019-08-15 MED ORDER — SODIUM CHLORIDE 0.9 % IV BOLUS
500.0000 mL | Freq: Once | INTRAVENOUS | Status: AC
  Administered 2019-08-15: 500 mL via INTRAVENOUS

## 2019-08-15 MED ORDER — ONDANSETRON HCL 4 MG PO TABS: 4.0000 mg | ORAL_TABLET | Freq: Four times a day (QID) | ORAL | Status: DC | PRN

## 2019-08-15 MED ORDER — ALPRAZOLAM 1 MG PO TABS: 1.0000 mg | ORAL_TABLET | Freq: Four times a day (QID) | ORAL | Status: DC | PRN

## 2019-08-15 MED ORDER — FERROUS SULFATE 325 (65 FE) MG PO TABS
325.0000 mg | ORAL_TABLET | Freq: Every day | ORAL | Status: DC
  Administered 2019-08-16: 325 mg via ORAL
  Filled 2019-08-15: qty 1

## 2019-08-15 MED ORDER — ACETAMINOPHEN 325 MG PO TABS: 650.0000 mg | ORAL_TABLET | Freq: Four times a day (QID) | ORAL | Status: DC | PRN

## 2019-08-15 MED ORDER — ASPIRIN EC 81 MG PO TBEC
81.0000 mg | DELAYED_RELEASE_TABLET | Freq: Every day | ORAL | Status: DC
  Administered 2019-08-15: 81 mg via ORAL
  Filled 2019-08-15: qty 1

## 2019-08-15 MED ORDER — SODIUM CHLORIDE 0.9 % IV SOLN: INTRAVENOUS | Status: DC

## 2019-08-15 MED ORDER — ONDANSETRON HCL 4 MG/2ML IJ SOLN: 4.0000 mg | Freq: Four times a day (QID) | INTRAMUSCULAR | Status: DC | PRN

## 2019-08-15 MED ORDER — SODIUM CHLORIDE 0.9 % IV SOLN: Freq: Once | INTRAVENOUS | Status: AC

## 2019-08-15 MED ORDER — DABIGATRAN ETEXILATE MESYLATE 75 MG PO CAPS
75.0000 mg | ORAL_CAPSULE | Freq: Two times a day (BID) | ORAL | Status: DC
  Administered 2019-08-15 – 2019-08-16 (×2): 75 mg via ORAL
  Filled 2019-08-15 (×2): qty 1

## 2019-08-15 MED ORDER — METOPROLOL TARTRATE 50 MG PO TABS
50.0000 mg | ORAL_TABLET | Freq: Two times a day (BID) | ORAL | Status: DC
  Administered 2019-08-15 – 2019-08-16 (×2): 50 mg via ORAL
  Filled 2019-08-15 (×2): qty 1

## 2019-08-15 MED ORDER — AMIODARONE HCL 200 MG PO TABS
200.0000 mg | ORAL_TABLET | Freq: Every day | ORAL | Status: DC
  Administered 2019-08-15 – 2019-08-16 (×2): 200 mg via ORAL
  Filled 2019-08-15 (×2): qty 1

## 2019-08-15 MED ORDER — OXYCODONE HCL 5 MG PO TABS
20.0000 mg | ORAL_TABLET | Freq: Four times a day (QID) | ORAL | Status: DC | PRN
  Administered 2019-08-15: 20 mg via ORAL
  Filled 2019-08-15: qty 4

## 2019-08-15 MED ORDER — LEVOTHYROXINE SODIUM 137 MCG PO TABS
137.0000 ug | ORAL_TABLET | Freq: Every day | ORAL | Status: DC
  Administered 2019-08-16: 137 ug via ORAL
  Filled 2019-08-15: qty 1

## 2019-08-15 NOTE — ED Triage Notes (Signed)
PT reports he has had generalized weakness and dizziness with movement that started yesterday afternoon and then he fell in the parking lot of his apartment and fractured his right humerus and was discharged from the ED last night. PT states today he slid off the couch and was unable to get out of the floor and his apartment manager called EMS.

## 2019-08-15 NOTE — ED Provider Notes (Signed)
Emergency Department Provider Note   I have reviewed the triage vital signs and the nursing notes.   HISTORY  Chief Complaint Weakness   HPI Alan Orr is a 74 y.o. male with PMH of CAD, DM, HLD, and HTN returns to the emergency department for evaluation after slipping out of his chair at home.  The patient was seen in the emergency department last night after he fell while getting into his truck.  He sustained a proximal humerus fracture on the right and was placed in a shoulder immobilizer.  He lives alone but at the time of discharge thought that he was getting around well and did not need assistance at home.  He tells me that he did drink some alcohol yesterday.  He denies any new medications.  He states that overall he is feeling pretty well but today was sitting in his chair which he states is old and slipped out of the chair.  He fell to the ground and could not get back up with the arm injury from last night. Denies any numbness or speech change. No fever. No dysuria, hesitancy, or urgency. He denies having any family in the area.   Past Medical History:  Diagnosis Date  . CAD (coronary artery disease)   . DM (diabetes mellitus) ([place])    type 2   . DVT (deep venous thrombosis) ([place])   . Hyperlipidemia   . Hypertension   . ICD (implantable cardioverter-defibrillator), single, in situ   . Ischemic cardiomyopathy   . PAF (paroxysmal atrial fibrillation) ([place])   . RBBB   . S/P CABG x 5     Patient Active Problem List   Diagnosis Date Noted  . AKI (acute kidney injury) ([place]) 08/15/2019  . Cellulitis of right lower extremity 06/21/2018  . Salmonella sepsis ([place]) 10/21/2016  . Salmonella gastroenteritis 10/20/2016  . Transaminasemia 10/18/2016  . Pancytopenia ([place]) 10/18/2016  . DM type 2 (diabetes mellitus, type 2) ([place]) 10/18/2016  . Thrombocytopenia ([place]) 08/22/2016  . Tick bite of right thigh 08/22/2016  . Abnormal serum protein electrophoresis 08/21/2014  .  Leg wound, left 11/03/2013  . History of carotid artery disease 03/03/2013  . Cardiomyopathy, ischemic 11/08/2012  . CAD s/p CABG  11/08/2012  . Hyperlipidemia 11/08/2012  . Paroxysmal atrial fibrillation ([place]) 11/08/2012  . Ventricular tachycardia ([place]) 11/08/2012  . Type 2 diabetes mellitus with stage 3 chronic kidney disease ([place]) 08/20/2009  . PACEMAKER, PERMANENT 08/20/2009  . Biventricular ICD (implantable cardioverter-defibrillator) in place 08/20/2009    Past Surgical History:  Procedure Laterality Date  . BACK SURGERY    . CARDIAC CATHETERIZATION  05/08/2009   small vessel disease  . CARDIAC DEFIBRILLATOR PLACEMENT  05/09/2009   [employer]  . CORONARY ARTERY BYPASS GRAFT  12/31/1995   LIMA to LAD,SVG to intermediate,SVG to CX,seq. svg to posterior descending and posterolateral RCA  . EP IMPLANTABLE DEVICE N/A 09/25/2015   Procedure: PPM/BIV PPM Generator Changeout;  Surgeon: Evans Lance, MD;  Location: [facility];  Service: Cardiovascular;  Laterality: N/A;  . I & D EXTREMITY Left 11/03/2013   Procedure: Left Leg Debride Ulcer, Apply Wound VAC and [place];  Surgeon: Newt Minion, MD;  Location: [facility];  Service: Orthopedics;  Laterality: Left;  . TEE WITHOUT CARDIOVERSION N/A 01/11/2015   Procedure: TRANSESOPHAGEAL ECHOCARDIOGRAM (TEE);  Surgeon: Herminio Commons, MD;  Location: [facility];  Service: Cardiology;  Laterality: N/A;    Allergies Patient has no known allergies.  Family History  Problem Relation Age of Onset  . Heart attack Mother   . Stroke Father     Social History Social History   Tobacco Use  . Smoking status: Former Smoker    Packs/day: 1.50    Years: 35.00    Pack years: 52.50    Types: Cigarettes    Quit date: 11/03/1995    Years since quitting: 23.8  . Smokeless tobacco: Never Used  Substance Use Topics  . Alcohol use: Yes    Comment: occasionally  . Drug use: No    Review of Systems  Constitutional: No  fever/chills Eyes: No visual changes. ENT: No sore throat. Cardiovascular: Denies chest pain. Respiratory: Denies shortness of breath. Gastrointestinal: No abdominal pain.  No nausea, no vomiting.  No diarrhea.  No constipation. Genitourinary: Negative for dysuria. Musculoskeletal: Negative for back pain. Positive right shoulder pain.  Skin: Negative for rash. Neurological: Negative for headaches, focal weakness or numbness.  10-point ROS otherwise negative.  ____________________________________________   PHYSICAL EXAM:  VITAL SIGNS: ED Triage Vitals  Enc Vitals Group     BP 08/15/19 1553 (!) 96/55     Pulse Rate 08/15/19 1553 66     Resp 08/15/19 1553 19     Temp 08/15/19 1553 98.4 F (36.9 C)     Temp Source 08/15/19 1553 Oral     SpO2 08/15/19 1553 99 %     Weight 08/15/19 1551 215 lb 13.3 oz (97.9 kg)     Height 08/15/19 1551 6\' 2"  (1.88 m)   Constitutional: Alert and oriented. Somewhat HOH but able to provide a full history.  Eyes: Conjunctivae are normal. PERRL.  Head: Atraumatic. Nose: No congestion/rhinnorhea. Mouth/Throat: Mucous membranes are moist. Neck: No stridor. No cervical spine tenderness to palpation. Cardiovascular: Normal rate, regular rhythm. Good peripheral circulation. Grossly normal heart sounds.   Respiratory: Normal respiratory effort.  No retractions. Lungs CTAB. Gastrointestinal: Soft and nontender. No distention.  Musculoskeletal: No lower extremity tenderness with trace bilateral LE edema. No gross deformities of extremities. Neurologic:  Normal speech and language. No gross focal neurologic deficits are appreciated.  Skin:  Skin is warm, dry and intact. No rash noted.  ____________________________________________   LABS (all labs ordered are listed, but only abnormal results are displayed)  Labs Reviewed  CBC WITH DIFFERENTIAL/PLATELET - Abnormal; Notable for the following components:      Result Value   WBC 11.5 (*)    RBC 3.49 (*)     Hemoglobin 12.2 (*)    HCT 37.6 (*)    MCV 107.7 (*)    MCH 35.0 (*)    Neutro Abs 8.3 (*)    Monocytes Absolute 1.1 (*)    All other components within normal limits  COMPREHENSIVE METABOLIC PANEL - Abnormal; Notable for the following components:   Glucose, Bld 168 (*)    BUN 25 (*)    Creatinine, Ser 1.84 (*)    Calcium 8.2 (*)    Total Protein 6.1 (*)    Albumin 2.8 (*)    AST 64 (*)    ALT 66 (*)    GFR calc non Af Amer 35 (*)    GFR calc Af Amer 41 (*)    All other components within normal limits  CBC - Abnormal; Notable for the following components:   WBC 10.8 (*)    RBC 3.18 (*)    Hemoglobin 11.2 (*)    HCT 34.3 (*)    MCV 107.9 (*)  MCH 35.2 (*)    All other components within normal limits  COMPREHENSIVE METABOLIC PANEL - Abnormal; Notable for the following components:   Glucose, Bld 143 (*)    BUN 27 (*)    Creatinine, Ser 1.71 (*)    Calcium 8.2 (*)    Total Protein 6.0 (*)    Albumin 2.7 (*)    AST 51 (*)    ALT 59 (*)    GFR calc non Af Amer 39 (*)    GFR calc Af Amer 45 (*)    All other components within normal limits  SARS CORONAVIRUS 2 BY RT PCR (HOSPITAL ORDER, [facility])  ETHANOL  URINALYSIS, ROUTINE W REFLEX MICROSCOPIC  TROPONIN I (HIGH SENSITIVITY)  TROPONIN I (HIGH SENSITIVITY)   ____________________________________________  EKG   EKG Interpretation  Date/Time:  Tuesday August 15 2019 15:51:04 EDT Ventricular Rate:  67 PR Interval:    QRS Duration: 134 QT Interval:  481 QTC Calculation: 508 R Axis:   139 Text Interpretation: V paced rhythm. Confirmed by Nanda Quinton [phone]) on 08/15/2019 4:10:48 PM       ____________________________________________  RADIOLOGY  CT Head Wo Contrast  Result Date: 08/15/2019 CLINICAL DATA:  Fall EXAM: CT HEAD WITHOUT CONTRAST TECHNIQUE: Contiguous axial images were obtained from the base of the skull through the vertex without intravenous contrast. COMPARISON:  None.  FINDINGS: Brain: There is atrophy and chronic small vessel disease changes. No acute intracranial abnormality. Specifically, no hemorrhage, hydrocephalus, mass lesion, acute infarction, or significant intracranial injury. Vascular: No hyperdense vessel or unexpected calcification. Skull: No acute calvarial abnormality. Sinuses/Orbits: Visualized paranasal sinuses and mastoids clear. Orbital soft tissues unremarkable. Other: None IMPRESSION: Atrophy, chronic microvascular disease. No acute intracranial abnormality. Electronically Signed   By: Rolm Baptise M.D.   On: 08/15/2019 17:30   CT Cervical Spine Wo Contrast  Result Date: 08/15/2019 CLINICAL DATA:  Fall EXAM: CT CERVICAL SPINE WITHOUT CONTRAST TECHNIQUE: Multidetector CT imaging of the cervical spine was performed without intravenous contrast. Multiplanar CT image reconstructions were also generated. COMPARISON:  None. FINDINGS: Alignment: Normal Skull base and vertebrae: No acute fracture. No primary bone lesion or focal pathologic process. Soft tissues and spinal canal: No prevertebral fluid or swelling. No visible canal hematoma. Disc levels:  Diffuse degenerative disc disease and facet disease. Upper chest: Old healed right rib fractures.  No acute findings. Other: Carotid artery calcifications. IMPRESSION: Degenerative disc and facet disease.  No acute bony abnormality. Electronically Signed   By: Rolm Baptise M.D.   On: 08/15/2019 17:32    ____________________________________________   PROCEDURES  Procedure(s) performed:   Procedures  None ____________________________________________   INITIAL IMPRESSION / ASSESSMENT AND PLAN / ED COURSE  Pertinent labs & imaging results that were available during my care of the patient were reviewed by me and considered in my medical decision making (see chart for details).   Patient presents emergency department for evaluation after slipping and falling out of his chair at home.  He was unable to  get off the ground due to an arm injury he sustained during another fall yesterday evening.  He has no focal neuro deficits.  The shoulder immobilizer is in place.  Initial blood pressure is 96/55 with normal heart rate and no fever. Plan for CT head, c-spine with multiple recent falls. No focal neuro deficits. Will obtain labs with borderline low BP here.   07:01 PM  His lab work reviewed showing elevated BUN and creatinine.  Albumin is low.  LFTs have been slightly elevated in the past which is unchanged here.  Patient's GFR is now 35.  In early April he was greater than 60.  Question if the patient has had some dehydration and low blood pressures from poor p.o. intake.  His initial pressures here were somewhat soft and improved with IV fluids.  Plan for additional IV fluid hydration and admission for acute kidney injury.   Discussed patient's case with [facility] to request admission. Patient and family (if present) updated with plan. Care transferred to [facility] service.  I reviewed all nursing notes, vitals, pertinent old records, EKGs, labs, imaging (as available).  ____________________________________________  FINAL CLINICAL IMPRESSION(S) / ED DIAGNOSES  Final diagnoses:  Dehydration  Fall, initial encounter     MEDICATIONS GIVEN DURING THIS VISIT:  Medications  aspirin EC tablet 81 mg (81 mg Oral Given 08/15/19 2225)  oxyCODONE (Oxy IR/ROXICODONE) immediate release tablet 20 mg (20 mg Oral Given 08/15/19 2231)  amiodarone (PACERONE) tablet 200 mg (200 mg Oral Given 08/15/19 2223)  atorvastatin (LIPITOR) tablet 40 mg (40 mg Oral Given 08/15/19 2224)  ezetimibe (ZETIA) tablet 10 mg (10 mg Oral Given 08/15/19 2226)  metoprolol tartrate (LOPRESSOR) tablet 50 mg (50 mg Oral Given 08/15/19 2223)  niacin (NIASPAN) CR tablet 1,000 mg (1,000 mg Oral Given 08/15/19 2309)  ALPRAZolam (XANAX) tablet 1 mg (has no administration in time range)  levothyroxine (SYNTHROID) tablet 137 mcg (137 mcg Oral Given 08/16/19 0531)    ferrous sulfate tablet 325 mg (325 mg Oral Given 08/16/19 0757)  0.9 %  sodium chloride infusion ( Intravenous New Bag/Given 08/16/19 0755)  acetaminophen (TYLENOL) tablet 650 mg (has no administration in time range)    Or  acetaminophen (TYLENOL) suppository 650 mg (has no administration in time range)  ondansetron (ZOFRAN) tablet 4 mg (has no administration in time range)    Or  ondansetron (ZOFRAN) injection 4 mg (has no administration in time range)  dabigatran (PRADAXA) capsule 75 mg (75 mg Oral Given 08/15/19 2223)  sodium chloride 0.9 % bolus 500 mL (0 mLs Intravenous Stopped 08/15/19 1935)  0.9 %  sodium chloride infusion ( Intravenous New Bag/Given (Non-Interop) 08/15/19 1935)    Note:  This document was prepared using Systems analyst and may include unintentional dictation errors.  Nanda Quinton, MD, [facility] Emergency Medicine    Keshonda Monsour, [facility], MD 08/16/19 [phone]

## 2019-08-15 NOTE — H&P (Signed)
TRH H&P    Patient Demographics:    Wrigley Lema, is a 74 y.o. male  MRN: [id number]  DOB - Nov 13, 1945  Admit Date - 08/15/2019  Referring MD/NP/PA: Nanda Quinton  Outpatient Primary MD for the patient is [name], MD  Patient coming from: Home  Chief complaint-generalized weakness   HPI:    Lavontay Mish  is a 74 y.o. male, with medical history of CAD, ischemic cardiomyopathy,  remote bypass surgery, paroxysmal atrial fibrillation on Pradaxa, DVT, hypertension, history of V. tach status post biventricular ICD, hypothyroidism, hypertension, hyperlipidemia who came to the ED with generalized weakness.  Patient was seen in the ED last night after he fell while getting into his truck.  He sustained a proximal humerus fracture on the right and was placed in a shoulder immobilizer.  Patient was discharged home.  Patient did drink alcohol yesterday.  Denies taking any medications.  Patient today was feeling generally weak and was sitting in his chair he slipped out of the chair.  He fell on the ground could not get back up with arm injury.  There has been no change in neurological status, no numbness or change in speech.  No focal weakness of extremities except right upper arm in sling.  No fever or dysuria.  No chest pain or shortness of breath. In the ED patient was found to be in mild DKA and started on IV fluids.    Review of systems:    In addition to the HPI above,   All other systems reviewed and are negative.    Past History of the following :    Past Medical History:  Diagnosis Date  . CAD (coronary artery disease)   . DM (diabetes mellitus) ([place])    type 2   . DVT (deep venous thrombosis) ([place])   . Hyperlipidemia   . Hypertension   . ICD (implantable cardioverter-defibrillator), single, in situ   . Ischemic cardiomyopathy   . PAF (paroxysmal atrial fibrillation) ([place])   . RBBB   . S/P  CABG x 5       Past Surgical History:  Procedure Laterality Date  . BACK SURGERY    . CARDIAC CATHETERIZATION  05/08/2009   small vessel disease  . CARDIAC DEFIBRILLATOR PLACEMENT  05/09/2009   [employer]  . CORONARY ARTERY BYPASS GRAFT  12/31/1995   LIMA to LAD,SVG to intermediate,SVG to CX,seq. svg to posterior descending and posterolateral RCA  . EP IMPLANTABLE DEVICE N/A 09/25/2015   Procedure: PPM/BIV PPM Generator Changeout;  Surgeon: Evans Lance, MD;  Location: [facility];  Service: Cardiovascular;  Laterality: N/A;  . I & D EXTREMITY Left 11/03/2013   Procedure: Left Leg Debride Ulcer, Apply Wound VAC and [place];  Surgeon: Newt Minion, MD;  Location: [facility];  Service: Orthopedics;  Laterality: Left;  . TEE WITHOUT CARDIOVERSION N/A 01/11/2015   Procedure: TRANSESOPHAGEAL ECHOCARDIOGRAM (TEE);  Surgeon: Herminio Commons, MD;  [facility];  Service: Cardiology;  Laterality: N/A;      Social History:  Social History   Tobacco Use  . Smoking status: Former Smoker    Packs/day: 1.50    Years: 35.00    Pack years: 52.50    Types: Cigarettes    Quit date: 11/03/1995    Years since quitting: 23.7  . Smokeless tobacco: Never Used  Substance Use Topics  . Alcohol use: Yes    Comment: occasionally       Family History :     Family History  Problem Relation Age of Onset  . Heart attack Mother   . Stroke Father       Home Medications:   Prior to Admission medications   Medication Sig Start Date End Date Taking? Authorizing Provider  ALPRAZolam Duanne Moron) 1 MG tablet Take 1 tablet (1 mg total) by mouth daily as needed for anxiety or sleep. Patient taking differently: Take 1 mg by mouth 4 (four) times daily as needed for anxiety or sleep.  10/22/16  Yes Kathie Dike, MD  amiodarone (PACERONE) 200 MG tablet Take 200 mg by mouth daily.   Yes [provider]  atorvastatin (LIPITOR) 40 MG tablet Take 40 mg by mouth at bedtime.   Yes  [provider]  ezetimibe (ZETIA) 10 MG tablet TAKE 1 TABLET ONCE DAILY FOR CHOLESTEROL. Patient taking differently: Take 10 mg by mouth daily.  01/04/17  Yes Lorretta Harp, MD  metoprolol tartrate (LOPRESSOR) 50 MG tablet TAKE 1 TABLET BY MOUTH TWICE DAILY. Patient taking differently: Take 50 mg by mouth 2 (two) times daily.  10/10/18  Yes Evans Lance, MD  niacin (NIASPAN) 1000 MG CR tablet TAKE (1) TABLET BY MOUTH AT BEDTIME. Patient taking differently: Take 1,000 mg by mouth at bedtime.  10/15/16  Yes Bayard Hugger, MD  nitroGLYCERIN (NITROSTAT) 0.4 MG SL tablet Place 0.4 mg under the tongue every 5 (five) minutes x 3 doses as needed for chest pain. 10/31/15  Yes [provider]  Oxycodone HCl 20 MG TABS Take 1 tablet by mouth 4 (four) times daily as needed (for pain).  07/23/19  Yes [provider]  ramipril (ALTACE) 2.5 MG capsule Take 2.5 mg by mouth at bedtime.  05/11/18  Yes [provider]  SYNTHROID 137 MCG tablet Take 137 mcg by mouth daily before breakfast.  07/20/16  Yes [provider]  aspirin EC 81 MG tablet Take 81 mg by mouth at bedtime.    [provider]  ferrous sulfate 325 (65 FE) MG tablet Take 325 mg by mouth daily with breakfast.    [provider]  magnesium oxide (MAG-OX) 400 MG tablet Take 400 mg by mouth daily.    [provider]  Multiple Vitamin (MULTI-VITAMINS) TABS Take 1 tablet by mouth daily.    [provider]     Allergies:    No Known Allergies   Physical Exam:   Vitals  Blood pressure 120/67, pulse 67, temperature 98.4 F (36.9 C), temperature source Oral, resp. rate 16, height 6\' 2"  (1.88 m), weight 97.9 kg, SpO2 97 %.  1.  General: Appears in no acute distress  2. Psychiatric: Alert, oriented to self, place, month.  Intact insight and judgment.  Flat affect.  3. Neurologic: Cranial nerves II through XII grossly intact, no focal deficit noted, motor strength 5/5  in all extremities  4. HEENMT:  Atraumatic normocephalic, extraocular muscles are intact  5. Respiratory : Clear to auscultation bilaterally, no wheezing or crackles auscultated  6. Cardiovascular : S1-S2, regular, no murmur  auscultated  7. Gastrointestinal:  Abdomen is soft, nontender, no organomegaly    Data Review:    CBC Recent Labs  Lab 08/15/19 1545  WBC 11.5*  HGB 12.2*  HCT 37.6*  PLT 220  MCV 107.7*  MCH 35.0*  MCHC 32.4  RDW 14.1  LYMPHSABS 2.1  MONOABS 1.1*  EOSABS 0.0  BASOSABS 0.0   ------------------------------------------------------------------------------------------------------------------  Results for orders placed or performed during the hospital encounter of 08/15/19 (from the past 48 hour(s))  CBC with Differential     Status: Abnormal   Collection Time: 08/15/19  3:45 PM  Result Value Ref Range   WBC 11.5 (H) 4.0 - 10.5 K/uL   RBC 3.49 (L) 4.22 - 5.81 MIL/uL   Hemoglobin 12.2 (L) 13.0 - 17.0 g/dL   HCT 37.6 (L) 39.0 - 52.0 %   MCV 107.7 (H) 80.0 - 100.0 fL   MCH 35.0 (H) 26.0 - 34.0 pg   MCHC 32.4 30.0 - 36.0 g/dL   RDW 14.1 11.5 - 15.5 %   Platelets 220 150 - 400 K/uL   nRBC 0.0 0.0 - 0.2 %   Neutrophils Relative % 73 %   Neutro Abs 8.3 (H) 1.7 - 7.7 K/uL   Lymphocytes Relative 18 %   Lymphs Abs 2.1 0.7 - 4.0 K/uL   Monocytes Relative 9 %   Monocytes Absolute 1.1 (H) 0.1 - 1.0 K/uL   Eosinophils Relative 0 %   Eosinophils Absolute 0.0 0.0 - 0.5 K/uL   Basophils Relative 0 %   Basophils Absolute 0.0 0.0 - 0.1 K/uL   Immature Granulocytes 0 %   Abs Immature Granulocytes 0.05 0.00 - 0.07 K/uL    Comment: Performed at [facility], [address], [place], [place] [address]  Troponin I (High Sensitivity)     Status: None   Collection Time: 08/15/19  3:45 PM  Result Value Ref Range   Troponin I (High Sensitivity) 13 <18 ng/L    Comment: (NOTE) Elevated high sensitivity troponin I (hsTnI) values and significant  changes across  serial measurements may suggest ACS but many other  chronic and acute conditions are known to elevate hsTnI results.  Refer to the "Links" section for chest pain algorithms and additional  guidance. Performed at [facility], [address] [place], [place] [address]   Ethanol     Status: None   Collection Time: 08/15/19  3:45 PM  Result Value Ref Range   Alcohol, Ethyl (B) <10 <10 mg/dL    Comment: (NOTE) Lowest detectable limit for serum alcohol is 10 mg/dL. For medical purposes only. Performed at [facility], [address] [place], [place] [address]   Comprehensive metabolic panel     Status: Abnormal   Collection Time: 08/15/19  4:50 PM  Result Value Ref Range   Sodium 137 135 - 145 mmol/L   Potassium 3.9 3.5 - 5.1 mmol/L   Chloride 104 98 - 111 mmol/L   CO2 24 22 - 32 mmol/L   Glucose, Bld 168 (H) 70 - 99 mg/dL    Comment: Glucose reference range applies only to samples taken after fasting for at least 8 hours.   BUN 25 (H) 8 - 23 mg/dL   Creatinine, Ser 1.84 (H) 0.61 - 1.24 mg/dL   Calcium 8.2 (L) 8.9 - 10.3 mg/dL   Total Protein 6.1 (L) 6.5 - 8.1 g/dL   Albumin 2.8 (L) 3.5 - 5.0 g/dL   AST 64 (H) 15 - 41 U/L   ALT 66 (  H) 0 - 44 U/L   Alkaline Phosphatase 66 38 - 126 U/L   Total Bilirubin 1.0 0.3 - 1.2 mg/dL   GFR calc non Af Amer 35 (L) >60 mL/min   GFR calc Af Amer 41 (L) >60 mL/min   Anion gap 9 5 - 15    Comment: Performed at [facility], [address] [place], [place] [address]  Troponin I (High Sensitivity)     Status: None   Collection Time: 08/15/19  5:32 PM  Result Value Ref Range   Troponin I (High Sensitivity) 13 <18 ng/L    Comment: (NOTE) Elevated high sensitivity troponin I (hsTnI) values and significant  changes across serial measurements may suggest ACS but many other  chronic and acute conditions are known to elevate hsTnI results.  Refer to the "Links" section for chest pain algorithms and additional  guidance. Performed at [facility], [address], [place], [place] [address]     Chemistries  Recent Labs  Lab 08/15/19 1650  NA 137  K 3.9  CL 104  CO2 24  GLUCOSE 168*  BUN 25*  CREATININE 1.84*  CALCIUM 8.2*  AST 64*  ALT 66*  ALKPHOS 66  BILITOT 1.0   ------------------------------------------------------------------------------------------------------------------  ------------------------------------------------------------------------------------------------------------------ GFR: Estimated Creatinine Clearance: 41 mL/min (A) (by C-G formula based on SCr of 1.84 mg/dL (H)). Liver Function Tests: Recent Labs  Lab 08/15/19 1650  AST 64*  ALT 66*  ALKPHOS 66  BILITOT 1.0  PROT 6.1*  ALBUMIN 2.8*    --------------------------------------------------------------------------------------------------------------- Urine analysis:    Component Value Date/Time   COLORURINE YELLOW 12/25/2017 [place] 12/25/2017 0958   LABSPEC 1.023 12/25/2017 0958   PHURINE 6.0 12/25/2017 0958   GLUCOSEU NEGATIVE 12/25/2017 0958   HGBUR NEGATIVE 12/25/2017 0958   BILIRUBINUR NEGATIVE 12/25/2017 0958   KETONESUR 5 (A) 12/25/2017 0958   PROTEINUR 30 (A) 12/25/2017 0958   UROBILINOGEN 0.2 01/05/2015 1245   NITRITE NEGATIVE 12/25/2017 0958   LEUKOCYTESUR NEGATIVE 12/25/2017 0958      Imaging Results:    DG Shoulder Right  Result Date: 08/15/2019 CLINICAL DATA:  Fall EXAM: RIGHT SHOULDER - 2+ VIEW COMPARISON:  None. FINDINGS: There is a comminuted mildly displaced impacted fracture seen through the surgical neck of the right humerus involving the inferior portion the greater tuberosity and the medial humeral head. The humeral head still partially articulates with the glenoid. There is diffuse osteopenia. Overlying soft tissue swelling is noted. IMPRESSION: Comminuted mildly displaced proximal humerus fracture. Electronically Signed   By: Prudencio Pair M.D.   On: 08/15/2019 01:29   CT Head Wo  Contrast  Result Date: 08/15/2019 CLINICAL DATA:  Fall EXAM: CT HEAD WITHOUT CONTRAST TECHNIQUE: Contiguous axial images were obtained from the base of the skull through the vertex without intravenous contrast. COMPARISON:  None. FINDINGS: Brain: There is atrophy and chronic small vessel disease changes. No acute intracranial abnormality. Specifically, no hemorrhage, hydrocephalus, mass lesion, acute infarction, or significant intracranial injury. Vascular: No hyperdense vessel or unexpected calcification. Skull: No acute calvarial abnormality. Sinuses/Orbits: Visualized paranasal sinuses and mastoids clear. Orbital soft tissues unremarkable. Other: None IMPRESSION: Atrophy, chronic microvascular disease. No acute intracranial abnormality. Electronically Signed   By: Rolm Baptise M.D.   On: 08/15/2019 17:30   CT Cervical Spine Wo Contrast  Result Date: 08/15/2019 CLINICAL DATA:  Fall EXAM: CT CERVICAL SPINE WITHOUT CONTRAST TECHNIQUE: Multidetector CT imaging of the cervical spine was performed without intravenous contrast. Multiplanar CT image reconstructions were  also generated. COMPARISON:  None. FINDINGS: Alignment: Normal Skull base and vertebrae: No acute fracture. No primary bone lesion or focal pathologic process. Soft tissues and spinal canal: No prevertebral fluid or swelling. No visible canal hematoma. Disc levels:  Diffuse degenerative disc disease and facet disease. Upper chest: Old healed right rib fractures.  No acute findings. Other: Carotid artery calcifications. IMPRESSION: Degenerative disc and facet disease.  No acute bony abnormality. Electronically Signed   By: Rolm Baptise M.D.   On: 08/15/2019 17:32    My personal review of EKG: Rhythm NSR, no ST-T changes   Assessment & Plan:    Active Problems:   AKI (acute kidney injury) ([place])   1. Acute kidney injury-patient has mild AKI, likely from poor p.o. intake.  Will place under observation, start normal saline at 100 mL/h.  Follow  BMP in a.m. 2. S/p fall, proximal humerus fracture-right shoulder and shoulder immobilizer.  Patient can follow-up with orthopedics as outpatient 3. Paroxysmal atrial fibrillation-heart rate is controlled, currently in normal sinus rhythm.  Continue anticoagulation with Pradaxa. 4. ?  Diabetes mellitus type 2-patient is not on medications at home, last A1c was 5.9 on 06/21/2018.  We will continue to monitor patient blood glucose in the hospital. 5. Hypothyroidism-continue Synthroid 6. Ischemic cardiomyopathy-history of V. tach-continue amiodarone, s/p biventricular ICD 7. CAD s/p CABG-continue aspirin, atenolol, atorvastatin, Zetia   DVT Prophylaxis-   Lovenox   AM Labs Ordered, also please review Full Orders  Family Communication: Admission, patients condition and plan of care including tests being ordered have been discussed with the patient  who indicate understanding and agree with the plan and Code Status.  Code Status: Full code  Admission status: Observation  Time spent in minutes : 60 minutes   Markeda Narvaez S Neeya Prigmore M.D

## 2019-08-15 NOTE — Discharge Instructions (Addendum)
Use ice packs over the painful area.  You should have plenty of pain pills to take already for your pain.  Wear the shoulder immobilizer for comfort.  Please call Dr. Ruthe Mannan office to get an appointment.  He may want to start scheduling you for physical therapy in a couple of weeks, he also can tell you if this is something that needs surgery.  Return to the emergency department if you get numbness in your fingers, your fingers get cold or painful or it seems worse.

## 2019-08-15 NOTE — Progress Notes (Signed)
ANTICOAGULATION CONSULT NOTE - Initial Consult  Pharmacy Consult for dabigatran  Indication: atrial fibrillation  No Known Allergies  Patient Measurements: Height: 6\' 2"  (188 cm) Weight: 91.4 kg (201 lb 8 oz) IBW/kg (Calculated) : 82.2  Vital Signs: Temp: 97.6 F (36.4 C) (06/01 2035) Temp Source: Oral (06/01 2035) BP: 156/74 (06/01 2035) Pulse Rate: 69 (06/01 2035)  Labs: Recent Labs    08/15/19 1545 08/15/19 1650 08/15/19 1732  HGB 12.2*  --   --   HCT 37.6*  --   --   PLT 220  --   --   CREATININE  --  1.84*  --   TROPONINIHS 13  --  13    Estimated Creatinine Clearance: 41 mL/min (A) (by C-G formula based on SCr of 1.84 mg/dL (H)).   Medical History: Past Medical History:  Diagnosis Date  . CAD (coronary artery disease)   . DM (diabetes mellitus) ([place])    type 2   . DVT (deep venous thrombosis) ([place])   . Hyperlipidemia   . Hypertension   . ICD (implantable cardioverter-defibrillator), single, in situ   . Ischemic cardiomyopathy   . PAF (paroxysmal atrial fibrillation) ([place])   . RBBB   . S/P CABG x 5     Medications:  Medications Prior to Admission  Medication Sig Dispense Refill Last Dose  . ALPRAZolam (XANAX) 1 MG tablet Take 1 tablet (1 mg total) by mouth daily as needed for anxiety or sleep. (Patient taking differently: Take 1 mg by mouth 4 (four) times daily as needed for anxiety or sleep. ) 10 tablet 0 unknown  . amiodarone (PACERONE) 200 MG tablet Take 200 mg by mouth daily.   unknown  . atorvastatin (LIPITOR) 40 MG tablet Take 40 mg by mouth at bedtime.   unknown  . ezetimibe (ZETIA) 10 MG tablet TAKE 1 TABLET ONCE DAILY FOR CHOLESTEROL. (Patient taking differently: Take 10 mg by mouth daily. ) 30 tablet 11 unknown  . metoprolol tartrate (LOPRESSOR) 50 MG tablet TAKE 1 TABLET BY MOUTH TWICE DAILY. (Patient taking differently: Take 50 mg by mouth 2 (two) times daily. ) 180 tablet 3 unknown  . niacin (NIASPAN) 1000 MG CR tablet TAKE (1) TABLET BY  MOUTH AT BEDTIME. (Patient taking differently: Take 1,000 mg by mouth at bedtime. ) 90 tablet 3 unknown  . nitroGLYCERIN (NITROSTAT) 0.4 MG SL tablet Place 0.4 mg under the tongue every 5 (five) minutes x 3 doses as needed for chest pain.   unknown  . Oxycodone HCl 20 MG TABS Take 1 tablet by mouth 4 (four) times daily as needed (for pain).    unknown  . ramipril (ALTACE) 2.5 MG capsule Take 2.5 mg by mouth at bedtime.    unknown  . SYNTHROID 137 MCG tablet Take 137 mcg by mouth daily before breakfast.    unknown  . aspirin EC 81 MG tablet Take 81 mg by mouth at bedtime.     . ferrous sulfate 325 (65 FE) MG tablet Take 325 mg by mouth daily with breakfast.     . magnesium oxide (MAG-OX) 400 MG tablet Take 400 mg by mouth daily.     . Multiple Vitamin (MULTI-VITAMINS) TABS Take 1 tablet by mouth daily.       Assessment: Pharmacy consulted to dose dabigatran for nonvalvular atrial fibrillation. Patient's PMH lists DVT.  H&P states paroxysmal atrial fibrillation on Pradaxa.   Reconcile Outside Info lists dabigatran 75mg  po BID started 08/19/18.  Patient is also on  amiodarone which interacts with dabigatran.  Patient's current estimated creatinine clearance is between 30 - 50 ml/min.  Per [employer]rom[employer], reduce dabigatran (75mg  po BID in moderate renal impairment when CrCl 30-50 ml/min) if concomitant use with amiodarone is necessary.  Goal of Therapy:  Prevent thromboembolism Monitor platelets by anticoagulation protocol: Yes   Plan:  Dabigatran 75 mg po BID Monitor renal function, CBC, s/s bleeding   Efraim Kaufmann, PharmD, BCPS 08/15/2019,10:01 PM

## 2019-08-15 NOTE — ED Provider Notes (Signed)
[facility] EMERGENCY DEPARTMENT Provider Note   CSN: [id number] Arrival date & time: 08/15/19  0032   Time seen 12:50 AM  History Chief Complaint  Patient presents with  . Alan Orr is a 74 y.o. male.  HPI Patient states about 5:00 the evening of May 31 he slipped while getting into his truck and fell hitting his right shoulder on the curb.  He did not hit his head and did not have loss of consciousness.  He states he has pain in his shoulder that he was hoping would improve but it has not.  He denies any numbness of his fingers.  Patient states he is right-handed.  He states he does not have an orthopedist.  PCP [name], MD     Past Medical History:  Diagnosis Date  . CAD (coronary artery disease)   . DM (diabetes mellitus) ([place])    type 2   . DVT (deep venous thrombosis) ([place])   . Hyperlipidemia   . Hypertension   . ICD (implantable cardioverter-defibrillator), single, in situ   . Ischemic cardiomyopathy   . PAF (paroxysmal atrial fibrillation) ([place])   . RBBB   . S/P CABG x 5     Patient Active Problem List   Diagnosis Date Noted  . Cellulitis of right lower extremity 06/21/2018  . Salmonella sepsis ([place]) 10/21/2016  . Salmonella gastroenteritis 10/20/2016  . Transaminasemia 10/18/2016  . Pancytopenia ([place]) 10/18/2016  . DM type 2 (diabetes mellitus, type 2) ([place]) 10/18/2016  . Thrombocytopenia ([place]) 08/22/2016  . Tick bite of right thigh 08/22/2016  . Abnormal serum protein electrophoresis 08/21/2014  . Leg wound, left 11/03/2013  . History of carotid artery disease 03/03/2013  . Cardiomyopathy, ischemic 11/08/2012  . CAD s/p CABG  11/08/2012  . Hyperlipidemia 11/08/2012  . Paroxysmal atrial fibrillation ([place]) 11/08/2012  . Ventricular tachycardia ([place]) 11/08/2012  . Type 2 diabetes mellitus with stage 3 chronic kidney disease ([place]) 08/20/2009  . PACEMAKER, PERMANENT 08/20/2009  . Biventricular ICD (implantable  cardioverter-defibrillator) in place 08/20/2009    Past Surgical History:  Procedure Laterality Date  . BACK SURGERY    . CARDIAC CATHETERIZATION  05/08/2009   small vessel disease  . CARDIAC DEFIBRILLATOR PLACEMENT  05/09/2009   [employer]  . CORONARY ARTERY BYPASS GRAFT  12/31/1995   LIMA to LAD,SVG to intermediate,SVG to CX,seq. svg to posterior descending and posterolateral RCA  . EP IMPLANTABLE DEVICE N/A 09/25/2015   Procedure: PPM/BIV PPM Generator Changeout;  Surgeon: Evans Lance, MD;  Location: [facility];  Service: Cardiovascular;  Laterality: N/A;  . I & D EXTREMITY Left 11/03/2013   Procedure: Left Leg Debride Ulcer, Apply Wound VAC and [place];  Surgeon: Newt Minion, MD;  Location: [facility];  Service: Orthopedics;  Laterality: Left;  . TEE WITHOUT CARDIOVERSION N/A 01/11/2015   Procedure: TRANSESOPHAGEAL ECHOCARDIOGRAM (TEE);  Surgeon: Herminio Commons, MD;  Location: [facility];  Service: Cardiology;  Laterality: N/A;       Family History  Problem Relation Age of Onset  . Heart attack Mother   . Stroke Father     Social History   Tobacco Use  . Smoking status: Former Smoker    Packs/day: 1.50    Years: 35.00    Pack years: 52.50    Types: Cigarettes    Quit date: 11/03/1995    Years since quitting: 23.7  . Smokeless tobacco: Never Used  Substance Use Topics  . Alcohol  use: Yes    Comment: occasionally  . Drug use: No  lives at home  Home Medications Prior to Admission medications   Medication Sig Start Date End Date Taking? Authorizing Provider  ALPRAZolam Duanne Moron) 1 MG tablet Take 1 tablet (1 mg total) by mouth daily as needed for anxiety or sleep. 10/22/16   Kathie Dike, MD  amiodarone (PACERONE) 200 MG tablet Take 200 mg by mouth daily.    [provider]  aspirin EC 81 MG tablet Take 81 mg by mouth at bedtime.    [provider]  atorvastatin (LIPITOR) 40 MG tablet Take 40 mg by mouth at bedtime.    [provider]  dabigatran (PRADAXA) 75 MG CAPS capsule Take 1 capsule (75 mg total) by mouth 2 (two) times daily. OV NEEDED 08/19/18   Croitoru, Dani Gobble, MD  doxycycline (VIBRAMYCIN) 100 MG capsule Take 1 capsule (100 mg total) by mouth 2 (two) times daily. 06/22/18   Kathie Dike, MD  ezetimibe (ZETIA) 10 MG tablet TAKE 1 TABLET ONCE DAILY FOR CHOLESTEROL. 01/04/17   Lorretta Harp, MD  ferrous sulfate 325 (65 FE) MG tablet Take 325 mg by mouth daily with breakfast.    [provider]  magnesium oxide (MAG-OX) 400 MG tablet Take 400 mg by mouth daily.    [provider]  metoprolol tartrate (LOPRESSOR) 50 MG tablet TAKE 1 TABLET BY MOUTH TWICE DAILY. 10/10/18   Evans Lance, MD  Multiple Vitamin (MULTI-VITAMINS) TABS Take 1 tablet by mouth daily.    [provider]  niacin (NIASPAN) 1000 MG CR tablet TAKE (1) TABLET BY MOUTH AT BEDTIME. 10/15/16   Bayard Hugger, MD  nitroGLYCERIN (NITROSTAT) 0.4 MG SL tablet Place 0.4 mg under the tongue every 5 (five) minutes x 3 doses as needed for chest pain. 10/31/15   [provider]  oxyCODONE-acetaminophen (PERCOCET/ROXICET) 5-325 MG tablet Take 1 tablet by mouth every 4 (four) hours as needed for severe pain. 12/25/17   Nat Christen, MD  ramipril (ALTACE) 2.5 MG capsule Take 2.5 mg by mouth at bedtime.  05/11/18   [provider]  SYNTHROID 137 MCG tablet Take 137 mcg by mouth daily before breakfast.  07/20/16   [provider]  zolpidem (AMBIEN) 10 MG tablet Take 10 mg by mouth at bedtime.  05/20/18   [provider]    Allergies    Patient has no known allergies.  Review of Systems   Review of Systems  All other systems reviewed and are negative.   Physical Exam Updated Vital Signs BP (!) 144/76 (BP Location: Left Arm)   Pulse 74   Temp (!) 97.5 F (36.4 C) (Oral)   Resp 18   Ht 6\' 2"  (1.88 m)   Wt 98 kg   SpO2 100%   BMI 27.73 kg/m   Physical Exam Vitals and nursing note  reviewed.  Constitutional:      Appearance: Normal appearance. He is normal weight.  HENT:     Head: Normocephalic and atraumatic.     Right Ear: External ear normal.     Left Ear: External ear normal.  Eyes:     Extraocular Movements: Extraocular movements intact.     Conjunctiva/sclera: Conjunctivae normal.     Pupils: Pupils are equal, round, and reactive to light.  Cardiovascular:     Rate and Rhythm: Normal rate and regular rhythm.  Pulmonary:     Effort: Pulmonary effort is normal. No respiratory distress.  Musculoskeletal:  General: Tenderness present.     Cervical back: Normal range of motion and neck supple.     Comments: Patient's right clavicle is nontender to palpation.  However he appears to be very painful in his right shoulder.  He has restricted range of motion because of pain there.  He has minor discomfort in the elbow but it seems to be more pain radiating from his shoulder.  He is nontender in the forearm or wrist or hands.  He has good distal pulses and capillary refill.  He denies any numbness when I touch his fingers.  He is able to move his fingers well.  Skin:    General: Skin is warm and dry.  Neurological:     General: No focal deficit present.     Mental Status: He is alert and oriented to person, place, and time.     Cranial Nerves: No cranial nerve deficit.  Psychiatric:        Mood and Affect: Mood normal.        Behavior: Behavior normal.        Thought Content: Thought content normal.     ED Results / Procedures / Treatments   Labs (all labs ordered are listed, but only abnormal results are displayed) Labs Reviewed - No data to display  EKG None  Radiology DG Shoulder Right  Result Date: 08/15/2019 CLINICAL DATA:  Fall EXAM: RIGHT SHOULDER - 2+ VIEW COMPARISON:  None. FINDINGS: There is a comminuted mildly displaced impacted fracture seen through the surgical neck of the right humerus involving the inferior portion the greater  tuberosity and the medial humeral head. The humeral head still partially articulates with the glenoid. There is diffuse osteopenia. Overlying soft tissue swelling is noted. IMPRESSION: Comminuted mildly displaced proximal humerus fracture. Electronically Signed   By: Prudencio Pair M.D.   On: 08/15/2019 01:29    Procedures Procedures (including critical care time)  Medications Ordered in ED Medications  fentaNYL (SUBLIMAZE) injection 50 mcg (50 mcg Intramuscular Given 08/15/19 0109)    ED Course  I have reviewed the triage vital signs and the nursing notes.  Pertinent labs & imaging results that were available during my care of the patient were reviewed by me and considered in my medical decision making (see chart for details).    MDM Rules/Calculators/A&P                     Patient was given fentanyl IM for pain, x-rays were ordered of his right shoulder for suspected fracture of his humerus.  1:35 AM I talked to the patient about his x-ray results.  He was placed in a shoulder immobilizer.  Patient states he lives in an apartment alone but he can get help if he needs it.  Review the [place] shows patient got #120 oxycodone 20 mg tablets monthly filled on May 9, prior to that he was getting #90 tabs monthly.  He also gets #120 alprazolam 1 mg tablets monthly, April 9.  Final Clinical Impression(s) / ED Diagnoses Final diagnoses:  Fall, initial encounter  Other closed displaced fracture of proximal end of right humerus, initial encounter    Rx / DC Orders ED Discharge Orders    None     Plan discharge  Rolland Porter, MD, [place] discharge  Rolland Porter, MD, Barbette Or, MD 08/15/19 [phone]

## 2019-08-15 NOTE — ED Notes (Signed)
Attempt to call report x 1  

## 2019-08-15 NOTE — ED Triage Notes (Signed)
Pt brought in by EMS for fall with c/o R shoulder pain.

## 2019-08-16 DIAGNOSIS — N179 Acute kidney failure, unspecified: Secondary | ICD-10-CM | POA: Diagnosis not present

## 2019-08-16 LAB — COMPREHENSIVE METABOLIC PANEL
ALT: 59 U/L — ABNORMAL HIGH (ref 0–44)
AST: 51 U/L — ABNORMAL HIGH (ref 15–41)
Albumin: 2.7 g/dL — ABNORMAL LOW (ref 3.5–5.0)
Alkaline Phosphatase: 62 U/L (ref 38–126)
Anion gap: 6 (ref 5–15)
BUN: 27 mg/dL — ABNORMAL HIGH (ref 8–23)
CO2: 27 mmol/L (ref 22–32)
Calcium: 8.2 mg/dL — ABNORMAL LOW (ref 8.9–10.3)
Chloride: 105 mmol/L (ref 98–111)
Creatinine, Ser: 1.71 mg/dL — ABNORMAL HIGH (ref 0.61–1.24)
GFR calc Af Amer: 45 mL/min — ABNORMAL LOW (ref >60)
GFR calc non Af Amer: 39 mL/min — ABNORMAL LOW (ref >60)
Glucose, Bld: 143 mg/dL — ABNORMAL HIGH (ref 70–99)
Potassium: 4.4 mmol/L (ref 3.5–5.1)
Sodium: 138 mmol/L (ref 135–145)
Total Bilirubin: 1 mg/dL (ref 0.3–1.2)
Total Protein: 6 g/dL — ABNORMAL LOW (ref 6.5–8.1)

## 2019-08-16 LAB — CBC
HCT: 34.3 % — ABNORMAL LOW (ref 39.0–52.0)
Hemoglobin: 11.2 g/dL — ABNORMAL LOW (ref 13.0–17.0)
MCH: 35.2 pg — ABNORMAL HIGH (ref 26.0–34.0)
MCHC: 32.7 g/dL (ref 30.0–36.0)
MCV: 107.9 fL — ABNORMAL HIGH (ref 80.0–100.0)
Platelets: 191 10*3/uL (ref 150–400)
RBC: 3.18 MIL/uL — ABNORMAL LOW (ref 4.22–5.81)
RDW: 14.2 % (ref 11.5–15.5)
WBC: 10.8 10*3/uL — ABNORMAL HIGH (ref 4.0–10.5)
nRBC: 0 % (ref 0.0–0.2)

## 2019-08-16 MED ORDER — DABIGATRAN ETEXILATE MESYLATE 75 MG PO CAPS: 75.0000 mg | ORAL_CAPSULE | Freq: Two times a day (BID) | ORAL | Status: AC

## 2019-08-16 NOTE — Plan of Care (Signed)

## 2019-08-16 NOTE — Discharge Summary (Signed)
Physician Discharge Summary  Alan Orr [id number] DOB: 1945/10/07 DOA: 08/15/2019  PCP: [name], MD  Admit date: 08/15/2019 Discharge date: 08/16/2019  Admitted From: Home  Disposition:  Home with [facility]   Recommendations for Outpatient Follow-up:  1. Follow up with PCP in 1 week 2. HHRN: Please obtain BMP in one week 3. Keep follow up appointment with Dr. Arther Abbott as previously recommended     Home Health: PT/OT/RN/Aide/SW due to patients inability to safely leave home alone  Equipment/Devices: Cane, 3-in-1  Discharge Condition: Poor  CODE STATUS: FULL Diet recommendation: Regular  Brief/Interim Summary: Alan Orr is a 74 y.o. M with hx CAD s/p CABG, isch CM, pAF on Pradaxa, hx VT with pacer/defibrillator, hx DVT, and HTN as well as chronic pain on high dose opiates plus benzodiazepines who presented with fall.  Patient has been weak and falling recently.  Several days ago he fell getting out of his truck, slipped on the running board, and broke his proximal humerus.  Unfortunately he lives alone, drinks alcohol, and uses high-dose oxycodone and Xanax and so he fell again and this time could not get up because of his broken arm.  In the ER, his creatinine was slightly up.  No fever or leukocytosis.  Started on IV fluids and admitted to the hospital service.         PRINCIPAL HOSPITAL DIAGNOSIS: Dehydration    Discharge Diagnoses:   Dehydration The patient fell and was unable to get up.  As result he became dehydrated and had a mild AKI.  Ramipril held and fluids administered.  His creatinine improved overnight on IV fluids. -Hold ramipril -HH RN to obtain BMP in 1 week and fax to PCP    Recent proximal humerus fracture Patient has follow up pending with Dr. Aline Orr, contact info given again.   Dementia versus over-medication The patient is hard of hearing severely, and also takes large doses of oxycodone and Xanax.  This is clinically  indistinguishable from dementia. -It is medically important that this patient be tapered off of his oxycodone and Xanax  Ischemic cardiomyopathy Coronary disease, secondary prevention History of ventricular tachycardia with defibrillator in place Paroxysmal atrial fibrillation Patient witnessed to have brief nonsustained VT in the hospital. Continue amiodarone and Pradaxa, dose corrected in the system.             Discharge Instructions  Discharge Instructions    Discharge instructions   Complete by: As directed    From Dr. Loleta Orr: You were admitted for fall.  You were dehydrated.  You should take your normal medicines for now except DO NOT take ramipril.  Take as little Xanax and oxycodone as you can and talk to your doctor about stopping these medicines.  Home health RN to check basic metabolic panel with creatinine in 1 week and fax to PCP  Push fluids  Also, you should be taking Pradaxa 75 mg twice daily.  Make sure you are taking this.  Show these instructions to your home health nurse when they come out   Increase activity slowly   Complete by: As directed      Allergies as of 08/16/2019   No Known Allergies     Medication List    STOP taking these medications   ramipril 2.5 MG capsule Commonly known as: ALTACE     TAKE these medications   ALPRAZolam 1 MG tablet Commonly known as: XANAX Take 1 tablet (1 mg total) by mouth daily as needed for  anxiety or sleep. What changed: when to take this   amiodarone 200 MG tablet Commonly known as: PACERONE Take 200 mg by mouth daily.   aspirin EC 81 MG tablet Take 81 mg by mouth at bedtime.   atorvastatin 40 MG tablet Commonly known as: LIPITOR Take 40 mg by mouth at bedtime.   dabigatran 75 MG Caps capsule Commonly known as: PRADAXA Take 1 capsule (75 mg total) by mouth every 12 (twelve) hours.   ezetimibe 10 MG tablet Commonly known as: ZETIA TAKE 1 TABLET ONCE DAILY FOR CHOLESTEROL. What  changed: See the new instructions.   ferrous sulfate 325 (65 FE) MG tablet Take 325 mg by mouth daily with breakfast.   magnesium oxide 400 MG tablet Commonly known as: MAG-OX Take 400 mg by mouth daily.   metoprolol tartrate 50 MG tablet Commonly known as: LOPRESSOR TAKE 1 TABLET BY MOUTH TWICE DAILY.   Multi-Vitamins Tabs Take 1 tablet by mouth daily.   niacin 1000 MG CR tablet Commonly known as: NIASPAN TAKE (1) TABLET BY MOUTH AT BEDTIME. What changed: See the new instructions.   nitroGLYCERIN 0.4 MG SL tablet Commonly known as: NITROSTAT Place 0.4 mg under the tongue every 5 (five) minutes x 3 doses as needed for chest pain.   Oxycodone HCl 20 MG Tabs Take 1 tablet by mouth 4 (four) times daily as needed (for pain).   Synthroid 137 MCG tablet Generic drug: levothyroxine Take 137 mcg by mouth daily before breakfast.            Durable Medical Equipment  (From admission, onward)         Start     Ordered   08/16/19 1522  DME Cane  Once     08/16/19 1521   08/16/19 1521  DME 3-in-1  Once     08/16/19 1521         Follow-up Information    [facility] Follow up.   Specialty: Home Health Services Why:  PT/RN/AIDE/OT/SW Contact information: [place] [address] [phone]          No Known Allergies  Consultations:     Procedures/Studies: DG Shoulder Right  Result Date: 08/15/2019 CLINICAL DATA:  Fall EXAM: RIGHT SHOULDER - 2+ VIEW COMPARISON:  None. FINDINGS: There is a comminuted mildly displaced impacted fracture seen through the surgical neck of the right humerus involving the inferior portion the greater tuberosity and the medial humeral head. The humeral head still partially articulates with the glenoid. There is diffuse osteopenia. Overlying soft tissue swelling is noted. IMPRESSION: Comminuted mildly displaced proximal humerus fracture. Electronically Signed   By: Prudencio Pair M.D.   On:  08/15/2019 01:29   CT Head Wo Contrast  Result Date: 08/15/2019 CLINICAL DATA:  Fall EXAM: CT HEAD WITHOUT CONTRAST TECHNIQUE: Contiguous axial images were obtained from the base of the skull through the vertex without intravenous contrast. COMPARISON:  None. FINDINGS: Brain: There is atrophy and chronic small vessel disease changes. No acute intracranial abnormality. Specifically, no hemorrhage, hydrocephalus, mass lesion, acute infarction, or significant intracranial injury. Vascular: No hyperdense vessel or unexpected calcification. Skull: No acute calvarial abnormality. Sinuses/Orbits: Visualized paranasal sinuses and mastoids clear. Orbital soft tissues unremarkable. Other: None IMPRESSION: Atrophy, chronic microvascular disease. No acute intracranial abnormality. Electronically Signed   By: Rolm Baptise M.D.   On: 08/15/2019 17:30   CT Cervical Spine Wo Contrast  Result Date: 08/15/2019 CLINICAL DATA:  Fall EXAM: CT CERVICAL SPINE  WITHOUT CONTRAST TECHNIQUE: Multidetector CT imaging of the cervical spine was performed without intravenous contrast. Multiplanar CT image reconstructions were also generated. COMPARISON:  None. FINDINGS: Alignment: Normal Skull base and vertebrae: No acute fracture. No primary bone lesion or focal pathologic process. Soft tissues and spinal canal: No prevertebral fluid or swelling. No visible canal hematoma. Disc levels:  Diffuse degenerative disc disease and facet disease. Upper chest: Old healed right rib fractures.  No acute findings. Other: Carotid artery calcifications. IMPRESSION: Degenerative disc and facet disease.  No acute bony abnormality. Electronically Signed   By: Rolm Baptise M.D.   On: 08/15/2019 17:32       Subjective: The patient has no complaints.  He has no fever, chest pain, trouble breathing.  His arm is sore because he "sprained it".  Discharge Exam: Vitals:   08/16/19 1008 08/16/19 1540  BP: (!) 113/58 122/78  Pulse: 61 63  Resp: 19 19   Temp: 98.5 F (36.9 C) 98.7 F (37.1 C)  SpO2: 95% 98%   Vitals:   08/16/19 0507 08/16/19 0749 08/16/19 1008 08/16/19 1540  BP: 110/64  (!) 113/58 122/78  Pulse: 60  61 63  Resp: 16  19 19   Temp: 98.2 F (36.8 C)  98.5 F (36.9 C) 98.7 F (37.1 C)  TempSrc: Oral  Oral Oral  SpO2: 94% 96% 95% 98%  Weight:      Height:        General: Pt is alert, awake, not in acute distress, working with physical therapy, ambulating slowly in the hall. Cardiovascular: RRR, nl S1-S2, no murmurs appreciated.   No LE edema.   Respiratory: Normal respiratory rate and rhythm.  CTAB without rales or wheezes. Abdominal: Abdomen soft and non-tender.  No distension or HSM.   Neuro/Psych: Strength in the right upper extremity limited by pain.  Judgment and insight appear impaired.  Severely hard of hearing.   The results of significant diagnostics from this hospitalization (including imaging, microbiology, ancillary and laboratory) are listed below for reference.     Microbiology: Recent Results (from the past 240 hour(s))  SARS Coronavirus 2 by RT PCR (hospital order, performed in [facility] hospital lab) Nasopharyngeal Nasopharyngeal Swab     Status: None   Collection Time: 08/15/19  7:00 PM   Specimen: Nasopharyngeal Swab  Result Value Ref Range Status   SARS Coronavirus 2 NEGATIVE NEGATIVE Final    Comment: (NOTE) SARS-CoV-2 target nucleic acids are NOT DETECTED. The SARS-CoV-2 RNA is generally detectable in upper and lower respiratory specimens during the acute phase of infection. The lowest concentration of SARS-CoV-2 viral copies this assay can detect is 250 copies / mL. A negative result does not preclude SARS-CoV-2 infection and should not be used as the sole basis for treatment or other patient management decisions.  A negative result may occur with improper specimen collection / handling, submission of specimen other than nasopharyngeal swab, presence of viral mutation(s) within  the areas targeted by this assay, and inadequate number of viral copies (<250 copies / mL). A negative result must be combined with clinical observations, patient history, and epidemiological information. Fact Sheet for Patients:   StrictlyIdeas.no Fact Sheet for Healthcare Providers: BankingDealers.co.za This test is not yet approved or cleared  by the [place] FDA and has been authorized for detection and/or diagnosis of SARS-CoV-2 by FDA under an Emergency Use Authorization (EUA).  This EUA will remain in effect (meaning this test can be used) for the duration of the COVID-19 declaration  under Section 564(b)(1) of the Act, 21 U.S.C. section 360bbb-3(b)(1), unless the authorization is terminated or revoked sooner. Performed at [facility], [address] [place], [place] [address]      Labs: BNP (last 3 results) No results for input(s): BNP in the last 8760 hours. Basic Metabolic Panel: Recent Labs  Lab 08/15/19 1650 08/16/19 0442  NA 137 138  K 3.9 4.4  CL 104 105  CO2 24 27  GLUCOSE 168* 143*  BUN 25* 27*  CREATININE 1.84* 1.71*  CALCIUM 8.2* 8.2*   Liver Function Tests: Recent Labs  Lab 08/15/19 1650 08/16/19 0442  AST 64* 51*  ALT 66* 59*  ALKPHOS 66 62  BILITOT 1.0 1.0  PROT 6.1* 6.0*  ALBUMIN 2.8* 2.7*   No results for input(s): LIPASE, AMYLASE in the last 168 hours. No results for input(s): AMMONIA in the last 168 hours. CBC: Recent Labs  Lab 08/15/19 1545 08/16/19 0442  WBC 11.5* 10.8*  NEUTROABS 8.3*  --   HGB 12.2* 11.2*  HCT 37.6* 34.3*  MCV 107.7* 107.9*  PLT 220 191   Cardiac Enzymes: No results for input(s): CKTOTAL, CKMB, CKMBINDEX, TROPONINI in the last 168 hours. BNP: Invalid input(s): POCBNP CBG: No results for input(s): GLUCAP in the last 168 hours. D-Dimer No results for input(s): DDIMER in the last 72 hours. Hgb A1c No results for input(s): HGBA1C in the last 72  hours. Lipid Profile No results for input(s): CHOL, HDL, LDLCALC, TRIG, CHOLHDL, LDLDIRECT in the last 72 hours. Thyroid function studies No results for input(s): TSH, T4TOTAL, T3FREE, THYROIDAB in the last 72 hours.  Invalid input(s): FREET3 Anemia work up No results for input(s): VITAMINB12, FOLATE, FERRITIN, TIBC, IRON, RETICCTPCT in the last 72 hours. Urinalysis    Component Value Date/Time   COLORURINE YELLOW 12/25/2017 [place] 12/25/2017 0958   LABSPEC 1.023 12/25/2017 0958   PHURINE 6.0 12/25/2017 0958   GLUCOSEU NEGATIVE 12/25/2017 0958   HGBUR NEGATIVE 12/25/2017 0958   BILIRUBINUR NEGATIVE 12/25/2017 0958   KETONESUR 5 (A) 12/25/2017 0958   PROTEINUR 30 (A) 12/25/2017 0958   UROBILINOGEN 0.2 01/05/2015 1245   NITRITE NEGATIVE 12/25/2017 0958   LEUKOCYTESUR NEGATIVE 12/25/2017 0958   Sepsis Labs Invalid input(s): PROCALCITONIN,  WBC,  LACTICIDVEN Microbiology Recent Results (from the past 240 hour(s))  SARS Coronavirus 2 by RT PCR (hospital order, performed in [facility] hospital lab) Nasopharyngeal Nasopharyngeal Swab     Status: None   Collection Time: 08/15/19  7:00 PM   Specimen: Nasopharyngeal Swab  Result Value Ref Range Status   SARS Coronavirus 2 NEGATIVE NEGATIVE Final    Comment: (NOTE) SARS-CoV-2 target nucleic acids are NOT DETECTED. The SARS-CoV-2 RNA is generally detectable in upper and lower respiratory specimens during the acute phase of infection. The lowest concentration of SARS-CoV-2 viral copies this assay can detect is 250 copies / mL. A negative result does not preclude SARS-CoV-2 infection and should not be used as the sole basis for treatment or other patient management decisions.  A negative result may occur with improper specimen collection / handling, submission of specimen other than nasopharyngeal swab, presence of viral mutation(s) within the areas targeted by this assay, and inadequate number of viral copies (<250  copies / mL). A negative result must be combined with clinical observations, patient history, and epidemiological information. Fact Sheet for Patients:   StrictlyIdeas.no Fact Sheet for Healthcare Providers: BankingDealers.co.za This test is not yet approved or cleared  by the [place]  FDA and has been authorized for detection and/or diagnosis of SARS-CoV-2 by FDA under an Emergency Use Authorization (EUA).  This EUA will remain in effect (meaning this test can be used) for the duration of the COVID-19 declaration under Section 564(b)(1) of the Act, 21 U.S.C. section 360bbb-3(b)(1), unless the authorization is terminated or revoked sooner. Performed at [facility], [address] [place], [place] [address]      Time coordinating discharge: 35 minutes The [place] controlled substances registry was reviewed for this patient      SIGNED:   Edwin Dada, MD  [facility] 08/16/2019, 4:26 PM

## 2019-08-16 NOTE — Plan of Care (Signed)
  Problem: Education: Goal: Knowledge of General Education information will improve Description: Including pain rating scale, medication(s)/side effects and non-pharmacologic comfort measures 08/16/2019 1559 by Melony Overly, RN Outcome: Adequate for Discharge 08/16/2019 0907 by Melony Overly, RN Outcome: Progressing   Problem: Health Behavior/Discharge Planning: Goal: Ability to manage health-related needs will improve 08/16/2019 1559 by Melony Overly, RN Outcome: Adequate for Discharge 08/16/2019 0907 by Melony Overly, RN Outcome: Progressing   Problem: Clinical Measurements: Goal: Ability to maintain clinical measurements within normal limits will improve 08/16/2019 1559 by Melony Overly, RN Outcome: Adequate for Discharge 08/16/2019 0907 by Melony Overly, RN Outcome: Progressing Goal: Will remain free from infection 08/16/2019 1559 by Melony Overly, RN Outcome: Adequate for Discharge 08/16/2019 0907 by Melony Overly, RN Outcome: Progressing Goal: Diagnostic test results will improve 08/16/2019 1559 by Melony Overly, RN Outcome: Adequate for Discharge 08/16/2019 0907 by Melony Overly, RN Outcome: Progressing Goal: Respiratory complications will improve 08/16/2019 1559 by Melony Overly, RN Outcome: Adequate for Discharge 08/16/2019 0907 by Melony Overly, RN Outcome: Progressing Goal: Cardiovascular complication will be avoided 08/16/2019 1559 by Melony Overly, RN Outcome: Adequate for Discharge 08/16/2019 0907 by Melony Overly, RN Outcome: Progressing   Problem: Activity: Goal: Risk for activity intolerance will decrease 08/16/2019 1559 by Melony Overly, RN Outcome: Adequate for Discharge 08/16/2019 0907 by Melony Overly, RN Outcome: Progressing   Problem: Nutrition: Goal: Adequate nutrition will be maintained 08/16/2019 1559 by Melony Overly, RN Outcome: Adequate for Discharge 08/16/2019 0907 by Melony Overly, RN Outcome: Progressing   Problem:  Coping: Goal: Level of anxiety will decrease 08/16/2019 1559 by Melony Overly, RN Outcome: Adequate for Discharge 08/16/2019 0907 by Melony Overly, RN Outcome: Progressing   Problem: Elimination: Goal: Will not experience complications related to bowel motility 08/16/2019 1559 by Melony Overly, RN Outcome: Adequate for Discharge 08/16/2019 0907 by Melony Overly, RN Outcome: Progressing Goal: Will not experience complications related to urinary retention 08/16/2019 1559 by Melony Overly, RN Outcome: Adequate for Discharge 08/16/2019 0907 by Melony Overly, RN Outcome: Progressing   Problem: Pain Managment: Goal: General experience of comfort will improve 08/16/2019 1559 by Melony Overly, RN Outcome: Adequate for Discharge 08/16/2019 0907 by Melony Overly, RN Outcome: Progressing   Problem: Safety: Goal: Ability to remain free from injury will improve 08/16/2019 1559 by Melony Overly, RN Outcome: Adequate for Discharge 08/16/2019 0907 by Melony Overly, RN Outcome: Progressing   Problem: Skin Integrity: Goal: Risk for impaired skin integrity will decrease 08/16/2019 1559 by Melony Overly, RN Outcome: Adequate for Discharge 08/16/2019 0907 by Melony Overly, RN Outcome: Progressing

## 2019-08-16 NOTE — Evaluation (Signed)
Physical Therapy Evaluation Patient Details Name: DEATON BYNES MRN: [id number] DOB: 1946/02/22 Today's Date: 08/16/2019   History of Present Illness  Eyden Schey  is a 74 y.o. male, with medical history of CAD, ischemic cardiomyopathy,  remote bypass surgery, paroxysmal atrial fibrillation on Pradaxa, DVT, hypertension, history of V. tach status post biventricular ICD, hypothyroidism, hypertension, hyperlipidemia who came to the ED with generalized weakness.  Patient was seen in the ED last night after he fell while getting into his truck.  He sustained a proximal humerus fracture on the right and was placed in a shoulder immobilizer.  Patient was discharged home.  Patient did drink alcohol yesterday.  Denies taking any medications.  Patient today was feeling generally weak and was sitting in his chair he slipped out of the chair.  He fell on the ground could not get back up with arm injury.  There has been no change in neurological status, no numbness or change in speech.  No focal weakness of extremities except right upper arm in sling.  No fever or dysuria.  No chest pain or shortness of breath.In the ED patient was found to be in mild DKA and started on IV fluids.    Clinical Impression  Patient has most difficulty completing supine to sitting due to RUE in immobilizer and generalized weakness, once seated requires assistance to put on socks, slightly unsteady during sit to stands, transfers and ambulation in room/hallway without loss of balance, limited secondary to fatigue.  Patient tolerated sitting up in chair after therapy. Patient will benefit from continued physical therapy in hospital and recommended venue below to increase strength, balance, endurance for safe ADLs and gait.     Follow Up Recommendations SNF;Supervision for mobility/OOB;Supervision - Intermittent    Equipment Recommendations  3in1 (PT)    Recommendations for Other Services       Precautions / Restrictions  Precautions Precautions: Fall Restrictions Weight Bearing Restrictions: No      Mobility  Bed Mobility Overal bed mobility: Needs Assistance Bed Mobility: Supine to Sit     Supine to sit: Min assist;Mod assist     General bed mobility comments: slow labored movement with difficulty propping up on LUE due to weakness, RUE in immobilizer  Transfers Overall transfer level: Needs assistance Equipment used: Straight cane Transfers: Sit to/from Stand;Stand Pivot Transfers Sit to Stand: Min assist Stand pivot transfers: Min guard       General transfer comment: increased time, labored movement  Ambulation/Gait Ambulation/Gait assistance: Min assist;Min guard Gait Distance (Feet): 40 Feet Assistive device: Straight cane Gait Pattern/deviations: Decreased step length - right;Decreased step length - left;Decreased stride length;Step-to pattern Gait velocity: decreased   General Gait Details: slightly labored cadence with mostly 3 point gait pattern using SPC, no loss of balance, limited secondary to c/o fatigue  Stairs            Wheelchair Mobility    Modified Rankin (Stroke Patients Only)       Balance Overall balance assessment: Needs assistance Sitting-balance support: Feet supported;No upper extremity supported Sitting balance-Leahy Scale: Good Sitting balance - Comments: seated at EOB   Standing balance support: During functional activity;Single extremity supported Standing balance-Leahy Scale: Fair Standing balance comment: using SPC                             Pertinent Vitals/Pain Pain Assessment: Faces Faces Pain Scale: Hurts little more Pain Location: RUE with movement Pain Descriptors /  Indicators: Grimacing;Guarding;Sore Pain Intervention(s): Limited activity within patient's tolerance;Monitored during session;Repositioned    Home Living Family/patient expects to be discharged to:: Private residence Living Arrangements:  Alone Available Help at Discharge: Family;Available PRN/intermittently;Friend(s) Type of Home: Apartment Home Access: Stairs to enter Entrance Stairs-Rails: None Entrance Stairs-Number of Steps: 1 Home Layout: One level Home Equipment: Walker - 2 wheels;Cane - single point;Shower seat      Prior Function Level of Independence: Independent         Comments: Hydrographic surveyor, drives     Hand Dominance   Dominant Hand: Right    Extremity/Trunk Assessment   Upper Extremity Assessment Upper Extremity Assessment: Defer to OT evaluation    Lower Extremity Assessment Lower Extremity Assessment: Generalized weakness    Cervical / Trunk Assessment Cervical / Trunk Assessment: Normal  Communication   Communication: HOH  Cognition Arousal/Alertness: Awake/alert Behavior During Therapy: WFL for tasks assessed/performed Overall Cognitive Status: Within Functional Limits for tasks assessed                                        General Comments      Exercises     Assessment/Plan    PT Assessment Patient needs continued PT services  PT Problem List Decreased strength;Decreased activity tolerance;Decreased balance;Decreased mobility       PT Treatment Interventions Balance training;Gait training;Stair training;Functional mobility training;Therapeutic activities;Patient/family education;Therapeutic exercise    PT Goals (Current goals can be found in the Care Plan section)  Acute Rehab PT Goals Patient Stated Goal: return home with friends/family to assist PT Goal Formulation: With patient Time For Goal Achievement: 08/30/19 Potential to Achieve Goals: Good    Frequency Min 3X/week   Barriers to discharge        Co-evaluation               AM-PAC PT "6 Clicks" Mobility  Outcome Measure Help needed turning from your back to your side while in a flat bed without using bedrails?: A Lot Help needed moving from lying on your back to sitting  on the side of a flat bed without using bedrails?: A Lot Help needed moving to and from a bed to a chair (including a wheelchair)?: A Little Help needed standing up from a chair using your arms (e.g., wheelchair or bedside chair)?: A Little Help needed to walk in hospital room?: A Little Help needed climbing 3-5 steps with a railing? : A Lot 6 Click Score: 15    End of Session   Activity Tolerance: Patient tolerated treatment well;Patient limited by fatigue Patient left: in chair;with call bell/phone within reach Nurse Communication: Mobility status PT Visit Diagnosis: Unsteadiness on feet (R26.81);Other abnormalities of gait and mobility (R26.89);Muscle weakness (generalized) (M62.81)    Time: [id number] PT Time Calculation (min) (ACUTE ONLY): 33 min   Charges:   PT Evaluation $PT Eval Moderate Complexity: 1 Mod PT Treatments $Therapeutic Activity: 23-37 mins        2:00 PM, 08/16/19 Lonell Grandchild, MPT Physical Therapist with [facility] [phone] office [phone] mobile phone

## 2019-08-16 NOTE — Plan of Care (Signed)
  Problem: Acute Rehab PT Goals(only PT should resolve) Goal: Pt Will Go Supine/Side To Sit Outcome: Progressing Flowsheets (Taken 08/16/2019 1403) Pt will go Supine/Side to Sit: with min guard assist Goal: Patient Will Transfer Sit To/From Stand Outcome: Progressing Flowsheets (Taken 08/16/2019 1403) Patient will transfer sit to/from stand: with supervision Goal: Pt Will Transfer Bed To Chair/Chair To Bed Outcome: Progressing Flowsheets (Taken 08/16/2019 1403) Pt will Transfer Bed to Chair/Chair to Bed: with supervision Goal: Pt Will Ambulate Outcome: Progressing Flowsheets (Taken 08/16/2019 1403) Pt will Ambulate:  75 feet  with supervision  with cane   2:04 PM, 08/16/19 Lonell Grandchild, MPT Physical Therapist with [facility] [phone] office [phone] mobile phone

## 2019-08-16 NOTE — TOC Transition Note (Addendum)
Transition of Care [facility]) - CM/SW Discharge Note   Patient Details  Name: Alan Orr MRN: [id number] Date of Birth: 1945-06-20  Transition of [facility]) CM/SW Contact:  Alan Lucks, RN Phone Number: 08/16/2019, 2:18 PM   Clinical Narrative:  Patient admitted for fall. Lives at home alone in apartment. PT is recommending SNF unless patient has 3 to 4 hours aide during the day. Apartment does not allow overnight guest.  Patient is agreeable to SNF. Patient is admitted under OBS, Insurance will not approve SNF.  Patient has Medicaid. POA- Alan Orr states patient does not have any money and cannot sign over check to be admitted under medicaid.  Alan Orr is willing to call medicaid and seek Cap Aide hours.  He is agreeable to [facility] as well.  Choices reviewed, referred to Alan Orr with [facility] for PT/OT/RN/AIDE/SW  Addendum: MD ordering 3N1 and cane. Alan Orr with [employer] delivered to his room. Nephew in route to pick up patient.  Final next level of care: [place] Barriers to Discharge: No Barriers Identified   Patient Goals and CMS Choice Patient states their goals for this hospitalization and ongoing recovery are:: to go home. CMS Medicare.gov Compare Post Acute Care list provided to:: Patient Represenative (must comment) Choice offered to / list presented to : [place] / [place]  Discharge Placement        Discharge Plan and Services        HH Arranged: RN, PT, Nurse's Aide Alan Orr Agency: [place] Date [facility] Agency Contacted: 08/16/19 Time [place]: [id number] Representative spoke with at [place]: Alan Orr

## 2019-08-17 ENCOUNTER — Emergency Department (HOSPITAL_COMMUNITY): Payer: Medicare Other

## 2019-08-17 ENCOUNTER — Emergency Department (HOSPITAL_COMMUNITY)
Admission: EM | Admit: 2019-08-17 | Discharge: 2019-08-18 | Disposition: A | Discharge status: Home / Self Care | Payer: Medicare Other | Source: Home / Self Care | Attending: Emergency Medicine | Admitting: Emergency Medicine

## 2019-08-17 ENCOUNTER — Encounter (HOSPITAL_COMMUNITY): Payer: Self-pay

## 2019-08-17 DIAGNOSIS — I129 Hypertensive chronic kidney disease with stage 1 through stage 4 chronic kidney disease, or unspecified chronic kidney disease: Secondary | ICD-10-CM | POA: Insufficient documentation

## 2019-08-17 DIAGNOSIS — Z95810 Presence of automatic (implantable) cardiac defibrillator: Secondary | ICD-10-CM | POA: Insufficient documentation

## 2019-08-17 DIAGNOSIS — Z79899 Other long term (current) drug therapy: Secondary | ICD-10-CM | POA: Insufficient documentation

## 2019-08-17 DIAGNOSIS — N183 Chronic kidney disease, stage 3 unspecified: Secondary | ICD-10-CM | POA: Insufficient documentation

## 2019-08-17 DIAGNOSIS — W07XXXA Fall from chair, initial encounter: Secondary | ICD-10-CM | POA: Insufficient documentation

## 2019-08-17 DIAGNOSIS — Z7982 Long term (current) use of aspirin: Secondary | ICD-10-CM | POA: Insufficient documentation

## 2019-08-17 DIAGNOSIS — I2510 Atherosclerotic heart disease of native coronary artery without angina pectoris: Secondary | ICD-10-CM | POA: Insufficient documentation

## 2019-08-17 DIAGNOSIS — Y92009 Unspecified place in unspecified non-institutional (private) residence as the place of occurrence of the external cause: Secondary | ICD-10-CM

## 2019-08-17 DIAGNOSIS — E1122 Type 2 diabetes mellitus with diabetic chronic kidney disease: Secondary | ICD-10-CM | POA: Insufficient documentation

## 2019-08-17 DIAGNOSIS — Z951 Presence of aortocoronary bypass graft: Secondary | ICD-10-CM | POA: Insufficient documentation

## 2019-08-17 DIAGNOSIS — M25521 Pain in right elbow: Secondary | ICD-10-CM | POA: Insufficient documentation

## 2019-08-17 LAB — CBC WITH DIFFERENTIAL/PLATELET
Abs Immature Granulocytes: 0.04 10*3/uL (ref 0.00–0.07)
Basophils Absolute: 0 10*3/uL (ref 0.0–0.1)
Basophils Relative: 0 %
Eosinophils Absolute: 0 10*3/uL (ref 0.0–0.5)
Eosinophils Relative: 0 %
HCT: 31.8 % — ABNORMAL LOW (ref 39.0–52.0)
Hemoglobin: 10.4 g/dL — ABNORMAL LOW (ref 13.0–17.0)
Immature Granulocytes: 0 %
Lymphocytes Relative: 15 %
Lymphs Abs: 1.8 10*3/uL (ref 0.7–4.0)
MCH: 35.5 pg — ABNORMAL HIGH (ref 26.0–34.0)
MCHC: 32.7 g/dL (ref 30.0–36.0)
MCV: 108.5 fL — ABNORMAL HIGH (ref 80.0–100.0)
Monocytes Absolute: 1 10*3/uL (ref 0.1–1.0)
Monocytes Relative: 8 %
Neutro Abs: 9 10*3/uL — ABNORMAL HIGH (ref 1.7–7.7)
Neutrophils Relative %: 77 %
Platelets: 182 10*3/uL (ref 150–400)
RBC: 2.93 MIL/uL — ABNORMAL LOW (ref 4.22–5.81)
RDW: 14.4 % (ref 11.5–15.5)
WBC: 11.8 10*3/uL — ABNORMAL HIGH (ref 4.0–10.5)
nRBC: 0 % (ref 0.0–0.2)

## 2019-08-17 LAB — BASIC METABOLIC PANEL
Anion gap: 10 (ref 5–15)
BUN: 30 mg/dL — ABNORMAL HIGH (ref 8–23)
CO2: 23 mmol/L (ref 22–32)
Calcium: 8.4 mg/dL — ABNORMAL LOW (ref 8.9–10.3)
Chloride: 104 mmol/L (ref 98–111)
Creatinine, Ser: 1.59 mg/dL — ABNORMAL HIGH (ref 0.61–1.24)
GFR calc Af Amer: 49 mL/min — ABNORMAL LOW (ref >60)
GFR calc non Af Amer: 42 mL/min — ABNORMAL LOW (ref >60)
Glucose, Bld: 153 mg/dL — ABNORMAL HIGH (ref 70–99)
Potassium: 3.8 mmol/L (ref 3.5–5.1)
Sodium: 137 mmol/L (ref 135–145)

## 2019-08-17 LAB — TROPONIN I (HIGH SENSITIVITY)
Troponin I (High Sensitivity): 19 ng/L — ABNORMAL HIGH (ref <18)
Troponin I (High Sensitivity): 20 ng/L — ABNORMAL HIGH (ref <18)

## 2019-08-17 LAB — CK: Total CK: 190 U/L (ref 49–397)

## 2019-08-17 NOTE — ED Triage Notes (Signed)
Pt fell today and was on the floor for 3 hours. He is complaining of right arm pain, which was injured a few days ago in a fall as well. Pt is complaining of right arm pain. Sling applied.

## 2019-08-17 NOTE — Discharge Instructions (Addendum)
You have a broken bone in your upper right arm.  Your x-ray of your elbow this evening did not show a broken bone.  You need to use a four-legged cane to help steady you when walking or standing.  You may purchase these at [employer].  Be sure to follow-up with Dr. Aline Brochure regarding your broken upper arm.

## 2019-08-17 NOTE — ED Provider Notes (Signed)
[facility] EMERGENCY DEPARTMENT Provider Note   CSN: [id number] Arrival date & time: 08/17/19  1711     History Chief Complaint  Patient presents with  . Bascom is a 74 y.o. male.  HPI      AYOUB DOCKETT is a 74 y.o. male with past medical history of type 2 diabetes, coronary artery disease, hypertension, hyperlipidemia, paroxysmal atrial fib with plan at cardiac defibrillator.  he presents to the Emergency Department for evaluation of a fall.  He was seen here on 08/15/2019 after a fall while getting into his pickup truck and found to have a proximal humerus fracture.  He was placed in a shoulder immobilizer.  He was discharged home and later returned for ER evaluation after a second fall in which he slipped out of a chair landing on the floor and was unable to get up.  Patient states that he does live alone, but typically has someone to help care for him when needed.  He was admitted on his second visit for dehydration and discharged home with home health referral, but patient states home health has not came to his home.  Today, patient admits to a third fall while home alone and laid in the floor for approximately 3 hours because he was unable to get up because he was unable to use his right arm.  He also complains of pain to his right elbow.  Patient does admit to taking oxycodone and Xanax for his anxiety.  He also admits to occasional alcohol use.  He denies chest pain, shortness of breath, numbness, head injury, or LOC.  No headache or dizziness.  No nausea or vomiting.   Past Medical History:  Diagnosis Date  . CAD (coronary artery disease)   . DM (diabetes mellitus) ([place])    type 2   . DVT (deep venous thrombosis) ([place])   . Hyperlipidemia   . Hypertension   . ICD (implantable cardioverter-defibrillator), single, in situ   . Ischemic cardiomyopathy   . PAF (paroxysmal atrial fibrillation) ([place])   . RBBB   . S/P CABG x 5     Patient Active Problem List    Diagnosis Date Noted  . AKI (acute kidney injury) ([place]) 08/15/2019  . Cellulitis of right lower extremity 06/21/2018  . Salmonella sepsis ([place]) 10/21/2016  . Salmonella gastroenteritis 10/20/2016  . Transaminasemia 10/18/2016  . Pancytopenia ([place]) 10/18/2016  . DM type 2 (diabetes mellitus, type 2) ([place]) 10/18/2016  . Thrombocytopenia ([place]) 08/22/2016  . Tick bite of right thigh 08/22/2016  . Abnormal serum protein electrophoresis 08/21/2014  . Leg wound, left 11/03/2013  . History of carotid artery disease 03/03/2013  . Cardiomyopathy, ischemic 11/08/2012  . CAD s/p CABG  11/08/2012  . Hyperlipidemia 11/08/2012  . Paroxysmal atrial fibrillation ([place]) 11/08/2012  . Ventricular tachycardia ([place]) 11/08/2012  . Type 2 diabetes mellitus with stage 3 chronic kidney disease ([place]) 08/20/2009  . PACEMAKER, PERMANENT 08/20/2009  . Biventricular ICD (implantable cardioverter-defibrillator) in place 08/20/2009    Past Surgical History:  Procedure Laterality Date  . BACK SURGERY    . CARDIAC CATHETERIZATION  05/08/2009   small vessel disease  . CARDIAC DEFIBRILLATOR PLACEMENT  05/09/2009   [employer]  . CORONARY ARTERY BYPASS GRAFT  12/31/1995   LIMA to LAD,SVG to intermediate,SVG to CX,seq. svg to posterior descending and posterolateral RCA  . EP IMPLANTABLE DEVICE N/A 09/25/2015   Procedure: PPM/BIV PPM Generator Changeout;  Surgeon: Evans Lance, MD;  Location: [facility];  Service: Cardiovascular;  Laterality: N/A;  . I & D EXTREMITY Left 11/03/2013   Procedure: Left Leg Debride Ulcer, Apply Wound VAC and [place];  Surgeon: Newt Minion, MD;  Location: [facility];  Service: Orthopedics;  Laterality: Left;  . TEE WITHOUT CARDIOVERSION N/A 01/11/2015   Procedure: TRANSESOPHAGEAL ECHOCARDIOGRAM (TEE);  Surgeon: Herminio Commons, MD;  Location: [facility];  Service: Cardiology;  Laterality: N/A;       Family History  Problem Relation Age of Onset  . Heart attack Mother     . Stroke Father     Social History   Tobacco Use  . Smoking status: Former Smoker    Packs/day: 1.50    Years: 35.00    Pack years: 52.50    Types: Cigarettes    Quit date: 11/03/1995    Years since quitting: 23.8  . Smokeless tobacco: Never Used  Substance Use Topics  . Alcohol use: Yes    Comment: occasionally  . Drug use: No    Home Medications Prior to Admission medications   Medication Sig Start Date End Date Taking? Authorizing Provider  ALPRAZolam Duanne Moron) 1 MG tablet Take 1 tablet (1 mg total) by mouth daily as needed for anxiety or sleep. Patient taking differently: Take 1 mg by mouth 4 (four) times daily as needed for anxiety or sleep.  10/22/16  Yes Kathie Dike, MD  amiodarone (PACERONE) 200 MG tablet Take 200 mg by mouth daily.   Yes [provider]  aspirin EC 81 MG tablet Take 81 mg by mouth at bedtime.   Yes [provider]  atorvastatin (LIPITOR) 40 MG tablet Take 40 mg by mouth at bedtime.   Yes [provider]  ezetimibe (ZETIA) 10 MG tablet TAKE 1 TABLET ONCE DAILY FOR CHOLESTEROL. Patient taking differently: Take 10 mg by mouth daily.  01/04/17  Yes Lorretta Harp, MD  ferrous sulfate 325 (65 FE) MG tablet Take 325 mg by mouth daily with breakfast.   Yes [provider]  magnesium oxide (MAG-OX) 400 MG tablet Take 400 mg by mouth daily.   Yes [provider]  metoprolol tartrate (LOPRESSOR) 50 MG tablet TAKE 1 TABLET BY MOUTH TWICE DAILY. Patient taking differently: Take 50 mg by mouth 2 (two) times daily.  10/10/18  Yes Evans Lance, MD  Multiple Vitamin (MULTI-VITAMINS) TABS Take 1 tablet by mouth daily.   Yes [provider]  niacin (NIASPAN) 1000 MG CR tablet TAKE (1) TABLET BY MOUTH AT BEDTIME. Patient taking differently: Take 1,000 mg by mouth at bedtime.  10/15/16  Yes Bayard Hugger, MD  nitroGLYCERIN (NITROSTAT) 0.4 MG SL tablet Place 0.4 mg under the tongue every 5 (five) minutes x 3 doses as  needed for chest pain. 10/31/15  Yes [provider]  Oxycodone HCl 20 MG TABS Take 1 tablet by mouth 4 (four) times daily as needed (for pain).  07/23/19  Yes [provider]  SYNTHROID 137 MCG tablet Take 137 mcg by mouth daily before breakfast.  07/20/16  Yes [provider]  dabigatran (PRADAXA) 75 MG CAPS capsule Take 1 capsule (75 mg total) by mouth every 12 (twelve) hours. 08/16/19   Danford, Suann Larry, MD    Allergies    Patient has no known allergies.  Review of Systems   Review of Systems  Constitutional: Negative for chills and fever.  Respiratory: Negative for shortness of breath.   Cardiovascular: Negative for chest pain.  Gastrointestinal: Negative for abdominal pain, nausea and vomiting.  Genitourinary: Negative for difficulty urinating and dysuria.  Musculoskeletal: Positive for arthralgias (Right shoulder and elbow pain). Negative for joint swelling and neck pain.  Skin: Positive for color change (Bruising right forearm and upper arm.). Negative for wound.  Neurological: Negative for dizziness, syncope, weakness and headaches.  Psychiatric/Behavioral: Negative for confusion.    Physical Exam Updated Vital Signs BP (!) 125/58   Pulse 65   Temp 99.1 F (37.3 C) (Oral)   Resp (!) 21   Ht 6\' 2"  (1.88 m)   Wt 104.3 kg   SpO2 93%   BMI 29.53 kg/m   Physical Exam Vitals and nursing note reviewed.  Constitutional:      Appearance: Normal appearance. He is not ill-appearing.  HENT:     Head: Atraumatic.     Mouth/Throat:     Mouth: Mucous membranes are moist.  Eyes:     Extraocular Movements: Extraocular movements intact.     Pupils: Pupils are equal, round, and reactive to light.  Cardiovascular:     Rate and Rhythm: Normal rate and regular rhythm.     Pulses: Normal pulses.  Pulmonary:     Effort: Pulmonary effort is normal.     Breath sounds: Normal breath sounds.  Chest:     Chest wall: No tenderness.  Abdominal:     General:  There is no distension.     Palpations: Abdomen is soft.     Tenderness: There is no abdominal tenderness.  Musculoskeletal:        General: Swelling, tenderness and signs of injury present.     Cervical back: Normal range of motion. No tenderness.     Comments: Patient with significant bruising of the right arm extending from the dorsal hand up to the right shoulder.  Mild to moderate edema noted of the right elbow.  Pain to palpation of the posterior elbow and anterior right shoulder.  Patient has equal grip strength bilaterally.  Right wrist nontender.  Skin:    General: Skin is warm.     Capillary Refill: Capillary refill takes less than 2 seconds.  Neurological:     General: No focal deficit present.     Mental Status: He is alert.     Sensory: Sensation is intact.     Motor: Motor function is intact.     Coordination: Coordination is intact.     Comments: CN II-XII grossly intact.  Speech clear.       ED Results / Procedures / Treatments   Labs (all labs ordered are listed, but only abnormal results are displayed) Labs Reviewed  BASIC METABOLIC PANEL - Abnormal; Notable for the following components:      Result Value   Glucose, Bld 153 (*)    BUN 30 (*)    Creatinine, Ser 1.59 (*)    Calcium 8.4 (*)    GFR calc non Af Amer 42 (*)    GFR calc Af Amer 49 (*)    All other components within normal limits  CBC WITH DIFFERENTIAL/PLATELET - Abnormal; Notable for the following components:   WBC 11.8 (*)    RBC 2.93 (*)    Hemoglobin 10.4 (*)    HCT 31.8 (*)    MCV 108.5 (*)    MCH 35.5 (*)    Neutro Abs 9.0 (*)    All other components within normal limits  TROPONIN I (HIGH SENSITIVITY) - Abnormal; Notable for the following components:  Troponin I (High Sensitivity) 19 (*)    All other components within normal limits  CK  TROPONIN I (HIGH SENSITIVITY)    EKG None    Date: 08/17/2019  Rate: 64  Rhythm: normal sinus rhythm  QRS Axis:   Intervals: prolong PR  ST/T  Wave abnormalities: normal  Conduction Disutrbances:   Narrative Interpretation: Normal sinus rhythm with prolonged PR interval, right bundle branch block and LPFB  Reviewed by EKG      Radiology DG Elbow Complete Right  Result Date: 08/17/2019 CLINICAL DATA:  Recent fall with proximal humeral fracture, initial encounter EXAM: RIGHT ELBOW - COMPLETE 3+ VIEW COMPARISON:  10/04/2017 FINDINGS: Degenerative changes are noted about the elbow joint. A joint effusion is seen although no acute fracture is noted. There is some remodeling of the radial head noted which appears chronic in nature similar to that noted on the prior exam. IMPRESSION: Chronic degenerative changes with remodeling of the radial head. Joint effusion is noted but stable from the prior exam from 2 years ago. Electronically Signed   By: Inez Catalina M.D.   On: 08/17/2019 19:48    Procedures Procedures (including critical care time)  Medications Ordered in ED Medications - No data to display  ED Course  I have reviewed the triage vital signs and the nursing notes.  Pertinent labs & imaging results that were available during my care of the patient were reviewed by me and considered in my medical decision making (see chart for details).    MDM Rules/Calculators/A&P                      Patient with multiple ER visits secondary to falls.  Seen here on 08/15/2019 and found to have a fracture of the proximal humerus.  He was placed a shoulder immobilizer and discharged home.  He returned later that day for evaluation after a second fall and admitted for dehydration.  Of note, patient was discharged with order for home health that has not had his initial evaluation.  He returns today after third fall with pain of his right elbow.  No LOC or reported head injury.  Total CK, EKG, and troponin unremarkable.  On recheck, patient upset about wait time and requesting discharge home.  X-ray of the elbow is without acute bony injury.   Patient already wearing shoulder immobilizer.  Friend at bedside aware that patient is supposed to have home health eval.  I feel that he is appropriate for discharge home.  He will follow-up with local orthopedics regarding his humerus fracture.  Return precautions discussed.   Final Clinical Impression(s) / ED Diagnoses Final diagnoses:  Fall in home, initial encounter    Rx / DC Orders ED Discharge Orders    None       Kem Parkinson, Hershal Coria 08/18/19 [id number]    Milton Ferguson, MD 08/19/19 1520

## 2019-08-17 NOTE — ED Notes (Signed)
Per nephew, pt has already had Ramipril today

## 2019-08-18 ENCOUNTER — Emergency Department (HOSPITAL_COMMUNITY): Payer: Medicare Other

## 2019-08-18 ENCOUNTER — Inpatient Hospital Stay (HOSPITAL_COMMUNITY)
Admission: EM | Admit: 2019-08-18 | Discharge: 2019-08-22 | DRG: 948 | Disposition: A | Discharge status: Skilled Nursing Facility | Payer: Medicare Other | Attending: Internal Medicine | Admitting: Internal Medicine

## 2019-08-18 DIAGNOSIS — R296 Repeated falls: Secondary | ICD-10-CM | POA: Diagnosis present

## 2019-08-18 DIAGNOSIS — W19XXXA Unspecified fall, initial encounter: Secondary | ICD-10-CM

## 2019-08-18 DIAGNOSIS — N1831 Chronic kidney disease, stage 3a: Secondary | ICD-10-CM | POA: Diagnosis present

## 2019-08-18 DIAGNOSIS — Z79891 Long term (current) use of opiate analgesic: Secondary | ICD-10-CM

## 2019-08-18 DIAGNOSIS — Z79899 Other long term (current) drug therapy: Secondary | ICD-10-CM

## 2019-08-18 DIAGNOSIS — Z86718 Personal history of other venous thrombosis and embolism: Secondary | ICD-10-CM

## 2019-08-18 DIAGNOSIS — Z951 Presence of aortocoronary bypass graft: Secondary | ICD-10-CM

## 2019-08-18 DIAGNOSIS — S42201A Unspecified fracture of upper end of right humerus, initial encounter for closed fracture: Secondary | ICD-10-CM | POA: Diagnosis present

## 2019-08-18 DIAGNOSIS — Z789 Other specified health status: Secondary | ICD-10-CM | POA: Diagnosis present

## 2019-08-18 DIAGNOSIS — E1151 Type 2 diabetes mellitus with diabetic peripheral angiopathy without gangrene: Secondary | ICD-10-CM | POA: Diagnosis present

## 2019-08-18 DIAGNOSIS — I129 Hypertensive chronic kidney disease with stage 1 through stage 4 chronic kidney disease, or unspecified chronic kidney disease: Secondary | ICD-10-CM | POA: Diagnosis present

## 2019-08-18 DIAGNOSIS — N179 Acute kidney failure, unspecified: Secondary | ICD-10-CM | POA: Diagnosis present

## 2019-08-18 DIAGNOSIS — G894 Chronic pain syndrome: Secondary | ICD-10-CM | POA: Diagnosis present

## 2019-08-18 DIAGNOSIS — W1830XA Fall on same level, unspecified, initial encounter: Secondary | ICD-10-CM | POA: Diagnosis present

## 2019-08-18 DIAGNOSIS — F419 Anxiety disorder, unspecified: Secondary | ICD-10-CM | POA: Diagnosis present

## 2019-08-18 DIAGNOSIS — Z950 Presence of cardiac pacemaker: Secondary | ICD-10-CM

## 2019-08-18 DIAGNOSIS — E785 Hyperlipidemia, unspecified: Secondary | ICD-10-CM | POA: Diagnosis present

## 2019-08-18 DIAGNOSIS — E1122 Type 2 diabetes mellitus with diabetic chronic kidney disease: Secondary | ICD-10-CM | POA: Diagnosis present

## 2019-08-18 DIAGNOSIS — Y92009 Unspecified place in unspecified non-institutional (private) residence as the place of occurrence of the external cause: Secondary | ICD-10-CM

## 2019-08-18 DIAGNOSIS — E039 Hypothyroidism, unspecified: Secondary | ICD-10-CM | POA: Diagnosis present

## 2019-08-18 DIAGNOSIS — Z7982 Long term (current) use of aspirin: Secondary | ICD-10-CM

## 2019-08-18 DIAGNOSIS — I4510 Unspecified right bundle-branch block: Secondary | ICD-10-CM | POA: Diagnosis present

## 2019-08-18 DIAGNOSIS — R197 Diarrhea, unspecified: Secondary | ICD-10-CM | POA: Diagnosis not present

## 2019-08-18 DIAGNOSIS — Z79890 Hormone replacement therapy (postmenopausal): Secondary | ICD-10-CM

## 2019-08-18 DIAGNOSIS — E860 Dehydration: Secondary | ICD-10-CM | POA: Diagnosis present

## 2019-08-18 DIAGNOSIS — Z87891 Personal history of nicotine dependence: Secondary | ICD-10-CM

## 2019-08-18 DIAGNOSIS — Z20822 Contact with and (suspected) exposure to covid-19: Secondary | ICD-10-CM | POA: Diagnosis present

## 2019-08-18 DIAGNOSIS — I2510 Atherosclerotic heart disease of native coronary artery without angina pectoris: Secondary | ICD-10-CM | POA: Diagnosis present

## 2019-08-18 DIAGNOSIS — I480 Paroxysmal atrial fibrillation: Secondary | ICD-10-CM | POA: Diagnosis present

## 2019-08-18 DIAGNOSIS — I255 Ischemic cardiomyopathy: Secondary | ICD-10-CM | POA: Diagnosis present

## 2019-08-18 DIAGNOSIS — N183 Chronic kidney disease, stage 3 unspecified: Secondary | ICD-10-CM | POA: Diagnosis present

## 2019-08-18 DIAGNOSIS — R531 Weakness: Principal | ICD-10-CM | POA: Diagnosis present

## 2019-08-18 DIAGNOSIS — I472 Ventricular tachycardia: Secondary | ICD-10-CM | POA: Diagnosis not present

## 2019-08-18 DIAGNOSIS — Z7901 Long term (current) use of anticoagulants: Secondary | ICD-10-CM

## 2019-08-18 DIAGNOSIS — H9190 Unspecified hearing loss, unspecified ear: Secondary | ICD-10-CM | POA: Diagnosis present

## 2019-08-18 LAB — COMPREHENSIVE METABOLIC PANEL
ALT: 48 U/L — ABNORMAL HIGH (ref 0–44)
AST: 52 U/L — ABNORMAL HIGH (ref 15–41)
Albumin: 2.8 g/dL — ABNORMAL LOW (ref 3.5–5.0)
Alkaline Phosphatase: 64 U/L (ref 38–126)
Anion gap: 13 (ref 5–15)
BUN: 23 mg/dL (ref 8–23)
CO2: 24 mmol/L (ref 22–32)
Calcium: 8.6 mg/dL — ABNORMAL LOW (ref 8.9–10.3)
Chloride: 103 mmol/L (ref 98–111)
Creatinine, Ser: 1.32 mg/dL — ABNORMAL HIGH (ref 0.61–1.24)
GFR calc Af Amer: >60 mL/min (ref >60)
GFR calc non Af Amer: 53 mL/min — ABNORMAL LOW (ref >60)
Glucose, Bld: 126 mg/dL — ABNORMAL HIGH (ref 70–99)
Potassium: 3.9 mmol/L (ref 3.5–5.1)
Sodium: 140 mmol/L (ref 135–145)
Total Bilirubin: 1.7 mg/dL — ABNORMAL HIGH (ref 0.3–1.2)
Total Protein: 6.3 g/dL — ABNORMAL LOW (ref 6.5–8.1)

## 2019-08-18 LAB — SARS CORONAVIRUS 2 BY RT PCR (HOSPITAL ORDER, PERFORMED IN ~~LOC~~ HOSPITAL LAB): SARS Coronavirus 2: NEGATIVE

## 2019-08-18 LAB — CBC
HCT: 33 % — ABNORMAL LOW (ref 39.0–52.0)
Hemoglobin: 10.8 g/dL — ABNORMAL LOW (ref 13.0–17.0)
MCH: 35.8 pg — ABNORMAL HIGH (ref 26.0–34.0)
MCHC: 32.7 g/dL (ref 30.0–36.0)
MCV: 109.3 fL — ABNORMAL HIGH (ref 80.0–100.0)
Platelets: 197 10*3/uL (ref 150–400)
RBC: 3.02 MIL/uL — ABNORMAL LOW (ref 4.22–5.81)
RDW: 14.4 % (ref 11.5–15.5)
WBC: 10.5 10*3/uL (ref 4.0–10.5)
nRBC: 0 % (ref 0.0–0.2)

## 2019-08-18 LAB — CK: Total CK: 281 U/L (ref 49–397)

## 2019-08-18 LAB — GLUCOSE, CAPILLARY: Glucose-Capillary: 89 mg/dL (ref 70–99)

## 2019-08-18 LAB — ETHANOL: Alcohol, Ethyl (B): <10 mg/dL (ref <10)

## 2019-08-18 MED ORDER — THIAMINE HCL 100 MG/ML IJ SOLN
Freq: Once | INTRAVENOUS | Status: AC
  Filled 2019-08-18: qty 1000

## 2019-08-18 MED ORDER — HEPARIN SODIUM (PORCINE) 5000 UNIT/ML IJ SOLN
5000.0000 [IU] | Freq: Three times a day (TID) | INTRAMUSCULAR | Status: DC
  Administered 2019-08-18 – 2019-08-19 (×2): 5000 [IU] via SUBCUTANEOUS
  Filled 2019-08-18 (×2): qty 1

## 2019-08-18 MED ORDER — INSULIN ASPART 100 UNIT/ML ~~LOC~~ SOLN
0.0000 [IU] | Freq: Three times a day (TID) | SUBCUTANEOUS | Status: DC
  Administered 2019-08-19 – 2019-08-20 (×2): 1 [IU] via SUBCUTANEOUS

## 2019-08-18 MED ORDER — ONDANSETRON HCL 4 MG/2ML IJ SOLN: 4.0000 mg | Freq: Four times a day (QID) | INTRAMUSCULAR | Status: DC | PRN

## 2019-08-18 MED ORDER — ONDANSETRON HCL 4 MG PO TABS: 4.0000 mg | ORAL_TABLET | Freq: Four times a day (QID) | ORAL | Status: DC | PRN

## 2019-08-18 MED ORDER — ACETAMINOPHEN 650 MG RE SUPP: 650.0000 mg | Freq: Four times a day (QID) | RECTAL | Status: DC | PRN

## 2019-08-18 MED ORDER — INSULIN ASPART 100 UNIT/ML ~~LOC~~ SOLN: 0.0000 [IU] | Freq: Every day | SUBCUTANEOUS | Status: DC

## 2019-08-18 MED ORDER — ACETAMINOPHEN 325 MG PO TABS: 650.0000 mg | ORAL_TABLET | Freq: Four times a day (QID) | ORAL | Status: DC | PRN

## 2019-08-18 MED ORDER — SODIUM CHLORIDE 0.9 % IV SOLN: INTRAVENOUS | Status: DC

## 2019-08-18 MED ORDER — MORPHINE SULFATE (PF) 2 MG/ML IV SOLN
2.0000 mg | INTRAVENOUS | Status: DC | PRN
  Administered 2019-08-18 – 2019-08-19 (×2): 2 mg via INTRAVENOUS
  Filled 2019-08-18 (×2): qty 1

## 2019-08-18 NOTE — ED Triage Notes (Signed)
Pt here for recurrent falls. Left yesterday AMA. He has extensive bruising to right arm, which is worse today. Pt was found down. Endorses right arm pain.

## 2019-08-18 NOTE — ED Provider Notes (Signed)
[facility] EMERGENCY DEPARTMENT Provider Note   CSN: [id number] Arrival date & time: 08/18/19  1110     History Chief Complaint  Patient presents with  . Alan Orr is a 74 y.o. male.  Patient presents via EMS after being found on floor by family - pt unclear as to how he ended up on floor, pt denies fall. EMS reports pt on floor ~ 3 hours, and unable to get up on own. Recently, several falls, w fall a few days ago resulting in proximal humerus fx. Pt unsure what is causing falls. Pt is noted to be on chronic pain meds/sedating meds. Pt denies overuse or abuse of meds. Denies etoh use. Pt does appear generally shaky. Denies headache. No neck or back pain. No chest pain or sob. No abd pain or nvd. No gu c/o. Pt is difficult/limited historian.   The history is provided by the patient and the EMS personnel. The history is limited by the condition of the patient.  Fall Pertinent negatives include no chest pain, no abdominal pain, no headaches and no shortness of breath.       Past Medical History:  Diagnosis Date  . CAD (coronary artery disease)   . DM (diabetes mellitus) ([place])    type 2   . DVT (deep venous thrombosis) ([place])   . Hyperlipidemia   . Hypertension   . ICD (implantable cardioverter-defibrillator), single, in situ   . Ischemic cardiomyopathy   . PAF (paroxysmal atrial fibrillation) ([place])   . RBBB   . S/P CABG x 5     Patient Active Problem List   Diagnosis Date Noted  . AKI (acute kidney injury) ([place]) 08/15/2019  . Cellulitis of right lower extremity 06/21/2018  . Salmonella sepsis ([place]) 10/21/2016  . Salmonella gastroenteritis 10/20/2016  . Transaminasemia 10/18/2016  . Pancytopenia ([place]) 10/18/2016  . DM type 2 (diabetes mellitus, type 2) ([place]) 10/18/2016  . Thrombocytopenia ([place]) 08/22/2016  . Tick bite of right thigh 08/22/2016  . Abnormal serum protein electrophoresis 08/21/2014  . Leg wound, left 11/03/2013  . History of carotid artery  disease 03/03/2013  . Cardiomyopathy, ischemic 11/08/2012  . CAD s/p CABG  11/08/2012  . Hyperlipidemia 11/08/2012  . Paroxysmal atrial fibrillation ([place]) 11/08/2012  . Ventricular tachycardia ([place]) 11/08/2012  . Type 2 diabetes mellitus with stage 3 chronic kidney disease ([place]) 08/20/2009  . PACEMAKER, PERMANENT 08/20/2009  . Biventricular ICD (implantable cardioverter-defibrillator) in place 08/20/2009    Past Surgical History:  Procedure Laterality Date  . BACK SURGERY    . CARDIAC CATHETERIZATION  05/08/2009   small vessel disease  . CARDIAC DEFIBRILLATOR PLACEMENT  05/09/2009   [employer]  . CORONARY ARTERY BYPASS GRAFT  12/31/1995   LIMA to LAD,SVG to intermediate,SVG to CX,seq. svg to posterior descending and posterolateral RCA  . EP IMPLANTABLE DEVICE N/A 09/25/2015   Procedure: PPM/BIV PPM Generator Changeout;  Surgeon: Evans Lance, MD;  Location: [facility];  Service: Cardiovascular;  Laterality: N/A;  . I & D EXTREMITY Left 11/03/2013   Procedure: Left Leg Debride Ulcer, Apply Wound VAC and [place];  Surgeon: Newt Minion, MD;  Location: [facility];  Service: Orthopedics;  Laterality: Left;  . TEE WITHOUT CARDIOVERSION N/A 01/11/2015   Procedure: TRANSESOPHAGEAL ECHOCARDIOGRAM (TEE);  Surgeon: Herminio Commons, MD;  Location[facility];  Service: Cardiology;  Laterality: N/A;       Family History  Problem Relation Age of Onset  . Heart  attack Mother   . Stroke Father     Social History   Tobacco Use  . Smoking status: Former Smoker    Packs/day: 1.50    Years: 35.00    Pack years: 52.50    Types: Cigarettes    Quit date: 11/03/1995    Years since quitting: 23.8  . Smokeless tobacco: Never Used  Substance Use Topics  . Alcohol use: Yes    Comment: occasionally  . Drug use: No    Home Medications Prior to Admission medications   Medication Sig Start Date End Date Taking? Authorizing Provider  ALPRAZolam Duanne Moron) 1 MG tablet Take 1 tablet (1 mg  total) by mouth daily as needed for anxiety or sleep. Patient taking differently: Take 1 mg by mouth 4 (four) times daily as needed for anxiety or sleep.  10/22/16   Kathie Dike, MD  amiodarone (PACERONE) 200 MG tablet Take 200 mg by mouth daily.    [provider]  aspirin EC 81 MG tablet Take 81 mg by mouth at bedtime.    [provider]  atorvastatin (LIPITOR) 40 MG tablet Take 40 mg by mouth at bedtime.    [provider]  dabigatran (PRADAXA) 75 MG CAPS capsule Take 1 capsule (75 mg total) by mouth every 12 (twelve) hours. 08/16/19   Danford, Suann Larry, MD  ezetimibe (ZETIA) 10 MG tablet TAKE 1 TABLET ONCE DAILY FOR CHOLESTEROL. Patient taking differently: Take 10 mg by mouth daily.  01/04/17   Lorretta Harp, MD  ferrous sulfate 325 (65 FE) MG tablet Take 325 mg by mouth daily with breakfast.    [provider]  magnesium oxide (MAG-OX) 400 MG tablet Take 400 mg by mouth daily.    [provider]  metoprolol tartrate (LOPRESSOR) 50 MG tablet TAKE 1 TABLET BY MOUTH TWICE DAILY. Patient taking differently: Take 50 mg by mouth 2 (two) times daily.  10/10/18   Evans Lance, MD  Multiple Vitamin (MULTI-VITAMINS) TABS Take 1 tablet by mouth daily.    [provider]  niacin (NIASPAN) 1000 MG CR tablet TAKE (1) TABLET BY MOUTH AT BEDTIME. Patient taking differently: Take 1,000 mg by mouth at bedtime.  10/15/16   Bayard Hugger, MD  nitroGLYCERIN (NITROSTAT) 0.4 MG SL tablet Place 0.4 mg under the tongue every 5 (five) minutes x 3 doses as needed for chest pain. 10/31/15   [provider]  Oxycodone HCl 20 MG TABS Take 1 tablet by mouth 4 (four) times daily as needed (for pain).  07/23/19   [provider]  SYNTHROID 137 MCG tablet Take 137 mcg by mouth daily before breakfast.  07/20/16   [provider]    Allergies    Patient has no known allergies.  Review of Systems   Review of Systems  Constitutional:  Negative for fever.  HENT: Negative for sore throat.   Eyes: Negative for pain and visual disturbance.  Respiratory: Negative for cough and shortness of breath.   Cardiovascular: Negative for chest pain.  Gastrointestinal: Negative for abdominal pain, diarrhea and vomiting.  Endocrine: Negative for polyuria.  Genitourinary: Negative for dysuria and flank pain.  Musculoskeletal: Negative for back pain and neck pain.  Skin: Negative for rash.  Neurological: Negative for headaches.  Hematological:       +anticoag use.   Psychiatric/Behavioral: Negative for confusion.    Physical Exam Updated Vital Signs There were no vitals taken for this visit.  Physical Exam Vitals and nursing note  reviewed.  Constitutional:      Appearance: Normal appearance. He is well-developed.  HENT:     Head: Atraumatic.     Nose: Nose normal.     Mouth/Throat:     Mouth: Mucous membranes are moist.     Pharynx: Oropharynx is clear.  Eyes:     General: No scleral icterus.    Conjunctiva/sclera: Conjunctivae normal.     Pupils: Pupils are equal, round, and reactive to light.  Neck:     Vascular: No carotid bruit.     Trachea: No tracheal deviation.  Cardiovascular:     Rate and Rhythm: Normal rate and regular rhythm.     Pulses: Normal pulses.     Heart sounds: Normal heart sounds. No murmur. No friction rub. No gallop.   Pulmonary:     Effort: Pulmonary effort is normal. No accessory muscle usage or respiratory distress.     Breath sounds: Normal breath sounds.  Chest:     Chest wall: No tenderness.  Abdominal:     General: Bowel sounds are normal. There is no distension.     Palpations: Abdomen is soft.     Tenderness: There is no abdominal tenderness. There is no guarding.     Comments: No abd contusion or bruising.   Genitourinary:    Comments: No cva tenderness. Musculoskeletal:        General: No swelling or tenderness.     Cervical back: Normal range of motion and neck supple. No  rigidity.     Comments: CTLS spine, non tender, aligned, no step off. RUE with proximal humerus tenderness, and ecchymosis, otherwise good rom bil ext without pain or focal bony tenderness. Distal pulses palp bil.   Skin:    General: Skin is warm and dry.     Findings: No rash.  Neurological:     Mental Status: He is alert.     Comments: Alert, speech clear. Motor intact bil, stre 5/5. sens grossly intact. Generally shaky/tremulous.   Psychiatric:     Comments: Odd affect, flat.      ED Results / Procedures / Treatments   Labs (all labs ordered are listed, but only abnormal results are displayed) Results for orders placed or performed during the hospital encounter of 08/18/19  CBC  Result Value Ref Range   WBC 10.5 4.0 - 10.5 K/uL   RBC 3.02 (L) 4.22 - 5.81 MIL/uL   Hemoglobin 10.8 (L) 13.0 - 17.0 g/dL   HCT 33.0 (L) 39.0 - 52.0 %   MCV 109.3 (H) 80.0 - 100.0 fL   MCH 35.8 (H) 26.0 - 34.0 pg   MCHC 32.7 30.0 - 36.0 g/dL   RDW 14.4 11.5 - 15.5 %   Platelets 197 150 - 400 K/uL   nRBC 0.0 0.0 - 0.2 %  Comprehensive metabolic panel  Result Value Ref Range   Sodium 140 135 - 145 mmol/L   Potassium 3.9 3.5 - 5.1 mmol/L   Chloride 103 98 - 111 mmol/L   CO2 24 22 - 32 mmol/L   Glucose, Bld 126 (H) 70 - 99 mg/dL   BUN 23 8 - 23 mg/dL   Creatinine, Ser 1.32 (H) 0.61 - 1.24 mg/dL   Calcium 8.6 (L) 8.9 - 10.3 mg/dL   Total Protein 6.3 (L) 6.5 - 8.1 g/dL   Albumin 2.8 (L) 3.5 - 5.0 g/dL   AST 52 (H) 15 - 41 U/L   ALT 48 (H) 0 - 44 U/L   Alkaline  Phosphatase 64 38 - 126 U/L   Total Bilirubin 1.7 (H) 0.3 - 1.2 mg/dL   GFR calc non Af Amer 53 (L) >60 mL/min   GFR calc Af Amer >60 >60 mL/min   Anion gap 13 5 - 15  Ethanol  Result Value Ref Range   Alcohol, Ethyl (B) <10 <10 mg/dL  CK  Result Value Ref Range   Total CK 281 49 - 397 U/L   DG Shoulder Right  Result Date: 08/15/2019 CLINICAL DATA:  Fall EXAM: RIGHT SHOULDER - 2+ VIEW COMPARISON:  None. FINDINGS: There is a  comminuted mildly displaced impacted fracture seen through the surgical neck of the right humerus involving the inferior portion the greater tuberosity and the medial humeral head. The humeral head still partially articulates with the glenoid. There is diffuse osteopenia. Overlying soft tissue swelling is noted. IMPRESSION: Comminuted mildly displaced proximal humerus fracture. Electronically Signed   By: Prudencio Pair M.D.   On: 08/15/2019 01:29   DG Elbow Complete Right  Result Date: 08/17/2019 CLINICAL DATA:  Recent fall with proximal humeral fracture, initial encounter EXAM: RIGHT ELBOW - COMPLETE 3+ VIEW COMPARISON:  10/04/2017 FINDINGS: Degenerative changes are noted about the elbow joint. A joint effusion is seen although no acute fracture is noted. There is some remodeling of the radial head noted which appears chronic in nature similar to that noted on the prior exam. IMPRESSION: Chronic degenerative changes with remodeling of the radial head. Joint effusion is noted but stable from the prior exam from 2 years ago. Electronically Signed   By: Inez Catalina M.D.   On: 08/17/2019 19:48   CT Head Wo Contrast  Result Date: 08/15/2019 CLINICAL DATA:  Fall EXAM: CT HEAD WITHOUT CONTRAST TECHNIQUE: Contiguous axial images were obtained from the base of the skull through the vertex without intravenous contrast. COMPARISON:  None. FINDINGS: Brain: There is atrophy and chronic small vessel disease changes. No acute intracranial abnormality. Specifically, no hemorrhage, hydrocephalus, mass lesion, acute infarction, or significant intracranial injury. Vascular: No hyperdense vessel or unexpected calcification. Skull: No acute calvarial abnormality. Sinuses/Orbits: Visualized paranasal sinuses and mastoids clear. Orbital soft tissues unremarkable. Other: None IMPRESSION: Atrophy, chronic microvascular disease. No acute intracranial abnormality. Electronically Signed   By: Rolm Baptise M.D.   On: 08/15/2019 17:30    CT Cervical Spine Wo Contrast  Result Date: 08/15/2019 CLINICAL DATA:  Fall EXAM: CT CERVICAL SPINE WITHOUT CONTRAST TECHNIQUE: Multidetector CT imaging of the cervical spine was performed without intravenous contrast. Multiplanar CT image reconstructions were also generated. COMPARISON:  None. FINDINGS: Alignment: Normal Skull base and vertebrae: No acute fracture. No primary bone lesion or focal pathologic process. Soft tissues and spinal canal: No prevertebral fluid or swelling. No visible canal hematoma. Disc levels:  Diffuse degenerative disc disease and facet disease. Upper chest: Old healed right rib fractures.  No acute findings. Other: Carotid artery calcifications. IMPRESSION: Degenerative disc and facet disease.  No acute bony abnormality. Electronically Signed   By: Rolm Baptise M.D.   On: 08/15/2019 17:32    EKG EKG Interpretation  Date/Time:  Friday August 18 2019 12:47:25 EDT Ventricular Rate:  75 PR Interval:    QRS Duration: 156 QT Interval:  449 QTC Calculation: 502 R Axis:   128 Text Interpretation: Electronic ventricular pacemaker Confirmed by Lajean Saver [phone]) on 08/18/2019 12:56:57 PM   Radiology DG Elbow Complete Right  Result Date: 08/17/2019 CLINICAL DATA:  Recent fall with proximal humeral fracture, initial encounter EXAM: RIGHT ELBOW -  COMPLETE 3+ VIEW COMPARISON:  10/04/2017 FINDINGS: Degenerative changes are noted about the elbow joint. A joint effusion is seen although no acute fracture is noted. There is some remodeling of the radial head noted which appears chronic in nature similar to that noted on the prior exam. IMPRESSION: Chronic degenerative changes with remodeling of the radial head. Joint effusion is noted but stable from the prior exam from 2 years ago. Electronically Signed   By: Inez Catalina M.D.   On: 08/17/2019 19:48   CT HEAD WO CONTRAST  Result Date: 08/18/2019 CLINICAL DATA:  Several recent falls.  Headache.  Dizziness. EXAM: CT HEAD WITHOUT  CONTRAST TECHNIQUE: Contiguous axial images were obtained from the base of the skull through the vertex without intravenous contrast. COMPARISON:  August 15, 2019 FINDINGS: Brain: Mild to moderate diffuse atrophy is stable. There is no intracranial mass, hemorrhage, extra-axial fluid collection, or midline shift. There is slight small vessel disease in the centra semiovale bilaterally. No acute infarct evident. Vascular: No hyperdense vessel. There is calcification in each carotid siphon region. Skull: Bony calvarium appears intact. Sinuses/Orbits: There is mucosal thickening in the right maxillary antrum. There is mucosal thickening in several ethmoid air cells. Orbits appear symmetric bilaterally. Other: Mastoid air cells are clear. 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 No acute infarct. No mass or hemorrhage. There are foci of arterial vascular calcification. There are foci of paranasal sinus disease. Electronically Signed   By: Lowella Grip III M.D.   On: 08/18/2019 13:42    Procedures Procedures (including critical care time)  Medications Ordered in ED Medications  sodium chloride 0.9 % 1,000 mL with thiamine 751 mg, folic acid 1 mg, multivitamins adult 10 mL infusion (has no administration in time range)    ED Course  I have reviewed the triage vital signs and the nursing notes.  Pertinent labs & imaging results that were available during my care of the patient were reviewed by me and considered in my medical decision making (see chart for details).    MDM Rules/Calculators/A&P                     Iv ns. Continuous pulse ox and monitor. Stat labs. Imaging ordered.   Reviewed nursing notes and prior charts for additional history. Recent ED visits reviewed - falls, with humerus fx.   Initial labs reviewed/interpreted by me - wbc normal. Lytes normal. Ck normal. Overall, labs appears similar to baseline.   CT reviewed/interpreted by me - no  hem.  Po fluids, food. Ambulate in hall.  Recheck pt, no new c/o. Currently denies pain. No headache. No cp. No abd pain. Denies sob. CTLS spine, non tender, aligned, no step off. Abd soft nt.   Patient w several recent falls - ?whether home opiate pain meds + bzd medication contributing. Will get [facility] consult  Re home health vs ECF.  Signed out to Dr Roderic Palau.    Final Clinical Impression(s) / ED Diagnoses Final diagnoses:  None    Rx / DC Orders ED Discharge Orders    None       Lajean Saver, MD 08/18/19 1539

## 2019-08-18 NOTE — ED Notes (Signed)
Pt in CT at this time. Will complete EKG upon arrival to room.

## 2019-08-18 NOTE — Discharge Instructions (Addendum)
Follow-up with your family doctor next week and social worker has arranged for home health to help you at home.

## 2019-08-18 NOTE — ED Provider Notes (Signed)
Patient with 3 falls over the last 4 days.  He has broken his arm.  He is too weak to be sent back home and has agreed to nursing home placement.  He will be admitted to medicine   Milton Ferguson, MD 08/18/19 2004

## 2019-08-18 NOTE — Clinical Social Work Note (Signed)
Transition of Care [facility]) - Emergency Department Mini Assessment  Patient Details  Name: Alan Orr MRN: [id number] Date of Birth: Mar 19, 1945  [facility]) CM/SW Contact:    Sherie Don, LCSW Phone Number: 08/18/2019, 5:03 PM  Clinical Narrative: Patient is a 74 year old male who presented to the ED for weakness and frequent falls. TOC received consult for [facility] CSW met with patient in ED. CSW provided patient with [facility] providers and patient agreeable to referral to [facility]. CSW spoke with [place] with [facility]. Referral was accepted. EDP updated. TOC signing off.  ED Mini Assessment: What brought you to the Emergency Department? : Weakness and multiple falls Barriers to Discharge: ED Barriers Resolved Barrier interventions: Referral for [facility] Means of departure: Car Interventions which prevented an admission or readmission: [place] or Services  Patient Contact and Communications Key Contact 1: [name] Spoke with: [name] Contact Date: 08/18/19,   Contact time: 1659 Contact Phone Number: [phone] Call outcome: Referred to [facility] Patient states their goals for this hospitalization and ongoing recovery are:: Return home w/HH CMS Medicare.gov Compare Post Acute Care list provided to:: Patient Choice offered to / list presented to : Patient  Admission diagnosis:  weakness Patient Active Problem List   Diagnosis Date Noted  . AKI (acute kidney injury) ([place]) 08/15/2019  . Cellulitis of right lower extremity 06/21/2018  . Salmonella sepsis ([place]) 10/21/2016  . Salmonella gastroenteritis 10/20/2016  . Transaminasemia 10/18/2016  . Pancytopenia ([place]) 10/18/2016  . DM type 2 (diabetes mellitus, type 2) ([place]) 10/18/2016  . Thrombocytopenia ([place]) 08/22/2016  . Tick bite of right thigh 08/22/2016  . Abnormal serum protein electrophoresis 08/21/2014  . Leg wound, left 11/03/2013  . History of carotid artery disease 03/03/2013  . Cardiomyopathy,  ischemic 11/08/2012  . CAD s/p CABG  11/08/2012  . Hyperlipidemia 11/08/2012  . Paroxysmal atrial fibrillation ([place]) 11/08/2012  . Ventricular tachycardia ([place]) 11/08/2012  . Type 2 diabetes mellitus with stage 3 chronic kidney disease ([place]) 08/20/2009  . PACEMAKER, PERMANENT 08/20/2009  . Biventricular ICD (implantable cardioverter-defibrillator) in place 08/20/2009   PCP:  [name], MD Pharmacy:   [place], [place] [address] [place] [address] Phone: [phone] Fax: [phone]

## 2019-08-19 ENCOUNTER — Encounter (HOSPITAL_COMMUNITY): Payer: Self-pay | Admitting: Internal Medicine

## 2019-08-19 ENCOUNTER — Other Ambulatory Visit: Payer: Self-pay

## 2019-08-19 DIAGNOSIS — E039 Hypothyroidism, unspecified: Secondary | ICD-10-CM | POA: Diagnosis present

## 2019-08-19 DIAGNOSIS — E860 Dehydration: Secondary | ICD-10-CM | POA: Diagnosis present

## 2019-08-19 DIAGNOSIS — E1151 Type 2 diabetes mellitus with diabetic peripheral angiopathy without gangrene: Secondary | ICD-10-CM | POA: Diagnosis present

## 2019-08-19 DIAGNOSIS — I4510 Unspecified right bundle-branch block: Secondary | ICD-10-CM | POA: Diagnosis present

## 2019-08-19 DIAGNOSIS — R197 Diarrhea, unspecified: Secondary | ICD-10-CM | POA: Diagnosis not present

## 2019-08-19 DIAGNOSIS — Z789 Other specified health status: Secondary | ICD-10-CM | POA: Diagnosis present

## 2019-08-19 DIAGNOSIS — Z20822 Contact with and (suspected) exposure to covid-19: Secondary | ICD-10-CM | POA: Diagnosis present

## 2019-08-19 DIAGNOSIS — I255 Ischemic cardiomyopathy: Secondary | ICD-10-CM | POA: Diagnosis present

## 2019-08-19 DIAGNOSIS — N183 Chronic kidney disease, stage 3 unspecified: Secondary | ICD-10-CM | POA: Diagnosis present

## 2019-08-19 DIAGNOSIS — I2510 Atherosclerotic heart disease of native coronary artery without angina pectoris: Secondary | ICD-10-CM | POA: Diagnosis present

## 2019-08-19 DIAGNOSIS — S42201A Unspecified fracture of upper end of right humerus, initial encounter for closed fracture: Secondary | ICD-10-CM | POA: Diagnosis present

## 2019-08-19 DIAGNOSIS — I129 Hypertensive chronic kidney disease with stage 1 through stage 4 chronic kidney disease, or unspecified chronic kidney disease: Secondary | ICD-10-CM | POA: Diagnosis present

## 2019-08-19 DIAGNOSIS — Y92009 Unspecified place in unspecified non-institutional (private) residence as the place of occurrence of the external cause: Secondary | ICD-10-CM | POA: Diagnosis not present

## 2019-08-19 DIAGNOSIS — R531 Weakness: Secondary | ICD-10-CM | POA: Diagnosis present

## 2019-08-19 DIAGNOSIS — E1122 Type 2 diabetes mellitus with diabetic chronic kidney disease: Secondary | ICD-10-CM | POA: Diagnosis present

## 2019-08-19 DIAGNOSIS — F419 Anxiety disorder, unspecified: Secondary | ICD-10-CM | POA: Diagnosis present

## 2019-08-19 DIAGNOSIS — I472 Ventricular tachycardia: Secondary | ICD-10-CM | POA: Diagnosis not present

## 2019-08-19 DIAGNOSIS — R296 Repeated falls: Secondary | ICD-10-CM

## 2019-08-19 DIAGNOSIS — N179 Acute kidney failure, unspecified: Secondary | ICD-10-CM | POA: Diagnosis present

## 2019-08-19 DIAGNOSIS — W1830XA Fall on same level, unspecified, initial encounter: Secondary | ICD-10-CM | POA: Diagnosis present

## 2019-08-19 DIAGNOSIS — H9190 Unspecified hearing loss, unspecified ear: Secondary | ICD-10-CM | POA: Diagnosis present

## 2019-08-19 DIAGNOSIS — N1831 Chronic kidney disease, stage 3a: Secondary | ICD-10-CM | POA: Diagnosis present

## 2019-08-19 DIAGNOSIS — G894 Chronic pain syndrome: Secondary | ICD-10-CM | POA: Diagnosis present

## 2019-08-19 DIAGNOSIS — Z951 Presence of aortocoronary bypass graft: Secondary | ICD-10-CM | POA: Diagnosis not present

## 2019-08-19 DIAGNOSIS — Z86718 Personal history of other venous thrombosis and embolism: Secondary | ICD-10-CM | POA: Diagnosis not present

## 2019-08-19 DIAGNOSIS — I480 Paroxysmal atrial fibrillation: Secondary | ICD-10-CM | POA: Diagnosis present

## 2019-08-19 DIAGNOSIS — E785 Hyperlipidemia, unspecified: Secondary | ICD-10-CM | POA: Diagnosis present

## 2019-08-19 LAB — RAPID URINE DRUG SCREEN, HOSP PERFORMED
Amphetamines: NOT DETECTED
Barbiturates: NOT DETECTED
Benzodiazepines: POSITIVE — AB
Cocaine: NOT DETECTED
Opiates: POSITIVE — AB
Tetrahydrocannabinol: NOT DETECTED

## 2019-08-19 LAB — CBC
HCT: 30 % — ABNORMAL LOW (ref 39.0–52.0)
Hemoglobin: 9.7 g/dL — ABNORMAL LOW (ref 13.0–17.0)
MCH: 34.5 pg — ABNORMAL HIGH (ref 26.0–34.0)
MCHC: 32.3 g/dL (ref 30.0–36.0)
MCV: 106.8 fL — ABNORMAL HIGH (ref 80.0–100.0)
Platelets: 186 10*3/uL (ref 150–400)
RBC: 2.81 MIL/uL — ABNORMAL LOW (ref 4.22–5.81)
RDW: 14.4 % (ref 11.5–15.5)
WBC: 6.7 10*3/uL (ref 4.0–10.5)
nRBC: 0 % (ref 0.0–0.2)

## 2019-08-19 LAB — URINALYSIS, ROUTINE W REFLEX MICROSCOPIC
Bacteria, UA: NONE SEEN
Bilirubin Urine: NEGATIVE
Glucose, UA: NEGATIVE mg/dL
Ketones, ur: 5 mg/dL — AB
Leukocytes,Ua: NEGATIVE
Nitrite: NEGATIVE
Protein, ur: NEGATIVE mg/dL
Specific Gravity, Urine: 1.02 (ref 1.005–1.030)
pH: 5 (ref 5.0–8.0)

## 2019-08-19 LAB — BASIC METABOLIC PANEL
Anion gap: 7 (ref 5–15)
BUN: 19 mg/dL (ref 8–23)
CO2: 26 mmol/L (ref 22–32)
Calcium: 8.1 mg/dL — ABNORMAL LOW (ref 8.9–10.3)
Chloride: 108 mmol/L (ref 98–111)
Creatinine, Ser: 1.19 mg/dL (ref 0.61–1.24)
GFR calc Af Amer: >60 mL/min (ref >60)
GFR calc non Af Amer: 60 mL/min — ABNORMAL LOW (ref >60)
Glucose, Bld: 107 mg/dL — ABNORMAL HIGH (ref 70–99)
Potassium: 3.9 mmol/L (ref 3.5–5.1)
Sodium: 141 mmol/L (ref 135–145)

## 2019-08-19 LAB — GLUCOSE, CAPILLARY
Glucose-Capillary: 125 mg/dL — ABNORMAL HIGH (ref 70–99)
Glucose-Capillary: 118 mg/dL — ABNORMAL HIGH (ref 70–99)
Glucose-Capillary: 140 mg/dL — ABNORMAL HIGH (ref 70–99)
Glucose-Capillary: 110 mg/dL — ABNORMAL HIGH (ref 70–99)

## 2019-08-19 LAB — HEMOGLOBIN A1C
Hgb A1c MFr Bld: 6 % — ABNORMAL HIGH (ref 4.8–5.6)
Mean Plasma Glucose: 125.5 mg/dL

## 2019-08-19 MED ORDER — EZETIMIBE 10 MG PO TABS
10.0000 mg | ORAL_TABLET | Freq: Every day | ORAL | Status: DC
  Administered 2019-08-19 – 2019-08-22 (×4): 10 mg via ORAL
  Filled 2019-08-19 (×4): qty 1

## 2019-08-19 MED ORDER — KETOROLAC TROMETHAMINE 15 MG/ML IJ SOLN
15.0000 mg | Freq: Four times a day (QID) | INTRAMUSCULAR | Status: DC | PRN
  Administered 2019-08-19 – 2019-08-20 (×2): 15 mg via INTRAVENOUS
  Filled 2019-08-19 (×3): qty 1

## 2019-08-19 MED ORDER — NITROGLYCERIN 0.4 MG SL SUBL: 0.4000 mg | SUBLINGUAL_TABLET | SUBLINGUAL | Status: DC | PRN

## 2019-08-19 MED ORDER — LEVOTHYROXINE SODIUM 137 MCG PO TABS
137.0000 ug | ORAL_TABLET | Freq: Every day | ORAL | Status: DC
  Administered 2019-08-19 – 2019-08-22 (×4): 137 ug via ORAL
  Filled 2019-08-19 (×4): qty 1

## 2019-08-19 MED ORDER — DABIGATRAN ETEXILATE MESYLATE 75 MG PO CAPS
75.0000 mg | ORAL_CAPSULE | Freq: Two times a day (BID) | ORAL | Status: DC
  Administered 2019-08-19 – 2019-08-22 (×7): 75 mg via ORAL
  Filled 2019-08-19 (×7): qty 1

## 2019-08-19 MED ORDER — AMIODARONE HCL 200 MG PO TABS
200.0000 mg | ORAL_TABLET | Freq: Every day | ORAL | Status: DC
  Administered 2019-08-19 – 2019-08-22 (×4): 200 mg via ORAL
  Filled 2019-08-19 (×4): qty 1

## 2019-08-19 MED ORDER — ALPRAZOLAM 1 MG PO TABS
1.0000 mg | ORAL_TABLET | Freq: Two times a day (BID) | ORAL | Status: DC | PRN
  Administered 2019-08-21: 1 mg via ORAL
  Filled 2019-08-19: qty 1

## 2019-08-19 MED ORDER — RAMIPRIL 1.25 MG PO CAPS
2.5000 mg | ORAL_CAPSULE | Freq: Every day | ORAL | Status: DC
  Administered 2019-08-19 – 2019-08-21 (×3): 2.5 mg via ORAL
  Filled 2019-08-19 (×3): qty 2

## 2019-08-19 MED ORDER — ATORVASTATIN CALCIUM 40 MG PO TABS
40.0000 mg | ORAL_TABLET | Freq: Every day | ORAL | Status: DC
  Administered 2019-08-19: 40 mg via ORAL
  Filled 2019-08-19: qty 1

## 2019-08-19 MED ORDER — METOPROLOL TARTRATE 50 MG PO TABS
50.0000 mg | ORAL_TABLET | Freq: Two times a day (BID) | ORAL | Status: DC
  Administered 2019-08-19 – 2019-08-22 (×5): 50 mg via ORAL
  Filled 2019-08-19 (×7): qty 1

## 2019-08-19 MED ORDER — OXYCODONE HCL 5 MG PO TABS
10.0000 mg | ORAL_TABLET | Freq: Four times a day (QID) | ORAL | Status: DC | PRN
  Administered 2019-08-19 – 2019-08-21 (×2): 10 mg via ORAL
  Filled 2019-08-19 (×2): qty 2

## 2019-08-19 MED ORDER — ASPIRIN EC 81 MG PO TBEC
81.0000 mg | DELAYED_RELEASE_TABLET | Freq: Every day | ORAL | Status: DC
  Administered 2019-08-19 – 2019-08-21 (×3): 81 mg via ORAL
  Filled 2019-08-19 (×3): qty 1

## 2019-08-19 MED ORDER — OXYCODONE HCL 5 MG PO TABS: 20.0000 mg | ORAL_TABLET | Freq: Four times a day (QID) | ORAL | Status: DC | PRN

## 2019-08-19 NOTE — Progress Notes (Signed)
Per HPI: Alan Orr is a 74 y.o. male with medical history significant of with hypertension, paroxysmal atrial fibrillation on Pradaxa, coronary artery disease s/p CABG, history of ventricular tachycardia with pacer/defibrillator and chronic pain syndrome on opiates and benzodiazepine is brought to ED for evaluation of fall.  During my evaluation patient is awake and alert but not oriented to place person time and situation.  Patient does not know why he is brought to the hospital today.  According to the ED physician patient was brought by EMS after he was found on the floor by the family and patient recently had multiple falls in the last 4 to 5 days.  Patient also broke his right arm.  Patient lives alone by himself and his nephew visits him every day.  Patient is complaining of mild pain in right upper extremity but denies fever, headache, dizziness, chest pain, shortness of breath, nausea, vomiting, abdominal pain and urinary symptoms.  Looking at patient's condition it does not look safer for him to live alone at home and he need nursing home placement.  -Patient admitted with frequent falls and inability to care for himself which may be related to polypharmacy in the setting of benzodiazepine use.  He is also noted to have right upper extremity pain with recent right humeral fracture.  Sling will be placed to right upper extremity.  PT evaluation currently pending.  We will follow a.m. labs.  Likely will need to discontinue Pradaxa in the near future if he still continues to be a high fall risk after rehab placement. Discussed with nephew on phone.  Total care time: 30 minutes.

## 2019-08-19 NOTE — Plan of Care (Signed)
  Problem: Clinical Measurements: Goal: Will remain free from infection Outcome: Progressing   Problem: Activity: Goal: Risk for activity intolerance will decrease Outcome: Progressing   Problem: Safety: Goal: Ability to remain free from injury will improve Outcome: Progressing   Problem: Pain Managment: Goal: General experience of comfort will improve Outcome: Progressing   Problem: Skin Integrity: Goal: Risk for impaired skin integrity will decrease Outcome: Progressing

## 2019-08-19 NOTE — Progress Notes (Signed)
Sling applied to right arm for support and immobilization.

## 2019-08-19 NOTE — H&P (Signed)
History and Physical    Alan Orr:[id number] DOB: [birth year] DOA: 08/18/2019  PCP: [name], MD (Confirm with patient/family/NH records and if not entered, this has to be entered at [facility] point of entry) Patient coming from: Home  I have personally briefly reviewed patient's old medical records in [place]  Chief Complaint: Body  HPI: Alan Orr is a 74 y.o. male with medical history significant of with hypertension, paroxysmal atrial fibrillation on Pradaxa, coronary artery disease s/p CABG, history of ventricular tachycardia with pacer/defibrillator and chronic pain syndrome on opiates and benzodiazepine is brought to ED for evaluation of fall.  During my evaluation patient is awake and alert but not oriented to place person time and situation.  Patient does not know why he is brought to the hospital today.  According to the ED physician patient was brought by EMS after he was found on the floor by the family and patient recently had multiple falls in the last 4 to 5 days.  Patient also broke his right arm.  Patient lives alone by himself and his nephew visits him every day.  Patient is complaining of mild pain in right upper extremity but denies fever, headache, dizziness, chest pain, shortness of breath, nausea, vomiting, abdominal pain and urinary symptoms.  Looking at patient's condition it does not look safer for him to live alone at home and he need nursing home placement.  (For level 3, the HPI must include 4+ descriptors: Location, Quality, Severity, Duration, Timing, Context, modifying factors, associated signs/symptoms and/or status of 3+ chronic problems.)  (Please avoid self-populating past medical history here) (The initial 2-3 lines should be focused and good to copy and paste in the HPI section of the daily progress note).  ED Course: On arrival to the ED patient had blood pressure of 123/84, temperature 98.7, heart rate 72, respiratory rate 18 and oxygen  saturation 96% on room air.  Blood work showed WBC 10.5, hemoglobin 10.8, sodium 140, potassium 3.9, BUN 23, creatinine 1.3 and blood glucose 126.  Alcohol level less than 10.  Pacemaker rhythm CT head negative for acute intracranial bleed or pathology but positive for chronic stable atrophy and small vessel disease.  Patient was given 1 bolus of normal saline with thiamine and multivitamins in the ED.  ED physician also contacted transition of care team for evaluation and further recommendations.  Review of Systems: As per HPI otherwise 10 point review of systems negative.  Unacceptable ROS statements: "10 systems reviewed," "Extensive" (without elaboration).  Acceptable ROS statements: "All others negative," "All others reviewed and are negative," and "All others unremarkable," with at [place] documented Can't double dip - if using for HPI can't use for ROS  Past Medical History:  Diagnosis Date  . CAD (coronary artery disease)   . DM (diabetes mellitus) ([place])    type 2   . DVT (deep venous thrombosis) ([place])   . Hyperlipidemia   . Hypertension   . ICD (implantable cardioverter-defibrillator), single, in situ   . Ischemic cardiomyopathy   . PAF (paroxysmal atrial fibrillation) ([place])   . RBBB   . S/P CABG x 5     Past Surgical History:  Procedure Laterality Date  . BACK SURGERY    . CARDIAC CATHETERIZATION  05/08/2009   small vessel disease  . CARDIAC DEFIBRILLATOR PLACEMENT  05/09/2009   [employer]  . CORONARY ARTERY BYPASS GRAFT  12/31/1995   LIMA to LAD,SVG to intermediate,SVG to CX,seq. svg to posterior descending  and posterolateral RCA  . EP IMPLANTABLE DEVICE N/A 09/25/2015   Procedure: PPM/BIV PPM Generator Changeout;  Surgeon: Evans Lance, MD;  Location: [facility];  Service: Cardiovascular;  Laterality: N/A;  . I & D EXTREMITY Left 11/03/2013   Procedure: Left Leg Debride Ulcer, Apply Wound VAC and [place];  Surgeon: Newt Minion, MD;  Location: [facility];   Service: Orthopedics;  Laterality: Left;  . TEE WITHOUT CARDIOVERSION N/A 01/11/2015   Procedure: TRANSESOPHAGEAL ECHOCARDIOGRAM (TEE);  Surgeon: Herminio Commons, MD;  [facility];  Service: Cardiology;  Laterality: N/A;     reports that he quit smoking about 23 years ago. His smoking use included cigarettes. He has a 52.50 pack-year smoking history. He has never used smokeless tobacco. He reports current alcohol use. He reports that he does not use drugs.  No Known Allergies  Family History  Problem Relation Age of Onset  . Heart attack Mother   . Stroke Father     Unacceptable: Noncontributory, unremarkable, or negative. Acceptable: (example)Family history negative for heart disease  Prior to Admission medications   Medication Sig Start Date End Date Taking? Authorizing Provider  ALPRAZolam Duanne Moron) 1 MG tablet Take 1 tablet (1 mg total) by mouth daily as needed for anxiety or sleep. Patient taking differently: Take 1 mg by mouth 4 (four) times daily as needed for anxiety or sleep.  10/22/16  Yes Kathie Dike, MD  amiodarone (PACERONE) 200 MG tablet Take 200 mg by mouth daily.   Yes [provider]  aspirin EC 81 MG tablet Take 81 mg by mouth at bedtime.   Yes [provider]  atorvastatin (LIPITOR) 40 MG tablet Take 40 mg by mouth at bedtime.   Yes [provider]  dabigatran (PRADAXA) 75 MG CAPS capsule Take 1 capsule (75 mg total) by mouth every 12 (twelve) hours. 08/16/19  Yes Danford, Suann Larry, MD  ezetimibe (ZETIA) 10 MG tablet TAKE 1 TABLET ONCE DAILY FOR CHOLESTEROL. Patient taking differently: Take 10 mg by mouth daily.  01/04/17  Yes Lorretta Harp, MD  ferrous sulfate 325 (65 FE) MG tablet Take 325 mg by mouth daily with breakfast.   Yes [provider]  magnesium oxide (MAG-OX) 400 MG tablet Take 400 mg by mouth daily.   Yes [provider]  metoprolol tartrate (LOPRESSOR) 50 MG tablet TAKE 1 TABLET BY  MOUTH TWICE DAILY. Patient taking differently: Take 50 mg by mouth 2 (two) times daily.  10/10/18  Yes Evans Lance, MD  niacin (NIASPAN) 1000 MG CR tablet TAKE (1) TABLET BY MOUTH AT BEDTIME. Patient taking differently: Take 1,000 mg by mouth at bedtime.  10/15/16  Yes Bayard Hugger, MD  nitroGLYCERIN (NITROSTAT) 0.4 MG SL tablet Place 0.4 mg under the tongue every 5 (five) minutes x 3 doses as needed for chest pain. 10/31/15  Yes [provider]  Oxycodone HCl 20 MG TABS Take 1 tablet by mouth 4 (four) times daily as needed (for pain).  07/23/19  Yes [provider]  ramipril (ALTACE) 2.5 MG capsule Take 2.5 mg by mouth daily.   Yes [provider]  SYNTHROID 137 MCG tablet Take 137 mcg by mouth daily before breakfast.  07/20/16  Yes [provider]  Multiple Vitamin (MULTI-VITAMINS) TABS Take 1 tablet by mouth daily.    [provider]    Physical Exam: Vitals:   08/18/19 2100 08/18/19 2309 08/19/19 0100 08/19/19 0305  BP: (!) 144/68 132/62  Marland Kitchen)  125/96  Pulse: 78 77  89  Resp:  16  16  Temp:  98.9 F (37.2 C)  98.1 F (36.7 C)  TempSrc:  Oral  Oral  SpO2: 98% 97%  100%  Weight:   96 kg   Height:   6\' 2"  (1.88 m)     Constitutional: NAD, calm, comfortable Vitals:   08/18/19 2100 08/18/19 2309 08/19/19 0100 08/19/19 0305  BP: (!) 144/68 132/62  (!) 125/96  Pulse: 78 77  89  Resp:  16  16  Temp:  98.9 F (37.2 C)  98.1 F (36.7 C)  TempSrc:  Oral  Oral  SpO2: 98% 97%  100%  Weight:   96 kg   Height:   6\' 2"  (1.88 m)     General: Patient is awake, alert but not oriented to place person and time.  Patient not in acute distress. Eyes: PERRL, lids and conjunctivae normal ENMT: Mucous membranes are moist. Posterior pharynx clear of any exudate or lesions.Normal dentition.  Neck: normal, supple, no masses, no thyromegaly Respiratory: clear to auscultation bilaterally, no wheezing, no crackles. Normal respiratory effort. No accessory muscle  use.  Cardiovascular: Regular rate and rhythm, no murmurs / rubs / gallops. No extremity edema. 2+ pedal pulses. No carotid bruits.  Abdomen: no tenderness, no masses palpated. No hepatosplenomegaly. Bowel sounds positive.  Musculoskeletal: Swelling, ecchymosis and tenderness of right upper extremity with decreased range of motion while rest of the extremities have normal range of motion and no contractures. Normal muscle tone.  Skin: Swelling and redness with tenderness of right upper extremity. Neurologic: Patient is awake, alert but not oriented to time person and place.  Speech is loud.  CN 2-12 grossly intact. Sensation intact, DTR normal. Strength 5/5 in all 4.  Flat affect.  (Anything < 9 systems with 2 bullets each down codes to level 1) (If patient refuses exam can't bill higher level) (Make sure to document decubitus ulcers present on admission -- if possible -- and whether patient has chronic indwelling catheter at time of admission)  Labs on Admission: I have personally reviewed following labs and imaging studies  CBC: Recent Labs  Lab 08/15/19 1545 08/16/19 0442 08/17/19 2004 08/18/19 1154  WBC 11.5* 10.8* 11.8* 10.5  NEUTROABS 8.3*  --  9.0*  --   HGB 12.2* 11.2* 10.4* 10.8*  HCT 37.6* 34.3* 31.8* 33.0*  MCV 107.7* 107.9* 108.5* 109.3*  PLT 220 191 182 045   Basic Metabolic Panel: Recent Labs  Lab 08/15/19 1650 08/16/19 0442 08/17/19 2004 08/18/19 1154  NA 137 138 137 140  K 3.9 4.4 3.8 3.9  CL 104 105 104 103  CO2 24 27 23 24   GLUCOSE 168* 143* 153* 126*  BUN 25* 27* 30* 23  CREATININE 1.84* 1.71* 1.59* 1.32*  CALCIUM 8.2* 8.2* 8.4* 8.6*   GFR: Estimated Creatinine Clearance: 57.1 mL/min (A) (by C-G formula based on SCr of 1.32 mg/dL (H)). Liver Function Tests: Recent Labs  Lab 08/15/19 1650 08/16/19 0442 08/18/19 1154  AST 64* 51* 52*  ALT 66* 59* 48*  ALKPHOS 66 62 64  BILITOT 1.0 1.0 1.7*  PROT 6.1* 6.0* 6.3*  ALBUMIN 2.8* 2.7* 2.8*   No  results for input(s): LIPASE, AMYLASE in the last 168 hours. No results for input(s): AMMONIA in the last 168 hours. Coagulation Profile: No results for input(s): INR, PROTIME in the last 168 hours. Cardiac Enzymes: Recent Labs  Lab 08/17/19 2004 08/18/19 1154  CKTOTAL 190 281  BNP (last 3 results) No results for input(s): PROBNP in the last 8760 hours. HbA1C: No results for input(s): HGBA1C in the last 72 hours. CBG: Recent Labs  Lab 08/18/19 2322  GLUCAP 89   Lipid Profile: No results for input(s): CHOL, HDL, LDLCALC, TRIG, CHOLHDL, LDLDIRECT in the last 72 hours. Thyroid Function Tests: No results for input(s): TSH, T4TOTAL, FREET4, T3FREE, THYROIDAB in the last 72 hours. Anemia Panel: No results for input(s): VITAMINB12, FOLATE, FERRITIN, TIBC, IRON, RETICCTPCT in the last 72 hours. Urine analysis:    Component Value Date/Time   COLORURINE YELLOW 12/25/2017 [place] 12/25/2017 0958   LABSPEC 1.023 12/25/2017 0958   PHURINE 6.0 12/25/2017 0958   GLUCOSEU NEGATIVE 12/25/2017 0958   HGBUR NEGATIVE 12/25/2017 0958   BILIRUBINUR NEGATIVE 12/25/2017 0958   KETONESUR 5 (A) 12/25/2017 0958   PROTEINUR 30 (A) 12/25/2017 0958   UROBILINOGEN 0.2 01/05/2015 1245   NITRITE NEGATIVE 12/25/2017 0958   LEUKOCYTESUR NEGATIVE 12/25/2017 0958    Radiological Exams on Admission: DG Elbow Complete Right  Result Date: 08/17/2019 CLINICAL DATA:  Recent fall with proximal humeral fracture, initial encounter EXAM: RIGHT ELBOW - COMPLETE 3+ VIEW COMPARISON:  10/04/2017 FINDINGS: Degenerative changes are noted about the elbow joint. A joint effusion is seen although no acute fracture is noted. There is some remodeling of the radial head noted which appears chronic in nature similar to that noted on the prior exam. IMPRESSION: Chronic degenerative changes with remodeling of the radial head. Joint effusion is noted but stable from the prior exam from 2 years ago. Electronically  Signed   By: Inez Catalina M.D.   On: 08/17/2019 19:48   CT HEAD WO CONTRAST  Result Date: 08/18/2019 CLINICAL DATA:  Several recent falls.  Headache.  Dizziness. EXAM: CT HEAD WITHOUT CONTRAST TECHNIQUE: Contiguous axial images were obtained from the base of the skull through the vertex without intravenous contrast. COMPARISON:  August 15, 2019 FINDINGS: Brain: Mild to moderate diffuse atrophy is stable. There is no intracranial mass, hemorrhage, extra-axial fluid collection, or midline shift. There is slight small vessel disease in the centra semiovale bilaterally. No acute infarct evident. Vascular: No hyperdense vessel. There is calcification in each carotid siphon region. Skull: Bony calvarium appears intact. Sinuses/Orbits: There is mucosal thickening in the right maxillary antrum. There is mucosal thickening in several ethmoid air cells. Orbits appear symmetric bilaterally. Other: Mastoid air cells are clear. 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 No acute infarct. No mass or hemorrhage. There are foci of arterial vascular calcification. There are foci of paranasal sinus disease. Electronically Signed   By: Lowella Grip III M.D.   On: 08/18/2019 13:42      Assessment/Plan Principal Problem:   Frequent falls Patient had 3 falls in the last 4 days and also broke his right humerus during the last episode.  Patient lives alone by himself and unable to take good care of himself.  Patient is on opioids and benzodiazepine chronically that may be contributing to his frequent falls.  CT head negative for acute intracranial bleed or pathology but showed chronic atrophic changes and small vascular disease.  ED physician contacted [facility] for evaluation and recommendations because it is very unsafe for patient to stay alone at home.  Patient is most probably having underlying dementia.  Active Problems: Unable to care for self Transition of care team consult ordered for  evaluation and further recommendation.  PT/OT consult ordered for evaluation and treatment for  frequent falls    Type 2 diabetes mellitus with stage 3 chronic kidney disease (HCC) Low-dose sliding scale insulin ordered. Blood glucose monitoring and hypoglycemic protocol in place.    Paroxysmal atrial fibrillation (HCC) Stable Continue home amiodarone and Pradaxa  Right upper extremity pain Tylenol as needed for mild pain, oxycodone every 4 hours as needed for moderate pain while IV morphine 2 mg every 4 hours as needed for severe pain ordered  Anxiety Patient has a history of anxiety but denies any symptoms at this time.  Hold home benzodiazepine at this time because of frequent falls.  Hypothyroidism Continue home dose of levothyroxine  Hyperlipidemia Continue home Lipitor.  DVT prophylaxis: Pradaxa Code Status: Full code Family Communication: No family member present at bedside Disposition Plan: Patient will be most probably discharge to skilled nursing facility Consults called: [place] team Admission status: Observation/MedSurg   Edmonia Lynch MD [facility] Pager [phone]   If 7PM-7AM, please contact night-coverage [email or web address] Password   08/19/2019, 6:26 AM

## 2019-08-19 NOTE — Progress Notes (Signed)
   08/19/19 2145  Provider Notification  Provider Name/Title Dr Humphrey Rolls  Date Provider Notified 08/19/19  Time Provider Notified 2145  Notification Type Page  Notification Reason Other (Comment) (HR 56 and he's on metoprolol)  Response Other (Comment) (hold on to metorolol )  Date of Provider Response 08/19/19  Time of Provider Response 2147

## 2019-08-20 LAB — BASIC METABOLIC PANEL
Anion gap: 6 (ref 5–15)
BUN: 24 mg/dL — ABNORMAL HIGH (ref 8–23)
CO2: 26 mmol/L (ref 22–32)
Calcium: 8.4 mg/dL — ABNORMAL LOW (ref 8.9–10.3)
Chloride: 108 mmol/L (ref 98–111)
Creatinine, Ser: 1.37 mg/dL — ABNORMAL HIGH (ref 0.61–1.24)
GFR calc Af Amer: 58 mL/min — ABNORMAL LOW (ref >60)
GFR calc non Af Amer: 50 mL/min — ABNORMAL LOW (ref >60)
Glucose, Bld: 114 mg/dL — ABNORMAL HIGH (ref 70–99)
Potassium: 3.7 mmol/L (ref 3.5–5.1)
Sodium: 140 mmol/L (ref 135–145)

## 2019-08-20 LAB — CBC
HCT: 31.7 % — ABNORMAL LOW (ref 39.0–52.0)
Hemoglobin: 10.4 g/dL — ABNORMAL LOW (ref 13.0–17.0)
MCH: 35.4 pg — ABNORMAL HIGH (ref 26.0–34.0)
MCHC: 32.8 g/dL (ref 30.0–36.0)
MCV: 107.8 fL — ABNORMAL HIGH (ref 80.0–100.0)
Platelets: 226 10*3/uL (ref 150–400)
RBC: 2.94 MIL/uL — ABNORMAL LOW (ref 4.22–5.81)
RDW: 14.8 % (ref 11.5–15.5)
WBC: 6.4 10*3/uL (ref 4.0–10.5)
nRBC: 0 % (ref 0.0–0.2)

## 2019-08-20 LAB — CK: Total CK: 108 U/L (ref 49–397)

## 2019-08-20 LAB — GLUCOSE, CAPILLARY
Glucose-Capillary: 103 mg/dL — ABNORMAL HIGH (ref 70–99)
Glucose-Capillary: 126 mg/dL — ABNORMAL HIGH (ref 70–99)
Glucose-Capillary: 120 mg/dL — ABNORMAL HIGH (ref 70–99)
Glucose-Capillary: 119 mg/dL — ABNORMAL HIGH (ref 70–99)

## 2019-08-20 MED ORDER — LACTATED RINGERS IV SOLN: INTRAVENOUS | Status: AC

## 2019-08-20 NOTE — Progress Notes (Signed)
PROGRESS NOTE    Alan Orr  DGL:[id number] DOB: January 07, 1946 DOA: 08/18/2019 PCP: [name], MD   Brief Narrative:  Per HPI: Alan Orr a 74 y.o.malewith medical history significant ofwithhypertension, paroxysmalatrial fibrillation on Pradaxa, coronary artery disease s/p CABG, history of ventricular tachycardia with pacer/defibrillator and chronic pain syndrome on opiates and benzodiazepine is brought to ED for evaluation of fall. During my evaluation patient is awake and alert but not oriented to place person time and situation. Patient does not know why he is brought to the hospital today. According to the ED physician patient was brought by EMS after he was found on the floor by the family and patient recently had multiple falls in the last 4 to 5 days. Patient also broke his right arm. Patient lives alone by himself and his nephew visits him every day. Patient is complaining of mild pain in right upper extremity but denies fever, headache, dizziness, chest pain, shortness of breath, nausea, vomiting, abdominal pain and urinary symptoms. Looking at patient's condition it does not look saferfor him to live alone at home and he need nursing home placement.  -Patient admitted with frequent falls and inability to care for himself which may be related to polypharmacy in the setting of benzodiazepine use.  He is also noted to have right upper extremity pain with recent right humeral fracture.  Sling will be placed to right upper extremity.  PT evaluation currently pending.  We will follow a.m. labs.  Likely will need to discontinue Pradaxa in the near future if he still continues to be a high fall risk after rehab placement.    Assessment & Plan:   Principal Problem:   Frequent falls Active Problems:   Type 2 diabetes mellitus with stage 3 chronic kidney disease (HCC)   Paroxysmal atrial fibrillation (HCC)   Unable to care for self   Weakness   Weakness with  frequent falls -Fall precautions -PT evaluation pending with need for placement as patient is unable to care for self -TOC evaluation and recommendations appreciated  Type 2 diabetes -Currently controlled -Maintain on SSI  CKD stage III -Continue monitoring -Maintain on IV fluid for now  Paroxysmal atrial fibrillation -Stable, maintain on amiodarone and Pradaxa for now -Consider discontinuation of anticoagulation in the near future given recurrent falls  Right upper extremity pain secondary to humeral fracture -Arm in sling and will need orthopedic follow-up outpatient  Anxiety -Resumed Xanax twice daily as needed for anxiety  Hypothyroidism -Continue levothyroxine  Dyslipidemia -Hold Lipitor for now and check CK levels   DVT prophylaxis: Pradaxa Code Status: Full code Family Communication: Discussed with nephew on 6/5 Disposition Plan: PT evaluation with need for placement Status is: Inpatient  Remains inpatient appropriate because:Unsafe d/c plan   Dispo: The patient is from: Home              Anticipated d/c is to: SNF              Anticipated d/c date is: 2 days              Patient currently is not medically stable to d/c.   Consultants:   None  Procedures:   None  Antimicrobials:   None   Subjective: Patient seen and evaluated today with no new acute complaints or concerns. No acute concerns or events noted overnight.  He is awaiting PT evaluation  Objective: Vitals:   08/19/19 2028 08/20/19 0458 08/20/19 1223 08/20/19 1325  BP: (!) 115/56 Marland Kitchen)  124/54 111/70 124/60  Pulse: (!) 56 62 86 74  Resp: 16 16 18 18   Temp: 98 F (36.7 C) 98.2 F (36.8 C)  98 F (36.7 C)  TempSrc: Oral Oral  Oral  SpO2: 96% 97% 96% 95%  Weight:      Height:        Intake/Output Summary (Last 24 hours) at 08/20/2019 1402 Last data filed at 08/20/2019 0900 Gross per 24 hour  Intake 390 ml  Output 550 ml  Net -160 ml   Filed Weights   08/19/19 0100  Weight: 96  kg    Examination:  General exam: Appears calm and comfortable  Respiratory system: Clear to auscultation. Respiratory effort normal. Cardiovascular system: S1 & S2 heard, RRR. No JVD, murmurs, rubs, gallops or clicks. No pedal edema. Gastrointestinal system: Abdomen is nondistended, soft and nontender. No organomegaly or masses felt. Normal bowel sounds heard. Central nervous system: Alert and oriented. No focal neurological deficits. Extremities: Symmetric 5 x 5 power.  Right upper extremity in sling Skin: No rashes, lesions or ulcers Psychiatry: Judgement and insight appear normal. Mood & affect appropriate.     Data Reviewed: I have personally reviewed following labs and imaging studies  CBC: Recent Labs  Lab 08/15/19 1545 08/15/19 1545 08/16/19 0442 08/17/19 2004 08/18/19 1154 08/19/19 0641 08/20/19 0423  WBC 11.5*   < > 10.8* 11.8* 10.5 6.7 6.4  NEUTROABS 8.3*  --   --  9.0*  --   --   --   HGB 12.2*   < > 11.2* 10.4* 10.8* 9.7* 10.4*  HCT 37.6*   < > 34.3* 31.8* 33.0* 30.0* 31.7*  MCV 107.7*   < > 107.9* 108.5* 109.3* 106.8* 107.8*  PLT 220   < > 191 182 197 186 226   < > = values in this interval not displayed.   Basic Metabolic Panel: Recent Labs  Lab 08/16/19 0442 08/17/19 2004 08/18/19 1154 08/19/19 0641 08/20/19 0423  NA 138 137 140 141 140  K 4.4 3.8 3.9 3.9 3.7  CL 105 104 103 108 108  CO2 27 23 24 26 26   GLUCOSE 143* 153* 126* 107* 114*  BUN 27* 30* 23 19 24*  CREATININE 1.71* 1.59* 1.32* 1.19 1.37*  CALCIUM 8.2* 8.4* 8.6* 8.1* 8.4*   GFR: Estimated Creatinine Clearance: 55 mL/min (A) (by C-G formula based on SCr of 1.37 mg/dL (H)). Liver Function Tests: Recent Labs  Lab 08/15/19 1650 08/16/19 0442 08/18/19 1154  AST 64* 51* 52*  ALT 66* 59* 48*  ALKPHOS 66 62 64  BILITOT 1.0 1.0 1.7*  PROT 6.1* 6.0* 6.3*  ALBUMIN 2.8* 2.7* 2.8*   No results for input(s): LIPASE, AMYLASE in the last 168 hours. No results for input(s): AMMONIA in the  last 168 hours. Coagulation Profile: No results for input(s): INR, PROTIME in the last 168 hours. Cardiac Enzymes: Recent Labs  Lab 08/17/19 2004 08/18/19 1154  CKTOTAL 190 281   BNP (last 3 results) No results for input(s): PROBNP in the last 8760 hours. HbA1C: Recent Labs    08/18/19 1154  HGBA1C 6.0*   CBG: Recent Labs  Lab 08/19/19 1127 08/19/19 1620 08/19/19 2146 08/20/19 0713 08/20/19 1100  GLUCAP 118* 140* 110* 103* 126*   Lipid Profile: No results for input(s): CHOL, HDL, LDLCALC, TRIG, CHOLHDL, LDLDIRECT in the last 72 hours. Thyroid Function Tests: No results for input(s): TSH, T4TOTAL, FREET4, T3FREE, THYROIDAB in the last 72 hours. Anemia Panel: No results for input(s): VITAMINB12,  FOLATE, FERRITIN, TIBC, IRON, RETICCTPCT in the last 72 hours. Sepsis Labs: No results for input(s): PROCALCITON, LATICACIDVEN in the last 168 hours.  Recent Results (from the past 240 hour(s))  SARS Coronavirus 2 by RT PCR (hospital order, performed in [facility] hospital lab) Nasopharyngeal Nasopharyngeal Swab     Status: None   Collection Time: 08/15/19  7:00 PM   Specimen: Nasopharyngeal Swab  Result Value Ref Range Status   SARS Coronavirus 2 NEGATIVE NEGATIVE Final    Comment: (NOTE) SARS-CoV-2 target nucleic acids are NOT DETECTED. The SARS-CoV-2 RNA is generally detectable in upper and lower respiratory specimens during the acute phase of infection. The lowest concentration of SARS-CoV-2 viral copies this assay can detect is 250 copies / mL. A negative result does not preclude SARS-CoV-2 infection and should not be used as the sole basis for treatment or other patient management decisions.  A negative result may occur with improper specimen collection / handling, submission of specimen other than nasopharyngeal swab, presence of viral mutation(s) within the areas targeted by this assay, and inadequate number of viral copies (<250 copies / mL). A negative result  must be combined with clinical observations, patient history, and epidemiological information. Fact Sheet for Patients:   StrictlyIdeas.no Fact Sheet for Healthcare Providers: BankingDealers.co.za This test is not yet approved or cleared  by the [place] FDA and has been authorized for detection and/or diagnosis of SARS-CoV-2 by FDA under an Emergency Use Authorization (EUA).  This EUA will remain in effect (meaning this test can be used) for the duration of the COVID-19 declaration under Section 564(b)(1) of the Act, 21 U.S.C. section 360bbb-3(b)(1), unless the authorization is terminated or revoked sooner. Performed at [facility], [address] [place], [place] [address]   SARS Coronavirus 2 by RT PCR (hospital order, performed in [facility] hospital lab) Nasopharyngeal Nasopharyngeal Swab     Status: None   Collection Time: 08/18/19  9:11 PM   Specimen: Nasopharyngeal Swab  Result Value Ref Range Status   SARS Coronavirus 2 NEGATIVE NEGATIVE Final    Comment: (NOTE) SARS-CoV-2 target nucleic acids are NOT DETECTED. The SARS-CoV-2 RNA is generally detectable in upper and lower respiratory specimens during the acute phase of infection. The lowest concentration of SARS-CoV-2 viral copies this assay can detect is 250 copies / mL. A negative result does not preclude SARS-CoV-2 infection and should not be used as the sole basis for treatment or other patient management decisions.  A negative result may occur with improper specimen collection / handling, submission of specimen other than nasopharyngeal swab, presence of viral mutation(s) within the areas targeted by this assay, and inadequate number of viral copies (<250 copies / mL). A negative result must be combined with clinical observations, patient history, and epidemiological information. Fact Sheet for Patients:   StrictlyIdeas.no Fact Sheet for  Healthcare Providers: BankingDealers.co.za This test is not yet approved or cleared  by the [place] FDA and has been authorized for detection and/or diagnosis of SARS-CoV-2 by FDA under an Emergency Use Authorization (EUA).  This EUA will remain in effect (meaning this test can be used) for the duration of the COVID-19 declaration under Section 564(b)(1) of the Act, 21 U.S.C. section 360bbb-3(b)(1), unless the authorization is terminated or revoked sooner. Performed at [facility], [address], [place], [place] [address]          Radiology Studies: No results found.      Scheduled Meds: . amiodarone  200 mg Oral Daily  .  aspirin EC  81 mg Oral QHS  . atorvastatin  40 mg Oral QHS  . dabigatran  75 mg Oral Q12H  . ezetimibe  10 mg Oral Daily  . insulin aspart  0-5 Units Subcutaneous QHS  . insulin aspart  0-9 Units Subcutaneous TID WC  . levothyroxine  137 mcg Oral Q0600  . metoprolol tartrate  50 mg Oral BID  . ramipril  2.5 mg Oral Daily   Continuous Infusions: . lactated ringers 100 mL/hr at 08/20/19 1207     LOS: 1 day    Time spent: 30 minutes    Antwoin Lackey Darleen Crocker, DO [facility]  If 7PM-7AM, please contact night-coverage [email or web address] 08/20/2019, 2:02 PM

## 2019-08-20 NOTE — Plan of Care (Signed)

## 2019-08-20 NOTE — Progress Notes (Signed)
Heart rate in lower 50's notified Dr. Humphrey Rolls. Metoprolol held

## 2019-08-20 NOTE — Progress Notes (Signed)
Pt assisted OOB to bedside commode using gait belt. Pt with moderate leg strength, able to stand from bed with only minimal assistance. Gait small shuffling steps but stable. Pt had large, formed brown BM. Pt then stood and took small steps to recliner. Had to verbally remind pt to stand upright and keep head up, but did well with only standby assistance. Pt currently up in recliner feeding self lunch. Denies c/o. Sling remains in place on right arm, pt has elbow & forearm on pillow.

## 2019-08-21 LAB — GLUCOSE, CAPILLARY
Glucose-Capillary: 81 mg/dL (ref 70–99)
Glucose-Capillary: 96 mg/dL (ref 70–99)
Glucose-Capillary: 110 mg/dL — ABNORMAL HIGH (ref 70–99)
Glucose-Capillary: 94 mg/dL (ref 70–99)

## 2019-08-21 LAB — BASIC METABOLIC PANEL
Anion gap: 6 (ref 5–15)
BUN: 29 mg/dL — ABNORMAL HIGH (ref 8–23)
CO2: 26 mmol/L (ref 22–32)
Calcium: 8.2 mg/dL — ABNORMAL LOW (ref 8.9–10.3)
Chloride: 106 mmol/L (ref 98–111)
Creatinine, Ser: 1.34 mg/dL — ABNORMAL HIGH (ref 0.61–1.24)
GFR calc Af Amer: >60 mL/min (ref >60)
GFR calc non Af Amer: 52 mL/min — ABNORMAL LOW (ref >60)
Glucose, Bld: 90 mg/dL (ref 70–99)
Potassium: 3.6 mmol/L (ref 3.5–5.1)
Sodium: 138 mmol/L (ref 135–145)

## 2019-08-21 MED ORDER — LOPERAMIDE HCL 2 MG PO CAPS: 2.0000 mg | ORAL_CAPSULE | ORAL | Status: DC | PRN

## 2019-08-21 NOTE — Evaluation (Signed)
Occupational Therapy Evaluation Patient Details Name: Alan Orr MRN: [id number] DOB: [birth year] Today's Date: 08/21/2019    History of Present Illness 74 y.o. male presenting to ED via EMS after being found on floor by family; pt with several recent falls and ED visits. Last ED visit on 08/15/19, pt s/p fall and sustained R proximal humorous fx; placed in shoulder immobilized and dc to home. Awaiting ortho follow up. PMH including CAD, ischemic cardiomyopathy,  remote bypass surgery, paroxysmal a-fib on Pradaxa, DVT, hypertension, V. tach s/p biventricular ICD, hypothyroidism, HTN, and hyperlipidemia.    Clinical Impression   PTA, pt was living with alone and reports he was independent with ADLs and IADLs. Pt currently requiring Mod-Max A for UB ADLs, Mod A for LB ADLs, and Min Guard-Min A for functional transfers. Pt presenting with decreased strength, balance, functional use of RUE, and activity tolerance. Pt's RUE with increased edema and purple coloring; notified DO. Pt also with bowel incontinence (liquid , green bowel) throughout session; notified RN and DO. Pt would benefit from further acute OT to facilitate safe dc. Recommend dc to SNF for further OT to optimize safety, independence with ADLs, and return to PLOF.      Follow Up Recommendations  SNF    Equipment Recommendations  Other (comment)(defer to next venue)    Recommendations for Other Services PT consult     Precautions / Restrictions Precautions Precautions: Fall Restrictions Weight Bearing Restrictions: No      Mobility Bed Mobility Overal bed mobility: Needs Assistance Bed Mobility: Supine to Sit     Supine to sit: Mod assist;HOB elevated     General bed mobility comments: Mod A to elevate trunk and then bring hips towards EOB with bed pad.  Transfers Overall transfer level: Needs assistance Equipment used: Straight cane Transfers: Sit to/from [facility] Sit to Stand: Min  assist Stand pivot transfers: Min guard       General transfer comment: Pt requiring Min A to power up into standing from lower surface. Min Guard A during pivot to [facility] and recliner    Balance Overall balance assessment: Needs assistance Sitting-balance support: Feet supported;No upper extremity supported Sitting balance-Leahy Scale: Good Sitting balance - Comments: seated at EOB   Standing balance support: During functional activity;No upper extremity supported Standing balance-Leahy Scale: Fair Standing balance comment: maintaining standing duirng peri care                           ADL either performed or assessed with clinical judgement   ADL Overall ADL's : Needs assistance/impaired Eating/Feeding: Set up;Sitting   Grooming: Wash/dry hands;Set up;Supervision/safety;Sitting   Upper Body Bathing: Moderate assistance;Sitting   Lower Body Bathing: Moderate assistance;Sit to/from stand   Upper Body Dressing : Maximal assistance;Sitting Upper Body Dressing Details (indicate cue type and reason): Max A for donning sling. Limited movement at RUE and assume no RO at this time until ortho consult Lower Body Dressing: Moderate assistance;Sit to/from stand Lower Body Dressing Details (indicate cue type and reason): Initating doning of socks. Pt then able to use figure four method to being socks over heel Toilet Transfer: Minimal assistance;Ambulation;BSC Toilet Transfer Details (indicate cue type and reason): Min A for balance and safety. Requring Min A for power up from low surface at [place]- Clothing Manipulation and Hygiene: Minimal assistance;Sit to/from stand Toileting - Clothing Manipulation Details (indicate cue type and reason): Pt able to perform peri care with LUE and  requiring occasional Min A for balance.     Functional mobility during ADLs: Minimal assistance General ADL Comments: Pt presenting with decreased functional use of RUE, balance, strength,  and activity tolerance     Vision Baseline Vision/History: Wears glasses Wears Glasses: At all times Patient Visual Report: No change from baseline       Perception     Praxis      Pertinent Vitals/Pain Pain Assessment: Faces Faces Pain Scale: Hurts even more Pain Location: RUE with movement Pain Descriptors / Indicators: Grimacing;Guarding;Sore Pain Intervention(s): Monitored during session;Limited activity within patient's tolerance;Repositioned     Hand Dominance Right   Extremity/Trunk Assessment Upper Extremity Assessment Upper Extremity Assessment: RUE deficits/detail RUE Deficits / Details: R proxmial fx. In sling. Significant edema and purple coloring. Notified DO. Limited AROM at hand, wrist, and elbow. Assume NWB and limited PROM until ortho follow up.  RUE: Unable to fully assess due to pain;Unable to fully assess due to immobilization RUE Coordination: decreased fine motor;decreased gross motor   Lower Extremity Assessment Lower Extremity Assessment: Defer to PT evaluation   Cervical / Trunk Assessment Cervical / Trunk Assessment: Normal   Communication Communication Communication: HOH   Cognition Arousal/Alertness: Awake/alert Behavior During Therapy: WFL for tasks assessed/performed Overall Cognitive Status: Within Functional Limits for tasks assessed                                     General Comments  VSS. liquid, green diarrhea  during session and incontience. Notified RN and DO.     Exercises Exercises: General Upper Extremity General Exercises - Upper Extremity Shoulder Flexion: PROM;Right;10 reps;Supine;Limitations Shoulder Flexion Limitations: Limited within pain Elbow Flexion: PROM;Right;10 reps;Supine Elbow Extension: PROM;Right;10 reps;Supine Wrist Flexion: PROM;Right;10 reps;Supine Wrist Extension: PROM;Right;10 reps;Supine   Shoulder Instructions      Home Living Family/patient expects to be discharged to:: Private  residence Living Arrangements: Alone Available Help at Discharge: Family;Available PRN/intermittently;Friend(s)(Nephew checks in each day) Type of Home: Apartment Home Access: Stairs to enter Entrance Stairs-Number of Steps: 1 Entrance Stairs-Rails: None Home Layout: One level     Bathroom Shower/Tub: Teacher, early years/pre: Standard Bathroom Accessibility: Yes   Home Equipment: Environmental consultant - 2 wheels;Cane - single point          Prior Functioning/Environment Level of Independence: Independent        Comments: ADLs, IADLs, and driving        OT Problem List: Decreased strength;Decreased range of motion;Decreased activity tolerance;Impaired balance (sitting and/or standing);Decreased safety awareness;Decreased knowledge of use of DME or AE;Decreased knowledge of precautions;Pain;Impaired UE functional use      OT Treatment/Interventions: Self-care/ADL training;Therapeutic exercise;Energy conservation;DME and/or AE instruction;Therapeutic activities;Patient/family education    OT Goals(Current goals can be found in the care plan section) Acute Rehab OT Goals Patient Stated Goal: To get better and go home; agreeable to consider post-acute rehab OT Goal Formulation: With patient Time For Goal Achievement: 09/04/19 Potential to Achieve Goals: Good  OT Frequency: Min 2X/week   Barriers to D/C:            Co-evaluation              AM-PAC OT "6 Clicks" Daily Activity     Outcome Measure Help from another person eating meals?: A Little Help from another person taking care of personal grooming?: A Little Help from another person toileting, which includes using toliet, bedpan, or  urinal?: A Little Help from another person bathing (including washing, rinsing, drying)?: A Lot Help from another person to put on and taking off regular upper body clothing?: A Lot Help from another person to put on and taking off regular lower body clothing?: A Lot 6 Click Score:  15   End of Session Nurse Communication: Mobility status  Activity Tolerance: Patient tolerated treatment well Patient left: in chair;with call bell/phone within reach;with chair alarm set  OT Visit Diagnosis: Unsteadiness on feet (R26.81);Other abnormalities of gait and mobility (R26.89);Muscle weakness (generalized) (M62.81);Pain Pain - Right/Left: Right Pain - part of body: Shoulder;Arm                Time: [id number] OT Time Calculation (min): 41 min Charges:  OT General Charges $OT Visit: 1 Visit OT Evaluation $OT Eval Moderate Complexity: 1 Mod OT Treatments $Self Care/Home Management : 23-37 mins  Merik Mignano MSOT, OTR/L Acute Rehab Pager: [phone] Office: [place] 08/21/2019, 12:22 PM

## 2019-08-21 NOTE — Evaluation (Signed)
Physical Therapy Evaluation Patient Details Name: Alan Orr MRN: [id number] DOB: 08-27-1945 Today's Date: 08/21/2019   History of Present Illness  74 y.o. male presenting to ED via EMS after being found on floor by family; pt with several recent falls and ED visits. Last ED visit on 08/15/19, pt s/p fall and sustained R proximal humorous fx; placed in shoulder immobilized and dc to home. Awaiting ortho follow up. PMH including CAD, ischemic cardiomyopathy,  remote bypass surgery, paroxysmal a-fib on Pradaxa, DVT, hypertension, V. tach s/p biventricular ICD, hypothyroidism, HTN, and hyperlipidemia.     Clinical Impression  Pt admitted with above diagnosis.  Pt currently with functional limitations due to the deficits listed below (see PT Problem List). Pt will benefit from skilled PT to increase their independence and safety with mobility to allow discharge to the venue listed below.       Follow Up Recommendations SNF;Supervision for mobility/OOB;Supervision - Intermittent    Equipment Recommendations       Recommendations for Other Services       Precautions / Restrictions Precautions Precautions: Fall Required Braces or Orthoses: Sling Restrictions Weight Bearing Restrictions: No      Mobility  Bed Mobility Overal bed mobility: Needs Assistance Bed Mobility: Supine to Sit;Sit to Supine     Supine to sit: Mod assist;HOB elevated Sit to supine: Min assist   General bed mobility comments: Mod A for right upper extremity/trunk supine to sit; min A for lower extremities sit to supine  Transfers Overall transfer level: Needs assistance Equipment used: Straight cane Transfers: Sit to/from [facility] Sit to Stand: Min assist Stand pivot transfers: Min guard       General transfer comment: Pt requiring Min A and multiple attempts to power up into standing from bed. Min Guard A during pivot to back to bed  Ambulation/Gait Ambulation/Gait assistance:  Min guard Gait Distance (Feet): 30 Feet Assistive device: Straight cane Gait Pattern/deviations: Step-through pattern;Decreased step length - right;Decreased step length - left;Decreased stride length;Wide base of support Gait velocity: decreased   General Gait Details: slow, labored gait, somewhat unsteady with SPC, right arm in sling; limited by fatigue.  Stairs            Wheelchair Mobility    Modified Rankin (Stroke Patients Only)       Balance Overall balance assessment: Needs assistance Sitting-balance support: Feet supported;No upper extremity supported Sitting balance-Leahy Scale: Good Sitting balance - Comments: seated at EOB   Standing balance support: During functional activity;Single extremity supported Standing balance-Leahy Scale: Fair Standing balance comment: using SPC                             Pertinent Vitals/Pain Pain Assessment: 0-10 Pain Score: 8  Faces Pain Scale: Hurts even more Pain Location: right shoulder Pain Descriptors / Indicators: Dull;Aching(intermittent) Pain Intervention(s): Limited activity within patient's tolerance;Monitored during session    [place] expects to be discharged to:: Private residence Living Arrangements: Alone Available Help at Discharge: Family;Available PRN/intermittently;Friend(s)(Nephew checks in each day) Type of Home: Apartment Home Access: Stairs to enter Entrance Stairs-Rails: None Entrance Stairs-Number of Steps: 1 Home Layout: One level Home Equipment: Walker - 2 wheels;Cane - single point;Shower seat      Prior Function Level of Independence: Independent         Comments: ADLs, IADLs, and driving; at times nephew will do grocery shopping for patient     Hand Dominance  Dominant Hand: Right    Extremity/Trunk Assessment   Upper Extremity Assessment Upper Extremity Assessment: Defer to OT evaluation RUE Deficits / Details: R proxmial fx. In sling.  Significant edema and purple coloring. Notified DO. Limited AROM at hand, wrist, and elbow. Assume NWB and limited PROM until ortho follow up.  RUE: Unable to fully assess due to pain;Unable to fully assess due to immobilization RUE Coordination: decreased fine motor;decreased gross motor    Lower Extremity Assessment Lower Extremity Assessment: Generalized weakness    Cervical / Trunk Assessment Cervical / Trunk Assessment: Normal  Communication   Communication: HOH  Cognition Arousal/Alertness: Awake/alert Behavior During Therapy: WFL for tasks assessed/performed Overall Cognitive Status: Within Functional Limits for tasks assessed                                        General Comments General comments (skin integrity, edema, etc.):    Exercises General Exercises - Upper Extremity Shoulder Flexion: PROM;Right;10 reps;Supine;Limitations Shoulder Flexion Limitations: Limited within pain Elbow Flexion: PROM;Right;10 reps;Supine Elbow Extension: PROM;Right;10 reps;Supine Wrist Flexion: PROM;Right;10 reps;Supine Wrist Extension: PROM;Right;10 reps;Supine   Assessment/Plan    PT Assessment Patient needs continued PT services  PT Problem List Decreased strength;Decreased activity tolerance;Decreased balance;Decreased mobility;Pain;Decreased knowledge of use of DME       PT Treatment Interventions Balance training;Gait training;Functional mobility training;Therapeutic activities;Patient/family education;Therapeutic exercise    PT Goals (Current goals can be found in the Care Plan section)  Acute Rehab PT Goals Patient Stated Goal: To get better and go home; agreeable to consider post-acute rehab PT Goal Formulation: With patient Time For Goal Achievement: 09/04/19 Potential to Achieve Goals: Fair    Frequency Min 2X/week   Barriers to discharge        Co-evaluation               AM-PAC PT "6 Clicks" Mobility  Outcome Measure Help needed turning  from your back to your side while in a flat bed without using bedrails?: A Lot Help needed moving from lying on your back to sitting on the side of a flat bed without using bedrails?: A Lot Help needed moving to and from a bed to a chair (including a wheelchair)?: A Little Help needed standing up from a chair using your arms (e.g., wheelchair or bedside chair)?: A Little Help needed to walk in hospital room?: A Little Help needed climbing 3-5 steps with a railing? : A Lot 6 Click Score: 15    End of Session Equipment Utilized During Treatment: Gait belt Activity Tolerance: Patient tolerated treatment well;Patient limited by fatigue Patient left: with call bell/phone within reach;in bed;with bed alarm set Nurse Communication: Mobility status PT Visit Diagnosis: Unsteadiness on feet (R26.81);Other abnormalities of gait and mobility (R26.89);Muscle weakness (generalized) (M62.81);History of falling (Z91.81)    Time: 4709-6283 PT Time Calculation (min) (ACUTE ONLY): 30 min   Charges:   PT Evaluation $PT Eval Low Complexity: 1 Low PT Treatments $Gait Training: 8-22 mins        Floria Raveling. Hartnett-Rands, MS, PT Per [place] [phone] 08/21/2019, 1:55 PM

## 2019-08-21 NOTE — NC FL2 (Signed)
[place] LEVEL OF CARE SCREENING TOOL     IDENTIFICATION  Patient Name: Alan Orr Birthdate: 19-Dec-1945 Sex: male Admission Date (Current Location): 08/18/2019  [facility] and [place] Number:      Facility and Address:         Provider Number:    Attending Physician Name and Address:  Rodena Goldmann, DO  Relative Name and Phone Number:       Current Level of Care:   Recommended Level of Care:   Prior Approval Number:    Date Approved/Denied:   PASRR Number:    Discharge Plan:      Current Diagnoses: Patient Active Problem List   Diagnosis Date Noted  . Unable to care for self 08/19/2019  . Weakness 08/19/2019  . Frequent falls 08/18/2019  . AKI (acute kidney injury) ([place]) 08/15/2019  . Cellulitis of right lower extremity 06/21/2018  . Salmonella sepsis ([place]) 10/21/2016  . Salmonella gastroenteritis 10/20/2016  . Transaminasemia 10/18/2016  . Pancytopenia ([place]) 10/18/2016  . DM type 2 (diabetes mellitus, type 2) ([employer]) 10/18/2016  . Thrombocytopenia ([place]) 08/22/2016  . Tick bite of right thigh 08/22/2016  . Abnormal serum protein electrophoresis 08/21/2014  . Leg wound, left 11/03/2013  . History of carotid artery disease 03/03/2013  . Cardiomyopathy, ischemic 11/08/2012  . CAD s/p CABG  11/08/2012  . Hyperlipidemia 11/08/2012  . Paroxysmal atrial fibrillation ([place]) 11/08/2012  . Ventricular tachycardia ([place]) 11/08/2012  . Type 2 diabetes mellitus with stage 3 chronic kidney disease ([place]) 08/20/2009  . PACEMAKER, PERMANENT 08/20/2009  . Biventricular ICD (implantable cardioverter-defibrillator) in place 08/20/2009    Orientation RESPIRATION BLADDER Height & Weight            Weight: 211 lb 10.3 oz (96 kg) Height:  6\' 2"  (188 cm)  BEHAVIORAL SYMPTOMS/MOOD NEUROLOGICAL BOWEL NUTRITION STATUS           AMBULATORY STATUS COMMUNICATION OF NEEDS Skin                               Personal Care Assistance Level of Assistance               Functional Limitations Info             SPECIAL CARE FACTORS FREQUENCY  OT (By licensed OT)       OT Frequency: 3 times weekly            Contractures      Additional Factors Info  Psychotropic     Psychotropic Info: Xanax         Current Medications (08/21/2019):  This is the current hospital active medication list Current Facility-Administered Medications  Medication Dose Route Frequency Provider Last Rate Last Admin  . acetaminophen (TYLENOL) tablet 650 mg  650 mg Oral Q6H PRN Bunnie Pion Z, DO       Or  . acetaminophen (TYLENOL) suppository 650 mg  650 mg Rectal Q6H PRN Bunnie Pion Z, DO      . ALPRAZolam Duanne Moron) tablet 1 mg  1 mg Oral BID PRN Manuella Ghazi, Pratik D, DO      . amiodarone (PACERONE) tablet 200 mg  200 mg Oral Daily Bunnie Pion Z, DO   200 mg at 08/21/19 0753  . aspirin EC tablet 81 mg  81 mg Oral QHS Bunnie Pion Z, DO   81 mg at 08/20/19 2127  . dabigatran (PRADAXA) capsule 75 mg  75 mg Oral Q12H Bunnie Pion Z, DO   75 mg at 08/21/19 0749  . ezetimibe (ZETIA) tablet 10 mg  10 mg Oral Daily Bunnie Pion Z, DO   10 mg at 08/21/19 0750  . insulin aspart (novoLOG) injection 0-5 Units  0-5 Units Subcutaneous QHS Bunnie Pion Z, DO      . insulin aspart (novoLOG) injection 0-9 Units  0-9 Units Subcutaneous TID WC Bunnie Pion Z, DO   1 Units at 08/20/19 1209  . ketorolac (TORADOL) 15 MG/ML injection 15 mg  15 mg Intravenous Q6H PRN Manuella Ghazi, Pratik D, DO   15 mg at 08/20/19 0631  . lactated ringers infusion   Intravenous Continuous Heath Lark D, DO 100 mL/hr at 08/20/19 2125 New Bag at 08/20/19 2125  . levothyroxine (SYNTHROID) tablet 137 mcg  137 mcg Oral Q0600 Bunnie Pion Z, DO   137 mcg at 08/21/19 3149  . loperamide (IMODIUM) capsule 2 mg  2 mg Oral PRN Manuella Ghazi, Pratik D, DO      . metoprolol tartrate (LOPRESSOR) tablet 50 mg  50 mg Oral BID Bunnie Pion Z, DO   50 mg at 08/21/19 0753  . nitroGLYCERIN (NITROSTAT) SL tablet 0.4  mg  0.4 mg Sublingual Q5 Min x 3 PRN Bunnie Pion Z, DO      . ondansetron [facility]) tablet 4 mg  4 mg Oral Q6H PRN Bunnie Pion Z, DO       Or  . ondansetron [facility]) injection 4 mg  4 mg Intravenous Q6H PRN Bunnie Pion Z, DO      . oxyCODONE (Oxy IR/ROXICODONE) immediate release tablet 10 mg  10 mg Oral Q6H PRN Manuella Ghazi, Pratik D, DO   10 mg at 08/19/19 1614  . ramipril (ALTACE) capsule 2.5 mg  2.5 mg Oral Daily Bunnie Pion Z, DO   2.5 mg at 08/21/19 7026     Discharge Medications: Please see discharge summary for a list of discharge medications.  Relevant Imaging Results:  Relevant Lab Results:   Additional Information SSN: [id number]  Pt has not had COIVD vaccines  Shade Flood, LCSW

## 2019-08-21 NOTE — TOC Progression Note (Signed)
Transition of Care [facility]) - Progression Note    Patient Details  Name: Alan Orr MRN: [id number] Date of Birth: Jan 04, 1946  Transition of Care [facility]) CM/SW Contact  Shade Flood, LCSW Phone Number: 08/21/2019, 2:21 PM  Clinical Narrative:     PT/OT recommending SNF rehab. Spoke with pt's nephew, Richardson Landry, to update. Richardson Landry states that he and pt are in agreement with referrals for rehab. Discussed CMS provider options and will refer as requested. TOC will follow and assist with dc planning.   Expected Discharge Plan: Skilled Nursing Facility Barriers to Discharge: Continued Medical Work up  Expected Discharge Plan and Services Expected Discharge Plan: [place]                                               Social Determinants of Health (SDOH) Interventions    Readmission Risk Interventions Readmission Risk Prevention Plan 08/21/2019  Transportation Screening Complete  Medication Review (RN CM) Complete  Some recent data might be hidden

## 2019-08-21 NOTE — Progress Notes (Signed)
Patient has had two episodes of watery, dark green diarrhea. Dr. Manuella Ghazi notified, new orders placed.

## 2019-08-21 NOTE — Plan of Care (Signed)
°  Problem: Acute Rehab PT Goals(only PT should resolve) Goal: Pt will Roll Supine to Side Outcome: Progressing Flowsheets (Taken 08/21/2019 1357) Pt will Roll Supine to Side: with min assist Goal: Pt Will Go Supine/Side To Sit Outcome: Progressing Flowsheets (Taken 08/21/2019 1357) Pt will go Supine/Side to Sit: with minimal assist Goal: Pt Will Go Sit To Supine/Side Outcome: Progressing Flowsheets (Taken 08/21/2019 1357) Pt will go Sit to Supine/Side: with minimal assist Goal: Patient Will Transfer Sit To/From Stand Outcome: Progressing Flowsheets (Taken 08/21/2019 1357) Patient will transfer sit to/from stand: with minimal assist Goal: Pt Will Transfer Bed To Chair/Chair To Bed Outcome: Progressing Flowsheets (Taken 08/21/2019 1357) Pt will Transfer Bed to Chair/Chair to Bed: min guard assist Goal: Pt Will Ambulate Outcome: Progressing Flowsheets (Taken 08/21/2019 1357) Pt will Ambulate:  50 feet  with least restrictive assistive device   Pamala Hurry D. Hartnett-Rands, MS, PT Per [place] [phone] 08/21/2019

## 2019-08-21 NOTE — Progress Notes (Signed)
PROGRESS NOTE    Alan Orr  IOE:[id number] DOB: 1945-12-03 DOA: 08/18/2019 PCP: [name], MD   Brief Narrative:  Per HPI: Alan Orr a 74 y.o.malewith medical history significant ofwithhypertension, paroxysmalatrial fibrillation on Pradaxa, coronary artery disease s/p CABG, history of ventricular tachycardia with pacer/defibrillator and chronic pain syndrome on opiates and benzodiazepine is brought to ED for evaluation of fall. During my evaluation patient is awake and alert but not oriented to place person time and situation. Patient does not know why he is brought to the hospital today. According to the ED physician patient was brought by EMS after he was found on the floor by the family and patient recently had multiple falls in the last 4 to 5 days. Patient also broke his right arm. Patient lives alone by himself and his nephew visits him every day. Patient is complaining of mild pain in right upper extremity but denies fever, headache, dizziness, chest pain, shortness of breath, nausea, vomiting, abdominal pain and urinary symptoms. Looking at patient's condition it does not look saferfor him to live alone at home and he need nursing home placement.  -Patient admitted with frequent falls and inability to care for himself which may be related to polypharmacy in the setting of benzodiazepine use. He is also noted to have right upper extremity pain with recent right humeral fracture. Sling will be placed to right upper extremity. PT evaluation currently pending. We will follow a.m. labs. Likely will need to discontinue Pradaxa in the near future if he still continues to be a high fall risk after rehab placement.   Assessment & Plan:   Principal Problem:   Frequent falls Active Problems:   Type 2 diabetes mellitus with stage 3 chronic kidney disease (HCC)   Paroxysmal atrial fibrillation (HCC)   Unable to care for self   Weakness   Weakness with  frequent falls -Fall precautions -PT evaluation pending with need for placement as patient is unable to care for self -TOC evaluation and recommendations appreciated  Type 2 diabetes -Currently controlled -Maintain on SSI  New onset diarrhea -Denies abdominal pain -May be related to some opioid withdrawal -Follow GI panel -Imodium as needed  CKD stage III -Continue monitoring -Maintain on IV fluid for now  Paroxysmal atrial fibrillation -Stable, maintain on amiodarone and Pradaxa for now -Consider discontinuation of anticoagulation in the near future given recurrent falls  Right upper extremity pain secondary to humeral fracture -Arm in sling and will need orthopedic follow-up outpatient -No significant pain noted  Anxiety -Resumed Xanax twice daily as needed for anxiety  Hypothyroidism -Continue levothyroxine  Dyslipidemia -Resume Lipitor as CK level is normal   DVT prophylaxis: Pradaxa Code Status: Full code Family Communication: Discussed with nephew on 6/5 Disposition Plan: PT evaluation with need for placement Status is: Inpatient  Remains inpatient appropriate because:Unsafe d/c plan   Dispo: The patient is from: Home  Anticipated d/c is to: SNF  Anticipated d/c date is: 1-2 days  Patient currently is not medically stable to d/c.  He is noted to have some diarrhea which will need further evaluation.  PT/OT evaluation still pending.   Consultants:   Plan to follow-up with orthopedics Dr. Aline Brochure outpatient for humeral fracture.  Procedures:   None  Antimicrobials:   None  Subjective: Patient seen and evaluated today with some new onset diarrhea noted.  No abdominal pain or cramping or recent antibiotics.  Objective: Vitals:   08/20/19 2123 08/21/19 0500 08/21/19 0753 08/21/19 0755  BP:  116/63 (!) 144/62 (!) 135/57   Pulse: (!) 51 (!) 53 63   Resp: 18 16    Temp: 97.7 F (36.5 C) 98.8 F  (37.1 C)    TempSrc: Oral Oral    SpO2: 98% 96%  96%  Weight:      Height:        Intake/Output Summary (Last 24 hours) at 08/21/2019 1159 Last data filed at 08/21/2019 0900 Gross per 24 hour  Intake 2436.01 ml  Output 550 ml  Net 1886.01 ml   Filed Weights   08/19/19 0100  Weight: 96 kg    Examination:  General exam: Appears calm and comfortable  Respiratory system: Clear to auscultation. Respiratory effort normal. Cardiovascular system: S1 & S2 heard, RRR. No JVD, murmurs, rubs, gallops or clicks. No pedal edema. Gastrointestinal system: Abdomen is nondistended, soft and nontender. No organomegaly or masses felt. Normal bowel sounds heard. Central nervous system: Alert and oriented. No focal neurological deficits. Extremities: Symmetric 5 x 5 power.  Right arm swollen, but in sling Skin: No rashes, lesions or ulcers Psychiatry: Judgement and insight appear normal. Mood & affect appropriate.     Data Reviewed: I have personally reviewed following labs and imaging studies  CBC: Recent Labs  Lab 08/15/19 1545 08/15/19 1545 08/16/19 0442 08/17/19 2004 08/18/19 1154 08/19/19 0641 08/20/19 0423  WBC 11.5*   < > 10.8* 11.8* 10.5 6.7 6.4  NEUTROABS 8.3*  --   --  9.0*  --   --   --   HGB 12.2*   < > 11.2* 10.4* 10.8* 9.7* 10.4*  HCT 37.6*   < > 34.3* 31.8* 33.0* 30.0* 31.7*  MCV 107.7*   < > 107.9* 108.5* 109.3* 106.8* 107.8*  PLT 220   < > 191 182 197 186 226   < > = values in this interval not displayed.   Basic Metabolic Panel: Recent Labs  Lab 08/17/19 2004 08/18/19 1154 08/19/19 0641 08/20/19 0423 08/21/19 0516  NA 137 140 141 140 138  K 3.8 3.9 3.9 3.7 3.6  CL 104 103 108 108 106  CO2 23 24 26 26 26   GLUCOSE 153* 126* 107* 114* 90  BUN 30* 23 19 24* 29*  CREATININE 1.59* 1.32* 1.19 1.37* 1.34*  CALCIUM 8.4* 8.6* 8.1* 8.4* 8.2*   GFR: Estimated Creatinine Clearance: 56.2 mL/min (A) (by C-G formula based on SCr of 1.34 mg/dL (H)). Liver Function  Tests: Recent Labs  Lab 08/15/19 1650 08/16/19 0442 08/18/19 1154  AST 64* 51* 52*  ALT 66* 59* 48*  ALKPHOS 66 62 64  BILITOT 1.0 1.0 1.7*  PROT 6.1* 6.0* 6.3*  ALBUMIN 2.8* 2.7* 2.8*   No results for input(s): LIPASE, AMYLASE in the last 168 hours. No results for input(s): AMMONIA in the last 168 hours. Coagulation Profile: No results for input(s): INR, PROTIME in the last 168 hours. Cardiac Enzymes: Recent Labs  Lab 08/17/19 2004 08/18/19 1154 08/20/19 1519  CKTOTAL 190 281 108   BNP (last 3 results) No results for input(s): PROBNP in the last 8760 hours. HbA1C: No results for input(s): HGBA1C in the last 72 hours. CBG: Recent Labs  Lab 08/20/19 1100 08/20/19 1611 08/20/19 2126 08/21/19 0747 08/21/19 1141  GLUCAP 126* 120* 119* 81 96   Lipid Profile: No results for input(s): CHOL, HDL, LDLCALC, TRIG, CHOLHDL, LDLDIRECT in the last 72 hours. Thyroid Function Tests: No results for input(s): TSH, T4TOTAL, FREET4, T3FREE, THYROIDAB in the last 72 hours. Anemia Panel: No results  for input(s): VITAMINB12, FOLATE, FERRITIN, TIBC, IRON, RETICCTPCT in the last 72 hours. Sepsis Labs: No results for input(s): PROCALCITON, LATICACIDVEN in the last 168 hours.  Recent Results (from the past 240 hour(s))  SARS Coronavirus 2 by RT PCR (hospital order, performed in [facility] hospital lab) Nasopharyngeal Nasopharyngeal Swab     Status: None   Collection Time: 08/15/19  7:00 PM   Specimen: Nasopharyngeal Swab  Result Value Ref Range Status   SARS Coronavirus 2 NEGATIVE NEGATIVE Final    Comment: (NOTE) SARS-CoV-2 target nucleic acids are NOT DETECTED. The SARS-CoV-2 RNA is generally detectable in upper and lower respiratory specimens during the acute phase of infection. The lowest concentration of SARS-CoV-2 viral copies this assay can detect is 250 copies / mL. A negative result does not preclude SARS-CoV-2 infection and should not be used as the sole basis for  treatment or other patient management decisions.  A negative result may occur with improper specimen collection / handling, submission of specimen other than nasopharyngeal swab, presence of viral mutation(s) within the areas targeted by this assay, and inadequate number of viral copies (<250 copies / mL). A negative result must be combined with clinical observations, patient history, and epidemiological information. Fact Sheet for Patients:   StrictlyIdeas.no Fact Sheet for Healthcare Providers: BankingDealers.co.za This test is not yet approved or cleared  by the [place] FDA and has been authorized for detection and/or diagnosis of SARS-CoV-2 by FDA under an Emergency Use Authorization (EUA).  This EUA will remain in effect (meaning this test can be used) for the duration of the COVID-19 declaration under Section 564(b)(1) of the Act, 21 U.S.C. section 360bbb-3(b)(1), unless the authorization is terminated or revoked sooner. Performed at [facility], [address], [place], [place] [address]   SARS Coronavirus 2 by RT PCR (hospital order, performed in [facility] hospital lab) Nasopharyngeal Nasopharyngeal Swab     Status: None   Collection Time: 08/18/19  9:11 PM   Specimen: Nasopharyngeal Swab  Result Value Ref Range Status   SARS Coronavirus 2 NEGATIVE NEGATIVE Final    Comment: (NOTE) SARS-CoV-2 target nucleic acids are NOT DETECTED. The SARS-CoV-2 RNA is generally detectable in upper and lower respiratory specimens during the acute phase of infection. The lowest concentration of SARS-CoV-2 viral copies this assay can detect is 250 copies / mL. A negative result does not preclude SARS-CoV-2 infection and should not be used as the sole basis for treatment or other patient management decisions.  A negative result may occur with improper specimen collection / handling, submission of specimen other than nasopharyngeal swab,  presence of viral mutation(s) within the areas targeted by this assay, and inadequate number of viral copies (<250 copies / mL). A negative result must be combined with clinical observations, patient history, and epidemiological information. Fact Sheet for Patients:   StrictlyIdeas.no Fact Sheet for Healthcare Providers: BankingDealers.co.za This test is not yet approved or cleared  by the [place] FDA and has been authorized for detection and/or diagnosis of SARS-CoV-2 by FDA under an Emergency Use Authorization (EUA).  This EUA will remain in effect (meaning this test can be used) for the duration of the COVID-19 declaration under Section 564(b)(1) of the Act, 21 U.S.C. section 360bbb-3(b)(1), unless the authorization is terminated or revoked sooner. Performed at [facility], [address], [place], [place] [address]          Radiology Studies: No results found.      Scheduled Meds: . amiodarone  200  mg Oral Daily  . aspirin EC  81 mg Oral QHS  . dabigatran  75 mg Oral Q12H  . ezetimibe  10 mg Oral Daily  . insulin aspart  0-5 Units Subcutaneous QHS  . insulin aspart  0-9 Units Subcutaneous TID WC  . levothyroxine  137 mcg Oral Q0600  . metoprolol tartrate  50 mg Oral BID  . ramipril  2.5 mg Oral Daily   Continuous Infusions: . lactated ringers 100 mL/hr at 08/20/19 2125     LOS: 2 days    Time spent: 30 minutes    Anahli Arvanitis Darleen Crocker, DO [facility]  If 7PM-7AM, please contact night-coverage [email or web address] 08/21/2019, 11:59 AM

## 2019-08-22 LAB — CBC
HCT: 28.9 % — ABNORMAL LOW (ref 39.0–52.0)
Hemoglobin: 9.6 g/dL — ABNORMAL LOW (ref 13.0–17.0)
MCH: 35 pg — ABNORMAL HIGH (ref 26.0–34.0)
MCHC: 33.2 g/dL (ref 30.0–36.0)
MCV: 105.5 fL — ABNORMAL HIGH (ref 80.0–100.0)
Platelets: 276 10*3/uL (ref 150–400)
RBC: 2.74 MIL/uL — ABNORMAL LOW (ref 4.22–5.81)
RDW: 14.7 % (ref 11.5–15.5)
WBC: 8.1 10*3/uL (ref 4.0–10.5)
nRBC: 0 % (ref 0.0–0.2)

## 2019-08-22 LAB — BASIC METABOLIC PANEL
Anion gap: 5 (ref 5–15)
BUN: 30 mg/dL — ABNORMAL HIGH (ref 8–23)
CO2: 26 mmol/L (ref 22–32)
Calcium: 8.1 mg/dL — ABNORMAL LOW (ref 8.9–10.3)
Chloride: 108 mmol/L (ref 98–111)
Creatinine, Ser: 1.28 mg/dL — ABNORMAL HIGH (ref 0.61–1.24)
GFR calc Af Amer: >60 mL/min (ref >60)
GFR calc non Af Amer: 55 mL/min — ABNORMAL LOW (ref >60)
Glucose, Bld: 93 mg/dL (ref 70–99)
Potassium: 3.4 mmol/L — ABNORMAL LOW (ref 3.5–5.1)
Sodium: 139 mmol/L (ref 135–145)

## 2019-08-22 LAB — GLUCOSE, CAPILLARY
Glucose-Capillary: 84 mg/dL (ref 70–99)
Glucose-Capillary: 106 mg/dL — ABNORMAL HIGH (ref 70–99)
Glucose-Capillary: 102 mg/dL — ABNORMAL HIGH (ref 70–99)

## 2019-08-22 MED ORDER — LOPERAMIDE HCL 2 MG PO CAPS: 2.0000 mg | ORAL_CAPSULE | ORAL | 0 refills | Status: AC | PRN

## 2019-08-22 MED ORDER — ALPRAZOLAM 1 MG PO TABS: 1.0000 mg | ORAL_TABLET | Freq: Two times a day (BID) | ORAL | 0 refills | Status: AC | PRN

## 2019-08-22 MED ORDER — OXYCODONE HCL 10 MG PO TABS: 10.0000 mg | ORAL_TABLET | Freq: Four times a day (QID) | ORAL | 0 refills | Status: AC | PRN

## 2019-08-22 NOTE — Discharge Summary (Signed)
Physician Discharge Summary  Alan Orr YQI:[id number] DOB: 11/02/1945 DOA: 08/18/2019  PCP: [name], MD  Admit date: 08/18/2019  Discharge date: 08/22/2019  Admitted From:Home  Disposition:  SNF  Recommendations for Outpatient Follow-up:  1. Follow up with PCP in 1-2 weeks 2. Follow-up with Dr. Aline Brochure with orthopedics regarding right humeral fracture and maintain arm in sling 3. Use Imodium as needed for any further diarrhea.  Infectious causes not suspected and this may be related to some mild opioid withdrawal. 4. Pain medication as well as Xanax prescribed with 0 refills as noted below. 5. Please consider discontinuation of Pradaxa in the near future if he still remains a high fall risk after rehabilitation.  Home Health: None  Equipment/Devices: None  Discharge Condition: Stable  CODE STATUS: Full  Diet recommendation: Heart Healthy  Brief/Interim Summary: Per HPI: Alan Orr a 74 y.o.malewith medical history significant ofwithhypertension, paroxysmalatrial fibrillation on Pradaxa, coronary artery disease s/p CABG, history of ventricular tachycardia with pacer/defibrillator and chronic pain syndrome on opiates and benzodiazepine is brought to ED for evaluation of fall. During my evaluation patient is awake and alert but not oriented to place person time and situation. Patient does not know why he is brought to the hospital today. According to the ED physician patient was brought by EMS after he was found on the floor by the family and patient recently had multiple falls in the last 4 to 5 days. Patient also broke his right arm. Patient lives alone by himself and his nephew visits him every day. Patient is complaining of mild pain in right upper extremity but denies fever, headache, dizziness, chest pain, shortness of breath, nausea, vomiting, abdominal pain and urinary symptoms. Looking at patient's condition it does not look saferfor him to live  alone at home and he need nursing home placement.  -Patient admitted with frequent falls and inability to care for himself which may be related to polypharmacy in the setting of benzodiazepine use. He is also noted to have right upper extremity pain with recent right humeral fracture. Sling will be placed to right upper extremity. PT evaluation revealing need for SNF/inpatient rehab.  He has had some brief episodes of diarrhea during this admission which have now resolved.  He is tolerating diet with no abdominal cramping or pain noted.  He has done well on Imodium with GI panel pending.  He is stable for discharge and will need follow-up with orthopedics as noted above in the next 1 week.  He is unsafe for discharge home.  Discharge Diagnoses:  Principal Problem:   Frequent falls Active Problems:   Type 2 diabetes mellitus with stage 3 chronic kidney disease (HCC)   Paroxysmal atrial fibrillation (HCC)   Unable to care for self   Weakness  Principal discharge diagnosis: Weakness with frequent falls and recent right upper extremity humeral fracture as a result.  Discharge Instructions  Discharge Instructions    Diet - low sodium heart healthy   Complete by: As directed    Increase activity slowly   Complete by: As directed      Allergies as of 08/22/2019   No Known Allergies     Medication List    TAKE these medications   ALPRAZolam 1 MG tablet Commonly known as: XANAX Take 1 tablet (1 mg total) by mouth 2 (two) times daily as needed for anxiety or sleep. What changed: when to take this   amiodarone 200 MG tablet Commonly known as: PACERONE Take 200 mg  by mouth daily.   aspirin EC 81 MG tablet Take 81 mg by mouth at bedtime.   atorvastatin 40 MG tablet Commonly known as: LIPITOR Take 40 mg by mouth at bedtime.   dabigatran 75 MG Caps capsule Commonly known as: PRADAXA Take 1 capsule (75 mg total) by mouth every 12 (twelve) hours.   ezetimibe 10 MG tablet Commonly  known as: ZETIA TAKE 1 TABLET ONCE DAILY FOR CHOLESTEROL. What changed: See the new instructions.   ferrous sulfate 325 (65 FE) MG tablet Take 325 mg by mouth daily with breakfast.   loperamide 2 MG capsule Commonly known as: IMODIUM Take 1 capsule (2 mg total) by mouth as needed for diarrhea or loose stools.   magnesium oxide 400 MG tablet Commonly known as: MAG-OX Take 400 mg by mouth daily.   metoprolol tartrate 50 MG tablet Commonly known as: LOPRESSOR TAKE 1 TABLET BY MOUTH TWICE DAILY.   Multi-Vitamins Tabs Take 1 tablet by mouth daily.   niacin 1000 MG CR tablet Commonly known as: NIASPAN TAKE (1) TABLET BY MOUTH AT BEDTIME. What changed: See the new instructions.   nitroGLYCERIN 0.4 MG SL tablet Commonly known as: NITROSTAT Place 0.4 mg under the tongue every 5 (five) minutes x 3 doses as needed for chest pain.   Oxycodone HCl 10 MG Tabs Take 1 tablet (10 mg total) by mouth every 6 (six) hours as needed for severe pain or breakthrough pain (for pain). What changed:   medication strength  how much to take  when to take this  reasons to take this   ramipril 2.5 MG capsule Commonly known as: ALTACE Take 2.5 mg by mouth daily.   Synthroid 137 MCG tablet Generic drug: levothyroxine Take 137 mcg by mouth daily before breakfast.      Follow-up Information    [name], MD Follow up in 1 week(s).   Specialty: Internal Medicine Contact information: [address] [place] [address] [phone]        Carole Civil, MD Follow up in 1 week(s).   Specialties: Orthopedic Surgery, Radiology Contact information: [address] [place] [address] [phone]          No Known Allergies  Consultations:  None   Procedures/Studies: DG Shoulder Right  Result Date: 08/15/2019 CLINICAL DATA:  Fall EXAM: RIGHT SHOULDER - 2+ VIEW COMPARISON:  None. FINDINGS: There is a comminuted mildly displaced impacted fracture seen  through the surgical neck of the right humerus involving the inferior portion the greater tuberosity and the medial humeral head. The humeral head still partially articulates with the glenoid. There is diffuse osteopenia. Overlying soft tissue swelling is noted. IMPRESSION: Comminuted mildly displaced proximal humerus fracture. Electronically Signed   By: Prudencio Pair M.D.   On: 08/15/2019 01:29   DG Elbow Complete Right  Result Date: 08/17/2019 CLINICAL DATA:  Recent fall with proximal humeral fracture, initial encounter EXAM: RIGHT ELBOW - COMPLETE 3+ VIEW COMPARISON:  10/04/2017 FINDINGS: Degenerative changes are noted about the elbow joint. A joint effusion is seen although no acute fracture is noted. There is some remodeling of the radial head noted which appears chronic in nature similar to that noted on the prior exam. IMPRESSION: Chronic degenerative changes with remodeling of the radial head. Joint effusion is noted but stable from the prior exam from 2 years ago. Electronically Signed   By: Inez Catalina M.D.   On: 08/17/2019 19:48   CT HEAD WO CONTRAST  Result Date: 08/18/2019 CLINICAL  DATA:  Several recent falls.  Headache.  Dizziness. EXAM: CT HEAD WITHOUT CONTRAST TECHNIQUE: Contiguous axial images were obtained from the base of the skull through the vertex without intravenous contrast. COMPARISON:  August 15, 2019 FINDINGS: Brain: Mild to moderate diffuse atrophy is stable. There is no intracranial mass, hemorrhage, extra-axial fluid collection, or midline shift. There is slight small vessel disease in the centra semiovale bilaterally. No acute infarct evident. Vascular: No hyperdense vessel. There is calcification in each carotid siphon region. Skull: Bony calvarium appears intact. Sinuses/Orbits: There is mucosal thickening in the right maxillary antrum. There is mucosal thickening in several ethmoid air cells. Orbits appear symmetric bilaterally. Other: Mastoid air cells are clear. IMPRESSION:  Stable atrophy with slight periventricular small vessel disease. No acute infarct. No mass or hemorrhage. There are foci of arterial vascular calcification. There are foci of paranasal sinus disease. Electronically Signed   By: Lowella Grip III M.D.   On: 08/18/2019 13:42   CT Head Wo Contrast  Result Date: 08/15/2019 CLINICAL DATA:  Fall EXAM: CT HEAD WITHOUT CONTRAST TECHNIQUE: Contiguous axial images were obtained from the base of the skull through the vertex without intravenous contrast. COMPARISON:  None. FINDINGS: Brain: There is atrophy and chronic small vessel disease changes. No acute intracranial abnormality. Specifically, no hemorrhage, hydrocephalus, mass lesion, acute infarction, or significant intracranial injury. Vascular: No hyperdense vessel or unexpected calcification. Skull: No acute calvarial abnormality. Sinuses/Orbits: Visualized paranasal sinuses and mastoids clear. Orbital soft tissues unremarkable. Other: None IMPRESSION: Atrophy, chronic microvascular disease. No acute intracranial abnormality. Electronically Signed   By: Rolm Baptise M.D.   On: 08/15/2019 17:30   CT Cervical Spine Wo Contrast  Result Date: 08/15/2019 CLINICAL DATA:  Fall EXAM: CT CERVICAL SPINE WITHOUT CONTRAST TECHNIQUE: Multidetector CT imaging of the cervical spine was performed without intravenous contrast. Multiplanar CT image reconstructions were also generated. COMPARISON:  None. FINDINGS: Alignment: Normal Skull base and vertebrae: No acute fracture. No primary bone lesion or focal pathologic process. Soft tissues and spinal canal: No prevertebral fluid or swelling. No visible canal hematoma. Disc levels:  Diffuse degenerative disc disease and facet disease. Upper chest: Old healed right rib fractures.  No acute findings. Other: Carotid artery calcifications. IMPRESSION: Degenerative disc and facet disease.  No acute bony abnormality. Electronically Signed   By: Rolm Baptise M.D.   On: 08/15/2019 17:32      Discharge Exam: Vitals:   08/22/19 0506 08/22/19 0914  BP: (!) 130/59   Pulse: (!) 58 60  Resp: 17   Temp: 98.6 F (37 C)   SpO2: 97%    Vitals:   08/21/19 1311 08/21/19 2212 08/22/19 0506 08/22/19 0914  BP: 140/66 99/83 (!) 130/59   Pulse: 78 66 (!) 58 60  Resp: 19 16 17    Temp: 98.3 F (36.8 C) 98 F (36.7 C) 98.6 F (37 C)   TempSrc:  Oral Oral   SpO2: 98% 100% 97%   Weight:      Height:        General: Pt is alert, awake, not in acute distress Cardiovascular: RRR, S1/S2 +, no rubs, no gallops Respiratory: CTA bilaterally, no wheezing, no rhonchi Abdominal: Soft, NT, ND, bowel sounds + Extremities: no edema, no cyanosis, right arm swelling and currently in sling    The results of significant diagnostics from this hospitalization (including imaging, microbiology, ancillary and laboratory) are listed below for reference.     Microbiology: Recent Results (from the past 240 hour(s))  SARS Coronavirus  2 by RT PCR (hospital order, performed in [facility] hospital lab) Nasopharyngeal Nasopharyngeal Swab     Status: None   Collection Time: 08/15/19  7:00 PM   Specimen: Nasopharyngeal Swab  Result Value Ref Range Status   SARS Coronavirus 2 NEGATIVE NEGATIVE Final    Comment: (NOTE) SARS-CoV-2 target nucleic acids are NOT DETECTED. The SARS-CoV-2 RNA is generally detectable in upper and lower respiratory specimens during the acute phase of infection. The lowest concentration of SARS-CoV-2 viral copies this assay can detect is 250 copies / mL. A negative result does not preclude SARS-CoV-2 infection and should not be used as the sole basis for treatment or other patient management decisions.  A negative result may occur with improper specimen collection / handling, submission of specimen other than nasopharyngeal swab, presence of viral mutation(s) within the areas targeted by this assay, and inadequate number of viral copies (<250 copies / mL). A negative  result must be combined with clinical observations, patient history, and epidemiological information. Fact Sheet for Patients:   StrictlyIdeas.no Fact Sheet for Healthcare Providers: BankingDealers.co.za This test is not yet approved or cleared  by the [place] FDA and has been authorized for detection and/or diagnosis of SARS-CoV-2 by FDA under an Emergency Use Authorization (EUA).  This EUA will remain in effect (meaning this test can be used) for the duration of the COVID-19 declaration under Section 564(b)(1) of the Act, 21 U.S.C. section 360bbb-3(b)(1), unless the authorization is terminated or revoked sooner. Performed at [facility], [address] [place], [place] [address]   SARS Coronavirus 2 by RT PCR (hospital order, performed in [facility] hospital lab) Nasopharyngeal Nasopharyngeal Swab     Status: None   Collection Time: 08/18/19  9:11 PM   Specimen: Nasopharyngeal Swab  Result Value Ref Range Status   SARS Coronavirus 2 NEGATIVE NEGATIVE Final    Comment: (NOTE) SARS-CoV-2 target nucleic acids are NOT DETECTED. The SARS-CoV-2 RNA is generally detectable in upper and lower respiratory specimens during the acute phase of infection. The lowest concentration of SARS-CoV-2 viral copies this assay can detect is 250 copies / mL. A negative result does not preclude SARS-CoV-2 infection and should not be used as the sole basis for treatment or other patient management decisions.  A negative result may occur with improper specimen collection / handling, submission of specimen other than nasopharyngeal swab, presence of viral mutation(s) within the areas targeted by this assay, and inadequate number of viral copies (<250 copies / mL). A negative result must be combined with clinical observations, patient history, and epidemiological information. Fact Sheet for Patients:   StrictlyIdeas.no Fact Sheet  for Healthcare Providers: BankingDealers.co.za This test is not yet approved or cleared  by the [place] FDA and has been authorized for detection and/or diagnosis of SARS-CoV-2 by FDA under an Emergency Use Authorization (EUA).  This EUA will remain in effect (meaning this test can be used) for the duration of the COVID-19 declaration under Section 564(b)(1) of the Act, 21 U.S.C. section 360bbb-3(b)(1), unless the authorization is terminated or revoked sooner. Performed at [facility], [address] [place], [place] [address]      Labs: BNP (last 3 results) No results for input(s): BNP in the last 8760 hours. Basic Metabolic Panel: Recent Labs  Lab 08/18/19 1154 08/19/19 0641 08/20/19 0423 08/21/19 0516 08/22/19 0459  NA 140 141 140 138 139  K 3.9 3.9 3.7 3.6 3.4*  CL 103 108 108 106 108  CO2 24 26  26 26 26   GLUCOSE 126* 107* 114* 90 93  BUN 23 19 24* 29* 30*  CREATININE 1.32* 1.19 1.37* 1.34* 1.28*  CALCIUM 8.6* 8.1* 8.4* 8.2* 8.1*   Liver Function Tests: Recent Labs  Lab 08/15/19 1650 08/16/19 0442 08/18/19 1154  AST 64* 51* 52*  ALT 66* 59* 48*  ALKPHOS 66 62 64  BILITOT 1.0 1.0 1.7*  PROT 6.1* 6.0* 6.3*  ALBUMIN 2.8* 2.7* 2.8*   No results for input(s): LIPASE, AMYLASE in the last 168 hours. No results for input(s): AMMONIA in the last 168 hours. CBC: Recent Labs  Lab 08/15/19 1545 08/16/19 0442 08/17/19 2004 08/18/19 1154 08/19/19 0641 08/20/19 0423 08/22/19 0459  WBC 11.5*   < > 11.8* 10.5 6.7 6.4 8.1  NEUTROABS 8.3*  --  9.0*  --   --   --   --   HGB 12.2*   < > 10.4* 10.8* 9.7* 10.4* 9.6*  HCT 37.6*   < > 31.8* 33.0* 30.0* 31.7* 28.9*  MCV 107.7*   < > 108.5* 109.3* 106.8* 107.8* 105.5*  PLT 220   < > 182 197 186 226 276   < > = values in this interval not displayed.   Cardiac Enzymes: Recent Labs  Lab 08/17/19 2004 08/18/19 1154 08/20/19 1519  CKTOTAL 190 281 108   BNP: Invalid input(s):  POCBNP CBG: Recent Labs  Lab 08/21/19 1141 08/21/19 1636 08/21/19 2217 08/22/19 0717 08/22/19 1101  GLUCAP 96 110* 94 84 106*   D-Dimer No results for input(s): DDIMER in the last 72 hours. Hgb A1c No results for input(s): HGBA1C in the last 72 hours. Lipid Profile No results for input(s): CHOL, HDL, LDLCALC, TRIG, CHOLHDL, LDLDIRECT in the last 72 hours. Thyroid function studies No results for input(s): TSH, T4TOTAL, T3FREE, THYROIDAB in the last 72 hours.  Invalid input(s): FREET3 Anemia work up No results for input(s): VITAMINB12, FOLATE, FERRITIN, TIBC, IRON, RETICCTPCT in the last 72 hours. Urinalysis    Component Value Date/Time   COLORURINE YELLOW 08/18/2019 1138   APPEARANCEUR CLEAR 08/18/2019 1138   LABSPEC 1.020 08/18/2019 1138   PHURINE 5.0 08/18/2019 1138   GLUCOSEU NEGATIVE 08/18/2019 1138   HGBUR LARGE (A) 08/18/2019 1138   BILIRUBINUR NEGATIVE 08/18/2019 1138   KETONESUR 5 (A) 08/18/2019 1138   PROTEINUR NEGATIVE 08/18/2019 1138   UROBILINOGEN 0.2 01/05/2015 1245   NITRITE NEGATIVE 08/18/2019 1138   LEUKOCYTESUR NEGATIVE 08/18/2019 1138   Sepsis Labs Invalid input(s): PROCALCITONIN,  WBC,  LACTICIDVEN Microbiology Recent Results (from the past 240 hour(s))  SARS Coronavirus 2 by RT PCR (hospital order, performed in [place] hospital lab) Nasopharyngeal Nasopharyngeal Swab     Status: None   Collection Time: 08/15/19  7:00 PM   Specimen: Nasopharyngeal Swab  Result Value Ref Range Status   SARS Coronavirus 2 NEGATIVE NEGATIVE Final    Comment: (NOTE) SARS-CoV-2 target nucleic acids are NOT DETECTED. The SARS-CoV-2 RNA is generally detectable in upper and lower respiratory specimens during the acute phase of infection. The lowest concentration of SARS-CoV-2 viral copies this assay can detect is 250 copies / mL. A negative result does not preclude SARS-CoV-2 infection and should not be used as the sole basis for treatment or other patient  management decisions.  A negative result may occur with improper specimen collection / handling, submission of specimen other than nasopharyngeal swab, presence of viral mutation(s) within the areas targeted by this assay, and inadequate number of viral copies (<250 copies / mL). A negative  result must be combined with clinical observations, patient history, and epidemiological information. Fact Sheet for Patients:   StrictlyIdeas.no Fact Sheet for Healthcare Providers: BankingDealers.co.za This test is not yet approved or cleared  by the [place] FDA and has been authorized for detection and/or diagnosis of SARS-CoV-2 by FDA under an Emergency Use Authorization (EUA).  This EUA will remain in effect (meaning this test can be used) for the duration of the COVID-19 declaration under Section 564(b)(1) of the Act, 21 U.S.C. section 360bbb-3(b)(1), unless the authorization is terminated or revoked sooner. Performed at [facility], [address] [place], [place] [address]   SARS Coronavirus 2 by RT PCR (hospital order, performed in [facility] hospital lab) Nasopharyngeal Nasopharyngeal Swab     Status: None   Collection Time: 08/18/19  9:11 PM   Specimen: Nasopharyngeal Swab  Result Value Ref Range Status   SARS Coronavirus 2 NEGATIVE NEGATIVE Final    Comment: (NOTE) SARS-CoV-2 target nucleic acids are NOT DETECTED. The SARS-CoV-2 RNA is generally detectable in upper and lower respiratory specimens during the acute phase of infection. The lowest concentration of SARS-CoV-2 viral copies this assay can detect is 250 copies / mL. A negative result does not preclude SARS-CoV-2 infection and should not be used as the sole basis for treatment or other patient management decisions.  A negative result may occur with improper specimen collection / handling, submission of specimen other than nasopharyngeal swab, presence of viral mutation(s)  within the areas targeted by this assay, and inadequate number of viral copies (<250 copies / mL). A negative result must be combined with clinical observations, patient history, and epidemiological information. Fact Sheet for Patients:   StrictlyIdeas.no Fact Sheet for Healthcare Providers: BankingDealers.co.za This test is not yet approved or cleared  by the [place] FDA and has been authorized for detection and/or diagnosis of SARS-CoV-2 by FDA under an Emergency Use Authorization (EUA).  This EUA will remain in effect (meaning this test can be used) for the duration of the COVID-19 declaration under Section 564(b)(1) of the Act, 21 U.S.C. section 360bbb-3(b)(1), unless the authorization is terminated or revoked sooner. Performed at [facility], [address], [place],  [address]      Time coordinating discharge: 35 minutes  SIGNED:   Rodena Goldmann, DO [facility] 08/22/2019, 11:10 AM  If 7PM-7AM, please contact night-coverage [email or web address]

## 2019-08-22 NOTE — Care Management Important Message (Signed)
Important Message  Patient Details  Name: Alan Orr MRN: [id number] Date of Birth: 09-Mar-1946   Medicare Important Message Given:  Margaretmary Bayley, RN will deliver due to contact precautions)     Tommy Medal 08/22/2019, 11:10 AM

## 2019-08-22 NOTE — Progress Notes (Signed)
Patient discharged to [facility] this afternoon. Discharge packet sent with patient and EMS transport. Report given to receiving nurse at facility. Patient left floor in stable condition via stretcher with EMS transport.

## 2019-08-28 ENCOUNTER — Encounter (HOSPITAL_COMMUNITY): Payer: Self-pay | Admitting: Emergency Medicine

## 2019-08-28 ENCOUNTER — Encounter (HOSPITAL_COMMUNITY): Admission: AD | Disposition: E | Payer: Self-pay | Source: Skilled Nursing Facility | Attending: Pulmonary Disease

## 2019-08-28 ENCOUNTER — Inpatient Hospital Stay (HOSPITAL_COMMUNITY)
Admission: AD | Admit: 2019-08-28 | Discharge: 2019-09-14 | DRG: 853 | Disposition: E | Payer: Medicare Other | Source: Skilled Nursing Facility | Attending: Pulmonary Disease | Admitting: Pulmonary Disease

## 2019-08-28 ENCOUNTER — Emergency Department (HOSPITAL_COMMUNITY): Payer: Medicare Other

## 2019-08-28 DIAGNOSIS — R6521 Severe sepsis with septic shock: Secondary | ICD-10-CM | POA: Diagnosis present

## 2019-08-28 DIAGNOSIS — N179 Acute kidney failure, unspecified: Secondary | ICD-10-CM | POA: Diagnosis not present

## 2019-08-28 DIAGNOSIS — Z951 Presence of aortocoronary bypass graft: Secondary | ICD-10-CM | POA: Diagnosis not present

## 2019-08-28 DIAGNOSIS — E039 Hypothyroidism, unspecified: Secondary | ICD-10-CM | POA: Diagnosis present

## 2019-08-28 DIAGNOSIS — I5043 Acute on chronic combined systolic (congestive) and diastolic (congestive) heart failure: Secondary | ICD-10-CM | POA: Diagnosis present

## 2019-08-28 DIAGNOSIS — Z79890 Hormone replacement therapy (postmenopausal): Secondary | ICD-10-CM

## 2019-08-28 DIAGNOSIS — I2510 Atherosclerotic heart disease of native coronary artery without angina pectoris: Secondary | ICD-10-CM | POA: Diagnosis present

## 2019-08-28 DIAGNOSIS — H9190 Unspecified hearing loss, unspecified ear: Secondary | ICD-10-CM | POA: Diagnosis present

## 2019-08-28 DIAGNOSIS — Z7901 Long term (current) use of anticoagulants: Secondary | ICD-10-CM

## 2019-08-28 DIAGNOSIS — I4510 Unspecified right bundle-branch block: Secondary | ICD-10-CM | POA: Diagnosis present

## 2019-08-28 DIAGNOSIS — Z01818 Encounter for other preprocedural examination: Secondary | ICD-10-CM

## 2019-08-28 DIAGNOSIS — K631 Perforation of intestine (nontraumatic): Secondary | ICD-10-CM | POA: Diagnosis present

## 2019-08-28 DIAGNOSIS — I255 Ischemic cardiomyopathy: Secondary | ICD-10-CM | POA: Diagnosis present

## 2019-08-28 DIAGNOSIS — N170 Acute kidney failure with tubular necrosis: Secondary | ICD-10-CM | POA: Diagnosis present

## 2019-08-28 DIAGNOSIS — Z8249 Family history of ischemic heart disease and other diseases of the circulatory system: Secondary | ICD-10-CM

## 2019-08-28 DIAGNOSIS — Z86718 Personal history of other venous thrombosis and embolism: Secondary | ICD-10-CM

## 2019-08-28 DIAGNOSIS — K7200 Acute and subacute hepatic failure without coma: Secondary | ICD-10-CM | POA: Diagnosis not present

## 2019-08-28 DIAGNOSIS — E785 Hyperlipidemia, unspecified: Secondary | ICD-10-CM | POA: Diagnosis present

## 2019-08-28 DIAGNOSIS — R1084 Generalized abdominal pain: Secondary | ICD-10-CM | POA: Diagnosis present

## 2019-08-28 DIAGNOSIS — S42301D Unspecified fracture of shaft of humerus, right arm, subsequent encounter for fracture with routine healing: Secondary | ICD-10-CM

## 2019-08-28 DIAGNOSIS — E876 Hypokalemia: Secondary | ICD-10-CM

## 2019-08-28 DIAGNOSIS — I513 Intracardiac thrombosis, not elsewhere classified: Secondary | ICD-10-CM | POA: Diagnosis not present

## 2019-08-28 DIAGNOSIS — A419 Sepsis, unspecified organism: Secondary | ICD-10-CM

## 2019-08-28 DIAGNOSIS — Z79899 Other long term (current) drug therapy: Secondary | ICD-10-CM

## 2019-08-28 DIAGNOSIS — I472 Ventricular tachycardia: Secondary | ICD-10-CM | POA: Diagnosis present

## 2019-08-28 DIAGNOSIS — K650 Generalized (acute) peritonitis: Secondary | ICD-10-CM | POA: Diagnosis present

## 2019-08-28 DIAGNOSIS — Z823 Family history of stroke: Secondary | ICD-10-CM

## 2019-08-28 DIAGNOSIS — J9600 Acute respiratory failure, unspecified whether with hypoxia or hypercapnia: Secondary | ICD-10-CM | POA: Diagnosis present

## 2019-08-28 DIAGNOSIS — I4729 Other ventricular tachycardia: Secondary | ICD-10-CM

## 2019-08-28 DIAGNOSIS — R6520 Severe sepsis without septic shock: Secondary | ICD-10-CM

## 2019-08-28 DIAGNOSIS — Z20822 Contact with and (suspected) exposure to covid-19: Secondary | ICD-10-CM | POA: Diagnosis present

## 2019-08-28 DIAGNOSIS — E1122 Type 2 diabetes mellitus with diabetic chronic kidney disease: Secondary | ICD-10-CM | POA: Diagnosis present

## 2019-08-28 DIAGNOSIS — W19XXXD Unspecified fall, subsequent encounter: Secondary | ICD-10-CM | POA: Diagnosis present

## 2019-08-28 DIAGNOSIS — F0390 Unspecified dementia without behavioral disturbance: Secondary | ICD-10-CM | POA: Diagnosis present

## 2019-08-28 DIAGNOSIS — Z87891 Personal history of nicotine dependence: Secondary | ICD-10-CM

## 2019-08-28 DIAGNOSIS — N1832 Chronic kidney disease, stage 3b: Secondary | ICD-10-CM | POA: Diagnosis present

## 2019-08-28 DIAGNOSIS — N183 Chronic kidney disease, stage 3 unspecified: Secondary | ICD-10-CM | POA: Diagnosis present

## 2019-08-28 DIAGNOSIS — Z8679 Personal history of other diseases of the circulatory system: Secondary | ICD-10-CM

## 2019-08-28 DIAGNOSIS — I480 Paroxysmal atrial fibrillation: Secondary | ICD-10-CM | POA: Diagnosis not present

## 2019-08-28 DIAGNOSIS — E1121 Type 2 diabetes mellitus with diabetic nephropathy: Secondary | ICD-10-CM

## 2019-08-28 DIAGNOSIS — R34 Anuria and oliguria: Secondary | ICD-10-CM | POA: Diagnosis not present

## 2019-08-28 DIAGNOSIS — I130 Hypertensive heart and chronic kidney disease with heart failure and stage 1 through stage 4 chronic kidney disease, or unspecified chronic kidney disease: Secondary | ICD-10-CM | POA: Diagnosis present

## 2019-08-28 DIAGNOSIS — N1831 Chronic kidney disease, stage 3a: Secondary | ICD-10-CM

## 2019-08-28 DIAGNOSIS — Z7982 Long term (current) use of aspirin: Secondary | ICD-10-CM

## 2019-08-28 DIAGNOSIS — E872 Acidosis: Secondary | ICD-10-CM | POA: Diagnosis not present

## 2019-08-28 DIAGNOSIS — Z419 Encounter for procedure for purposes other than remedying health state, unspecified: Secondary | ICD-10-CM

## 2019-08-28 DIAGNOSIS — D62 Acute posthemorrhagic anemia: Secondary | ICD-10-CM | POA: Diagnosis not present

## 2019-08-28 DIAGNOSIS — Z66 Do not resuscitate: Secondary | ICD-10-CM | POA: Diagnosis not present

## 2019-08-28 DIAGNOSIS — E875 Hyperkalemia: Secondary | ICD-10-CM | POA: Diagnosis present

## 2019-08-28 DIAGNOSIS — E1165 Type 2 diabetes mellitus with hyperglycemia: Secondary | ICD-10-CM | POA: Diagnosis present

## 2019-08-28 DIAGNOSIS — Z95810 Presence of automatic (implantable) cardiac defibrillator: Secondary | ICD-10-CM | POA: Diagnosis not present

## 2019-08-28 HISTORY — PX: ILEOSTOMY: SHX1783

## 2019-08-28 HISTORY — PX: PARTIAL COLECTOMY: SHX5273

## 2019-08-28 HISTORY — PX: LAPAROTOMY: SHX154

## 2019-08-28 LAB — CBC WITH DIFFERENTIAL/PLATELET
Abs Immature Granulocytes: 0.03 10*3/uL (ref 0.00–0.07)
Basophils Absolute: 0 10*3/uL (ref 0.0–0.1)
Basophils Relative: 0 %
Eosinophils Absolute: 0 10*3/uL (ref 0.0–0.5)
Eosinophils Relative: 0 %
HCT: 38.3 % — ABNORMAL LOW (ref 39.0–52.0)
Hemoglobin: 12.8 g/dL — ABNORMAL LOW (ref 13.0–17.0)
Immature Granulocytes: 0 %
Lymphocytes Relative: 5 %
Lymphs Abs: 0.6 10*3/uL — ABNORMAL LOW (ref 0.7–4.0)
MCH: 35.8 pg — ABNORMAL HIGH (ref 26.0–34.0)
MCHC: 33.4 g/dL (ref 30.0–36.0)
MCV: 107 fL — ABNORMAL HIGH (ref 80.0–100.0)
Monocytes Absolute: 0.4 10*3/uL (ref 0.1–1.0)
Monocytes Relative: 4 %
Neutro Abs: 10.7 10*3/uL — ABNORMAL HIGH (ref 1.7–7.7)
Neutrophils Relative %: 91 %
Platelets: 352 10*3/uL (ref 150–400)
RBC: 3.58 MIL/uL — ABNORMAL LOW (ref 4.22–5.81)
RDW: 16.7 % — ABNORMAL HIGH (ref 11.5–15.5)
WBC: 11.8 10*3/uL — ABNORMAL HIGH (ref 4.0–10.5)
nRBC: 0 % (ref 0.0–0.2)

## 2019-08-28 LAB — COMPREHENSIVE METABOLIC PANEL
ALT: 60 U/L — ABNORMAL HIGH (ref 0–44)
AST: 116 U/L — ABNORMAL HIGH (ref 15–41)
Albumin: 2 g/dL — ABNORMAL LOW (ref 3.5–5.0)
Alkaline Phosphatase: 96 U/L (ref 38–126)
Anion gap: 14 (ref 5–15)
BUN: 33 mg/dL — ABNORMAL HIGH (ref 8–23)
CO2: 21 mmol/L — ABNORMAL LOW (ref 22–32)
Calcium: 7.8 mg/dL — ABNORMAL LOW (ref 8.9–10.3)
Chloride: 103 mmol/L (ref 98–111)
Creatinine, Ser: 2.52 mg/dL — ABNORMAL HIGH (ref 0.61–1.24)
GFR calc Af Amer: 28 mL/min — ABNORMAL LOW (ref >60)
GFR calc non Af Amer: 24 mL/min — ABNORMAL LOW (ref >60)
Glucose, Bld: 215 mg/dL — ABNORMAL HIGH (ref 70–99)
Potassium: 2.8 mmol/L — ABNORMAL LOW (ref 3.5–5.1)
Sodium: 138 mmol/L (ref 135–145)
Total Bilirubin: 1.1 mg/dL (ref 0.3–1.2)
Total Protein: 5.7 g/dL — ABNORMAL LOW (ref 6.5–8.1)

## 2019-08-28 LAB — PROTIME-INR
INR: 1.8 — ABNORMAL HIGH (ref 0.8–1.2)
Prothrombin Time: 19.9 seconds — ABNORMAL HIGH (ref 11.4–15.2)

## 2019-08-28 LAB — LIPASE, BLOOD: Lipase: 23 U/L (ref 11–51)

## 2019-08-28 LAB — APTT: aPTT: 60 seconds — ABNORMAL HIGH (ref 24–36)

## 2019-08-28 LAB — SARS CORONAVIRUS 2 BY RT PCR (HOSPITAL ORDER, PERFORMED IN ~~LOC~~ HOSPITAL LAB): SARS Coronavirus 2: NEGATIVE

## 2019-08-28 SURGERY — LAPAROTOMY, EXPLORATORY
Anesthesia: General | Site: Abdomen | Laterality: Right

## 2019-08-28 MED ORDER — FENTANYL CITRATE (PF) 250 MCG/5ML IJ SOLN
INTRAMUSCULAR | Status: AC
  Filled 2019-08-28: qty 5

## 2019-08-28 MED ORDER — PROPOFOL 10 MG/ML IV BOLUS
INTRAVENOUS | Status: AC
  Filled 2019-08-28: qty 20

## 2019-08-28 MED ORDER — SODIUM CHLORIDE 0.9 % IV BOLUS
1000.0000 mL | Freq: Once | INTRAVENOUS | Status: AC
  Administered 2019-08-28: 1000 mL via INTRAVENOUS

## 2019-08-28 MED ORDER — POTASSIUM CHLORIDE 10 MEQ/100ML IV SOLN
10.0000 meq | Freq: Once | INTRAVENOUS | Status: AC
  Administered 2019-08-28: 10 meq via INTRAVENOUS
  Filled 2019-08-28: qty 100

## 2019-08-28 MED ORDER — MORPHINE SULFATE (PF) 2 MG/ML IV SOLN: 2.0000 mg | Freq: Once | INTRAVENOUS | Status: AC

## 2019-08-28 MED ORDER — IDARUCIZUMAB 2.5 GM/50ML IV SOLN
5.0000 g | INTRAVENOUS | Status: AC
  Administered 2019-08-28: 5 g via INTRAVENOUS
  Filled 2019-08-28: qty 100

## 2019-08-28 MED ORDER — PIPERACILLIN-TAZOBACTAM 3.375 G IVPB 30 MIN
3.3750 g | Freq: Once | INTRAVENOUS | Status: AC
  Administered 2019-08-28: 3.375 g via INTRAVENOUS
  Filled 2019-08-28: qty 50

## 2019-08-28 MED ORDER — MORPHINE SULFATE (PF) 2 MG/ML IV SOLN
INTRAVENOUS | Status: AC
  Administered 2019-08-28: 2 mg via INTRAVENOUS
  Filled 2019-08-28: qty 1

## 2019-08-28 MED ORDER — LACTATED RINGERS IV BOLUS
1000.0000 mL | Freq: Once | INTRAVENOUS | Status: AC
  Administered 2019-08-28: 1000 mL via INTRAVENOUS

## 2019-08-28 MED ORDER — SODIUM CHLORIDE 0.9 % IV BOLUS
500.0000 mL | Freq: Once | INTRAVENOUS | Status: AC
  Administered 2019-08-28: 500 mL via INTRAVENOUS

## 2019-08-28 SURGICAL SUPPLY — 49 items
BNDG GAUZE ELAST 4 BULKY (GAUZE/BANDAGES/DRESSINGS) ×4 IMPLANT
CHLORAPREP W/TINT 26 (MISCELLANEOUS) ×4 IMPLANT
CLIP VESOCCLUDE LG 6/CT (CLIP) ×4 IMPLANT
CLIP VESOCCLUDE MED 6/CT (CLIP) ×4 IMPLANT
CLIP VESOCCLUDE SM WIDE 6/CT (CLIP) ×4 IMPLANT
COVER SURGICAL LIGHT HANDLE (MISCELLANEOUS) ×4 IMPLANT
DRAPE LAPAROSCOPIC ABDOMINAL (DRAPES) ×4 IMPLANT
DRAPE WARM FLUID 44X44 (DRAPES) ×4 IMPLANT
DRSG OPSITE POSTOP 4X10 (GAUZE/BANDAGES/DRESSINGS) IMPLANT
DRSG OPSITE POSTOP 4X8 (GAUZE/BANDAGES/DRESSINGS) IMPLANT
ELECT BLADE 6.5 EXT (BLADE) IMPLANT
ELECT CAUTERY BLADE 6.4 (BLADE) ×4 IMPLANT
ELECT REM PT RETURN 9FT ADLT (ELECTROSURGICAL) ×4
ELECTRODE REM PT RTRN 9FT ADLT (ELECTROSURGICAL) ×2 IMPLANT
GLOVE BIOGEL PI IND STRL 7.0 (GLOVE) ×2 IMPLANT
GLOVE BIOGEL PI INDICATOR 7.0 (GLOVE) ×2
GLOVE SURG SS PI 7.0 STRL IVOR (GLOVE) ×4 IMPLANT
GLOVE SURG SYN 7.5  E (GLOVE) ×4
GLOVE SURG SYN 7.5 E (GLOVE) ×2 IMPLANT
GOWN STRL REUS W/ TWL LRG LVL3 (GOWN DISPOSABLE) ×4 IMPLANT
GOWN STRL REUS W/TWL LRG LVL3 (GOWN DISPOSABLE) ×8
HANDLE SUCTION POOLE (INSTRUMENTS) ×2 IMPLANT
KIT BASIN OR (CUSTOM PROCEDURE TRAY) ×4 IMPLANT
KIT OSTOMY DRAINABLE 2.75 STR (WOUND CARE) ×4 IMPLANT
LIGASURE IMPACT 36 18CM CVD LR (INSTRUMENTS) ×4 IMPLANT
LOOP VESSEL MAXI BLUE (MISCELLANEOUS) IMPLANT
LOOP VESSEL MINI RED (MISCELLANEOUS) IMPLANT
NEEDLE 22X1 1/2 (OR ONLY) (NEEDLE) ×4 IMPLANT
PACK GENERAL/GYN (CUSTOM PROCEDURE TRAY) ×4 IMPLANT
PAD ABD 8X10 STRL (GAUZE/BANDAGES/DRESSINGS) ×8 IMPLANT
PENCIL SMOKE EVACUATOR (MISCELLANEOUS) ×4 IMPLANT
RELOAD PROXIMATE 75MM BLUE (ENDOMECHANICALS) ×4 IMPLANT
SPONGE LAP 18X18 RF (DISPOSABLE) ×8 IMPLANT
STAPLER PROXIMATE 75MM BLUE (STAPLE) ×4 IMPLANT
STAPLER VISISTAT 35W (STAPLE) ×4 IMPLANT
SUCTION POOLE HANDLE (INSTRUMENTS) ×4
SUT PDS AB 0 CTX 36 PDP370T (SUTURE) ×8 IMPLANT
SUT PDS AB 1 TP1 96 (SUTURE) IMPLANT
SUT SILK 2 0 (SUTURE) ×4
SUT SILK 2 0 SH CR/8 (SUTURE) ×4 IMPLANT
SUT SILK 2-0 18XBRD TIE 12 (SUTURE) ×2 IMPLANT
SUT SILK 3 0 (SUTURE) ×4
SUT SILK 3 0 SH CR/8 (SUTURE) ×4 IMPLANT
SUT SILK 3-0 18XBRD TIE 12 (SUTURE) ×2 IMPLANT
SUT VIC AB 3-0 SH 18 (SUTURE) ×8 IMPLANT
TAPE CLOTH SURG 4X10 WHT LF (GAUZE/BANDAGES/DRESSINGS) ×4 IMPLANT
TOWEL GREEN STERILE (TOWEL DISPOSABLE) ×4 IMPLANT
TRAY FOLEY MTR SLVR 16FR STAT (SET/KITS/TRAYS/PACK) ×8 IMPLANT
YANKAUER SUCT BULB TIP NO VENT (SUCTIONS) IMPLANT

## 2019-08-28 NOTE — ED Provider Notes (Signed)
Pt transferred from [facility] ED for bowel perforation, surgery consult. On arrival, pt alert and comfortable. BP low normal, remainder of VS normal. Added fluid bolus. Pt seen by Dr. Kieth Brightly, to be admitted for operative management.    Klea Nall, Wenda Overland, MD 09/11/2019 [phone]

## 2019-08-28 NOTE — ED Provider Notes (Addendum)
[facility] EMERGENCY DEPARTMENT Provider Note   CSN: [id number] Arrival date & time: 09/13/2019  1518     History Chief Complaint  Patient presents with  . Abdominal Pain    Alan Orr is a 74 y.o. male.  Patient presents with distended abdomen and tenderness.  The history is provided by the patient and medical records. No language interpreter was used.  Abdominal Pain Pain location:  Generalized Pain quality: aching   Pain radiates to:  Does not radiate Pain severity:  Moderate Onset quality:  Sudden Chronicity:  New Context: not alcohol use   Relieved by:  Nothing Associated symptoms: no chest pain, no cough, no diarrhea, no fatigue and no hematuria        Past Medical History:  Diagnosis Date  . CAD (coronary artery disease)   . DM (diabetes mellitus) ([place])    type 2   . DVT (deep venous thrombosis) ([place])   . Hyperlipidemia   . Hypertension   . ICD (implantable cardioverter-defibrillator), single, in situ   . Ischemic cardiomyopathy   . PAF (paroxysmal atrial fibrillation) ([place])   . RBBB   . S/P CABG x 5     Patient Active Problem List   Diagnosis Date Noted  . Unable to care for self 08/19/2019  . Weakness 08/19/2019  . Frequent falls 08/18/2019  . AKI (acute kidney injury) ([place]) 08/15/2019  . Cellulitis of right lower extremity 06/21/2018  . Salmonella sepsis ([place]) 10/21/2016  . Salmonella gastroenteritis 10/20/2016  . Transaminasemia 10/18/2016  . Pancytopenia ([place]) 10/18/2016  . DM type 2 (diabetes mellitus, type 2) ([place]) 10/18/2016  . Thrombocytopenia ([place]) 08/22/2016  . Tick bite of right thigh 08/22/2016  . Abnormal serum protein electrophoresis 08/21/2014  . Leg wound, left 11/03/2013  . History of carotid artery disease 03/03/2013  . Cardiomyopathy, ischemic 11/08/2012  . CAD s/p CABG  11/08/2012  . Hyperlipidemia 11/08/2012  . Paroxysmal atrial fibrillation ([place]) 11/08/2012  . Ventricular tachycardia ([place]) 11/08/2012  . Type 2  diabetes mellitus with stage 3 chronic kidney disease ([place]) 08/20/2009  . PACEMAKER, PERMANENT 08/20/2009  . Biventricular ICD (implantable cardioverter-defibrillator) in place 08/20/2009    Past Surgical History:  Procedure Laterality Date  . BACK SURGERY    . CARDIAC CATHETERIZATION  05/08/2009   small vessel disease  . CARDIAC DEFIBRILLATOR PLACEMENT  05/09/2009   [employer]  . CORONARY ARTERY BYPASS GRAFT  12/31/1995   LIMA to LAD,SVG to intermediate,SVG to CX,seq. svg to posterior descending and posterolateral RCA  . EP IMPLANTABLE DEVICE N/A 09/25/2015   Procedure: PPM/BIV PPM Generator Changeout;  Surgeon: Evans Lance, MD;  Location: [facility];  Service: Cardiovascular;  Laterality: N/A;  . I & D EXTREMITY Left 11/03/2013   Procedure: Left Leg Debride Ulcer, Apply Wound VAC and [place];  Surgeon: Newt Minion, MD;  Location: [facility];  Service: Orthopedics;  Laterality: Left;  . TEE WITHOUT CARDIOVERSION N/A 01/11/2015   Procedure: TRANSESOPHAGEAL ECHOCARDIOGRAM (TEE);  Surgeon: Herminio Commons, MD;  Location: [facility];  Service: Cardiology;  Laterality: N/A;       Family History  Problem Relation Age of Onset  . Heart attack Mother   . Stroke Father     Social History   Tobacco Use  . Smoking status: Former Smoker    Packs/day: 1.50    Years: 35.00    Pack years: 52.50    Types: Cigarettes    Quit date: 11/03/1995    Years  since quitting: 23.8  . Smokeless tobacco: Never Used  Vaping Use  . Vaping Use: Never used  Substance Use Topics  . Alcohol use: Yes    Comment: occasionally  . Drug use: No    Home Medications Prior to Admission medications   Medication Sig Start Date End Date Taking? Authorizing Provider  acetaminophen (TYLENOL) 325 MG tablet Take 650 mg by mouth every 6 (six) hours as needed for mild pain or moderate pain.   Yes [provider]  ALPRAZolam Duanne Moron) 1 MG tablet Take 1 tablet (1 mg total) by mouth 2 (two) times  daily as needed for anxiety or sleep. 08/22/19  Yes Shah, Pratik D, DO  amiodarone (PACERONE) 200 MG tablet Take 200 mg by mouth daily.   Yes [provider]  aspirin EC 81 MG tablet Take 81 mg by mouth at bedtime.   Yes [provider]  atorvastatin (LIPITOR) 40 MG tablet Take 40 mg by mouth at bedtime.   Yes [provider]  dabigatran (PRADAXA) 75 MG CAPS capsule Take 1 capsule (75 mg total) by mouth every 12 (twelve) hours. 08/16/19  Yes Danford, Suann Larry, MD  ezetimibe (ZETIA) 10 MG tablet TAKE 1 TABLET ONCE DAILY FOR CHOLESTEROL. Patient taking differently: Take 10 mg by mouth daily.  01/04/17  Yes Lorretta Harp, MD  ferrous sulfate 325 (65 FE) MG tablet Take 325 mg by mouth daily with breakfast.   Yes [provider]  loperamide (IMODIUM) 2 MG capsule Take 1 capsule (2 mg total) by mouth as needed for diarrhea or loose stools. 08/22/19  Yes Shah, Pratik D, DO  magnesium oxide (MAG-OX) 400 MG tablet Take 400 mg by mouth daily.   Yes [provider]  metoprolol tartrate (LOPRESSOR) 50 MG tablet TAKE 1 TABLET BY MOUTH TWICE DAILY. Patient taking differently: Take 50 mg by mouth 2 (two) times daily.  10/10/18  Yes Evans Lance, MD  Multiple Vitamin (MULTI-VITAMINS) TABS Take 1 tablet by mouth daily.   Yes [provider]  niacin (NIASPAN) 1000 MG CR tablet TAKE (1) TABLET BY MOUTH AT BEDTIME. Patient taking differently: Take 1,000 mg by mouth at bedtime.  10/15/16  Yes Bayard Hugger, MD  nitroGLYCERIN (NITROSTAT) 0.4 MG SL tablet Place 0.4 mg under the tongue every 5 (five) minutes x 3 doses as needed for chest pain. 10/31/15  Yes [provider]  oxyCODONE 10 MG TABS Take 1 tablet (10 mg total) by mouth every 6 (six) hours as needed for severe pain or breakthrough pain (for pain). 08/22/19  Yes Shah, Pratik D, DO  ramipril (ALTACE) 2.5 MG capsule Take 2.5 mg by mouth daily.   Yes [provider]  SYNTHROID 137 MCG tablet  Take 137 mcg by mouth daily before breakfast.  07/20/16  Yes [provider]    Allergies    Patient has no known allergies.  Review of Systems   Review of Systems  Constitutional: Negative for appetite change and fatigue.  HENT: Negative for congestion, ear discharge and sinus pressure.   Eyes: Negative for discharge.  Respiratory: Negative for cough.   Cardiovascular: Negative for chest pain.  Gastrointestinal: Positive for abdominal pain. Negative for diarrhea.  Genitourinary: Negative for frequency and hematuria.  Musculoskeletal: Negative for back pain.  Skin: Negative for rash.  Neurological: Negative for seizures and headaches.  Psychiatric/Behavioral: Negative for hallucinations.    Physical Exam Updated Vital Signs BP (!) 91/59   Pulse 83   Temp  98.7 F (37.1 C) (Oral)   Resp (!) 22   Ht 6\' 2"  (1.88 m)   Wt 96 kg   SpO2 92%   BMI 27.17 kg/m   Physical Exam Constitutional:      Appearance: He is well-developed.  HENT:     Head: Normocephalic.     Nose: Nose normal.  Eyes:     General: No scleral icterus.    Conjunctiva/sclera: Conjunctivae normal.  Neck:     Thyroid: No thyromegaly.  Cardiovascular:     Rate and Rhythm: Normal rate and regular rhythm.     Heart sounds: No murmur heard.  No friction rub. No gallop.   Pulmonary:     Breath sounds: No stridor. No wheezing or rales.  Chest:     Chest wall: No tenderness.  Abdominal:     General: There is no distension.     Tenderness: There is abdominal tenderness. There is no rebound.  Musculoskeletal:        General: Normal range of motion.     Cervical back: Normal range of motion and neck supple.  Lymphadenopathy:     Cervical: No cervical adenopathy.  Skin:    Findings: No erythema or rash.  Neurological:     Mental Status: He is alert and oriented to person, place, and time.     Motor: No abnormal muscle tone.     Coordination: Coordination normal.  Psychiatric:        Behavior:  Behavior normal.     ED Results / Procedures / Treatments   Labs (all labs ordered are listed, but only abnormal results are displayed) Labs Reviewed  CBC WITH DIFFERENTIAL/PLATELET - Abnormal; Notable for the following components:      Result Value   WBC 11.8 (*)    RBC 3.58 (*)    Hemoglobin 12.8 (*)    HCT 38.3 (*)    MCV 107.0 (*)    MCH 35.8 (*)    RDW 16.7 (*)    Neutro Abs 10.7 (*)    Lymphs Abs 0.6 (*)    All other components within normal limits  COMPREHENSIVE METABOLIC PANEL - Abnormal; Notable for the following components:   Potassium 2.8 (*)    CO2 21 (*)    Glucose, Bld 215 (*)    BUN 33 (*)    Creatinine, Ser 2.52 (*)    Calcium 7.8 (*)    Total Protein 5.7 (*)    Albumin 2.0 (*)    AST 116 (*)    ALT 60 (*)    GFR calc non Af Amer 24 (*)    GFR calc Af Amer 28 (*)    All other components within normal limits  SARS CORONAVIRUS 2 BY RT PCR (HOSPITAL ORDER, [place])  LIPASE, BLOOD  URINALYSIS, ROUTINE W REFLEX MICROSCOPIC    EKG None  Radiology CT ABDOMEN PELVIS WO CONTRAST  Result Date: 08/23/2019 CLINICAL DATA:  Diffuse abdominal pain. EXAM: CT ABDOMEN AND PELVIS WITHOUT CONTRAST TECHNIQUE: Multidetector CT imaging of the abdomen and pelvis was performed following the standard protocol without IV contrast. COMPARISON:  CT scan dated 02/16/2007 FINDINGS: Lower chest: There are tiny bilateral pleural effusions. Aortic atherosclerosis. Coronary artery calcifications. CABG. Defibrillator in place. Hepatobiliary: Liver parenchyma is normal. Gallbladder is distended. No bile duct dilatation. Pancreas: Unremarkable. No pancreatic ductal dilatation or surrounding inflammatory changes. Spleen: Spleen is atrophic. Adrenals/Urinary Tract: Normal adrenal glands and kidneys. No hydronephrosis. Bladder is empty. Stomach/Bowel:  There is extensive free air in the abdomen. There is also small amount ascites in the abdomen. There are multiple gas  bubbles in the ascites around the normal with the right lobe of the liver as well as in the mesentery and most prominently around the patent flexure of the colon. This may be the site of perforation. No diverticular disease. There is mucosal thickening of the ascending and hepatic flexure portions of the colon stomach and small bowel appear normal. Appendix is normal. Vascular/Lymphatic: Extensive aortic atherosclerosis. No adenopathy. Reproductive: Prostate is unremarkable. Other: Small ascites. Extensive free air in the abdomen. Slight edema in the subcutaneous fat of the flanks and lateral aspects of the hips. Musculoskeletal: No acute abnormality. Extensive surgical lumbar fusion. IMPRESSION: 1. Extensive free air in the abdomen consistent with perforation of the bowel. The most prominent air in the peritoneal fat is adjacent to the hepatic flexure of the colon and there is edema of the mucosa of the ascending and hepatic flexure portions of the colon suggesting colitis. 2. Tiny bilateral pleural effusions. 3.  Aortic Atherosclerosis (ICD10-I70.0). Critical Value/emergent results were called by telephone at the time of interpretation on 09/11/2019 at 6:55 pm to provider Tanette Chauca , who verbally acknowledged these results. Electronically Signed   By: Lorriane Shire M.D.   On: 09/05/2019 19:03    Procedures Procedures (including critical care time)  Medications Ordered in ED Medications  potassium chloride 10 mEq in 100 mL IVPB (has no administration in time range)  potassium chloride 10 mEq in 100 mL IVPB (has no administration in time range)  sodium chloride 0.9 % bolus 1,000 mL (has no administration in time range)  piperacillin-tazobactam (ZOSYN) IVPB 3.375 g (has no administration in time range)  sodium chloride 0.9 % bolus 500 mL (0 mLs Intravenous Stopped 09/09/2019 1830)    ED Course  I have reviewed the triage vital signs and the nursing notes.  Pertinent labs & imaging results that were  available during my care of the patient were reviewed by me and considered in my medical decision making (see chart for details). CRITICAL CARE Performed by: Milton Ferguson Total critical care time: 40 minutes Critical care time was exclusive of separately billable procedures and treating other patients. Critical care was necessary to treat or prevent imminent or life-threatening deterioration. Critical care was time spent personally by me on the following activities: development of treatment plan with patient and/or surrogate as well as nursing, discussions with consultants, evaluation of patient's response to treatment, examination of patient, obtaining history from patient or surrogate, ordering and performing treatments and interventions, ordering and review of laboratory studies, ordering and review of radiographic studies, pulse oximetry and re-evaluation of patient's condition.    MDM Rules/Calculators/A&P                          CT scan shows bowel obstruction.  General surgery will come see the patient..         This patient presents to the ED for concern of abdominal pain, this involves an extensive number of treatment options, and is a complaint that carries with it a high risk of complications and morbidity.  The differential diagnosis includes appendicitis small bowel obstruction   Lab Tests:   I Ordered, reviewed, and interpreted labs, which included CBC and chemistries where his white count was elevated his kidney function was elevated and his potassium was low  Medicines ordered:   I ordered medication  Zosyn for infection potassium for replacement and IV fluids for dehydration  Imaging Studies ordered:   I ordered imaging studies which included CT scan abdomen and  I independently visualized and interpreted imaging which showed bowel perforation  Additional history obtained:   Additional history obtained from nursing home notes  Previous records obtained  and reviewed   Consultations Obtained:   I consulted general surgery and discussed lab and imaging findings  Reevaluation:  After the interventions stated above, I reevaluated the patient and found unchanged  Critical Interventions: Patient seen by general surgery Dr. Arnoldo Morale that he felt like the patient should go to [facility].  He consult Dr. Kieth Brightly and he accepted the patient to go to the emergency department at [facility].  The ER doc Dr. Theotis Burrow also knows about the patient and has agreed with the transfer. .   Final Clinical Impression(s) / ED Diagnoses Final diagnoses:  Bowel perforation [facility])    Rx / DC Orders ED Discharge Orders    None       Milton Ferguson, MD 08/27/2019 Holli Humbles    Milton Ferguson, MD 09/01/2019 2034

## 2019-08-28 NOTE — ED Notes (Signed)
Placed a purewick on pt for urine sample, no problems placing! Suction works!

## 2019-08-28 NOTE — Consult Note (Signed)
Reason for Consult: free air Referring Physician: Samvel Zinn is an 74 y.o. male.  HPI: 74 yo male with 1 day of abdominal pain, nausea and vomiting. Pain has been getting worse throughout the day. He fell 10 days ago and has had a slow decline of health since then.  Past Medical History:  Diagnosis Date  . CAD (coronary artery disease)   . DM (diabetes mellitus) ([place])    type 2   . DVT (deep venous thrombosis) ([place])   . Hyperlipidemia   . Hypertension   . ICD (implantable cardioverter-defibrillator), single, in situ   . Ischemic cardiomyopathy   . PAF (paroxysmal atrial fibrillation) ([place])   . RBBB   . S/P CABG x 5     Past Surgical History:  Procedure Laterality Date  . BACK SURGERY    . CARDIAC CATHETERIZATION  05/08/2009   small vessel disease  . CARDIAC DEFIBRILLATOR PLACEMENT  05/09/2009   [employer]  . CORONARY ARTERY BYPASS GRAFT  12/31/1995   LIMA to LAD,SVG to intermediate,SVG to CX,seq. svg to posterior descending and posterolateral RCA  . EP IMPLANTABLE DEVICE N/A 09/25/2015   Procedure: PPM/BIV PPM Generator Changeout;  Surgeon: Evans Lance, MD;  Location: [facility];  Service: Cardiovascular;  Laterality: N/A;  . I & D EXTREMITY Left 11/03/2013   Procedure: Left Leg Debride Ulcer, Apply Wound VAC and [place];  Surgeon: Newt Minion, MD;  Location: [facility];  Service: Orthopedics;  Laterality: Left;  . TEE WITHOUT CARDIOVERSION N/A 01/11/2015   Procedure: TRANSESOPHAGEAL ECHOCARDIOGRAM (TEE);  Surgeon: Herminio Commons, MD;  [facility];  Service: Cardiology;  Laterality: N/A;    Family History  Problem Relation Age of Onset  . Heart attack Mother   . Stroke Father     Social History:  reports that he quit smoking about 23 years ago. His smoking use included cigarettes. He has a 52.50 pack-year smoking history. He has never used smokeless tobacco. He reports current alcohol use. He reports that he does not use  drugs.  Allergies: No Known Allergies  Medications: I have reviewed the patient's current medications.  Results for orders placed or performed during the hospital encounter of 09/05/2019 (from the past 48 hour(s))  CBC with Differential/Platelet     Status: Abnormal   Collection Time: 08/25/2019  4:42 PM  Result Value Ref Range   WBC 11.8 (H) 4.0 - 10.5 K/uL   RBC 3.58 (L) 4.22 - 5.81 MIL/uL   Hemoglobin 12.8 (L) 13.0 - 17.0 g/dL   HCT 38.3 (L) 39 - 52 %   MCV 107.0 (H) 80.0 - 100.0 fL   MCH 35.8 (H) 26.0 - 34.0 pg   MCHC 33.4 30.0 - 36.0 g/dL   RDW 16.7 (H) 11.5 - 15.5 %   Platelets 352 150 - 400 K/uL   nRBC 0.0 0.0 - 0.2 %   Neutrophils Relative % 91 %   Neutro Abs 10.7 (H) 1.7 - 7.7 K/uL   Lymphocytes Relative 5 %   Lymphs Abs 0.6 (L) 0.7 - 4.0 K/uL   Monocytes Relative 4 %   Monocytes Absolute 0.4 0 - 1 K/uL   Eosinophils Relative 0 %   Eosinophils Absolute 0.0 0 - 0 K/uL   Basophils Relative 0 %   Basophils Absolute 0.0 0 - 0 K/uL   Immature Granulocytes 0 %   Abs Immature Granulocytes 0.03 0.00 - 0.07 K/uL    Comment: Performed  at [facility], [address] [place], [place] [address]  Comprehensive metabolic panel     Status: Abnormal   Collection Time: 09/01/2019  5:40 PM  Result Value Ref Range   Sodium 138 135 - 145 mmol/L   Potassium 2.8 (L) 3.5 - 5.1 mmol/L   Chloride 103 98 - 111 mmol/L   CO2 21 (L) 22 - 32 mmol/L   Glucose, Bld 215 (H) 70 - 99 mg/dL    Comment: Glucose reference range applies only to samples taken after fasting for at least 8 hours.   BUN 33 (H) 8 - 23 mg/dL   Creatinine, Ser 2.52 (H) 0.61 - 1.24 mg/dL   Calcium 7.8 (L) 8.9 - 10.3 mg/dL   Total Protein 5.7 (L) 6.5 - 8.1 g/dL   Albumin 2.0 (L) 3.5 - 5.0 g/dL   AST 116 (H) 15 - 41 U/L   ALT 60 (H) 0 - 44 U/L   Alkaline Phosphatase 96 38 - 126 U/L   Total Bilirubin 1.1 0.3 - 1.2 mg/dL   GFR calc non Af Amer 24 (L) >60 mL/min   GFR calc Af Amer 28 (L) >60 mL/min   Anion gap 14 5 - 15     Comment: Performed at [facility], [address] [place], [place] [address]  Lipase, blood     Status: None   Collection Time: 09/10/2019  5:40 PM  Result Value Ref Range   Lipase 23 11 - 51 U/L    Comment: Performed at [facility], [address] [place], [place] [address]  Protime-INR     Status: Abnormal   Collection Time: 09/02/2019  5:40 PM  Result Value Ref Range   Prothrombin Time 19.9 (H) 11.4 - 15.2 seconds   INR 1.8 (H) 0.8 - 1.2    Comment: (NOTE) INR goal varies based on device and disease states. Performed at [facility], [address] [place], [place] [address]   APTT     Status: Abnormal   Collection Time: 08/15/2019  5:40 PM  Result Value Ref Range   aPTT 60 (H) 24 - 36 seconds    Comment:        IF BASELINE aPTT IS ELEVATED, SUGGEST PATIENT RISK ASSESSMENT BE USED TO DETERMINE APPROPRIATE ANTICOAGULANT THERAPY. Performed at [facility], [address] [place], [place] [address]   SARS Coronavirus 2 by RT PCR (hospital order, performed in [facility] hospital lab) Nasopharyngeal Nasopharyngeal Swab     Status: None   Collection Time: 08/22/2019  7:00 PM   Specimen: Nasopharyngeal Swab  Result Value Ref Range   SARS Coronavirus 2 NEGATIVE NEGATIVE    Comment: (NOTE) SARS-CoV-2 target nucleic acids are NOT DETECTED.  The SARS-CoV-2 RNA is generally detectable in upper and lower respiratory specimens during the acute phase of infection. The lowest concentration of SARS-CoV-2 viral copies this assay can detect is 250 copies / mL. A negative result does not preclude SARS-CoV-2 infection and should not be used as the sole basis for treatment or other patient management decisions.  A negative result may occur with improper specimen collection / handling, submission of specimen other than nasopharyngeal swab, presence of viral mutation(s) within the areas targeted by this assay, and inadequate number of viral copies (<250 copies / mL). A negative result must be  combined with clinical observations, patient history, and epidemiological information.  Fact Sheet for Patients:   StrictlyIdeas.no  Fact Sheet for Healthcare Providers: BankingDealers.co.za  This test is not yet approved  or  cleared by the [place] and has been authorized for detection and/or diagnosis of SARS-CoV-2 by FDA under an Emergency Use Authorization (EUA).  This EUA will remain in effect (meaning this test can be used) for the duration of the COVID-19 declaration under Section 564(b)(1) of the Act, 21 U.S.C. section 360bbb-3(b)(1), unless the authorization is terminated or revoked sooner.  Performed at [facility], [address] [place], [place] [address]     CT ABDOMEN PELVIS WO CONTRAST  Result Date: 09/07/2019 CLINICAL DATA:  Diffuse abdominal pain. EXAM: CT ABDOMEN AND PELVIS WITHOUT CONTRAST TECHNIQUE: Multidetector CT imaging of the abdomen and pelvis was performed following the standard protocol without IV contrast. COMPARISON:  CT scan dated 02/16/2007 FINDINGS: Lower chest: There are tiny bilateral pleural effusions. Aortic atherosclerosis. Coronary artery calcifications. CABG. Defibrillator in place. Hepatobiliary: Liver parenchyma is normal. Gallbladder is distended. No bile duct dilatation. Pancreas: Unremarkable. No pancreatic ductal dilatation or surrounding inflammatory changes. Spleen: Spleen is atrophic. Adrenals/Urinary Tract: Normal adrenal glands and kidneys. No hydronephrosis. Bladder is empty. Stomach/Bowel: There is extensive free air in the abdomen. There is also small amount ascites in the abdomen. There are multiple gas bubbles in the ascites around the normal with the right lobe of the liver as well as in the mesentery and most prominently around the patent flexure of the colon. This may be the site of perforation. No diverticular disease. There is mucosal thickening of the ascending and hepatic  flexure portions of the colon stomach and small bowel appear normal. Appendix is normal. Vascular/Lymphatic: Extensive aortic atherosclerosis. No adenopathy. Reproductive: Prostate is unremarkable. Other: Small ascites. Extensive free air in the abdomen. Slight edema in the subcutaneous fat of the flanks and lateral aspects of the hips. Musculoskeletal: No acute abnormality. Extensive surgical lumbar fusion. IMPRESSION: 1. Extensive free air in the abdomen consistent with perforation of the bowel. The most prominent air in the peritoneal fat is adjacent to the hepatic flexure of the colon and there is edema of the mucosa of the ascending and hepatic flexure portions of the colon suggesting colitis. 2. Tiny bilateral pleural effusions. 3.  Aortic Atherosclerosis (ICD10-I70.0). Critical Value/emergent results were called by telephone at the time of interpretation on 08/29/2019 at 6:55 pm to provider JOSEPH ZAMMIT , who verbally acknowledged these results. Electronically Signed   By: Lorriane Shire M.D.   On: 08/25/2019 19:03    Review of Systems  Constitutional: Positive for malaise/fatigue. Negative for chills and fever.  HENT: Negative for hearing loss.   Eyes: Negative for blurred vision and double vision.  Respiratory: Negative for cough and hemoptysis.   Cardiovascular: Negative for chest pain and palpitations.  Gastrointestinal: Positive for abdominal pain, nausea and vomiting.  Genitourinary: Negative for dysuria and urgency.  Musculoskeletal: Positive for back pain. Negative for myalgias and neck pain.  Skin: Negative for itching and rash.  Neurological: Positive for weakness. Negative for dizziness, tingling and headaches.  Endo/Heme/Allergies: Does not bruise/bleed easily.  Psychiatric/Behavioral: Negative for depression and suicidal ideas.    PE Blood pressure 92/60, pulse 76, temperature 98.7 F (37.1 C), temperature source Oral, resp. rate (!) 33, height 6\' 2"  (1.88 m), weight 96 kg,  SpO2 91 %. Constitutional: NAD; conversant; no deformities Eyes: Moist conjunctiva; no lid lag; anicteric; PERRL Neck: Trachea midline; no thyromegaly Lungs: Normal respiratory effort; no tactile fremitus CV: RRR; no palpable thrills; no pitting edema GI: Abd distended, tympanic, tender to palpation; no palpable hepatosplenomegaly MSK: unable to assess gait;  no clubbing/cyanosis Psychiatric: Appropriate affect; alert and oriented x3 Lymphatic: No palpable cervical or axillary lymphadenopathy   Assessment/Plan: 74 yo male with free air and multiple medical problems. He received praxbind at [facility]. He received Zosyn at [facility]. He is receiving additional fluid and potassium for hypokalemia. -OR for exploration for pneumoperitoneum -possible ICU admission -medical admission due to multiple comorbidities -discussed multiple complications including death, infection, leak, pneumonia, heart attack, kidney failure, vent dependency, and loss of independence. He and his nephew showed good understanding and wanted to proceed. -because of his multiple medical problems, age, low albumin, and recent falls he has an increased risk of complication and prolonged hospitalization.  Arta Bruce Karalynn Cottone 09/11/2019, 10:53 PM

## 2019-08-28 NOTE — Anesthesia Preprocedure Evaluation (Addendum)
Anesthesia Evaluation  Patient identified by MRN, date of birth, ID band  Reviewed: Allergy & Precautions, NPO status , Patient's Chart, lab work & pertinent test results, reviewed documented beta blocker date and time   History of Anesthesia Complications Negative for: history of anesthetic complications  Airway Mallampati: III  TM Distance: >3 FB Neck ROM: Full    Dental  (+) Edentulous Upper, Dental Advisory Given   Pulmonary shortness of breath, neg sleep apnea, neg COPD, neg recent URI, former smoker,     + decreased breath sounds      Cardiovascular hypertension, Pt. on medications and Pt. on home beta blockers + CAD, + CABG, +CHF and + DVT  + dysrhythmias Atrial Fibrillation + pacemaker + Cardiac Defibrillator  Rhythm:Regular  - Left ventricle: Systolic function was normal. The estimated  ejection fraction was in the range of 50% to 55%.  - Aortic valve: Lambl&'s exrescences noted. No vegetations.  - Aorta: Mild plaque disease.  - Mitral valve: No evidence of vegetation.  - Left atrium: No evidence of thrombus in the atrial cavity or  appendage. Normal pulsed Doppler velocities.  - Right atrium: Pacer wire or catheter noted in right atrium. No  vegetations seen.  - Tricuspid valve: No evidence of vegetation.  - Pulmonic valve: No evidence of vegetation.    Neuro/Psych negative psych ROS   GI/Hepatic Neg liver ROS, Free air in abdomen, distended tense abdomen   Endo/Other  diabetes  Renal/GU ARF and CRFRenal disease     Musculoskeletal negative musculoskeletal ROS (+)   Abdominal (+) + obese,  Abdomen: rigid and tender.    Peds  Hematology  (+) Blood dyscrasia, , Chronic pradaxa, reversed at OSH   Anesthesia Other Findings Medtronic CRT  Reproductive/Obstetrics                           Anesthesia Physical Anesthesia Plan  ASA: IV and emergent  Anesthesia Plan: General    Post-op Pain Management:    Induction: Intravenous, Rapid sequence and Cricoid pressure planned  PONV Risk Score and Plan: 2 and Ondansetron and Dexamethasone  Airway Management Planned: Oral ETT  Additional Equipment: Arterial line  Intra-op Plan:   Post-operative Plan: Possible Post-op intubation/ventilation  Informed Consent: I have reviewed the patients History and Physical, chart, labs and discussed the procedure including the risks, benefits and alternatives for the proposed anesthesia with the patient or authorized representative who has indicated his/her understanding and acceptance.     Dental advisory given  Plan Discussed with: CRNA and Surgeon  Anesthesia Plan Comments:         Anesthesia Quick Evaluation

## 2019-08-28 NOTE — ED Triage Notes (Signed)
Pt sent from [facility]. Had xray of abdominal showing ileus.

## 2019-08-28 NOTE — H&P (Signed)
History and Physical    PLEASE NOTE THAT DRAGON DICTATION SOFTWARE WAS USED IN THE CONSTRUCTION OF THIS NOTE.   Alan Orr ZOX:[id number] DOB: 1946/01/04 DOA: 09/01/2019  PCP: [name], MD Patient coming from: SNF  I have personally briefly reviewed patient's old medical records in [place]  Chief Complaint: Abdominal pain  HPI: Alan Orr is a 74 y.o. male with medical history significant for coronary artery disease status post 5 vessel CABG in 1997, paroxysmal atrial fibrillation chronically anticoagulated on Pradaxa, ventricular tachycardia status post biventricular ICD, hypertension, type 2 diabetes mellitus, stage III chronic kidney disease with baseline creatinine 1.3, who is admitted to [facility] on 08/18/2019 with bowel perforation following ED to ED transfer after initially presenting from SNF to [facility] Emergency Department complaining of abdominal pain.   The patient reports 1 day of progressive sharp, nonradiating, generalized abdominal discomfort, which worsens with palpation of the abdomen.  Denies any associated nausea/vomiting, and is unsure as to the timing of his most recent bowel movement.  Denies any associated subjective fever, chills, rigors, or generalized myalgias. Denies any recent headache, neck stiffness, rhinitis, rhinorrhea, sore throat, sob, or cough. No recent traveling or known COVID-19 exposures. Denies dysuria, gross hematuria, or change in urinary urgency/frequency.  Denies any recent chest pain, diaphoresis, or palpitations.  Denies any recent orthopnea, PND, or peripheral edema.  Past medical history notable for history of paroxysmal atrial fibrillation for which the patient is chronically anticoagulated on Pradaxa.  He believes that his most recent dose of such occurred on the morning of 09/13/2019.  He also has a history of ventricular tachycardia and is status post biventricular ICD placement, with most recent  interrogation appear to have occurred on 06/26/2019.  Additionally, it appears that he underwent generator change for his ICD in July 2017.  Of note, it appears that the patient's most recent echocardiogram occurred in October 2016 at which time TEE showed LVEF 50 to 55% in the absence of any evidence of thrombus vegetation, or significant valvular pathology.  He is also on amiodarone 200 mg p.o. daily. chart review also reveals a history of DVT, although the timing of this diagnosis is currently unclear to me.  Of note, the patient was recently hospitalized at [facility] from 08/18/2019 to 08/22/2019 for further evaluation management of presenting frequent falls.  At that time he was found to have a right humeral fracture, which appears to been managed conservatively.  At the time of discharge from [facility] to SNF, the patient was instructed to follow-up with Dr. Aline Brochure in orthopedic surgery clinic in 1 week and to maintain his right arm in a sling in the interval.  During this previous hospitalization at [facility], the patient was noted to have some initial loose stools, which was not believed to be infectious in nature, and subsequently improved with prn Imodium.      ED Course:  Vital signs in [facility] ED were notable for the following: Temperature max 98.7; heart rate 73-92; blood pressure ranged from 91/59-97/57; respiratory rate 20-32; oxygen saturation 90 to 93% on room air.   Labs performed at [facility] emergency department this evening were notable for the following: CMP notable for sodium 138, potassium 2.8, bicarbonate 21, anion gap 14, BUN 33, creatinine 2.52 relative to most recent prior creatinine data point of 1.28 on 08/22/2019, albumin 2.0, alk phos 96, AST 116, ALT 60, total bilirubin 1.1.  CBC notable for white blood  cell count of 11,800 with 91% neutrophils, which is relative to most recent prior with blood cell count of 8100 on 08/22/2019, hemoglobin 12.8, platelets 352.  INR 1.8.   COVID-19 PCR performed at [facility] emergency department was found to be negative.  CT abdomen/pelvis without contrast performed at [facility] emergency department showed extensive free air in the abdomen consistent with bowel perforation, with most prominent air noted in the peritoneal fat adjacent to the hepatic flexure of the colon.  EGD performed this evening showed sinus rhythm with heart rate 76, right bundle branch block with QRS 164 ms, inverted T waves in V1 through V3, unchanged from most recent prior EKG performed on 08/18/2019, no evidence of ST changes.  Dr. Aviva Signs of general surgery at [facility] discussed the patient's case and imaging with the on-call general surgeon at [facility], Dr. Kieth Brightly, who accepted the patient for ED to ED transfer for further evaluation and management of presenting bowel perforation.  While still in [facility] emergency department, the following were administered: Zosyn 3.375 g IV x 1, Praxabind 5 mg IV x 1, potassium chloride 20 equivalents IV over 2 hours x 1, and a 2 L IV normal saline bolus.  Upon arrival at [facility] emergency department, Dr. Kieth Brightly contacted the hospitalist service, requesting that the later be the admitting service for this patient in the context of several chronic medical problems. Dr. Kieth Brightly stated that general surgery team would consult and conveyed plan to take patient to the OR this evening for exploration of pneumoperitoneum.  While in [facility] emergency department, the patient received an additional 1 L bolus of LR.    Review of Systems: As per HPI otherwise 10 point review of systems negative.   Past Medical History:  Diagnosis Date   CAD (coronary artery disease)    DM (diabetes mellitus) ([place])    type 2    DVT (deep venous thrombosis) (HCC)    Hyperlipidemia    Hypertension    ICD (implantable cardioverter-defibrillator), single, in situ    Ischemic cardiomyopathy    PAF (paroxysmal atrial fibrillation)  (HCC)    RBBB    S/P CABG x 5     Past Surgical History:  Procedure Laterality Date   BACK SURGERY     CARDIAC CATHETERIZATION  05/08/2009   small vessel disease   CARDIAC DEFIBRILLATOR PLACEMENT  05/09/2009   [employer]   CORONARY ARTERY BYPASS GRAFT  12/31/1995   LIMA to LAD,SVG to intermediate,SVG to CX,seq. svg to posterior descending and posterolateral RCA   EP IMPLANTABLE DEVICE N/A 09/25/2015   Procedure: PPM/BIV PPM Generator Changeout;  Surgeon: Evans Lance, MD;  Location: [place] CV LAB;  Service: Cardiovascular;  Laterality: N/A;   I & D EXTREMITY Left 11/03/2013   Procedure: Left Leg Debride Ulcer, Apply Wound VAC and [place];  Surgeon: Newt Minion, MD;  Location: [place];  Service: Orthopedics;  Laterality: Left;   TEE WITHOUT CARDIOVERSION N/A 01/11/2015   Procedure: TRANSESOPHAGEAL ECHOCARDIOGRAM (TEE);  Surgeon: Herminio Commons, MD;  Location: [facility] ENDO SUITE;  Service: Cardiology;  Laterality: N/A;    Social History:  reports that he quit smoking about 23 years ago. His smoking use included cigarettes. He has a 52.50 pack-year smoking history. He has never used smokeless tobacco. He reports current alcohol use. He reports that he does not use drugs.   No Known Allergies  Family History  Problem Relation Age of Onset   Heart attack Mother  Stroke Father     Family history reviewed and not pertinent    Prior to Admission medications   Medication Sig Start Date End Date Taking? Authorizing Provider  acetaminophen (TYLENOL) 325 MG tablet Take 650 mg by mouth every 6 (six) hours as needed for mild pain or moderate pain.   Yes [provider]  ALPRAZolam Duanne Moron) 1 MG tablet Take 1 tablet (1 mg total) by mouth 2 (two) times daily as needed for anxiety or sleep. 08/22/19  Yes Shah, Pratik D, DO  amiodarone (PACERONE) 200 MG tablet Take 200 mg by mouth daily.   Yes [provider]  aspirin EC 81 MG tablet Take 81 mg by mouth at  bedtime.   Yes [provider]  atorvastatin (LIPITOR) 40 MG tablet Take 40 mg by mouth at bedtime.   Yes [provider]  dabigatran (PRADAXA) 75 MG CAPS capsule Take 1 capsule (75 mg total) by mouth every 12 (twelve) hours. 08/16/19  Yes Danford, Suann Larry, MD  ezetimibe (ZETIA) 10 MG tablet TAKE 1 TABLET ONCE DAILY FOR CHOLESTEROL. Patient taking differently: Take 10 mg by mouth daily.  01/04/17  Yes Lorretta Harp, MD  ferrous sulfate 325 (65 FE) MG tablet Take 325 mg by mouth daily with breakfast.   Yes [provider]  loperamide (IMODIUM) 2 MG capsule Take 1 capsule (2 mg total) by mouth as needed for diarrhea or loose stools. 08/22/19  Yes Shah, Pratik D, DO  magnesium oxide (MAG-OX) 400 MG tablet Take 400 mg by mouth daily.   Yes [provider]  metoprolol tartrate (LOPRESSOR) 50 MG tablet TAKE 1 TABLET BY MOUTH TWICE DAILY. Patient taking differently: Take 50 mg by mouth 2 (two) times daily.  10/10/18  Yes Evans Lance, MD  Multiple Vitamin (MULTI-VITAMINS) TABS Take 1 tablet by mouth daily.   Yes [provider]  niacin (NIASPAN) 1000 MG CR tablet TAKE (1) TABLET BY MOUTH AT BEDTIME. Patient taking differently: Take 1,000 mg by mouth at bedtime.  10/15/16  Yes Bayard Hugger, MD  nitroGLYCERIN (NITROSTAT) 0.4 MG SL tablet Place 0.4 mg under the tongue every 5 (five) minutes x 3 doses as needed for chest pain. 10/31/15  Yes [provider]  oxyCODONE 10 MG TABS Take 1 tablet (10 mg total) by mouth every 6 (six) hours as needed for severe pain or breakthrough pain (for pain). 08/22/19  Yes Shah, Pratik D, DO  ramipril (ALTACE) 2.5 MG capsule Take 2.5 mg by mouth daily.   Yes [provider]  SYNTHROID 137 MCG tablet Take 137 mcg by mouth daily before breakfast.  07/20/16  Yes [provider]     Objective    Physical Exam: Vitals:   08/15/2019 2026 09/02/2019 2030 09/12/2019 2101 08/27/2019 2115  BP: (!) 102/28 (!) 92/36  (!) 97/57 92/60  Pulse: 79 76 73 76  Resp: (!) 32 (!) 32 (!) 33 (!) 33  Temp:      TempSrc:      SpO2: 91% 90% (!) 86% 91%  Weight:      Height:        General: appears to be stated age; alert, oriented Skin: warm, dry, no rash Head:  AT/ Eyes:  PEARL b/l, EOMI Mouth:  Oral mucosa membranes appear dry, normal dentition Neck: supple; trachea midline Heart:  RRR; did not appreciate any M/R/G Lungs: CTAB, did not appreciate any wheezes, rales, or rhonchi Abdomen: generalized tenderness with evidence of rebound discomfort; mild  distention. Vascular: 2+ pedal pulses b/l; 2+ radial pulses b/l Extremities: no peripheral edema, no muscle wasting Neuro: strength and sensation intact in upper and lower extremities b/l    Labs on Admission: I have personally reviewed following labs and imaging studies  CBC: Recent Labs  Lab 08/22/19 0459 08/29/2019 1642  WBC 8.1 11.8*  NEUTROABS  --  10.7*  HGB 9.6* 12.8*  HCT 28.9* 38.3*  MCV 105.5* 107.0*  PLT 276 425   Basic Metabolic Panel: Recent Labs  Lab 08/22/19 0459 08/16/2019 1740  NA 139 138  K 3.4* 2.8*  CL 108 103  CO2 26 21*  GLUCOSE 93 215*  BUN 30* 33*  CREATININE 1.28* 2.52*  CALCIUM 8.1* 7.8*   GFR: Estimated Creatinine Clearance: 29.9 mL/min (A) (by C-G formula based on SCr of 2.52 mg/dL (H)). Liver Function Tests: Recent Labs  Lab 08/19/2019 1740  AST 116*  ALT 60*  ALKPHOS 96  BILITOT 1.1  PROT 5.7*  ALBUMIN 2.0*   Recent Labs  Lab 08/27/2019 1740  LIPASE 23   No results for input(s): AMMONIA in the last 168 hours. Coagulation Profile: Recent Labs  Lab 08/27/2019 1740  INR 1.8*   Cardiac Enzymes: No results for input(s): CKTOTAL, CKMB, CKMBINDEX, TROPONINI in the last 168 hours. BNP (last 3 results) No results for input(s): PROBNP in the last 8760 hours. HbA1C: No results for input(s): HGBA1C in the last 72 hours. CBG: Recent Labs  Lab 08/22/19 0717 08/22/19 1101 08/22/19 1629  GLUCAP 84 106*  102*   Lipid Profile: No results for input(s): CHOL, HDL, LDLCALC, TRIG, CHOLHDL, LDLDIRECT in the last 72 hours. Thyroid Function Tests: No results for input(s): TSH, T4TOTAL, FREET4, T3FREE, THYROIDAB in the last 72 hours. Anemia Panel: No results for input(s): VITAMINB12, FOLATE, FERRITIN, TIBC, IRON, RETICCTPCT in the last 72 hours. Urine analysis:    Component Value Date/Time   COLORURINE YELLOW 08/18/2019 1138   APPEARANCEUR CLEAR 08/18/2019 1138   LABSPEC 1.020 08/18/2019 1138   PHURINE 5.0 08/18/2019 1138   GLUCOSEU NEGATIVE 08/18/2019 1138   HGBUR LARGE (A) 08/18/2019 1138   BILIRUBINUR NEGATIVE 08/18/2019 1138   KETONESUR 5 (A) 08/18/2019 1138   PROTEINUR NEGATIVE 08/18/2019 1138   UROBILINOGEN 0.2 01/05/2015 1245   NITRITE NEGATIVE 08/18/2019 1138   LEUKOCYTESUR NEGATIVE 08/18/2019 1138    Radiological Exams on Admission: CT ABDOMEN PELVIS WO CONTRAST  Result Date: 09/09/2019 CLINICAL DATA:  Diffuse abdominal pain. EXAM: CT ABDOMEN AND PELVIS WITHOUT CONTRAST TECHNIQUE: Multidetector CT imaging of the abdomen and pelvis was performed following the standard protocol without IV contrast. COMPARISON:  CT scan dated 02/16/2007 FINDINGS: Lower chest: There are tiny bilateral pleural effusions. Aortic atherosclerosis. Coronary artery calcifications. CABG. Defibrillator in place. Hepatobiliary: Liver parenchyma is normal. Gallbladder is distended. No bile duct dilatation. Pancreas: Unremarkable. No pancreatic ductal dilatation or surrounding inflammatory changes. Spleen: Spleen is atrophic. Adrenals/Urinary Tract: Normal adrenal glands and kidneys. No hydronephrosis. Bladder is empty. Stomach/Bowel: There is extensive free air in the abdomen. There is also small amount ascites in the abdomen. There are multiple gas bubbles in the ascites around the normal with the right lobe of the liver as well as in the mesentery and most prominently around the patent flexure of the colon. This may  be the site of perforation. No diverticular disease. There is mucosal thickening of the ascending and hepatic flexure portions of the colon stomach and small bowel appear normal. Appendix is normal. Vascular/Lymphatic: Extensive aortic atherosclerosis. No adenopathy. Reproductive:  Prostate is unremarkable. Other: Small ascites. Extensive free air in the abdomen. Slight edema in the subcutaneous fat of the flanks and lateral aspects of the hips. Musculoskeletal: No acute abnormality. Extensive surgical lumbar fusion. IMPRESSION: 1. Extensive free air in the abdomen consistent with perforation of the bowel. The most prominent air in the peritoneal fat is adjacent to the hepatic flexure of the colon and there is edema of the mucosa of the ascending and hepatic flexure portions of the colon suggesting colitis. 2. Tiny bilateral pleural effusions. 3.  Aortic Atherosclerosis (ICD10-I70.0). Critical Value/emergent results were called by telephone at the time of interpretation on 08/27/2019 at 6:55 pm to provider JOSEPH ZAMMIT , who verbally acknowledged these results. Electronically Signed   By: Lorriane Shire M.D.   On: 09/02/2019 19:03     EKG: Independently reviewed, with result as described above.    Assessment/Plan   Alan Orr is a 74 y.o. male with medical history significant for coronary artery disease status post 5 vessel CABG in 1997, paroxysmal atrial fibrillation chronically anticoagulated on Pradaxa, ventricular tachycardia status post biventricular ICD, hypertension, type 2 diabetes mellitus, stage III chronic kidney disease with baseline creatinine 1.3, who is admitted to [facility] on 09/06/2019 with bowel perforation following ED to ED transfer after initially presenting from SNF to [facility] Emergency Department complaining of abdominal pain.     Principal Problem:   Bowel perforation ([place]) Active Problems:   Type 2 diabetes mellitus with stage 3 chronic kidney disease  (HCC)   Paroxysmal atrial fibrillation (HCC)   Severe sepsis (HCC)   AKI (acute kidney injury) ([place])   Hypokalemia   #) Large bowel perforation: Diagnosis on the basis of 1 day of progressive diffuse abdominal discomfort with distention, with presenting CT abdomen/pelvis showing evidence of free air in the abdomen consistent with bowel perforation, as further qualified above. Per discussions with Dr. Kieth Brightly of general surgery, hospitalist service to admit and general surgery team to consult, with plan to take patient to the OR this evening for exploration of pneumoperitoneum.  Of note, presenting EKG shows no evidence of acute MI, and presentation not consistent with any clinical evidence to suggest acutely decompensated heart failure at this time. In the context of being chronically anticoagulated on Pradaxa, Praxbind administered this evening, as further described above.  Presentation meets criteria for sepsis in the context of tachypnea and leukocytosis, although further evaluation of such will likely be delayed as further described below, as the patient is being urgently taken to the OR for the above.  Of note the patient received a dose of Zosyn in the emergency department in any pain this evening.  Mild hypotension noted, with systolic blood pressures in the 90s, appearing stable in the setting of multiple IV fluid boluses, as further described above.  Plan: Dr. Kieth Brightly of general surgery consulted, with plan to take patient to the OR tonight for exploration of pneumoperitoneum.  NPO.  Repeat CMP in the morning as well as serum magnesium level at that time.  Repeat CBC with differential in the morning.  Evaluation and management of suspected associated sepsis, as further described below, including continuation of Zosyn.  Check blood cultures x2 and LA.       #) Severe sepsis: SIRS criteria met via presenting leukocytosis and tachypnea, with concern for potential intra-abdominal source of  infection in the context of presenting bowel perforation, as further described above.  Criteria are met for the patient's sepsis to be  considered severe in nature in the context of concomitant evidence of endorgan damage in the form of presenting acute kidney injury. The patient has received a total of 3.5 L of IV fluid bolus, and received a dose of Zosyn prior to transfer from [facility] emergency department.  As the patient is currently being taken to the OR for urgent exploration of pneumoperitoneum, there may be a delay in further evaluation management of presenting severe sepsis, as further described below.  No other overt source of additional underlying infectious process at this time.   Plan: Check stat lactic acid.  Check blood cultures x2 STAT. continue Zosyn.  Repeat CBC with differential in the morning.  Check urinalysis.  Source control via general surgery consultation, with plans for the patient be taken to the OR this evening for expiration of pneumoperitoneum, as further described above.     #) Hypokalemia: Presenting serum potassium noted to be 2.8.  Patient is in the process of receiving 20 mEq of IV potassium chloride.  Presenting EKG, as further described above.  Plan: Potassium chloride 20 mEq IV over 2 hours x 1 dose now, with plan for repeat CMP postoperatively.  Add on serum magnesium level and supplement on a as needed basis in order to maintain serum magnesium level of greater than or equal to 2.0, particularly in the setting of a known history of ventricular tachycardia.  Monitor on telemetry.      #) Acute kidney injury superimposed on stage III chronic kidney disease: In the context of a history of stage III chronic kidney disease with baseline creatinine of 1.3, presenting serum creatinine noted to be elevated at 2.52 relative to most recent prior serum creatinine value of 1.28 on 08/22/2019.  Suspect that this is prerenal in nature on the basis of intravascular depletion  stemming from presenting severe sepsis due to intra-abdominal infection as a result of bowel perforation, as further qualified above.  Plan: Check urinalysis with microscopic evaluation. Will also check random urine sodium/random urine creatinine.  Monitor strict I's and O's and daily weights.  Attempt to avoid nephrotoxic agents.  Repeat BMP in the morning.  Source control for contributory severe sepsis due to intra-abdominal infection, as further described above.  Anticipate additional IV fluids postoperatively.  Hold home ramipril.      #) Paroxysmal atrial fibrillation: Documented history of such. In the setting of a CHA2DS2-VASc score of 7, there is an indication for the patient to be on chronic anticoagulation for thromboembolic prophylaxis. Consistent with this, the patient is chronically anticoagulated on Pradaxa. Home AV nodal blocking regimen: Metoprolol tartrate 50 mg p.o. twice daily.  Most recent echocardiogram appears to have occurred in October 2016, with results as further described above. Appears to be in sinus rhythm at this time.  Of note, the patient is also on daily oral amiodarone, although it is not currently clear to me if this is for the purpose of rhythm control in the setting of history of paroxysmal atrial fibrillation or for the purpose of secondary prevention in the context of a known history of ventricular tachycardia.  In the setting of need for urgent surgical exploration of pneumoperitoneum this evening, the patient received Praxbind in [facility] ED prior to transfer.    Plan: monitor strict I's & O's and daily weights.  Additional work-up and management of hypokalemia, as further described above, including repeat post-op BMP.  Add on serum magnesium level with prn supplementation, as above. Repeat serum magnesium level in the AM.  Hold home metoprolol tartrate in the context of current n.p.o. status.  Hold home Pradaxa in the setting of urgent surgical intervention engine, as  above.  Monitor on telemetry. Will attempt gentle chart review for determination of indication for outpatient amiodarone as rhythm control in the setting of paroxysmal atrial fibrillation versus rhythm control in the setting of a history of ventricular tachycardia.     #) History of ventricular tachycardia: Status post biventricular ICD placement, with generator change in July 2017, and most recent interrogation appearing to have occurred in April 2021.  Most recent echocardiogram in October 2016, with results as further detailed above.  On metoprolol tartrate 50 mg p.o. twice daily as well as amiodarone 200 mg p.o. daily.  Appears to be in sinus rhythm at time of this evening's presentation.  Will closely monitor ensuing electrolytes, including serum potassium as well as serum magnesium levels, with as needed supplementation in order to maintain levels of greater than or equal to 4.0 and 2.0, respectively, in order to decrease likelihood of ventricular arrhythmia, as further described below.  Plan: Work-up and management of presenting hypokalemia, as further described above, including repeat BMP postoperatively.  Add on serum magnesium level, with as needed supplementation, as above.  Monitor on telemetry.  We will hold home metoprolol tartrate and oral amiodarone for now in the context of current n.p.o. status. Will attempt additional chart review to determine if primary indication for patient's amiodarone is his history of ventricular tachycardia versus a means of rhythm control in the context of a history of paroxysmal atrial fibrillation. Will use this information to assist with consideration for discussing case with cardiology to determine if periconversion of amiodarone to IV form is warranted in the setting of current NPO status.      #) Essential hypertension: Outpatient antihypertensive medications include ramipril, metoprolol tartrate.  Borderline hypotensive at time of presentation in the  context of bowel perforation, as above.  Plan: Hold home ramipril and beta-blocker in the setting of current n.p.o. status.      #) Type 2 diabetes mellitus: Managed via lifestyle modifications, with most recent hemoglobin A1c noted to be 6.0% when checked earlier in June 2021.  Not currently on any insulin or oral hypoglycemic agents at home.  Presenting blood sugar noted to be 215, likely representing acute elevation in the setting of physiologic stress stemming from presenting acute bowel perforation.  Plan: In the setting of current n.p.o. status, will order Accu-Cheks every 6 hours with low-dose sliding scale insulin.      #) Acquired hypothyroidism: On Synthroid as an outpatient.  Plan: Hold home Synthroid for now in the setting of current n.p.o. status.     DVT prophylaxis: scd's  Code Status: Full code Family Communication: none Disposition Plan: Per Rounding Team Consults called: Dr. Kieth Brightly of general surgery, as further described above.   Admission status: inpatient; pcu    PLEASE NOTE THAT DRAGON DICTATION SOFTWARE WAS USED IN THE CONSTRUCTION OF THIS NOTE.   Rhetta Mura DO [facility] Pager [phone] From [place]   08/21/2019, 11:18 PM

## 2019-08-28 NOTE — Consult Note (Signed)
Reason for Consult: Perforated viscus, abdominal pain Referring Physician: Dr. Lorn Junes is an 74 y.o. male.  HPI: Patient is a 74 year old white male nursing home patient recently discharged from [facility] due to a right humeral fracture who presented with a 24-hour history of worsening abdominal pain and distention.  A CT scan of the abdomen was performed which revealed significant pneumoperitoneum with the possibility of a perforated ascending colon/hepatic flexure perforation.  Patient has multiple medical problems including coronary artery disease, proximal atrial fibrillation, history of DVT, ICD placement, ischemic cardiomyopathy, diabetes mellitus.  Patient is hard of hearing but answers questions appropriately.  He states he hurts all over his abdomen.  He does not remember the last time he had a bowel movement.  He has been in a nursing home and is on Pradaxa for anticoagulation.  Past Medical History:  Diagnosis Date  . CAD (coronary artery disease)   . DM (diabetes mellitus) ([place])    type 2   . DVT (deep venous thrombosis) ([place])   . Hyperlipidemia   . Hypertension   . ICD (implantable cardioverter-defibrillator), single, in situ   . Ischemic cardiomyopathy   . PAF (paroxysmal atrial fibrillation) ([place])   . RBBB   . S/P CABG x 5     Past Surgical History:  Procedure Laterality Date  . BACK SURGERY    . CARDIAC CATHETERIZATION  05/08/2009   small vessel disease  . CARDIAC DEFIBRILLATOR PLACEMENT  05/09/2009   [employer]  . CORONARY ARTERY BYPASS GRAFT  12/31/1995   LIMA to LAD,SVG to intermediate,SVG to CX,seq. svg to posterior descending and posterolateral RCA  . EP IMPLANTABLE DEVICE N/A 09/25/2015   Procedure: PPM/BIV PPM Generator Changeout;  Surgeon: Evans Lance, MD;  Location: [facility];  Service: Cardiovascular;  Laterality: N/A;  . I & D EXTREMITY Left 11/03/2013   Procedure: Left Leg Debride Ulcer, Apply Wound VAC and [place];   Surgeon: Newt Minion, MD;  Location: [facility];  Service: Orthopedics;  Laterality: Left;  . TEE WITHOUT CARDIOVERSION N/A 01/11/2015   Procedure: TRANSESOPHAGEAL ECHOCARDIOGRAM (TEE);  Surgeon: Herminio Commons, MD;  [facility];  Service: Cardiology;  Laterality: N/A;    Family History  Problem Relation Age of Onset  . Heart attack Mother   . Stroke Father     Social History:  reports that he quit smoking about 23 years ago. His smoking use included cigarettes. He has a 52.50 pack-year smoking history. He has never used smokeless tobacco. He reports current alcohol use. He reports that he does not use drugs.  Allergies: No Known Allergies  Medications: I have reviewed the patient's current medications.  Results for orders placed or performed during the hospital encounter of 08/26/2019 (from the past 48 hour(s))  CBC with Differential/Platelet     Status: Abnormal   Collection Time: 08/27/2019  4:42 PM  Result Value Ref Range   WBC 11.8 (H) 4.0 - 10.5 K/uL   RBC 3.58 (L) 4.22 - 5.81 MIL/uL   Hemoglobin 12.8 (L) 13.0 - 17.0 g/dL   HCT 38.3 (L) 39 - 52 %   MCV 107.0 (H) 80.0 - 100.0 fL   MCH 35.8 (H) 26.0 - 34.0 pg   MCHC 33.4 30.0 - 36.0 g/dL   RDW 16.7 (H) 11.5 - 15.5 %   Platelets 352 150 - 400 K/uL   nRBC 0.0 0.0 - 0.2 %   Neutrophils Relative % 91 %  Neutro Abs 10.7 (H) 1.7 - 7.7 K/uL   Lymphocytes Relative 5 %   Lymphs Abs 0.6 (L) 0.7 - 4.0 K/uL   Monocytes Relative 4 %   Monocytes Absolute 0.4 0 - 1 K/uL   Eosinophils Relative 0 %   Eosinophils Absolute 0.0 0 - 0 K/uL   Basophils Relative 0 %   Basophils Absolute 0.0 0 - 0 K/uL   Immature Granulocytes 0 %   Abs Immature Granulocytes 0.03 0.00 - 0.07 K/uL    Comment: Performed at [facility], [address] [place], [place] [address]  Comprehensive metabolic panel     Status: Abnormal   Collection Time: 08/30/2019  5:40 PM  Result Value Ref Range   Sodium 138 135 - 145 mmol/L   Potassium 2.8 (L) 3.5 - 5.1  mmol/L   Chloride 103 98 - 111 mmol/L   CO2 21 (L) 22 - 32 mmol/L   Glucose, Bld 215 (H) 70 - 99 mg/dL    Comment: Glucose reference range applies only to samples taken after fasting for at least 8 hours.   BUN 33 (H) 8 - 23 mg/dL   Creatinine, Ser 2.52 (H) 0.61 - 1.24 mg/dL   Calcium 7.8 (L) 8.9 - 10.3 mg/dL   Total Protein 5.7 (L) 6.5 - 8.1 g/dL   Albumin 2.0 (L) 3.5 - 5.0 g/dL   AST 116 (H) 15 - 41 U/L   ALT 60 (H) 0 - 44 U/L   Alkaline Phosphatase 96 38 - 126 U/L   Total Bilirubin 1.1 0.3 - 1.2 mg/dL   GFR calc non Af Amer 24 (L) >60 mL/min   GFR calc Af Amer 28 (L) >60 mL/min   Anion gap 14 5 - 15    Comment: Performed at [facility], [address], [place], [place] [address]  Lipase, blood     Status: None   Collection Time: 09/02/2019  5:40 PM  Result Value Ref Range   Lipase 23 11 - 51 U/L    Comment: Performed at [facility], [address], [place], [place] [address]    CT ABDOMEN PELVIS WO CONTRAST  Result Date: 09/09/2019 CLINICAL DATA:  Diffuse abdominal pain. EXAM: CT ABDOMEN AND PELVIS WITHOUT CONTRAST TECHNIQUE: Multidetector CT imaging of the abdomen and pelvis was performed following the standard protocol without IV contrast. COMPARISON:  CT scan dated 02/16/2007 FINDINGS: Lower chest: There are tiny bilateral pleural effusions. Aortic atherosclerosis. Coronary artery calcifications. CABG. Defibrillator in place. Hepatobiliary: Liver parenchyma is normal. Gallbladder is distended. No bile duct dilatation. Pancreas: Unremarkable. No pancreatic ductal dilatation or surrounding inflammatory changes. Spleen: Spleen is atrophic. Adrenals/Urinary Tract: Normal adrenal glands and kidneys. No hydronephrosis. Bladder is empty. Stomach/Bowel: There is extensive free air in the abdomen. There is also small amount ascites in the abdomen. There are multiple gas bubbles in the ascites around the normal with the right lobe of the liver as well as in the mesentery and most prominently  around the patent flexure of the colon. This may be the site of perforation. No diverticular disease. There is mucosal thickening of the ascending and hepatic flexure portions of the colon stomach and small bowel appear normal. Appendix is normal. Vascular/Lymphatic: Extensive aortic atherosclerosis. No adenopathy. Reproductive: Prostate is unremarkable. Other: Small ascites. Extensive free air in the abdomen. Slight edema in the subcutaneous fat of the flanks and lateral aspects of the hips. Musculoskeletal: No acute abnormality. Extensive surgical lumbar fusion. IMPRESSION: 1. Extensive free air in the abdomen  consistent with perforation of the bowel. The most prominent air in the peritoneal fat is adjacent to the hepatic flexure of the colon and there is edema of the mucosa of the ascending and hepatic flexure portions of the colon suggesting colitis. 2. Tiny bilateral pleural effusions. 3.  Aortic Atherosclerosis (ICD10-I70.0). Critical Value/emergent results were called by telephone at the time of interpretation on 08/27/2019 at 6:55 pm to provider JOSEPH ZAMMIT , who verbally acknowledged these results. Electronically Signed   By: Lorriane Shire M.D.   On: 09/05/2019 19:03    ROS:  Pertinent items are noted in HPI.  Blood pressure (!) 91/59, pulse 83, temperature 98.7 F (37.1 C), temperature source Oral, resp. rate (!) 22, height 6\' 2"  (1.88 m), weight 96 kg, SpO2 92 %. Physical Exam: Pleasant white male who is lying in bed no acute distress. Head is normocephalic, atraumatic Lungs are clear to auscultation with your breath sounds bilaterally Heart examination reveals a regular rate and rhythm.  Heart sounds were distant.  AICD is in place left upper chest. Abdomen is distended with tenderness throughout the abdomen to palpation.  Peritoneal signs are present.  No bowel sounds are appreciated.  CT scan images personally reviewed.  Assessment/Plan: Impression: Perforated viscus with  peritonitis.  Patient has multiple comorbidities including history of congestive heart failure, AICD placement, chronic coagulation with Pradaxa, renal insufficiency, hypokalemia, mild dementia, hard of hearing. I discussed the patient with Dr. Olevia Bowens of the hospitalist at [facility].  It is felt that the patient would be better served at [facility] due to his multiple comorbidities and probable postoperative care issues requiring a level of care that is not available at [facility].  I discussed this with Dr. Kieth Brightly of [facility] who agrees to accept the patient in transfer.  We will be giving Praxbind reversal agent.  An ER to ER transfer has been initiated.  He has received Zosyn.  IV fluid boluses are being given.  Patient and family are aware of the significant risk for infection, bleeding, cardiopulmonary difficulties, possible ostomy placement, prolonged intubation, and death.  Patient would like to proceed with surgical intervention.  Aviva Signs 09/04/2019, 8:39 PM

## 2019-08-29 ENCOUNTER — Ambulatory Visit: Payer: Medicare Other | Admitting: Cardiovascular Disease

## 2019-08-29 ENCOUNTER — Emergency Department (HOSPITAL_COMMUNITY): Payer: Medicare Other | Admitting: Registered Nurse

## 2019-08-29 ENCOUNTER — Other Ambulatory Visit: Payer: Self-pay

## 2019-08-29 ENCOUNTER — Inpatient Hospital Stay (HOSPITAL_COMMUNITY): Payer: Medicare Other

## 2019-08-29 ENCOUNTER — Encounter (HOSPITAL_COMMUNITY): Payer: Self-pay | Admitting: Internal Medicine

## 2019-08-29 DIAGNOSIS — I480 Paroxysmal atrial fibrillation: Secondary | ICD-10-CM

## 2019-08-29 DIAGNOSIS — N179 Acute kidney failure, unspecified: Secondary | ICD-10-CM

## 2019-08-29 DIAGNOSIS — R6521 Severe sepsis with septic shock: Secondary | ICD-10-CM

## 2019-08-29 DIAGNOSIS — K631 Perforation of intestine (nontraumatic): Secondary | ICD-10-CM

## 2019-08-29 DIAGNOSIS — A419 Sepsis, unspecified organism: Principal | ICD-10-CM

## 2019-08-29 DIAGNOSIS — E876 Hypokalemia: Secondary | ICD-10-CM | POA: Diagnosis present

## 2019-08-29 LAB — COMPREHENSIVE METABOLIC PANEL
ALT: 91 U/L — ABNORMAL HIGH (ref 0–44)
AST: 245 U/L — ABNORMAL HIGH (ref 15–41)
Albumin: 1.9 g/dL — ABNORMAL LOW (ref 3.5–5.0)
Alkaline Phosphatase: 60 U/L (ref 38–126)
Anion gap: 9 (ref 5–15)
BUN: 29 mg/dL — ABNORMAL HIGH (ref 8–23)
CO2: 20 mmol/L — ABNORMAL LOW (ref 22–32)
Calcium: 6.3 mg/dL — CL (ref 8.9–10.3)
Chloride: 113 mmol/L — ABNORMAL HIGH (ref 98–111)
Creatinine, Ser: 2.48 mg/dL — ABNORMAL HIGH (ref 0.61–1.24)
GFR calc Af Amer: 29 mL/min — ABNORMAL LOW (ref >60)
GFR calc non Af Amer: 25 mL/min — ABNORMAL LOW (ref >60)
Glucose, Bld: 174 mg/dL — ABNORMAL HIGH (ref 70–99)
Potassium: 3 mmol/L — ABNORMAL LOW (ref 3.5–5.1)
Sodium: 142 mmol/L (ref 135–145)
Total Bilirubin: 1.2 mg/dL (ref 0.3–1.2)
Total Protein: 4 g/dL — ABNORMAL LOW (ref 6.5–8.1)

## 2019-08-29 LAB — CBC WITH DIFFERENTIAL/PLATELET
Abs Immature Granulocytes: 0.04 10*3/uL (ref 0.00–0.07)
Basophils Absolute: 0 10*3/uL (ref 0.0–0.1)
Basophils Relative: 0 %
Eosinophils Absolute: 0 10*3/uL (ref 0.0–0.5)
Eosinophils Relative: 0 %
HCT: 25.9 % — ABNORMAL LOW (ref 39.0–52.0)
Hemoglobin: 8.5 g/dL — ABNORMAL LOW (ref 13.0–17.0)
Immature Granulocytes: 1 %
Lymphocytes Relative: 8 %
Lymphs Abs: 0.6 10*3/uL — ABNORMAL LOW (ref 0.7–4.0)
MCH: 35 pg — ABNORMAL HIGH (ref 26.0–34.0)
MCHC: 32.8 g/dL (ref 30.0–36.0)
MCV: 106.6 fL — ABNORMAL HIGH (ref 80.0–100.0)
Monocytes Absolute: 0.2 10*3/uL (ref 0.1–1.0)
Monocytes Relative: 3 %
Neutro Abs: 7.2 10*3/uL (ref 1.7–7.7)
Neutrophils Relative %: 88 %
Platelets: 296 10*3/uL (ref 150–400)
RBC: 2.43 MIL/uL — ABNORMAL LOW (ref 4.22–5.81)
RDW: 16.4 % — ABNORMAL HIGH (ref 11.5–15.5)
WBC Morphology: INCREASED
WBC: 8.1 10*3/uL (ref 4.0–10.5)
nRBC: 0 % (ref 0.0–0.2)

## 2019-08-29 LAB — BASIC METABOLIC PANEL
Anion gap: 8 (ref 5–15)
BUN: 39 mg/dL — ABNORMAL HIGH (ref 8–23)
CO2: 21 mmol/L — ABNORMAL LOW (ref 22–32)
Calcium: 7.1 mg/dL — ABNORMAL LOW (ref 8.9–10.3)
Chloride: 111 mmol/L (ref 98–111)
Creatinine, Ser: 2.94 mg/dL — ABNORMAL HIGH (ref 0.61–1.24)
GFR calc Af Amer: 23 mL/min — ABNORMAL LOW (ref >60)
GFR calc non Af Amer: 20 mL/min — ABNORMAL LOW (ref >60)
Glucose, Bld: 162 mg/dL — ABNORMAL HIGH (ref 70–99)
Potassium: 4.2 mmol/L (ref 3.5–5.1)
Sodium: 140 mmol/L (ref 135–145)

## 2019-08-29 LAB — CBC
HCT: 29.3 % — ABNORMAL LOW (ref 39.0–52.0)
Hemoglobin: 9.6 g/dL — ABNORMAL LOW (ref 13.0–17.0)
MCH: 35.4 pg — ABNORMAL HIGH (ref 26.0–34.0)
MCHC: 32.8 g/dL (ref 30.0–36.0)
MCV: 108.1 fL — ABNORMAL HIGH (ref 80.0–100.0)
Platelets: 367 10*3/uL (ref 150–400)
RBC: 2.71 MIL/uL — ABNORMAL LOW (ref 4.22–5.81)
RDW: 16.4 % — ABNORMAL HIGH (ref 11.5–15.5)
WBC: 12 10*3/uL — ABNORMAL HIGH (ref 4.0–10.5)
nRBC: 0.2 % (ref 0.0–0.2)

## 2019-08-29 LAB — SODIUM, URINE, RANDOM: Sodium, Ur: 28 mmol/L

## 2019-08-29 LAB — POCT I-STAT 7, (LYTES, BLD GAS, ICA,H+H)
Acid-base deficit: 1 mmol/L (ref 0.0–2.0)
Bicarbonate: 25.1 mmol/L (ref 20.0–28.0)
Calcium, Ion: 1.09 mmol/L — ABNORMAL LOW (ref 1.15–1.40)
HCT: 30 % — ABNORMAL LOW (ref 39.0–52.0)
Hemoglobin: 10.2 g/dL — ABNORMAL LOW (ref 13.0–17.0)
O2 Saturation: 100 %
Patient temperature: 33.5
Potassium: 3.3 mmol/L — ABNORMAL LOW (ref 3.5–5.1)
Sodium: 143 mmol/L (ref 135–145)
TCO2: 27 mmol/L (ref 22–32)
pCO2 arterial: 41.9 mmHg (ref 32.0–48.0)
pH, Arterial: 7.368 (ref 7.350–7.450)
pO2, Arterial: 502 mmHg — ABNORMAL HIGH (ref 83.0–108.0)

## 2019-08-29 LAB — GLUCOSE, CAPILLARY
Glucose-Capillary: 175 mg/dL — ABNORMAL HIGH (ref 70–99)
Glucose-Capillary: 195 mg/dL — ABNORMAL HIGH (ref 70–99)
Glucose-Capillary: 151 mg/dL — ABNORMAL HIGH (ref 70–99)
Glucose-Capillary: 144 mg/dL — ABNORMAL HIGH (ref 70–99)
Glucose-Capillary: 185 mg/dL — ABNORMAL HIGH (ref 70–99)

## 2019-08-29 LAB — LACTIC ACID, PLASMA
Lactic Acid, Venous: 2.1 mmol/L (ref 0.5–1.9)
Lactic Acid, Venous: 2 mmol/L (ref 0.5–1.9)

## 2019-08-29 LAB — CREATININE, URINE, RANDOM: Creatinine, Urine: 195.17 mg/dL

## 2019-08-29 LAB — APTT
aPTT: 22 seconds — ABNORMAL LOW (ref 24–36)
aPTT: 52 seconds — ABNORMAL HIGH (ref 24–36)

## 2019-08-29 LAB — PREALBUMIN: Prealbumin: <5 mg/dL — ABNORMAL LOW (ref 18–38)

## 2019-08-29 LAB — MAGNESIUM: Magnesium: 1.7 mg/dL (ref 1.7–2.4)

## 2019-08-29 MED ORDER — ACETAMINOPHEN 160 MG/5ML PO SOLN: 1000.0000 mg | Freq: Once | ORAL | Status: DC | PRN

## 2019-08-29 MED ORDER — PHENYLEPHRINE HCL-NACL 10-0.9 MG/250ML-% IV SOLN
INTRAVENOUS | Status: DC | PRN
  Administered 2019-08-29: 25 ug/min via INTRAVENOUS

## 2019-08-29 MED ORDER — POTASSIUM CHLORIDE IN NACL 40-0.9 MEQ/L-% IV SOLN
INTRAVENOUS | Status: DC
  Filled 2019-08-29: qty 1000

## 2019-08-29 MED ORDER — OXYCODONE HCL 5 MG PO TABS: 5.0000 mg | ORAL_TABLET | Freq: Once | ORAL | Status: DC | PRN

## 2019-08-29 MED ORDER — CALCIUM GLUCONATE-NACL 1-0.675 GM/50ML-% IV SOLN
1.0000 g | Freq: Once | INTRAVENOUS | Status: AC
  Administered 2019-08-29: 1000 mg via INTRAVENOUS
  Filled 2019-08-29: qty 50

## 2019-08-29 MED ORDER — PROPOFOL 10 MG/ML IV BOLUS
INTRAVENOUS | Status: DC | PRN
  Administered 2019-08-29: 50 mg via INTRAVENOUS

## 2019-08-29 MED ORDER — SODIUM CHLORIDE 0.9% FLUSH
10.0000 mL | Freq: Two times a day (BID) | INTRAVENOUS | Status: DC
  Administered 2019-08-29: 20 mL
  Administered 2019-08-30: 10 mL
  Administered 2019-08-31: 20 mL
  Administered 2019-08-31 – 2019-09-03 (×3): 10 mL

## 2019-08-29 MED ORDER — CHLORHEXIDINE GLUCONATE 0.12 % MT SOLN
15.0000 mL | Freq: Two times a day (BID) | OROMUCOSAL | Status: DC
  Administered 2019-08-29 – 2019-09-02 (×9): 15 mL via OROMUCOSAL
  Filled 2019-08-29: qty 15

## 2019-08-29 MED ORDER — INSULIN ASPART 100 UNIT/ML ~~LOC~~ SOLN
0.0000 [IU] | SUBCUTANEOUS | Status: DC
  Administered 2019-08-29 – 2019-08-30 (×3): 2 [IU] via SUBCUTANEOUS
  Administered 2019-08-30: 3 [IU] via SUBCUTANEOUS

## 2019-08-29 MED ORDER — ACETAMINOPHEN 10 MG/ML IV SOLN: 1000.0000 mg | Freq: Once | INTRAVENOUS | Status: DC | PRN

## 2019-08-29 MED ORDER — ACETAMINOPHEN 10 MG/ML IV SOLN
1000.0000 mg | Freq: Four times a day (QID) | INTRAVENOUS | Status: AC
  Administered 2019-08-29 – 2019-08-30 (×4): 1000 mg via INTRAVENOUS
  Filled 2019-08-29 (×4): qty 100

## 2019-08-29 MED ORDER — TRAVASOL 10 % IV SOLN
INTRAVENOUS | Status: AC
  Filled 2019-08-29: qty 528

## 2019-08-29 MED ORDER — MORPHINE SULFATE (PF) 2 MG/ML IV SOLN: 2.0000 mg | INTRAVENOUS | Status: DC | PRN

## 2019-08-29 MED ORDER — POTASSIUM CHLORIDE IN NACL 40-0.9 MEQ/L-% IV SOLN
INTRAVENOUS | Status: AC
  Filled 2019-08-29 (×2): qty 1000

## 2019-08-29 MED ORDER — LIDOCAINE 2% (20 MG/ML) 5 ML SYRINGE
INTRAMUSCULAR | Status: DC | PRN
  Administered 2019-08-29: 60 mg via INTRAVENOUS

## 2019-08-29 MED ORDER — ROCURONIUM BROMIDE 10 MG/ML (PF) SYRINGE
PREFILLED_SYRINGE | INTRAVENOUS | Status: DC | PRN
  Administered 2019-08-29: 50 mg via INTRAVENOUS

## 2019-08-29 MED ORDER — ORAL CARE MOUTH RINSE
15.0000 mL | Freq: Two times a day (BID) | OROMUCOSAL | Status: DC
  Administered 2019-08-29 – 2019-09-02 (×7): 15 mL via OROMUCOSAL

## 2019-08-29 MED ORDER — DEXAMETHASONE SODIUM PHOSPHATE 10 MG/ML IJ SOLN
INTRAMUSCULAR | Status: DC | PRN
Start: 2019-08-29 — End: 2019-08-29
  Administered 2019-08-29: 5 mg via INTRAVENOUS

## 2019-08-29 MED ORDER — INSULIN ASPART 100 UNIT/ML ~~LOC~~ SOLN
0.0000 [IU] | Freq: Four times a day (QID) | SUBCUTANEOUS | Status: DC
  Administered 2019-08-29: 1 [IU] via SUBCUTANEOUS

## 2019-08-29 MED ORDER — SUGAMMADEX SODIUM 200 MG/2ML IV SOLN
INTRAVENOUS | Status: DC | PRN
  Administered 2019-08-29: 100 mg via INTRAVENOUS
  Administered 2019-08-29: 200 mg via INTRAVENOUS

## 2019-08-29 MED ORDER — 0.9 % SODIUM CHLORIDE (POUR BTL) OPTIME
TOPICAL | Status: DC | PRN
  Administered 2019-08-29 (×5): 1000 mL

## 2019-08-29 MED ORDER — ONDANSETRON HCL 4 MG/2ML IJ SOLN
INTRAMUSCULAR | Status: DC | PRN
  Administered 2019-08-29: 4 mg via INTRAVENOUS

## 2019-08-29 MED ORDER — LACTATED RINGERS IV SOLN: INTRAVENOUS | Status: DC | PRN

## 2019-08-29 MED ORDER — CHLORHEXIDINE GLUCONATE CLOTH 2 % EX PADS
6.0000 | MEDICATED_PAD | Freq: Every day | CUTANEOUS | Status: DC
  Administered 2019-08-29 – 2019-09-01 (×4): 6 via TOPICAL

## 2019-08-29 MED ORDER — LACTATED RINGERS IV SOLN: INTRAVENOUS | Status: DC

## 2019-08-29 MED ORDER — SODIUM CHLORIDE 0.9% FLUSH: 10.0000 mL | INTRAVENOUS | Status: DC | PRN

## 2019-08-29 MED ORDER — FENTANYL CITRATE (PF) 100 MCG/2ML IJ SOLN: 25.0000 ug | INTRAMUSCULAR | Status: DC | PRN

## 2019-08-29 MED ORDER — EPHEDRINE SULFATE-NACL 50-0.9 MG/10ML-% IV SOSY
PREFILLED_SYRINGE | INTRAVENOUS | Status: DC | PRN
  Administered 2019-08-29 (×3): 10 mg via INTRAVENOUS

## 2019-08-29 MED ORDER — FENTANYL CITRATE (PF) 250 MCG/5ML IJ SOLN
INTRAMUSCULAR | Status: DC | PRN
  Administered 2019-08-29: 100 ug via INTRAVENOUS

## 2019-08-29 MED ORDER — SODIUM CHLORIDE 0.9 % IV BOLUS
1000.0000 mL | Freq: Once | INTRAVENOUS | Status: AC
  Administered 2019-08-29: 1000 mL via INTRAVENOUS

## 2019-08-29 MED ORDER — PIPERACILLIN-TAZOBACTAM 3.375 G IVPB
3.3750 g | Freq: Three times a day (TID) | INTRAVENOUS | Status: DC
  Administered 2019-08-29 – 2019-09-02 (×13): 3.375 g via INTRAVENOUS
  Filled 2019-08-29 (×16): qty 50

## 2019-08-29 MED ORDER — ONDANSETRON HCL 4 MG/2ML IJ SOLN: 4.0000 mg | Freq: Four times a day (QID) | INTRAMUSCULAR | Status: DC | PRN

## 2019-08-29 MED ORDER — ALBUMIN HUMAN 5 % IV SOLN
INTRAVENOUS | Status: DC | PRN
Start: 2019-08-29 — End: 2019-08-29

## 2019-08-29 MED ORDER — NOREPINEPHRINE 4 MG/250ML-% IV SOLN
0.0000 ug/min | INTRAVENOUS | Status: DC
  Administered 2019-08-29: 2 ug/min via INTRAVENOUS
  Filled 2019-08-29 (×2): qty 250

## 2019-08-29 MED ORDER — POTASSIUM CHLORIDE 10 MEQ/100ML IV SOLN
10.0000 meq | INTRAVENOUS | Status: AC
  Administered 2019-08-29 (×4): 10 meq via INTRAVENOUS
  Filled 2019-08-29 (×4): qty 100

## 2019-08-29 MED ORDER — MAGNESIUM SULFATE 2 GM/50ML IV SOLN
2.0000 g | Freq: Once | INTRAVENOUS | Status: AC
  Administered 2019-08-29: 2 g via INTRAVENOUS
  Filled 2019-08-29: qty 50

## 2019-08-29 MED ORDER — ACETAMINOPHEN 500 MG PO TABS: 1000.0000 mg | ORAL_TABLET | Freq: Once | ORAL | Status: DC | PRN

## 2019-08-29 MED ORDER — PHENYLEPHRINE 40 MCG/ML (10ML) SYRINGE FOR IV PUSH (FOR BLOOD PRESSURE SUPPORT)
PREFILLED_SYRINGE | INTRAVENOUS | Status: DC | PRN
  Administered 2019-08-29 (×2): 80 ug via INTRAVENOUS
  Administered 2019-08-29 (×4): 120 ug via INTRAVENOUS
  Administered 2019-08-29: 80 ug via INTRAVENOUS

## 2019-08-29 MED ORDER — SODIUM CHLORIDE 0.9% FLUSH
3.0000 mL | Freq: Two times a day (BID) | INTRAVENOUS | Status: DC
  Administered 2019-08-29 – 2019-09-02 (×6): 3 mL via INTRAVENOUS

## 2019-08-29 MED ORDER — PIPERACILLIN-TAZOBACTAM 3.375 G IVPB
3.3750 g | INTRAVENOUS | Status: AC
  Administered 2019-08-29: 3.375 g via INTRAVENOUS
  Filled 2019-08-29: qty 50

## 2019-08-29 MED ORDER — ALBUMIN HUMAN 25 % IV SOLN
12.5000 g | Freq: Once | INTRAVENOUS | Status: AC
  Administered 2019-08-29: 12.5 g via INTRAVENOUS
  Filled 2019-08-29: qty 50

## 2019-08-29 MED ORDER — SUCCINYLCHOLINE CHLORIDE 200 MG/10ML IV SOSY
PREFILLED_SYRINGE | INTRAVENOUS | Status: DC | PRN
  Administered 2019-08-29: 120 mg via INTRAVENOUS

## 2019-08-29 MED ORDER — OXYCODONE HCL 5 MG/5ML PO SOLN: 5.0000 mg | Freq: Once | ORAL | Status: DC | PRN

## 2019-08-29 MED ORDER — SODIUM CHLORIDE 0.9 % IV BOLUS
500.0000 mL | Freq: Once | INTRAVENOUS | Status: AC
  Administered 2019-08-29: 500 mL via INTRAVENOUS

## 2019-08-29 NOTE — Progress Notes (Addendum)
Pt bp 87/38. No complaints of dizziness, but pt is drowsy with little urine output. Dr. Jacinta Shoe immediately text-paged. Order for 500 mL NS ordered and started.  1316: Bp rechecked at 79/49. Additional 1 L NS bolus ordered and started at 500 mL/hr. Pt is pale, drowsy, has cool extremities, and hgb of 8.5 this am. CBC ordered to be drawn at 1700. Pt continues to have very little urine output of about 25 mL this shift. Bladder scan showed 0 mL.    Dr. Broadus John consulted CCM. Georgann Housekeeper, NP, presented to pt room. Orders placed to transfer pt to ICU. Report given to Otelia Santee RN. All belongings packed and pt escorted to [id number].  Justice Rocher, RN

## 2019-08-29 NOTE — Progress Notes (Signed)
[facility] Progress Note  1 Day Post-Op  Subjective: Patient reports abdominal pain this AM. Low UOP. BP soft. No bowel function yet.  Objective: Vital signs in last 24 hours: Temp:  [97.2 F (36.2 C)-98.7 F (37.1 C)] 98 F (36.7 C) (06/15 0808) Pulse Rate:  [4-92] 71 (06/15 0808) Resp:  [13-33] 13 (06/15 0808) BP: (86-138)/(28-63) 101/36 (06/15 0808) SpO2:  [86 %-100 %] 92 % (06/15 0808) Arterial Line BP: (113-120)/(40-53) 118/53 (06/15 0300) Weight:  [96 kg-97 kg] 97 kg (06/15 0500) Last BM Date:  (PTA)  Intake/Output from previous day: 06/14 0701 - 06/15 0700 In: 2425 [I.V.:1075; IV Piggyback:1350] Out: 475 [Urine:25; Blood:100] Intake/Output this shift: No intake/output data recorded.  PE: General: pleasant, WD, obese white male who is laying in bed in NAD HEENT: Sclera are noninjected.  PERRL.  Ears and nose without any masses or lesions.  Mouth is pink and moist Heart: regular, rate, and rhythm. Palpable radial and pedal pulses bilaterally Lungs: soft rales in bases bilaterally, nasal cannula present, some accessory muscle usage Abd: soft, appropriately tender, ND, BS hypoactive, ileostomy red without output, midline wound clean    Lab Results:  Recent Labs    08/24/2019 1642 08/18/2019 1642 08/29/19 0044 08/29/19 0549  WBC 11.8*  --   --  8.1  HGB 12.8*   < > 10.2* 8.5*  HCT 38.3*   < > 30.0* 25.9*  PLT 352  --   --  296   < > = values in this interval not displayed.   BMET Recent Labs    08/29/2019 1740 09/12/2019 1740 08/29/19 0044 08/29/19 0549  NA 138   < > 143 142  K 2.8*   < > 3.3* 3.0*  CL 103  --   --  113*  CO2 21*  --   --  20*  GLUCOSE 215*  --   --  174*  BUN 33*  --   --  29*  CREATININE 2.52*  --   --  2.48*  CALCIUM 7.8*  --   --  6.3*   < > = values in this interval not displayed.   PT/INR Recent Labs    09/12/2019 1740  LABPROT 19.9*  INR 1.8*   CMP     Component Value Date/Time   NA 142 08/29/2019 0549   NA  144 06/26/2019 0930   K 3.0 (L) 08/29/2019 0549   CL 113 (H) 08/29/2019 0549   CO2 20 (L) 08/29/2019 0549   GLUCOSE 174 (H) 08/29/2019 0549   BUN 29 (H) 08/29/2019 0549   BUN 15 06/26/2019 0930   CREATININE 2.48 (H) 08/29/2019 0549   CREATININE 1.42 (H) 09/20/2015 1004   CALCIUM 6.3 (LL) 08/29/2019 0549   PROT 4.0 (L) 08/29/2019 0549   PROT 6.6 06/26/2019 0930   ALBUMIN 1.9 (L) 08/29/2019 0549   ALBUMIN 3.6 (L) 06/26/2019 0930   AST 245 (H) 08/29/2019 0549   ALT 91 (H) 08/29/2019 0549   ALKPHOS 60 08/29/2019 0549   BILITOT 1.2 08/29/2019 0549   BILITOT 0.6 06/26/2019 0930   GFRNONAA 25 (L) 08/29/2019 0549   GFRAA 29 (L) 08/29/2019 0549   Lipase     Component Value Date/Time   LIPASE 23 08/17/2019 1740       Studies/Results: CT ABDOMEN PELVIS WO CONTRAST  Result Date: 08/18/2019 CLINICAL DATA:  Diffuse abdominal pain. EXAM: CT ABDOMEN AND PELVIS WITHOUT CONTRAST TECHNIQUE: Multidetector CT imaging of the abdomen and pelvis was performed following  the standard protocol without IV contrast. COMPARISON:  CT scan dated 02/16/2007 FINDINGS: Lower chest: There are tiny bilateral pleural effusions. Aortic atherosclerosis. Coronary artery calcifications. CABG. Defibrillator in place. Hepatobiliary: Liver parenchyma is normal. Gallbladder is distended. No bile duct dilatation. Pancreas: Unremarkable. No pancreatic ductal dilatation or surrounding inflammatory changes. Spleen: Spleen is atrophic. Adrenals/Urinary Tract: Normal adrenal glands and kidneys. No hydronephrosis. Bladder is empty. Stomach/Bowel: There is extensive free air in the abdomen. There is also small amount ascites in the abdomen. There are multiple gas bubbles in the ascites around the normal with the right lobe of the liver as well as in the mesentery and most prominently around the patent flexure of the colon. This may be the site of perforation. No diverticular disease. There is mucosal thickening of the ascending and  hepatic flexure portions of the colon stomach and small bowel appear normal. Appendix is normal. Vascular/Lymphatic: Extensive aortic atherosclerosis. No adenopathy. Reproductive: Prostate is unremarkable. Other: Small ascites. Extensive free air in the abdomen. Slight edema in the subcutaneous fat of the flanks and lateral aspects of the hips. Musculoskeletal: No acute abnormality. Extensive surgical lumbar fusion. IMPRESSION: 1. Extensive free air in the abdomen consistent with perforation of the bowel. The most prominent air in the peritoneal fat is adjacent to the hepatic flexure of the colon and there is edema of the mucosa of the ascending and hepatic flexure portions of the colon suggesting colitis. 2. Tiny bilateral pleural effusions. 3.  Aortic Atherosclerosis (ICD10-I70.0). Critical Value/emergent results were called by telephone at the time of interpretation on 09/05/2019 at 6:55 pm to provider JOSEPH ZAMMIT , who verbally acknowledged these results. Electronically Signed   By: Lorriane Shire M.D.   On: 09/04/2019 19:03   DG Chest Port 1 View  Result Date: 08/29/2019 CLINICAL DATA:  NG tube placement EXAM: PORTABLE CHEST 1 VIEW COMPARISON:  12/25/2017 FINDINGS: NG tube is in place with the tip in the stomach. A ICD within the heart, unchanged. Prior CABG. IMPRESSION: NG tube tip in the stomach. Electronically Signed   By: Rolm Baptise M.D.   On: 08/29/2019 02:38    Anti-infectives: Anti-infectives (From admission, onward)   Start     Dose/Rate Route Frequency Ordered Stop   08/29/19 1200  piperacillin-tazobactam (ZOSYN) IVPB 3.375 g     Discontinue     3.375 g 12.5 mL/hr over 240 Minutes Intravenous Every 8 hours 08/29/19 0405     08/29/19 0415  piperacillin-tazobactam (ZOSYN) IVPB 3.375 g     Discontinue     3.375 g 12.5 mL/hr over 240 Minutes Intravenous STAT 08/29/19 0405 08/30/19 0500   08/21/2019 1915  piperacillin-tazobactam (ZOSYN) IVPB 3.375 g        3.375 g 100 mL/hr over 30  Minutes Intravenous  Once 08/29/2019 1905 08/29/2019 2136       Assessment/Plan CAD s/p CABGx5 PAF - normally on pradaxa Biventricular ICD Ischemic cardiomyopathy HTN HLD AKI on Stage III CKD - Cr 2.48, UOP low Hx of DVT T2DM - SSI Hypothyroidism Hypokalemia/hypocalcemia - replace IV  Septic shock - not on pressors, BP ok, continue to monitor as resuscitation efforts continue Perforated R colon with feculent peritonitis 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 Kieth Brightly - POD#0 - Continue NGT on LIWS and await bowel function - continue IV abx, high risk for post-op abscess - check pre-albumin, may start TPN for expected ileus  - PT/OT consults - BID dressing changes - WOC consult for new ileostomy - continue foley  FEN: NPO, IVF, NGT to LIWS; replace lytes VTE: SCDs, start chemical prophylaxis tomorrow if plts stable ID: IV Zosyn 6/14>>  LOS: 1 day    Norm Parcel , [facility] 08/29/2019, 8:11 AM Please see Amion for pager number during day hours 7:00am-4:30pm

## 2019-08-29 NOTE — Progress Notes (Signed)
PHARMACY - TOTAL PARENTERAL NUTRITION CONSULT NOTE   Indication: Prolonged ileus  Patient Measurements: Height: 6\' 2"  (188 cm) Weight: 97 kg (213 lb 13.5 oz) IBW/kg (Calculated) : 82.2 TPN AdjBW (KG): 96 Body mass index is 27.46 kg/m.  Assessment: 45 YOM who presents with abdominal pain, N/V found to have perforated colon and feculent peritonitis now s/p R hemicolectomy with end ileostomy. Given anticipated prolonged ileus with poor baseline nutritional status indicated by a prealbumin < 5, pharmacy consulted to start TPN while allowing for bowel rest.   Glucose / Insulin: A1c 6, CBGs mildly elevated while admitted. Currently of very sSSI  Electrolytes: K 3, Mg 1.7, CO2 20, corrCa 8, other electrolytes wnl  Renal: SCr 2.48 LFTs / TGs: LFTs elevated, Tbili wnl Prealbumin / albumin: Pre-albumin < 5, albumin on admission 2 Intake / Output; MIVF: Minimal UOP, on MIVF of NS w/ 40 meQ KCl x 100 mL/hr  GI Imaging: ID: on Zosyn for intra-abdominal infection  Surgeries / Procedures:  6/15: R hemicolectomy with end ileostomy   Central access: DL CVC TPN start date: 6/15 >>   Nutritional Goals : RD recommendations pending   Current Nutrition:  NPO  Plan:  Start TPN at 40 mL/hr at 1800 Electrolytes in TPN: 76mEq/L of Na, 97mEq/L of K, 24mEq/L of Ca, 23mEq/L of Mg, and 15mmol/L of Phos. Cl:Ac ratio 1:1 Add standard MVI and trace elements to TPN Initiate Sensitive q4h SSI and adjust as needed  Reduce MIVF to 60 mL/hr at 1800 Monitor TPN labs on Mon/Thurs   Albertina Parr, PharmD., BCPS, BCCCP Clinical Pharmacist Clinical phone for 08/29/19 until 3:30pm: [phone] If after 3:30pm, please refer to [facility] for unit-specific pharmacist

## 2019-08-29 NOTE — Anesthesia Postprocedure Evaluation (Signed)
Anesthesia Post Note  Patient: Keen Ewalt Melin  Procedure(s) Performed: EXPLORATORY LAPAROTOMY, (N/A Abdomen) RIGHT COLECTOMY (Right Abdomen) ILEOSTOMY (Right Abdomen)     Patient location during evaluation: PACU Anesthesia Type: General Level of consciousness: awake and alert Pain management: pain level controlled Vital Signs Assessment: post-procedure vital signs reviewed and stable Respiratory status: spontaneous breathing, nonlabored ventilation, respiratory function stable and patient connected to nasal cannula oxygen Cardiovascular status: blood pressure returned to baseline and stable Postop Assessment: no apparent nausea or vomiting Anesthetic complications: no   No complications documented.  Last Vitals:  Vitals:   08/29/19 0345 08/29/19 0808  BP:  (!) 101/36  Pulse: 69 71  Resp: 19 13  Temp:  36.7 C  SpO2: 97% 92%    Last Pain:  Vitals:   08/29/19 0808  TempSrc: Oral  PainSc:                  Danyell Shader

## 2019-08-29 NOTE — OR Nursing (Signed)
BP cuff to right lower leg, BP 86/35.  Dr. Kieth Brightly paged and updated.  4NP does not accept arterial lines.  Per Dr. Kieth Brightly. Place BP cuff to left upper arm, if cuff pressure consistent with arterial line, d/c arterial line.

## 2019-08-29 NOTE — Transfer of Care (Signed)
Immediate Anesthesia Transfer of Care Note  Patient: [name]  Procedure(s) Performed: EXPLORATORY LAPAROTOMY, (N/A Abdomen) RIGHT COLECTOMY (Right Abdomen) ILEOSTOMY (Right Abdomen)  Patient Location: PACU  Anesthesia Type:General  Level of Consciousness: awake, alert  and oriented  Airway & Oxygen Therapy: Patient Spontanous Breathing and Patient connected to face mask oxygen  Post-op Assessment: Report given to RN and Post -op Vital signs reviewed and stable  Post vital signs: Reviewed and stable  Last Vitals:  Vitals Value Taken Time  BP 127/44 ABP   Temp    Pulse 68 08/29/19 0210  Resp 26 08/29/19 0210  SpO2 100 % 08/29/19 0210  Vitals shown include unvalidated device data.  Last Pain:  Vitals:   08/29/2019 1645  TempSrc:   PainSc: 5          Complications: No complications documented.

## 2019-08-29 NOTE — Anesthesia Procedure Notes (Signed)
Arterial Line Insertion Start/End6/15/2021 12:25 AM, 08/29/2019 12:31 AM Performed by: Jearld Pies, CRNA, CRNA  Patient location: OR. Emergency situation Patient sedated Left, radial was placed Catheter size: 20 G Hand hygiene performed  and Seldinger technique used Allen's test indicative of satisfactory collateral circulation Attempts: 1 Procedure performed using ultrasound guided technique. Ultrasound Notes:anatomy identified, needle tip was noted to be adjacent to the nerve/plexus identified and no ultrasound evidence of intravascular and/or intraneural injection Following insertion, dressing applied and Biopatch. Post procedure assessment: normal

## 2019-08-29 NOTE — Op Note (Signed)
Preoperative diagnosis: pneumoperitoneum  Postoperative diagnosis: perforated right colon, feculent peritonitis, purulent peritonitis, septic shock  Procedure: right hemicolectomy with end ileostomy  Surgeon: Gurney Maxin, M.D.  Asst: Alphonsa Overall, M.D.  Anesthesia: general  Indications for procedure: Alan Orr is a 74 y.o. year old male with symptoms of abdominal pain, bloating and vomiting. Work up showed pneumoperitoneum so he was transferred to [facility] and taken to the operating room for exploration.  Description of procedure: The patient was brought into the operative suite. Anesthesia was administered with General endotracheal anesthesia. WHO checklist was applied. The patient was then placed in supine position. The area was prepped and draped in the usual sterile fashion.  Next, a midline incision was made. Cautery was used to dissect through the subcutaneous tissue and the fascia was entered in the midline. There was release of odorous gas upon entering the peritoneal cavity. The intestine were purple in appearance concerning for ischemia or low flow state. There was turbid fluid through the lower abdomen and feculent peritonitis. The small intestine was inspected and no perforation was seen. The sigmoid colon and left colon was inspected and no perforation was seen. The right colon was inspected and showed a 1 cm perforation along the mid anterior surface.  The right colon was mobilized by dividing the white line of Toldt. The hepatic ligament was divided with cautery. An area of healthy colon was seen in the proximal transverse colon and a 75 mm blue load GIA was used to divide the colon in this position. The terminal ileum was inspected and the ileum was divided about 8 cm from the ileocecal valve. The mesocolon was divided with ligasure device. The abdomen was then irrigated with 6 l of warm saline to remove contamination.   A circular incision was made in the upper right  abdomen, the right rectus sheath was divided and the end of the ileum was brought through the hole.  The fascia was closed with 0 PDS in running fashion. The ileostomy was matured with Brooking. After maturing, digital palpation was performed which led to an injury in the intestine, so the ileostomy was taken down and divided at the level of injury and rematured with 3-4 cm of Brooking. The midline incision was left open and packed with saline moistened gauze. The patient awoke from anesthesia and was brought to PACU in stable condition.  Findings: perforation of right colon with feculent peritonitis and purulent peritonitis  Specimen: right colon  Implant: saline gauze   Blood loss: 40 ml  Local anesthesia: none  Complications: none  Gurney Maxin, M.D. General, Bariatric, & Minimally Invasive Surgery [facility]

## 2019-08-29 NOTE — Progress Notes (Signed)
Patient had a critical lab values of: Calcium 6.3, and Lactic Acid of 2.1. [facility] notified

## 2019-08-29 NOTE — Anesthesia Procedure Notes (Signed)
Central Venous Catheter Insertion Performed by: Oleta Mouse, MD, anesthesiologist Start/End06/13/2021 11:36 PM, 08/28/2019 11:46 PM Patient location: OR. Preanesthetic checklist: patient identified, IV checked, site marked, risks and benefits discussed, surgical consent, monitors and equipment checked, pre-op evaluation, timeout performed and anesthesia consent Position: supine Patient sedated Hand hygiene performed  and maximum sterile barriers used  Catheter size: 8 Fr Total catheter length 16. Central line was placed.Double lumen Procedure performed using ultrasound guided technique. Ultrasound Notes:image(s) printed for medical record Attempts: 1 Following insertion, dressing applied, line sutured and Biopatch. Post procedure assessment: blood return through all ports, free fluid flow and no air  Patient tolerated the procedure well with no immediate complications.

## 2019-08-29 NOTE — Anesthesia Procedure Notes (Signed)
Procedure Name: Intubation Date/Time: 08/29/2019 12:14 AM Performed by: Jearld Pies, CRNA Pre-anesthesia Checklist: Patient identified, Emergency Drugs available, Suction available and Patient being monitored Patient Re-evaluated:Patient Re-evaluated prior to induction Oxygen Delivery Method: Circle System Utilized Preoxygenation: Pre-oxygenation with 100% oxygen Induction Type: IV induction, Rapid sequence and Cricoid Pressure applied Laryngoscope Size: Miller and 2 Grade View: Grade I Tube type: Oral Tube size: 7.5 mm Number of attempts: 1 Airway Equipment and Method: Stylet Placement Confirmation: ETT inserted through vocal cords under direct vision,  positive ETCO2 and breath sounds checked- equal and bilateral Secured at: 22 cm Tube secured with: Tape Dental Injury: Teeth and Oropharynx as per pre-operative assessment

## 2019-08-29 NOTE — Progress Notes (Addendum)
PROGRESS NOTE    Alan Orr  ONG:[id number] DOB: 1945-12-29 DOA: 09/10/2019 PCP: [name], MD  Brief Narrative: Alan Orr is a 74 y.o. male with history of CAD/CABG, paroxysmal atrial fibrillation on Pradaxa, history of VT status post AICD, hypertension, type 2 diabetes mellitus, stage III chronic kidney disease was transferred to [facility] from [facility] with acute abdomen, bowel perforation on 6/14 -Of note, the patient was recently hospitalized at [facility] from 08/18/2019 to 08/22/2019 following multiple falls, noted to have a right humeral fracture which was managed conservatively, he was subsequently discharged to rehab on 6/8 with recommendation to follow-up with Dr. Aline Brochure in 1 week. -He presented from SNF to ED 6/14 with acute abdominal pain and distention for 24 hours -In the ED he was noted to be hypotensive, labs noted acute kidney injury- -CT abdomen/pelvis - extensive free air in the abdomen consistent with bowel perforation,  -Transferred to [facility], taken to the OR emergently by Dr. Kieth Brightly early this morning, noted to have perforated right colon with feculent peritonitis, septic shock, underwent right hemicolectomy with end ileostomy   Assessment & Plan:   Septic shock Colon perforation, Feculent peritonitis -Clinically improving, but BP remains soft -Continue IV Zosyn, underwent right hemicolectomy and end ileostomy early this morning -N.p.o., NG tube decompression, IV fluids -another bolus now, monitor urine output -if worsens, will consult PCCM for pressors  Acute kidney injury -Baseline creatinine 1.3, creatinine on admission yesterday was 2.5 -In the setting of severe sepsis, shock, continue IV fluids -Foley catheter, monitor urine output  Paroxysmal atrial fibrillation -Pradaxa on hold, he is on metoprolol 50 mg twice daily at baseline, resume this IV in 1 to 2 days, blood pressure still on the softer side  History of ventricular  tachycardia -Status post AICD -Monitor electrolytes closely -Resume p.o. metoprolol and amiodarone when possible  Essential hypertension -Antihypertensives on hold, monitor  Type 2 diabetes mellitus -Recent hemoglobin A1c was 6.0, CBGs are stable, continue sliding scale insulin  Recent humerus fracture -Continue sling -Follow-up with orthopedics Dr. Aline Brochure in [place]  Hypokalemia -Replace IV  DVT prophylaxis: SCDs, hopefully start Lovenox or subcu heparin tomorrow Code Status: Full code Family Communication: Discussed with patient, no family at bedside Disposition Plan:  Status is: Inpatient  Remains inpatient appropriate because:Inpatient level of care appropriate due to severity of illness   Dispo: The patient is from: SNF              Anticipated d/c is to: SNF              Anticipated d/c date is: > 3 days              Patient currently is not medically stable to d/c.  Consultants:  General surgery  Procedures: 6/15 Dr. Kieth Brightly right hemicolectomy and end ileostomy  Antimicrobials:    Subjective: -Continues to have mild abdominal pain, overall feels better from yesterday  Objective: Vitals:   08/29/19 0345 08/29/19 0355 08/29/19 0500 08/29/19 0808  BP:    (!) 101/36  Pulse: 69   71  Resp: 19   13  Temp:    98 F (36.7 C)  TempSrc:    Oral  SpO2: 97%   92%  Weight:  97 kg 97 kg   Height:        Intake/Output Summary (Last 24 hours) at 08/29/2019 1139 Last data filed at 08/29/2019 0600 Gross per 24 hour  Intake 2425 ml  Output 475 ml  Net 1950 ml   Filed Weights   08/19/2019 1528 08/29/19 0355 08/29/19 0500  Weight: 96 kg 97 kg 97 kg    Examination:  General exam: Elderly chronically ill male laying in bed, awake alert, hard of hearing, oriented to self and partly to place HEENT: NG tube noted CVS: S1-S2, regular rhythm, systolic murmur Lungs with decreased breath sounds the bases, otherwise clear Abdomen is soft, mildly distended,  tender as expected, ileostomy noted Extremities: No edema, right arm in sling Skin: No rashes on exposed skin Psychiatry: Mood & affect appropriate.     Data Reviewed:   CBC: Recent Labs  Lab 09/08/2019 1642 08/29/19 0044 08/29/19 0549  WBC 11.8*  --  8.1  NEUTROABS 10.7*  --  7.2  HGB 12.8* 10.2* 8.5*  HCT 38.3* 30.0* 25.9*  MCV 107.0*  --  106.6*  PLT 352  --  267   Basic Metabolic Panel: Recent Labs  Lab 08/17/2019 1740 08/29/19 0044 08/29/19 0549  NA 138 143 142  K 2.8* 3.3* 3.0*  CL 103  --  113*  CO2 21*  --  20*  GLUCOSE 215*  --  174*  BUN 33*  --  29*  CREATININE 2.52*  --  2.48*  CALCIUM 7.8*  --  6.3*  MG  --   --  1.7   GFR: Estimated Creatinine Clearance: 30.4 mL/min (A) (by C-G formula based on SCr of 2.48 mg/dL (H)). Liver Function Tests: Recent Labs  Lab 08/23/2019 1740 08/29/19 0549  AST 116* 245*  ALT 60* 91*  ALKPHOS 96 60  BILITOT 1.1 1.2  PROT 5.7* 4.0*  ALBUMIN 2.0* 1.9*   Recent Labs  Lab 08/20/2019 1740  LIPASE 23   No results for input(s): AMMONIA in the last 168 hours. Coagulation Profile: Recent Labs  Lab 08/21/2019 1740  INR 1.8*   Cardiac Enzymes: No results for input(s): CKTOTAL, CKMB, CKMBINDEX, TROPONINI in the last 168 hours. BNP (last 3 results) No results for input(s): PROBNP in the last 8760 hours. HbA1C: No results for input(s): HGBA1C in the last 72 hours. CBG: Recent Labs  Lab 08/22/19 1629 08/29/19 0224 08/29/19 0601  GLUCAP 102* 175* 195*   Lipid Profile: No results for input(s): CHOL, HDL, LDLCALC, TRIG, CHOLHDL, LDLDIRECT in the last 72 hours. Thyroid Function Tests: No results for input(s): TSH, T4TOTAL, FREET4, T3FREE, THYROIDAB in the last 72 hours. Anemia Panel: No results for input(s): VITAMINB12, FOLATE, FERRITIN, TIBC, IRON, RETICCTPCT in the last 72 hours. Urine analysis:    Component Value Date/Time   COLORURINE YELLOW 08/18/2019 1138   APPEARANCEUR CLEAR 08/18/2019 1138   LABSPEC 1.020  08/18/2019 1138   PHURINE 5.0 08/18/2019 1138   GLUCOSEU NEGATIVE 08/18/2019 1138   HGBUR LARGE (A) 08/18/2019 1138   BILIRUBINUR NEGATIVE 08/18/2019 1138   KETONESUR 5 (A) 08/18/2019 1138   PROTEINUR NEGATIVE 08/18/2019 1138   UROBILINOGEN 0.2 01/05/2015 1245   NITRITE NEGATIVE 08/18/2019 1138   LEUKOCYTESUR NEGATIVE 08/18/2019 1138   Sepsis Labs: @LABRCNTIP (procalcitonin:4,lacticidven:4)  ) Recent Results (from the past 240 hour(s))  SARS Coronavirus 2 by RT PCR (hospital order, performed in [place] hospital lab) Nasopharyngeal Nasopharyngeal Swab     Status: None   Collection Time: 08/16/2019  7:00 PM   Specimen: Nasopharyngeal Swab  Result Value Ref Range Status   SARS Coronavirus 2 NEGATIVE NEGATIVE Final    Comment: (NOTE) SARS-CoV-2 target nucleic acids are NOT DETECTED.  The SARS-CoV-2 RNA is generally detectable in upper and lower  respiratory specimens during the acute phase of infection. The lowest concentration of SARS-CoV-2 viral copies this assay can detect is 250 copies / mL. A negative result does not preclude SARS-CoV-2 infection and should not be used as the sole basis for treatment or other patient management decisions.  A negative result may occur with improper specimen collection / handling, submission of specimen other than nasopharyngeal swab, presence of viral mutation(s) within the areas targeted by this assay, and inadequate number of viral copies (<250 copies / mL). A negative result must be combined with clinical observations, patient history, and epidemiological information.  Fact Sheet for Patients:   StrictlyIdeas.no  Fact Sheet for Healthcare Providers: BankingDealers.co.za  This test is not yet approved or  cleared by the [place] FDA and has been authorized for detection and/or diagnosis of SARS-CoV-2 by FDA under an Emergency Use Authorization (EUA).  This EUA will remain in effect  (meaning this test can be used) for the duration of the COVID-19 declaration under Section 564(b)(1) of the Act, 21 U.S.C. section 360bbb-3(b)(1), unless the authorization is terminated or revoked sooner.  Performed at [facility], [address], [place], [place] [address]   Culture, blood (Routine X 2) w Reflex to ID Panel     Status: None (Preliminary result)   Collection Time: 08/29/19  5:41 AM   Specimen: BLOOD LEFT HAND  Result Value Ref Range Status   Specimen Description BLOOD LEFT HAND  Final   Special Requests   Final    BOTTLES DRAWN AEROBIC ONLY Blood Culture adequate volume   Culture   Final    NO GROWTH < 12 HOURS Performed at [facility], [place] [address], [place], [place] [address]    Report Status PENDING  Incomplete  Culture, blood (Routine X 2) w Reflex to ID Panel     Status: None (Preliminary result)   Collection Time: 08/29/19  5:49 AM   Specimen: BLOOD  Result Value Ref Range Status   Specimen Description BLOOD SITE NOT SPECIFIED  Final   Special Requests   Final    BOTTLES DRAWN AEROBIC ONLY Blood Culture results may not be optimal due to an inadequate volume of blood received in culture bottles   Culture   Final    NO GROWTH < 12 HOURS Performed at [facility], [place] [address] [place], [place] [address]    Report Status PENDING  Incomplete         Radiology Studies: CT ABDOMEN PELVIS WO CONTRAST  Result Date: 09/11/2019 CLINICAL DATA:  Diffuse abdominal pain. EXAM: CT ABDOMEN AND PELVIS WITHOUT CONTRAST TECHNIQUE: Multidetector CT imaging of the abdomen and pelvis was performed following the standard protocol without IV contrast. COMPARISON:  CT scan dated 02/16/2007 FINDINGS: Lower chest: There are tiny bilateral pleural effusions. Aortic atherosclerosis. Coronary artery calcifications. CABG. Defibrillator in place. Hepatobiliary: Liver parenchyma is normal. Gallbladder is distended. No bile duct dilatation. Pancreas: Unremarkable. No  pancreatic ductal dilatation or surrounding inflammatory changes. Spleen: Spleen is atrophic. Adrenals/Urinary Tract: Normal adrenal glands and kidneys. No hydronephrosis. Bladder is empty. Stomach/Bowel: There is extensive free air in the abdomen. There is also small amount ascites in the abdomen. There are multiple gas bubbles in the ascites around the normal with the right lobe of the liver as well as in the mesentery and most prominently around the patent flexure of the colon. This may be the site of perforation. No diverticular disease. There is mucosal thickening of the ascending and  hepatic flexure portions of the colon stomach and small bowel appear normal. Appendix is normal. Vascular/Lymphatic: Extensive aortic atherosclerosis. No adenopathy. Reproductive: Prostate is unremarkable. Other: Small ascites. Extensive free air in the abdomen. Slight edema in the subcutaneous fat of the flanks and lateral aspects of the hips. Musculoskeletal: No acute abnormality. Extensive surgical lumbar fusion. IMPRESSION: 1. Extensive free air in the abdomen consistent with perforation of the bowel. The most prominent air in the peritoneal fat is adjacent to the hepatic flexure of the colon and there is edema of the mucosa of the ascending and hepatic flexure portions of the colon suggesting colitis. 2. Tiny bilateral pleural effusions. 3.  Aortic Atherosclerosis (ICD10-I70.0). Critical Value/emergent results were called by telephone at the time of interpretation on 09/07/2019 at 6:55 pm to provider Ahlaya Ende ZAMMIT , who verbally acknowledged these results. Electronically Signed   By: Lorriane Shire M.D.   On: 08/17/2019 19:03   DG Chest Port 1 View  Result Date: 08/29/2019 CLINICAL DATA:  NG tube placement EXAM: PORTABLE CHEST 1 VIEW COMPARISON:  12/25/2017 FINDINGS: NG tube is in place with the tip in the stomach. A ICD within the heart, unchanged. Prior CABG. IMPRESSION: NG tube tip in the stomach. Electronically Signed    By: Rolm Baptise M.D.   On: 08/29/2019 02:38        Scheduled Meds: . Chlorhexidine Gluconate Cloth  6 each Topical Daily  . insulin aspart  0-6 Units Subcutaneous Q6H  . sodium chloride flush  3 mL Intravenous Q12H   Continuous Infusions: . 0.9 % NaCl with KCl 40 mEq / L 75 mL/hr at 08/29/19 1128  . acetaminophen    . piperacillin-tazobactam (ZOSYN)  IV    . potassium chloride 10 mEq (08/29/19 1123)     LOS: 1 day    Time spent: 16min  Domenic Polite, MD [facility] 08/29/2019, 11:39 AM

## 2019-08-29 NOTE — Consult Note (Addendum)
NAME:  Alan Orr, MRN:  [id number], DOB:  04-07-1945, LOS: 1 ADMISSION DATE:  08/29/2019, CONSULTATION DATE: 6/15 REFERRING MD: Dr. Broadus John, CHIEF COMPLAINT: Shock  Brief History   74 year old male admitted with acute abdomen 6/14 taken emergently to the OR for right hemicolectomy with end ileostomy.  Postoperatively he was hypotensive despite IV fluid resuscitation and was transferred to ICU for further management.  History of present illness   74 year old male with past medical history as below, which is significant for coronary artery disease status post CABG, atrial fibrillation on Pradaxa, VT status post AICD, CKD 3. He lives alone and is fairly independent per family (still changes his own motor oil). Recently admitted to [facility] after fall for R humerus fracture. There was concern for polypharmacy and for inability to self care so he was discharged to SNF. He presented to [facility] emergency department on 6/14 with complaints of abdominal distention and tenderness x24 hours.  CT imaging of the abdomen demonstrated bowel perforation general surgery at any [facility] recommended transfer to [facility].  Upon arrival to [facility] he was taken emergently to the operating room by Dr. Kieth Brightly where he underwent right hemicolectomy with end ileostomy.  Fascia was closed in the OR soft tissue remained open and surgically packed and dressed.  The postoperative.  He developed progressive hypotension despite almost 5 L of IV fluids since the time of admission.  PCCM was consulted for possible pressor need.  Past Medical History   has a past medical history of CAD (coronary artery disease), DM (diabetes mellitus) ([place]), DVT (deep venous thrombosis) ([place]), Hyperlipidemia, Hypertension, ICD (implantable cardioverter-defibrillator), single, in situ, Ischemic cardiomyopathy, PAF (paroxysmal atrial fibrillation) ([place]), RBBB, and S/P CABG x 5.   Significant Hospital Events   6/14: Admit, 2 OR  for hemicolectomy  Consults:  General surgery  Procedures:  6/14: Right IJ double-lumen central line  Significant Diagnostic Tests:  CT abdomen, pelvis 6/14: Extensive free air in the abdomen consistent with bowel perforation.  Tiny bilateral pleural effusions.  Micro Data:  Blood 6/14 >   Antimicrobials:  Zosyn 6/14>   Interim history/subjective:  Patient has no complaints at this time  Objective   Blood pressure (!) 92/28, pulse 73, temperature 97.6 F (36.4 C), temperature source Axillary, resp. rate 18, height 6\' 2"  (1.88 m), weight 97 kg, SpO2 90 %.        Intake/Output Summary (Last 24 hours) at 08/29/2019 1431 Last data filed at 08/29/2019 1423 Gross per 24 hour  Intake 4275.04 ml  Output 475 ml  Net 3800.04 ml   Filed Weights   09/02/2019 1528 08/29/19 0355 08/29/19 0500  Weight: 96 kg 97 kg 97 kg    Examination: General: Acutely ill-appearing elderly male HENT: Normocephalic, atraumatic, PERRL, no JVD Lungs: Clear bilateral breath sounds Cardiovascular: Regular rate and rhythm, no murmurs rubs gallops Abdomen: Longitudinal midline surgical dressing clean dry and intact.  Ileostomy beefy red. Extremities: No acute deformity.  Right greater than left lower extremity pitting edema to the ankles Neuro: Awake, alert, oriented x2  Resolved Hospital Problem list     Assessment & Plan:   Septic shock secondary to peritonitis in the setting of bowel perforation: Status post hemicolectomy and end ileostomy on 6/14. -Transfer to ICU for vasopressors.  MAP goal greater than 65 mmHg -Continue gentle IV infusion -Give albumin -Continue empiric Zosyn -Follow cultures -Lactic acid  Bowel perforation: As above -Management per general surgery -N.p.o. -DC morphine for pain  management and will address should need arise. He has not required any on the current nursing shift.   Acute kidney injury CKD 3 baseline creatinine 1.3 Hypokalemia Hypocalcemia -Calcium and  potassium supplementation ongoing -Repeat BMP  Atrial fibrillation: On Pradaxa Ventricular tachycardia, history: Status post AICD -Continue to hold Pradaxa -Hopefully can start VT prophylaxis tomorrow -Holding home metoprolol -We will defer to general surgery  DM2 -CBG monitoring and SSI  Humerus fracture -Status post fall 10 days prior to arrival - Maintain sling.  -Plans to follow-up outpatient with Dr. Aline Brochure in [place] -If inpatient course is prolonged consider orthopedic consult inpatient  Best practice:  Diet: N.p.o. Pain/Anxiety/Delirium protocol (if indicated):  VAP protocol (if indicated):  DVT prophylaxis: SCDs GI prophylaxis: Glucose control: CBG monitoring and SSI Mobility: Bedrest Code Status: Full code Family Communication: Nephew updated regarding ICU transfer. Nephew states he is healthcare POA. Patient has a wife, but they are separated for some time.  Disposition: ICU  Labs   CBC: Recent Labs  Lab 08/30/2019 1642 08/29/19 0044 08/29/19 0549  WBC 11.8*  --  8.1  NEUTROABS 10.7*  --  7.2  HGB 12.8* 10.2* 8.5*  HCT 38.3* 30.0* 25.9*  MCV 107.0*  --  106.6*  PLT 352  --  277    Basic Metabolic Panel: Recent Labs  Lab 09/04/2019 1740 08/29/19 0044 08/29/19 0549  NA 138 143 142  K 2.8* 3.3* 3.0*  CL 103  --  113*  CO2 21*  --  20*  GLUCOSE 215*  --  174*  BUN 33*  --  29*  CREATININE 2.52*  --  2.48*  CALCIUM 7.8*  --  6.3*  MG  --   --  1.7   GFR: Estimated Creatinine Clearance: 30.4 mL/min (A) (by C-G formula based on SCr of 2.48 mg/dL (H)). Recent Labs  Lab 09/10/2019 1642 08/29/19 0549  WBC 11.8* 8.1  LATICACIDVEN  --  2.1*    Liver Function Tests: Recent Labs  Lab 09/06/2019 1740 08/29/19 0549  AST 116* 245*  ALT 60* 91*  ALKPHOS 96 60  BILITOT 1.1 1.2  PROT 5.7* 4.0*  ALBUMIN 2.0* 1.9*   Recent Labs  Lab 08/17/2019 1740  LIPASE 23   No results for input(s): AMMONIA in the last 168 hours.  ABG    Component Value  Date/Time   PHART 7.368 08/29/2019 0044   PCO2ART 41.9 08/29/2019 0044   PO2ART 502 (H) 08/29/2019 0044   HCO3 25.1 08/29/2019 0044   TCO2 27 08/29/2019 0044   ACIDBASEDEF 1.0 08/29/2019 0044   O2SAT 100.0 08/29/2019 0044     Coagulation Profile: Recent Labs  Lab 09/04/2019 1740  INR 1.8*    Cardiac Enzymes: No results for input(s): CKTOTAL, CKMB, CKMBINDEX, TROPONINI in the last 168 hours.  HbA1C: Hemoglobin-A1c  Date/Time Value Ref Range Status  12/09/2006 01:50 PM (H) <6.0 % Final   8.6 (NOTE)  Therapeutic target for the treatment of diabetes mellitus patients is <7% HbA1c.  American Diabetes Association Diabetes Care 2002;25:S33-S49.   Hgb A1c MFr Bld  Date/Time Value Ref Range Status  08/18/2019 11:54 AM 6.0 (H) 4.8 - 5.6 % Final    Comment:    (NOTE) Pre diabetes:          5.7%-6.4% Diabetes:              >6.4% Glycemic control for   <7.0% adults with diabetes   06/21/2018 01:38 PM 5.9 (H) 4.8 - 5.6 % Final  Comment:    (NOTE) Pre diabetes:          5.7%-6.4% Diabetes:              >6.4% Glycemic control for   <7.0% adults with diabetes     CBG: Recent Labs  Lab 08/22/19 1629 08/29/19 0224 08/29/19 0601 08/29/19 1201  GLUCAP 102* 175* 195* 151*    Review of Systems:   Patient is encephalopathic and/or intubated. Therefore history has been obtained from chart review.    Past Medical History  He,  has a past medical history of CAD (coronary artery disease), DM (diabetes mellitus) ([place]), DVT (deep venous thrombosis) ([place]), Hyperlipidemia, Hypertension, ICD (implantable cardioverter-defibrillator), single, in situ, Ischemic cardiomyopathy, PAF (paroxysmal atrial fibrillation) ([place]), RBBB, and S/P CABG x 5.   Surgical History    Past Surgical History:  Procedure Laterality Date  . BACK SURGERY    . CARDIAC CATHETERIZATION  05/08/2009   small vessel disease  . CARDIAC DEFIBRILLATOR PLACEMENT  05/09/2009   [employer]  . CORONARY ARTERY BYPASS GRAFT   12/31/1995   LIMA to LAD,SVG to intermediate,SVG to CX,seq. svg to posterior descending and posterolateral RCA  . EP IMPLANTABLE DEVICE N/A 09/25/2015   Procedure: PPM/BIV PPM Generator Changeout;  Surgeon: Evans Lance, MD;  Location: [place] [facility];  Service: Cardiovascular;  Laterality: N/A;  . I & D EXTREMITY Left 11/03/2013   Procedure: Left Leg Debride Ulcer, Apply Wound VAC and [place];  Surgeon: Newt Minion, MD;  Location: [place] [facility];  Service: Orthopedics;  Laterality: Left;  . ILEOSTOMY Right 09/07/2019   Procedure: ILEOSTOMY;  Surgeon: Kinsinger, Arta Bruce, MD;  Location: [place];  Service: General;  Laterality: Right;  . LAPAROTOMY N/A 08/21/2019   Procedure: EXPLORATORY LAPAROTOMY,;  Surgeon: Kieth Brightly Arta Bruce, MD;  Location: [place];  Service: General;  Laterality: N/A;  . PARTIAL COLECTOMY Right 09/08/2019   Procedure: RIGHT COLECTOMY;  Surgeon: Kieth Brightly Arta Bruce, MD;  Location: [place];  Service: General;  Laterality: Right;  . TEE WITHOUT CARDIOVERSION N/A 01/11/2015   Procedure: TRANSESOPHAGEAL ECHOCARDIOGRAM (TEE);  Surgeon: Herminio Commons, MD;  [facility];  Service: Cardiology;  Laterality: N/A;     Social History   reports that he quit smoking about 23 years ago. His smoking use included cigarettes. He has a 52.50 pack-year smoking history. He has never used smokeless tobacco. He reports current alcohol use. He reports that he does not use drugs.   Family History   His family history includes Heart attack in his mother; Stroke in his father.   Allergies No Known Allergies   Home Medications  Prior to Admission medications   Medication Sig Start Date End Date Taking? Authorizing Provider  acetaminophen (TYLENOL) 325 MG tablet Take 650 mg by mouth every 6 (six) hours as needed for mild pain or moderate pain.   Yes [provider]  ALPRAZolam Duanne Moron) 1 MG tablet Take 1 tablet (1 mg total) by mouth 2 (two) times daily as needed for anxiety  or sleep. 08/22/19  Yes Shah, Pratik D, DO  amiodarone (PACERONE) 200 MG tablet Take 200 mg by mouth daily.   Yes [provider]  aspirin EC 81 MG tablet Take 81 mg by mouth at bedtime.   Yes [provider]  atorvastatin (LIPITOR) 40 MG tablet Take 40 mg by mouth at bedtime.   Yes [provider]  dabigatran (PRADAXA) 75 MG CAPS capsule Take 1 capsule (75 mg total) by  mouth every 12 (twelve) hours. 08/16/19  Yes Danford, Suann Larry, MD  ezetimibe (ZETIA) 10 MG tablet TAKE 1 TABLET ONCE DAILY FOR CHOLESTEROL. Patient taking differently: Take 10 mg by mouth daily.  01/04/17  Yes Lorretta Harp, MD  ferrous sulfate 325 (65 FE) MG tablet Take 325 mg by mouth daily with breakfast.   Yes [provider]  loperamide (IMODIUM) 2 MG capsule Take 1 capsule (2 mg total) by mouth as needed for diarrhea or loose stools. 08/22/19  Yes Shah, Pratik D, DO  magnesium oxide (MAG-OX) 400 MG tablet Take 400 mg by mouth daily.   Yes [provider]  metoprolol tartrate (LOPRESSOR) 50 MG tablet TAKE 1 TABLET BY MOUTH TWICE DAILY. Patient taking differently: Take 50 mg by mouth 2 (two) times daily.  10/10/18  Yes Evans Lance, MD  Multiple Vitamin (MULTI-VITAMINS) TABS Take 1 tablet by mouth daily.   Yes [provider]  niacin (NIASPAN) 1000 MG CR tablet TAKE (1) TABLET BY MOUTH AT BEDTIME. Patient taking differently: Take 1,000 mg by mouth at bedtime.  10/15/16  Yes Bayard Hugger, MD  nitroGLYCERIN (NITROSTAT) 0.4 MG SL tablet Place 0.4 mg under the tongue every 5 (five) minutes x 3 doses as needed for chest pain. 10/31/15  Yes [provider]  oxyCODONE 10 MG TABS Take 1 tablet (10 mg total) by mouth every 6 (six) hours as needed for severe pain or breakthrough pain (for pain). 08/22/19  Yes Shah, Pratik D, DO  ramipril (ALTACE) 2.5 MG capsule Take 2.5 mg by mouth daily.   Yes [provider]  SYNTHROID 137 MCG tablet Take 137 mcg by mouth daily  before breakfast.  07/20/16  Yes [provider]     Critical care time: 45 mins     Georgann Housekeeper, AGACNP-BC [place] for personal pager PCCM on call pager [phone]  08/29/2019 3:01 PM    Pulmonary critical care attending:  This is a 74 year old gentleman past medical history of coronary artery disease status post CABG, atrial fibrillation on Pradaxa, VT status post AICD and chronic kidney disease stage III.  Patient was initially admitted to the [facility] emergency room with complaints of abdominal pain.  It was transferred here to [facility].  Ultimately found to have perforated colon taken for right hemicolectomy and end ileostomy.  He had feculent peritonitis and was transferred to the floor with end ileostomy tube and closed abdomen.  Patient was found on the floor to have progressive shocklike state with concern of peritonitis and transferred to the intensive care unit.  Also had worsening electrolyte abnormalities including AKI.  Pulmonary critical care was consulted for recommendations of management and transferred to the intensive care unit.  BP (!) 108/44   Pulse 71   Temp (!) 96.6 F (35.9 C) (Axillary)   Resp 18   Ht 6\' 2"  (1.88 m)   Wt 97 kg   SpO2 97%   BMI 27.46 kg/m   General: Elderly male resting comfortably in bed HEENT: NCAT sclera clear tracking appropriately Lungs: Clear to auscultation bilaterally Heart: Regular rhythm S1-S2 Abdomen: Ostomy in place, midline incision dressing in place  Labs: Reviewed sodium 140, potassium 4.2, serum creatinine 2.94 this is slowly been increasing.  Assessment: Septic shock, concern for peritonitis as source Bowel perforation corrected by surgery, hemicolectomy with end ileostomy AKI Baseline CKD stage III Atrial fibrillation on Pradaxa History of VT status post AICD Type 2 diabetes  Fall with humeral fracture  Plan: IV fluids Albumin Zosyn Follow  cultures Norepinephrine to maintain MAP greater than 65 mmHg Holding morphine in the setting of renal failure  This patient is critically ill with multiple organ system failure; which, requires frequent high complexity decision making, assessment, support, evaluation, and titration of therapies. This was completed through the application of advanced monitoring technologies and extensive interpretation of multiple databases. During this encounter critical care time was devoted to patient care services described in this note for 32 minutes.  Garner Nash, DO [facility] 08/29/2019 5:45 PM

## 2019-08-29 NOTE — Consult Note (Signed)
[place] Nurse ostomy follow up Patient receiving care in [facility] 4N05. Stoma type/location: RUQ ileostomy Stomal assessment/size: 1 1/8 inches, round, budded, red, moist Peristomal assessment: deferred Treatment options for stomal/peristomal skin: barrier ring if needed Output: none at this time  Ostomy pouching: 2pc. Flat cut to fit Education provided:  none Enrolled patient in [place] Start Discharge program: No The following supplies were to be ordered by the [place]:  pouch, Kellie Simmering [phone]; skin barrier, Kellie Simmering #2; barrier rings, Kellie Simmering [phone]. Will see patient 6/16. Val Riles, RN, MSN, CWOCN, CNS-BC, pager [phone]

## 2019-08-29 NOTE — Progress Notes (Signed)
Pharmacy Antibiotic Note  Alan Orr is a 74 y.o. male admitted on 09/06/2019 with perforation of right colon with feculent peritonitis and purulent peritonitis.  Pharmacy has been consulted for Zosyn dosing.  Plan: Zosyn 3.375g IV Q8H (4-hour infusion).  Height: 6\' 2"  (188 cm) Weight: 96 kg (211 lb 10.3 oz) IBW/kg (Calculated) : 82.2  Temp (24hrs), Avg:97.7 F (36.5 C), Min:97.2 F (36.2 C), Max:98.7 F (37.1 C)  Recent Labs  Lab 08/22/19 0459 08/29/2019 1642 09/01/2019 1740  WBC 8.1 11.8*  --   CREATININE 1.28*  --  2.52*    Estimated Creatinine Clearance: 29.9 mL/min (A) (by C-G formula based on SCr of 2.52 mg/dL (H)).    No Known Allergies   Thank you for allowing pharmacy to be a part of this patients care.  Wynona Neat, PharmD, BCPS  08/29/2019 4:01 AM

## 2019-08-30 ENCOUNTER — Ambulatory Visit: Payer: Medicare Other | Admitting: Orthopedic Surgery

## 2019-08-30 ENCOUNTER — Inpatient Hospital Stay (HOSPITAL_COMMUNITY): Payer: Medicare Other

## 2019-08-30 DIAGNOSIS — I480 Paroxysmal atrial fibrillation: Secondary | ICD-10-CM

## 2019-08-30 DIAGNOSIS — I255 Ischemic cardiomyopathy: Secondary | ICD-10-CM

## 2019-08-30 LAB — DIFFERENTIAL
Abs Immature Granulocytes: 0 10*3/uL (ref 0.00–0.07)
Basophils Absolute: 0 10*3/uL (ref 0.0–0.1)
Basophils Relative: 0 %
Eosinophils Absolute: 0 10*3/uL (ref 0.0–0.5)
Eosinophils Relative: 0 %
Lymphocytes Relative: 4 %
Lymphs Abs: 0.6 10*3/uL — ABNORMAL LOW (ref 0.7–4.0)
Monocytes Absolute: 0.4 10*3/uL (ref 0.1–1.0)
Monocytes Relative: 3 %
Neutro Abs: 13.6 10*3/uL — ABNORMAL HIGH (ref 1.7–7.7)
Neutrophils Relative %: 93 %
nRBC: 1 /100 WBC — ABNORMAL HIGH

## 2019-08-30 LAB — CBC
HCT: 28.9 % — ABNORMAL LOW (ref 39.0–52.0)
Hemoglobin: 9.4 g/dL — ABNORMAL LOW (ref 13.0–17.0)
MCH: 35.2 pg — ABNORMAL HIGH (ref 26.0–34.0)
MCHC: 32.5 g/dL (ref 30.0–36.0)
MCV: 108.2 fL — ABNORMAL HIGH (ref 80.0–100.0)
Platelets: 403 10*3/uL — ABNORMAL HIGH (ref 150–400)
RBC: 2.67 MIL/uL — ABNORMAL LOW (ref 4.22–5.81)
RDW: 16.5 % — ABNORMAL HIGH (ref 11.5–15.5)
WBC: 14.6 10*3/uL — ABNORMAL HIGH (ref 4.0–10.5)
nRBC: 0.3 % — ABNORMAL HIGH (ref 0.0–0.2)

## 2019-08-30 LAB — TROPONIN I (HIGH SENSITIVITY): Troponin I (High Sensitivity): 67 ng/L — ABNORMAL HIGH (ref <18)

## 2019-08-30 LAB — COMPREHENSIVE METABOLIC PANEL
ALT: 115 U/L — ABNORMAL HIGH (ref 0–44)
AST: 220 U/L — ABNORMAL HIGH (ref 15–41)
Albumin: 1.8 g/dL — ABNORMAL LOW (ref 3.5–5.0)
Alkaline Phosphatase: 73 U/L (ref 38–126)
Anion gap: 9 (ref 5–15)
BUN: 48 mg/dL — ABNORMAL HIGH (ref 8–23)
CO2: 22 mmol/L (ref 22–32)
Calcium: 7.4 mg/dL — ABNORMAL LOW (ref 8.9–10.3)
Chloride: 110 mmol/L (ref 98–111)
Creatinine, Ser: 3.43 mg/dL — ABNORMAL HIGH (ref 0.61–1.24)
GFR calc Af Amer: 19 mL/min — ABNORMAL LOW (ref >60)
GFR calc non Af Amer: 17 mL/min — ABNORMAL LOW (ref >60)
Glucose, Bld: 220 mg/dL — ABNORMAL HIGH (ref 70–99)
Potassium: 4.2 mmol/L (ref 3.5–5.1)
Sodium: 141 mmol/L (ref 135–145)
Total Bilirubin: 0.9 mg/dL (ref 0.3–1.2)
Total Protein: 4.6 g/dL — ABNORMAL LOW (ref 6.5–8.1)

## 2019-08-30 LAB — GLUCOSE, CAPILLARY
Glucose-Capillary: 164 mg/dL — ABNORMAL HIGH (ref 70–99)
Glucose-Capillary: 167 mg/dL — ABNORMAL HIGH (ref 70–99)
Glucose-Capillary: 190 mg/dL — ABNORMAL HIGH (ref 70–99)
Glucose-Capillary: 195 mg/dL — ABNORMAL HIGH (ref 70–99)
Glucose-Capillary: 203 mg/dL — ABNORMAL HIGH (ref 70–99)
Glucose-Capillary: 206 mg/dL — ABNORMAL HIGH (ref 70–99)
Glucose-Capillary: 217 mg/dL — ABNORMAL HIGH (ref 70–99)

## 2019-08-30 LAB — PHOSPHORUS: Phosphorus: 3.5 mg/dL (ref 2.5–4.6)

## 2019-08-30 LAB — ECHOCARDIOGRAM COMPLETE
Height: 74 in
Weight: 3421.54 oz

## 2019-08-30 LAB — TRIGLYCERIDES: Triglycerides: 70 mg/dL (ref <150)

## 2019-08-30 LAB — T4, FREE: Free T4: 1.29 ng/dL — ABNORMAL HIGH (ref 0.61–1.12)

## 2019-08-30 LAB — TSH: TSH: 1.579 u[IU]/mL (ref 0.350–4.500)

## 2019-08-30 LAB — PREALBUMIN: Prealbumin: <5 mg/dL — ABNORMAL LOW (ref 18–38)

## 2019-08-30 LAB — MAGNESIUM: Magnesium: 2.5 mg/dL — ABNORMAL HIGH (ref 1.7–2.4)

## 2019-08-30 LAB — SURGICAL PATHOLOGY

## 2019-08-30 MED ORDER — INSULIN ASPART 100 UNIT/ML ~~LOC~~ SOLN
2.0000 [IU] | SUBCUTANEOUS | Status: DC
  Administered 2019-08-30: 4 [IU] via SUBCUTANEOUS
  Administered 2019-08-30: 6 [IU] via SUBCUTANEOUS
  Administered 2019-08-30: 4 [IU] via SUBCUTANEOUS
  Administered 2019-08-31: 6 [IU] via SUBCUTANEOUS
  Administered 2019-08-31: 2 [IU] via SUBCUTANEOUS
  Administered 2019-08-31: 4 [IU] via SUBCUTANEOUS
  Administered 2019-08-31 (×2): 2 [IU] via SUBCUTANEOUS
  Administered 2019-08-31: 4 [IU] via SUBCUTANEOUS
  Administered 2019-09-01 (×2): 6 [IU] via SUBCUTANEOUS
  Administered 2019-09-01 (×2): 4 [IU] via SUBCUTANEOUS
  Administered 2019-09-01 – 2019-09-02 (×3): 6 [IU] via SUBCUTANEOUS

## 2019-08-30 MED ORDER — TRAVASOL 10 % IV SOLN
INTRAVENOUS | Status: AC
  Filled 2019-08-30: qty 1056

## 2019-08-30 MED ORDER — HEPARIN (PORCINE) 25000 UT/250ML-% IV SOLN
1250.0000 [IU]/h | INTRAVENOUS | Status: DC
  Administered 2019-08-30: 1200 [IU]/h via INTRAVENOUS
  Administered 2019-08-31: 1300 [IU]/h via INTRAVENOUS
  Administered 2019-09-01: 1250 [IU]/h via INTRAVENOUS
  Filled 2019-08-30 (×3): qty 250

## 2019-08-30 MED ORDER — METHOCARBAMOL 1000 MG/10ML IJ SOLN
500.0000 mg | Freq: Three times a day (TID) | INTRAVENOUS | Status: DC | PRN
  Filled 2019-08-30: qty 5

## 2019-08-30 MED ORDER — FENTANYL CITRATE (PF) 100 MCG/2ML IJ SOLN
12.5000 ug | INTRAMUSCULAR | Status: DC | PRN
  Administered 2019-09-01 – 2019-09-02 (×3): 50 ug via INTRAVENOUS
  Filled 2019-08-30 (×4): qty 2

## 2019-08-30 MED ORDER — ACETAMINOPHEN 10 MG/ML IV SOLN
1000.0000 mg | Freq: Four times a day (QID) | INTRAVENOUS | Status: AC
  Administered 2019-08-30 – 2019-08-31 (×4): 1000 mg via INTRAVENOUS
  Filled 2019-08-30 (×4): qty 100

## 2019-08-30 MED ORDER — LEVOTHYROXINE SODIUM 100 MCG/5ML IV SOLN
75.0000 ug | Freq: Every day | INTRAVENOUS | Status: DC
  Administered 2019-08-30 – 2019-09-03 (×5): 75 ug via INTRAVENOUS
  Filled 2019-08-30 (×6): qty 5

## 2019-08-30 MED ORDER — ENOXAPARIN SODIUM 30 MG/0.3ML ~~LOC~~ SOLN
30.0000 mg | SUBCUTANEOUS | Status: DC
  Administered 2019-08-30: 30 mg via SUBCUTANEOUS
  Filled 2019-08-30: qty 0.3

## 2019-08-30 MED ORDER — FAMOTIDINE IN NACL 20-0.9 MG/50ML-% IV SOLN
20.0000 mg | INTRAVENOUS | Status: DC
  Administered 2019-08-30 – 2019-09-03 (×5): 20 mg via INTRAVENOUS
  Filled 2019-08-30 (×5): qty 50

## 2019-08-30 MED ORDER — LACTATED RINGERS IV BOLUS
1000.0000 mL | Freq: Once | INTRAVENOUS | Status: AC
  Administered 2019-08-30: 1000 mL via INTRAVENOUS

## 2019-08-30 MED ORDER — MORPHINE SULFATE (PF) 2 MG/ML IV SOLN
1.0000 mg | INTRAVENOUS | Status: DC | PRN
  Administered 2019-08-30: 1 mg via INTRAVENOUS
  Filled 2019-08-30: qty 1

## 2019-08-30 MED ORDER — PERFLUTREN LIPID MICROSPHERE
1.0000 mL | INTRAVENOUS | Status: AC | PRN
  Administered 2019-08-30: 3 mL via INTRAVENOUS
  Filled 2019-08-30: qty 10

## 2019-08-30 MED ORDER — DEXTROSE IN LACTATED RINGERS 5 % IV SOLN: INTRAVENOUS | Status: DC

## 2019-08-30 NOTE — Progress Notes (Signed)
eLink Physician-Brief Progress Note Patient Name: Alan Orr DOB: 11-21-1945 MRN: [id number]   Date of Service  08/30/2019  HPI/Events of Note  Patient has Foley catheter, however, no order for Foley catheter.   eICU Interventions  Will order: 1. Continue Foley catheter.     Intervention Category Major Interventions: Other:  [name] 08/30/2019, 8:54 PM

## 2019-08-30 NOTE — Progress Notes (Signed)
Initial Nutrition Assessment  DOCUMENTATION CODES:   Not applicable  INTERVENTION:   -TPN management per pharmacy -RD to follow for diet advancement and add supplements as appropriate  NUTRITION DIAGNOSIS:   Increased nutrient needs related to post-op healing as evidenced by estimated needs.  GOAL:   Patient will meet greater than or equal to 90% of their needs  MONITOR:   Diet advancement, Labs, Skin, I & O's  REASON FOR ASSESSMENT:   Consult New TPN/TNA  ASSESSMENT:   Alan Orr is a 74 y.o. male with medical history significant for coronary artery disease status post 5 vessel CABG in 1997, paroxysmal atrial fibrillation chronically anticoagulated on Pradaxa, ventricular tachycardia status post biventricular ICD, hypertension, type 2 diabetes mellitus, stage III chronic kidney disease with baseline creatinine 1.3, who is admitted to [facility] on 09/08/2019 with bowel perforation  Pt admitted large bowel perforation.   6/15- s/p Procedure: right hemicolectomy with end ileostomy; TPN initiated  Reviewed I/O's: +3.8 L x 24 hours and +5.7 L since admission  UOP: 127 ml x 24 hours   NGT output: 202 ml x 24 hours  Pt lying in bed at time of visit, just finished working with physical therapy. Pt pleasant and in good spirits; he is slightly HOH, so often had to rephrase or repeat questions during interview. Pt reports good appetite PTA, generally consuming 2 meals per day. Per pt, he often consumed softer textured foods, such as applesauce, mashed potatoes, and chopped meat with gravy "as I only have about 6 or 7 teeth- I chew and chew and chew". Pt also reports consuming a lot of fluids, such as water.   Pt denies any weight loss; reports UBW is around 218#. Noted pt with moderate edema in upper extremities, which may be masking weight loss as well as fat and muscle depletions. Per wt hx, wt has been stable over the past year.    Pt with NGT connected to  low, intermittent suction. RD fed pt two ice chips during visit per his request, which he tolerated without difficulty. RD discussed with pt rationale for NGT and NPO status. Also explained pt how he will receive his nutrition at this time (TPN). Pt currently receiving TPN at 40 ml/hr, which is providing 1021 kcals and  53 grams protein, which meets 43% of estimated kcal needs and 42% of estimated protein needs. Per pharmacy note, plan to increase TPN to 80 ml/hr at 1800, which will provide 2080 kcals and 105 grams protein, which meets 87% of estimated kcals and 84% of estimated protein needs. Case discussed with pharmacy.   Medications reviewed and include levophed and lovenox.   Lab Results  Component Value Date   HGBA1C 6.0 (H) 08/18/2019   PTA DM medications are none.   Labs reviewed: K and Phos WDL. Mg: 2.5. CBGS: 195-217 (inpatient orders for glycemic control are 2-6 units insulin aspart every 4 hours).   NUTRITION - FOCUSED PHYSICAL EXAM:    Most Recent Value  Orbital Region Moderate depletion  Upper Arm Region No depletion  Thoracic and Lumbar Region No depletion  Buccal Region No depletion  Temple Region Mild depletion  Clavicle Bone Region No depletion  Clavicle and Acromion Bone Region No depletion  Scapular Bone Region No depletion  Dorsal Hand No depletion  Patellar Region No depletion  Anterior Thigh Region No depletion  Posterior Calf Region No depletion  Edema (RD Assessment) Moderate  Hair Reviewed  Eyes Reviewed  Mouth Reviewed  Skin Reviewed  Nails Reviewed       Diet Order:   Diet Order            Diet NPO time specified Except for: Sips with Meds, Ice Chips  Diet effective now                 EDUCATION NEEDS:   Education needs have been addressed  Skin:  Skin Assessment: Skin Integrity Issues: Skin Integrity Issues:: Incisions Incisions: abdomen, new ileostomy  Last BM:  Unknown  Height:   Ht Readings from Last 1 Encounters:  08/30/2019 6'  2" (1.88 m)    Weight:   Wt Readings from Last 1 Encounters:  08/29/19 97 kg    Ideal Body Weight:  86.4 kg  BMI:  Body mass index is 27.46 kg/m.  Estimated Nutritional Needs:   Kcal:  2400-2600  Protein:  125-150 grams  Fluid:  > 2.4 L    Loistine Chance, RD, LDN, [place] Registered Dietitian II Certified Diabetes Care and Education Specialist Please refer to [facility] for RD and/or RD on-call/weekend/after hours pager

## 2019-08-30 NOTE — Consult Note (Signed)
Cardiology Consultation:   Patient ID: DE JAWORSKI MRN: [id number]; DOB: Jul 07, 1945  Admit date: 08/27/2019 Date of Consult: 08/30/2019  Primary Care Provider: [name], MD [facility] Cardiologist: Sanda Klein, MD / Quay Burow, MD [facility] Electrophysiologist:  None    Patient Profile:   JAHQUEZ STEFFLER is a 74 y.o. male with a history of CAD s/p remote CABG x5 in 1997, ischemic cardiomyopathy with improved EF of 50-55% on last Echo in 2016 s/p Medtronic CRT-D, paroxysmal atrial fibrillation on Amiodarone and Pradaxa, hypertension, hyperlipidemia, type 2 diabetes mellitus, and prior DVT who is being seen today for the evaluation of LV thrombus at the request of Dr. Chase Caller.  History of Present Illness:   Mr. Mondo is a 74 year old male with the above history. He has a significant cardiac history. Known CAD with remote CABG x5 in 1997. Patient presented with VT storm in 04/2009. He underwent cardiac catheterization at that time which showed patent grafts with moderate LV dysfunction. VT was felt to be related to hyperkalemia. He had Medtronic ICD placed during this admission. Last ischemic evaluation was a Myoview in 03/2012 which showed a scar in the anterior wall , apex, and septum with EF of 42%. In 2016, he underwent TEE in setting of bacteremia which showed improved EF of 50-55%. He had a gen change in 2017 with placement of CRT-D. This is followed by Dr. Sallyanne Kuster. Patient was last seen by Dr. Sallyanne Kuster in 06/2019 at which time he was doing well and denied any cardiac symptoms. His device was interrogated and showed 99% biventricular pacing and approximately 61% atrial pacing. Lead parameters were excellent. His Optivol had been fairly volatile and out of range but he was asymptomatic and had no evidence of edema on exam.   Patient was recently admitted at [facility] from 08/18/2019 to 08/22/2019 after being found on the floor by his family. He was found to  have a right humerus fracture. He reported multiple falls. Falls were felt to possible be related to polypharmacy in the setting of benzodiazepine use. It was felt that it was unsafe for patient to continue to live alone so he was discharged to a SNF.   Patient presented back to ED 08/18/2019 for worsening abdominal pain and distension. Upon arrival to ED, patient tachypneic and BP was soft. Labs remarkable for WBC of 11.8, K of 2.8, Creatinine of 2.52 (recent baseline around 1.2 to 1.3, AST 116, ALT 60. CT showed significant pneumoperitoneum with possibility of a perforated ascending colon/hepatic flexure perforation. He was admitted with septic shock and started on Levophed, IV fluids, and IV antibiotics. General surgery was consulted and performed right hemicolectomy with end ileostomy on 08/29/2019. Echo was ordered today and was interpreted as preserved EF with akinesis with the entire apical segment and grade 1 diastolic dysfunction. Noted to have a 2.8 to 1.4cm mobile protruding thrombus in the LV apex. Cardiology consulted for further evaluation.  I have independently reviewed the images from this echo. LVEF is borderline, 50%, with akinetic apex. 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 RV function is mild-moderately reduced. I reviewed the images from the TEE from 2016. Transgastric view suggest LV apex has been akinetic, likely since his initial diagnosis of ischemic cardiomyopathy given that on Nuc in 2014 he had evidence of scar in the apex.   Patient is extremely hard of hearing and is interactive but succinct in his answers. Denies current chest pain and denies chest pain prior to  admission. Denies SOB or dizziness/lightheadedness.    Past Medical History:  Diagnosis Date  . CAD (coronary artery disease)   . DM (diabetes mellitus) ([place])    type 2   . DVT (deep venous thrombosis) ([place])   . Hyperlipidemia   . Hypertension   . ICD (implantable cardioverter-defibrillator), single, in situ   .  Ischemic cardiomyopathy   . PAF (paroxysmal atrial fibrillation) ([place])   . RBBB   . S/P CABG x 5     Past Surgical History:  Procedure Laterality Date  . BACK SURGERY    . CARDIAC CATHETERIZATION  05/08/2009   small vessel disease  . CARDIAC DEFIBRILLATOR PLACEMENT  05/09/2009   Medtronic  . CORONARY ARTERY BYPASS GRAFT  12/31/1995   LIMA to LAD,SVG to intermediate,SVG to CX,seq. svg to posterior descending and posterolateral RCA  . EP IMPLANTABLE DEVICE N/A 09/25/2015   Procedure: PPM/BIV PPM Generator Changeout;  Surgeon: Evans Lance, MD;  Location: [place] [facility];  Service: Cardiovascular;  Laterality: N/A;  . I & D EXTREMITY Left 11/03/2013   Procedure: Left Leg Debride Ulcer, Apply Wound VAC and [place];  Surgeon: Newt Minion, MD;  Location: [place];  Service: Orthopedics;  Laterality: Left;  . ILEOSTOMY Right 09/13/2019   Procedure: ILEOSTOMY;  Surgeon: Kinsinger, Arta Bruce, MD;  Location: [place];  Service: General;  Laterality: Right;  . LAPAROTOMY N/A 09/13/2019   Procedure: EXPLORATORY LAPAROTOMY,;  Surgeon: Kieth Brightly Arta Bruce, MD;  Location: [place];  Service: General;  Laterality: N/A;  . PARTIAL COLECTOMY Right 08/27/2019   Procedure: RIGHT COLECTOMY;  Surgeon: Kieth Brightly Arta Bruce, MD;  Location: [place];  Service: General;  Laterality: Right;  . TEE WITHOUT CARDIOVERSION N/A 01/11/2015   Procedure: TRANSESOPHAGEAL ECHOCARDIOGRAM (TEE);  Surgeon: Herminio Commons, MD;  [facility];  Service: Cardiology;  Laterality: N/A;     Home Medications:  Prior to Admission medications   Medication Sig Start Date End Date Taking? Authorizing Provider  acetaminophen (TYLENOL) 325 MG tablet Take 650 mg by mouth every 6 (six) hours as needed for mild pain or moderate pain.   Yes [provider]  ALPRAZolam Duanne Moron) 1 MG tablet Take 1 tablet (1 mg total) by mouth 2 (two) times daily as needed for anxiety or sleep. 08/22/19  Yes Shah, Pratik D, DO  amiodarone  (PACERONE) 200 MG tablet Take 200 mg by mouth daily.   Yes [provider]  aspirin EC 81 MG tablet Take 81 mg by mouth at bedtime.   Yes [provider]  atorvastatin (LIPITOR) 40 MG tablet Take 40 mg by mouth at bedtime.   Yes [provider]  dabigatran (PRADAXA) 75 MG CAPS capsule Take 1 capsule (75 mg total) by mouth every 12 (twelve) hours. 08/16/19  Yes Danford, Suann Larry, MD  ezetimibe (ZETIA) 10 MG tablet TAKE 1 TABLET ONCE DAILY FOR CHOLESTEROL. Patient taking differently: Take 10 mg by mouth daily.  01/04/17  Yes Lorretta Harp, MD  ferrous sulfate 325 (65 FE) MG tablet Take 325 mg by mouth daily with breakfast.   Yes [provider]  loperamide (IMODIUM) 2 MG capsule Take 1 capsule (2 mg total) by mouth as needed for diarrhea or loose stools. 08/22/19  Yes Shah, Pratik D, DO  magnesium oxide (MAG-OX) 400 MG tablet Take 400 mg by mouth daily.   Yes [provider]  metoprolol tartrate (LOPRESSOR) 50 MG tablet TAKE 1 TABLET BY MOUTH TWICE DAILY. Patient taking  differently: Take 50 mg by mouth 2 (two) times daily.  10/10/18  Yes Evans Lance, MD  Multiple Vitamin (MULTI-VITAMINS) TABS Take 1 tablet by mouth daily.   Yes [provider]  niacin (NIASPAN) 1000 MG CR tablet TAKE (1) TABLET BY MOUTH AT BEDTIME. Patient taking differently: Take 1,000 mg by mouth at bedtime.  10/15/16  Yes Bayard Hugger, MD  nitroGLYCERIN (NITROSTAT) 0.4 MG SL tablet Place 0.4 mg under the tongue every 5 (five) minutes x 3 doses as needed for chest pain. 10/31/15  Yes [provider]  oxyCODONE 10 MG TABS Take 1 tablet (10 mg total) by mouth every 6 (six) hours as needed for severe pain or breakthrough pain (for pain). 08/22/19  Yes Shah, Pratik D, DO  ramipril (ALTACE) 2.5 MG capsule Take 2.5 mg by mouth daily.   Yes [provider]  SYNTHROID 137 MCG tablet Take 137 mcg by mouth daily before breakfast.  07/20/16  Yes [provider]      Inpatient Medications: Scheduled Meds: . chlorhexidine  15 mL Mouth Rinse BID  . Chlorhexidine Gluconate Cloth  6 each Topical Daily  . enoxaparin (LOVENOX) injection  30 mg Subcutaneous Q24H  . insulin aspart  2-6 Units Subcutaneous Q4H  . levothyroxine  75 mcg Intravenous Daily  . mouth rinse  15 mL Mouth Rinse q12n4p  . sodium chloride flush  10-40 mL Intracatheter Q12H  . sodium chloride flush  3 mL Intravenous Q12H   Continuous Infusions: . acetaminophen Stopped (08/30/19 1133)  . dextrose 5% lactated ringers 100 mL/hr at 08/30/19 1500  . famotidine (PEPCID) IV Stopped (08/30/19 1122)  . methocarbamol (ROBAXIN) IV    . norepinephrine (LEVOPHED) Adult infusion 1 mcg/min (08/30/19 1500)  . piperacillin-tazobactam (ZOSYN)  IV 12.5 mL/hr at 08/30/19 1500  . TPN ADULT (ION) 40 mL/hr at 08/30/19 1500  . TPN ADULT (ION)     PRN Meds: fentaNYL (SUBLIMAZE) injection, methocarbamol (ROBAXIN) IV, ondansetron (ZOFRAN) IV, sodium chloride flush  Allergies:   No Known Allergies  Social History:   Social History   Socioeconomic History  . Marital status: Legally Separated    Spouse name: Not on file  . Number of children: Not on file  . Years of education: Not on file  . Highest education level: Not on file  Occupational History  . Not on file  Tobacco Use  . Smoking status: Former Smoker    Packs/day: 1.50    Years: 35.00    Pack years: 52.50    Types: Cigarettes    Quit date: 11/03/1995    Years since quitting: 23.8  . Smokeless tobacco: Never Used  Vaping Use  . Vaping Use: Never used  Substance and Sexual Activity  . Alcohol use: Yes    Comment: occasionally  . Drug use: No  . Sexual activity: Not on file  Other Topics Concern  . Not on file  Social History Narrative  . Not on file   Social Determinants of Health   Financial Resource Strain:   . Difficulty of Paying Living Expenses:   Food Insecurity:   . Worried About Charity fundraiser in the Last  Year:   . Arboriculturist in the Last Year:   Transportation Needs:   . Film/video editor (Medical):   Marland Kitchen Lack of Transportation (Non-Medical):   Physical Activity:   . Days of Exercise per Week:   . Minutes of Exercise per Session:   Stress:   .  Feeling of Stress :   Social Connections:   . Frequency of Communication with Friends and Family:   . Frequency of Social Gatherings with Friends and Family:   . Attends Religious Services:   . Active Member of Clubs or Organizations:   . Attends Archivist Meetings:   Marland Kitchen Marital Status:   Intimate Partner Violence:   . Fear of Current or Ex-Partner:   . Emotionally Abused:   Marland Kitchen Physically Abused:   . Sexually Abused:     Family History:    Family History  Problem Relation Age of Onset  . Heart attack Mother   . Stroke Father      ROS:  Please see the history of present illness.   All other ROS reviewed and negative.     Physical Exam/Data:   Vitals:   08/30/19 1300 08/30/19 1330 08/30/19 1400 08/30/19 1500  BP: (!) 113/43 119/90 (!) 138/43 (!) 131/55  Pulse: 75 79 77 76  Resp: 20 18 15 19   Temp:      TempSrc:      SpO2: 99% 98% 99% 98%  Weight:      Height:        Intake/Output Summary (Last 24 hours) at 08/30/2019 1709 Last data filed at 08/30/2019 1500 Gross per 24 hour  Intake 3100.16 ml  Output 294 ml  Net 2806.16 ml   Last 3 Weights 08/29/2019 08/29/2019 09/06/2019  Weight (lbs) 213 lb 13.5 oz 213 lb 13.5 oz 211 lb 10.3 oz  Weight (kg) 97 kg 97 kg 96 kg     Body mass index is 27.46 kg/m.   Physical Exam per MD: Constitutional: appears medically frail Eyes: sclera non-icteric ENMT: moist mucous membranes Cardiovascular: regular rhythm, normal rate, no murmurs. No jugular venous distention.  Respiratory: clear anteriorly GI : distended, not tense, open abdomen. MSK: warm, 2-3 + pitting edema NEURO: grossly nonfocal exam, moves all extremities. PSYCH: alert and oriented x 3, normal mood and  affect.    EKG:  The EKG was personally reviewed and demonstrates:  V-paced with underlying sinus rhythm, 1st degree AV block, and RBBB.  Telemetry:  Telemetry was personally reviewed and demonstrates:  Ventricular pacing and sinus rhythm.   Relevant CV Studies:  Echocardiogram 08/30/2019: Impressions: 1. There is a 2.8 x 1.4 cm mobile protruding thrombus in the LV apex.  Left ventricular ejection fraction, by estimation, is 60 to 65%. The left  ventricle has normal function. The left ventricle has no regional wall  motion abnormalities. Left ventricular  diastolic parameters are consistent with Grade I diastolic dysfunction  (impaired relaxation). There is akinesis of the left ventricular, entire  apical segment.  2. Right ventricular systolic function is normal. The right ventricular  size is normal. Tricuspid regurgitation signal is inadequate for assessing  PA pressure.  3. The mitral valve is normal in structure. No evidence of mitral valve  regurgitation. No evidence of mitral stenosis.  4. The aortic valve was not well visualized. Aortic valve regurgitation  is not visualized. No aortic stenosis is present.  5. The inferior vena cava is normal in size with <50% respiratory  variability, suggesting right atrial pressure of 8 mmHg.  6. Report of LV apical thrombus called to Dr. Chase Caller   Laboratory Data:  High Sensitivity Troponin:   Recent Labs  Lab 08/15/19 1545 08/15/19 1732 08/17/19 2004 08/17/19 2148 08/30/19 1322  TROPONINIHS 13 13 19* 20* 36*     Chemistry Recent Labs  Lab 08/29/19  0947 08/29/19 1555 08/30/19 0512  NA 142 140 141  K 3.0* 4.2 4.2  CL 113* 111 110  CO2 20* 21* 22  GLUCOSE 174* 162* 220*  BUN 29* 39* 48*  CREATININE 2.48* 2.94* 3.43*  CALCIUM 6.3* 7.1* 7.4*  GFRNONAA 25* 20* 17*  GFRAA 29* 23* 19*  ANIONGAP 9 8 9     Recent Labs  Lab 08/25/2019 1740 08/29/19 0549 08/30/19 0512  PROT 5.7* 4.0* 4.6*  ALBUMIN 2.0* 1.9* 1.8*   AST 116* 245* 220*  ALT 60* 91* 115*  ALKPHOS 96 60 73  BILITOT 1.1 1.2 0.9   Hematology Recent Labs  Lab 08/29/19 0549 08/29/19 1555 08/30/19 0512  WBC 8.1 12.0* 14.6*  RBC 2.43* 2.71* 2.67*  HGB 8.5* 9.6* 9.4*  HCT 25.9* 29.3* 28.9*  MCV 106.6* 108.1* 108.2*  MCH 35.0* 35.4* 35.2*  MCHC 32.8 32.8 32.5  RDW 16.4* 16.4* 16.5*  PLT 296 367 403*   BNPNo results for input(s): BNP, PROBNP in the last 168 hours.  DDimer No results for input(s): DDIMER in the last 168 hours.   Radiology/Studies:  CT ABDOMEN PELVIS WO CONTRAST  Result Date: 09/09/2019 CLINICAL DATA:  Diffuse abdominal pain. EXAM: CT ABDOMEN AND PELVIS WITHOUT CONTRAST TECHNIQUE: Multidetector CT imaging of the abdomen and pelvis was performed following the standard protocol without IV contrast. COMPARISON:  CT scan dated 02/16/2007 FINDINGS: Lower chest: There are tiny bilateral pleural effusions. Aortic atherosclerosis. Coronary artery calcifications. CABG. Defibrillator in place. Hepatobiliary: Liver parenchyma is normal. Gallbladder is distended. No bile duct dilatation. Pancreas: Unremarkable. No pancreatic ductal dilatation or surrounding inflammatory changes. Spleen: Spleen is atrophic. Adrenals/Urinary Tract: Normal adrenal glands and kidneys. No hydronephrosis. Bladder is empty. Stomach/Bowel: There is extensive free air in the abdomen. There is also small amount ascites in the abdomen. There are multiple gas bubbles in the ascites around the normal with the right lobe of the liver as well as in the mesentery and most prominently around the patent flexure of the colon. This may be the site of perforation. No diverticular disease. There is mucosal thickening of the ascending and hepatic flexure portions of the colon stomach and small bowel appear normal. Appendix is normal. Vascular/Lymphatic: Extensive aortic atherosclerosis. No adenopathy. Reproductive: Prostate is unremarkable. Other: Small ascites. Extensive free  air in the abdomen. Slight edema in the subcutaneous fat of the flanks and lateral aspects of the hips. Musculoskeletal: No acute abnormality. Extensive surgical lumbar fusion. IMPRESSION: 1. Extensive free air in the abdomen consistent with perforation of the bowel. The most prominent air in the peritoneal fat is adjacent to the hepatic flexure of the colon and there is edema of the mucosa of the ascending and hepatic flexure portions of the colon suggesting colitis. 2. Tiny bilateral pleural effusions. 3.  Aortic Atherosclerosis (ICD10-I70.0). Critical Value/emergent results were called by telephone at the time of interpretation on 09/05/2019 at 6:55 pm to provider JOSEPH ZAMMIT , who verbally acknowledged these results. Electronically Signed   By: Lorriane Shire M.D.   On: 08/23/2019 19:03   DG Chest Port 1 View  Result Date: 08/29/2019 CLINICAL DATA:  NG tube placement EXAM: PORTABLE CHEST 1 VIEW COMPARISON:  12/25/2017 FINDINGS: NG tube is in place with the tip in the stomach. A ICD within the heart, unchanged. Prior CABG. IMPRESSION: NG tube tip in the stomach. Electronically Signed   By: Rolm Baptise M.D.   On: 08/29/2019 02:38   ECHOCARDIOGRAM COMPLETE  Result Date: 08/30/2019    ECHOCARDIOGRAM  REPORT   Patient Name:   MAYUR DUMAN Date of Exam: 08/30/2019 Medical Rec #:  [id number]        Height:       74.0 in Accession #:    [id number]       Weight:       213.8 lb Date of Birth:  1945/12/09        BSA:          2.236 m Patient Age:    35 years         BP:           118/55 mmHg Patient Gender: M                HR:           78 bpm. Exam Location:  Inpatient Procedure: 2D Echo, Color Doppler, Cardiac Doppler, Intracardiac Opacification            Agent and 3D Echo Indications:    Elevated Troponins  History:        Patient has prior history of Echocardiogram examinations, most                 recent 01/11/2015. Pacemaker, Defibrillator and Prior CABG,                 Arrythmias:Atrial Fibrillation;  Risk Factors:Hypertension,                 Diabetes and Dyslipidemia.  Sonographer:    Raquel Sarna Senior RDCS Referring Phys: [id number] [facility]  Sonographer Comments: Scanned supine due to arm fracture. IMPRESSIONS  1. There is a 2.8 x 1.4 cm mobile protruding thrombus in the LV apex. Left ventricular ejection fraction, by estimation, is 60 to 65%. The left ventricle has normal function. The left ventricle has no regional wall motion abnormalities. Left ventricular  diastolic parameters are consistent with Grade I diastolic dysfunction (impaired relaxation). There is akinesis of the left ventricular, entire apical segment.  2. Right ventricular systolic function is normal. The right ventricular size is normal. Tricuspid regurgitation signal is inadequate for assessing PA pressure.  3. The mitral valve is normal in structure. No evidence of mitral valve regurgitation. No evidence of mitral stenosis.  4. The aortic valve was not well visualized. Aortic valve regurgitation is not visualized. No aortic stenosis is present.  5. The inferior vena cava is normal in size with <50% respiratory variability, suggesting right atrial pressure of 8 mmHg.  6. Report of LV apical thrombus called to Dr. Chase Caller FINDINGS  Left Ventricle: There is a 2.8 x 1.4 cm mobile protruding thrombus in the LV apex. Left ventricular ejection fraction, by estimation, is 60 to 65%. The left ventricle has normal function. The left ventricle has no regional wall motion abnormalities. Definity contrast agent was given IV to delineate the left ventricular endocardial borders. The left ventricular internal cavity size was normal in size. There is no left ventricular hypertrophy. Left ventricular diastolic parameters are consistent with Grade I diastolic dysfunction (impaired relaxation). Normal left ventricular filling pressure. Right Ventricle: The right ventricular size is normal. No increase in right ventricular wall thickness. Right ventricular  systolic function is normal. Tricuspid regurgitation signal is inadequate for assessing PA pressure. Left Atrium: Left atrial size was normal in size. Right Atrium: Right atrial size was normal in size. Pericardium: There is no evidence of pericardial effusion. Mitral Valve: The mitral valve is normal in structure. Normal mobility of the mitral valve leaflets. No  evidence of mitral valve regurgitation. No evidence of mitral valve stenosis. Tricuspid Valve: The tricuspid valve is normal in structure. Tricuspid valve regurgitation is not demonstrated. No evidence of tricuspid stenosis. Aortic Valve: The aortic valve was not well visualized. Aortic valve regurgitation is not visualized. No aortic stenosis is present. Pulmonic Valve: The pulmonic valve was normal in structure. Pulmonic valve regurgitation is not visualized. No evidence of pulmonic stenosis. Aorta: The aortic root is normal in size and structure. Venous: The inferior vena cava is normal in size with less than 50% respiratory variability, suggesting right atrial pressure of 8 mmHg. IAS/Shunts: No atrial level shunt detected by color flow Doppler. Additional Comments: A pacer wire is visualized.  LEFT VENTRICLE PLAX 2D LVIDd:         5.40 cm  Diastology LVIDs:         4.10 cm  LV e' lateral:   8.59 cm/s LV PW:         0.90 cm  LV E/e' lateral: 7.9 LV IVS:        0.90 cm  LV e' medial:    6.96 cm/s LVOT diam:     2.00 cm  LV E/e' medial:  9.7 LV SV:         58 LV SV Index:   26 LVOT Area:     3.14 cm  RIGHT VENTRICLE RV S prime:     7.72 cm/s TAPSE (M-mode): 1.6 cm LEFT ATRIUM           Index       RIGHT ATRIUM           Index LA diam:      3.50 cm 1.57 cm/m  RA Area:     12.50 cm LA Vol (A4C): 75.7 ml 33.85 ml/m RA Volume:   25.60 ml  11.45 ml/m  AORTIC VALVE LVOT Vmax:   85.50 cm/s LVOT Vmean:  65.800 cm/s LVOT VTI:    0.186 m  AORTA Ao Root diam: 3.40 cm MITRAL VALVE MV Area (PHT): 3.34 cm    SHUNTS MV Decel Time: 227 msec    Systemic VTI:  0.19 m  MV E velocity: 67.70 cm/s  Systemic Diam: 2.00 cm MV A velocity: 81.00 cm/s MV E/A ratio:  0.84 Fransico Him MD Electronically signed by Fransico Him MD Signature Date/Time: 08/30/2019/4:44:51 PM    Final     Assessment and Plan:   1. 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  I have discussed this with the patient and we have participated in shared decision making.  I performed a thorough chart review, I do not see evidence of interruption of his Pradaxa until this hospitalization, prescribed for atrial fibrillation.  He did, however, receive Praxbind in anticipation of surgery for bowel perforation. Therefore, this may not represent failure of Pradaxa.  This thrombus requires anticoagulation.  Given recent surgery and open abdominal incision, with the risk for bleeding, the safest option for anticoagulation may be intravenous heparin, in the event that he has bleeding. For continued management of LV thrombus, could consider resuming Pradaxa when safe from postoperative standpoint. However, for LV thrombus, warfarin has been shown to be superior, and this may be worth considering, particularly if he plans to remain in skill nursing care. Will discuss optimal therapy as patient recovers from surgery.   2. PAF -  currently V paced with underlying sinus rhythm. Pradaxa held for surgery and reversed with praxbind.   3. Ischemic CM with CRT-D 4. 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 - resume amiodarone when tolerated. - hold BB, ACE-I  5. AKI and decreased urine output - query third spacing in setting of sepsis. LVEF is borderline at 50%, but no definite signs of gross biventricular failure or cardiogenic shock. Suspect apical regional wall motion abnormalities are longstanding.   - Consider nephrology involvement for oliguria and AKI      For questions or updates, please contact [place] Please consult [email or web address] for contact info under    Signed, Elouise Munroe, MD 08/30/19 10:06 PM  CRITICAL CARE Performed by: Cherlynn Kaiser, MD   Total critical care time: 40 minutes   Critical care time was exclusive of separately billable procedures and treating other patients.   Critical care was necessary to treat or prevent imminent or life-threatening deterioration.   Critical care was time spent personally by me (independent of APPs or residents) on the following activities: development of treatment plan with patient and/or surrogate as well as nursing, discussions with consultants, evaluation of patient's response to treatment, examination of patient, obtaining history from patient or surrogate, ordering and performing treatments and interventions, ordering and review of laboratory studies, ordering and review of radiographic studies, pulse oximetry and re-evaluation of patient's condition. Extensive chart review also performed, and communication with patient's nurse to coordinate anticoagulation plan.

## 2019-08-30 NOTE — Progress Notes (Signed)
Echocardiogram 2D Echocardiogram has been performed.  Alan [name] 08/30/2019, 3:04 PM   Dr. Radford Pax notified of critical result at 2:58

## 2019-08-30 NOTE — Progress Notes (Signed)
Patient ID: Alan Orr, male   DOB: 05-25-1945, 74 y.o.   MRN: [id number]    2 Days Post-Op  Subjective: Moved to unit yesterday secondary to some hypotension.  Currently on 22mcg of levo.  No bowel function currently.    ROS: See above, otherwise other systems negative  Objective: Vital signs in last 24 hours: Temp:  [95.7 F (35.4 C)-99.5 F (37.5 C)] 98.3 F (36.8 C) (06/16 0800) Pulse Rate:  [64-89] 83 (06/16 0745) Resp:  [13-24] 21 (06/16 0745) BP: (79-143)/(28-105) 134/48 (06/16 0745) SpO2:  [90 %-100 %] 98 % (06/16 0752) Last BM Date:  (PTA)  Intake/Output from previous day: 06/15 0701 - 06/16 0700 In: 4091.7 [I.V.:1471.1; IV Piggyback:2620.6] Out: 329 [Urine:127; Emesis/NG output:202] Intake/Output this shift: Total I/O In: -  Out: 17 [Urine:17]  PE: Abd: soft, appropriately tender, no output in ileostomy currently, NGT with minima to no output.  Midline wound is clean and packed.  Fascia intact.  Lab Results:  Recent Labs    08/29/19 1555 08/30/19 0512  WBC 12.0* 14.6*  HGB 9.6* 9.4*  HCT 29.3* 28.9*  PLT 367 403*   BMET Recent Labs    08/29/19 1555 08/30/19 0512  NA 140 141  K 4.2 4.2  CL 111 110  CO2 21* 22  GLUCOSE 162* 220*  BUN 39* 48*  CREATININE 2.94* 3.43*  CALCIUM 7.1* 7.4*   PT/INR Recent Labs    08/27/2019 1740  LABPROT 19.9*  INR 1.8*   CMP     Component Value Date/Time   NA 141 08/30/2019 0512   NA 144 06/26/2019 0930   K 4.2 08/30/2019 0512   CL 110 08/30/2019 0512   CO2 22 08/30/2019 0512   GLUCOSE 220 (H) 08/30/2019 0512   BUN 48 (H) 08/30/2019 0512   BUN 15 06/26/2019 0930   CREATININE 3.43 (H) 08/30/2019 0512   CREATININE 1.42 (H) 09/20/2015 1004   CALCIUM 7.4 (L) 08/30/2019 0512   PROT 4.6 (L) 08/30/2019 0512   PROT 6.6 06/26/2019 0930   ALBUMIN 1.8 (L) 08/30/2019 0512   ALBUMIN 3.6 (L) 06/26/2019 0930   AST 220 (H) 08/30/2019 0512   ALT 115 (H) 08/30/2019 0512   ALKPHOS 73 08/30/2019 0512   BILITOT 0.9  08/30/2019 0512   BILITOT 0.6 06/26/2019 0930   GFRNONAA 17 (L) 08/30/2019 0512   GFRAA 19 (L) 08/30/2019 0512   Lipase     Component Value Date/Time   LIPASE 23 09/12/2019 1740       Studies/Results: CT ABDOMEN PELVIS WO CONTRAST  Result Date: 09/12/2019 CLINICAL DATA:  Diffuse abdominal pain. EXAM: CT ABDOMEN AND PELVIS WITHOUT CONTRAST TECHNIQUE: Multidetector CT imaging of the abdomen and pelvis was performed following the standard protocol without IV contrast. COMPARISON:  CT scan dated 02/16/2007 FINDINGS: Lower chest: There are tiny bilateral pleural effusions. Aortic atherosclerosis. Coronary artery calcifications. CABG. Defibrillator in place. Hepatobiliary: Liver parenchyma is normal. Gallbladder is distended. No bile duct dilatation. Pancreas: Unremarkable. No pancreatic ductal dilatation or surrounding inflammatory changes. Spleen: Spleen is atrophic. Adrenals/Urinary Tract: Normal adrenal glands and kidneys. No hydronephrosis. Bladder is empty. Stomach/Bowel: There is extensive free air in the abdomen. There is also small amount ascites in the abdomen. There are multiple gas bubbles in the ascites around the normal with the right lobe of the liver as well as in the mesentery and most prominently around the patent flexure of the colon. This may be the site of perforation. No diverticular disease. There is mucosal  thickening of the ascending and hepatic flexure portions of the colon stomach and small bowel appear normal. Appendix is normal. Vascular/Lymphatic: Extensive aortic atherosclerosis. No adenopathy. Reproductive: Prostate is unremarkable. Other: Small ascites. Extensive free air in the abdomen. Slight edema in the subcutaneous fat of the flanks and lateral aspects of the hips. Musculoskeletal: No acute abnormality. Extensive surgical lumbar fusion. IMPRESSION: 1. Extensive free air in the abdomen consistent with perforation of the bowel. The most prominent air in the peritoneal  fat is adjacent to the hepatic flexure of the colon and there is edema of the mucosa of the ascending and hepatic flexure portions of the colon suggesting colitis. 2. Tiny bilateral pleural effusions. 3.  Aortic Atherosclerosis (ICD10-I70.0). Critical Value/emergent results were called by telephone at the time of interpretation on 08/25/2019 at 6:55 pm to provider JOSEPH ZAMMIT , who verbally acknowledged these results. Electronically Signed   By: Lorriane Shire M.D.   On: 09/08/2019 19:03   DG Chest Port 1 View  Result Date: 08/29/2019 CLINICAL DATA:  NG tube placement EXAM: PORTABLE CHEST 1 VIEW COMPARISON:  12/25/2017 FINDINGS: NG tube is in place with the tip in the stomach. A ICD within the heart, unchanged. Prior CABG. IMPRESSION: NG tube tip in the stomach. Electronically Signed   By: Rolm Baptise M.D.   On: 08/29/2019 02:38    Anti-infectives: Anti-infectives (From admission, onward)   Start     Dose/Rate Route Frequency Ordered Stop   08/29/19 1200  piperacillin-tazobactam (ZOSYN) IVPB 3.375 g     Discontinue     3.375 g 12.5 mL/hr over 240 Minutes Intravenous Every 8 hours 08/29/19 0405     08/29/19 0415  piperacillin-tazobactam (ZOSYN) IVPB 3.375 g        3.375 g 12.5 mL/hr over 240 Minutes Intravenous STAT 08/29/19 0405 08/29/19 0954   08/26/2019 1915  piperacillin-tazobactam (ZOSYN) IVPB 3.375 g        3.375 g 100 mL/hr over 30 Minutes Intravenous  Once 08/16/2019 1905 08/19/2019 2136       Assessment/Plan CAD s/p CABGx5 PAF - normally on pradaxa Biventricular ICD Ischemic cardiomyopathy HTN HLD AKI on Stage III CKD - Cr up to 3.43 today, cont foley for strict monitoring.  UOP only 127cc yesterday. Hx of DVT T2DM - SSI Hypothyroidism Hypokalemia/hypocalcemia - replace IV Septic shock - low dose pressor need.  Wean as able PCM - prealbumin <5.  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 Kieth Brightly for perforated R colon with feculent  peritonitis - Continue NGT on LIWS and await bowel function - continue IV abx, high risk for post-op abscess.  WBC up to 14.6, but not unexpected this soon post op.  Will monitor - cont TNA  - PT/OT consults - BID dressing changes - WOC consult for new ileostomy  FEN: NPO, IVF, NGT to LIWS VTE: SCDs, Lovenox ID: IV Zosyn 6/14>>   LOS: 2 days    Henreitta Cea , [facility] Surgery 08/30/2019, 8:45 AM Please see Amion for pager number during day hours 7:00am-4:30pm or 7:00am -11:30am on weekends

## 2019-08-30 NOTE — Progress Notes (Addendum)
Brief Progress Note  Echo showing 2.8x1.4 cm mobile protruding thrombus in LV apex. LVEF 60-65% with normal function. No LVH. Grade I diastolic dysfunction.  - Spoke with [name] from [facility] for Cardiology consult, appreciate the help  Alan Orr, MS4

## 2019-08-30 NOTE — Progress Notes (Signed)
[facility] Surgery on-call surgeon in regards to starting heparin gtt at Cardiologist request for LV thrombus. On call surgeon stated heparin gtt is fine but requested no bolus be given. Oletta Darter, MD notified and is placing Heparin pharmacy consult.

## 2019-08-30 NOTE — Evaluation (Signed)
Occupational Therapy Evaluation Patient Details Name: Alan Orr MRN: [id number] DOB: 02/13/1946 Today's Date: 08/30/2019    History of Present Illness 74 year old male with PMH as below, which is significant for CAD status post CABG, afib on Pradaxa, VT s/p AICD, CKD 3. He lives alone and is fairly independent per family. Recently admitted to [facility] after fall for R humerus fracture. There was concern for polypharmacy and for inability to self care so he was discharged to SNF. Pt [facility] to [facility] on 6/14 with abdominal distension and pain found to have acute abdomen. Pt underwent R hemicolectomy with end ileostomy. Pt with hypotension post-procedure transferred to ICU.   Clinical Impression   Pt admitted with above diagnoses- recently admitted s/p fall with R humerus fx and d/c to SNF. At time of eval, pt completing bed mobility at max A level. Pt with limited tolerance this date due to fatigue. Remained of session focused on RUE function. He states he was able to feed self with RUE PTA, but at this time has difficulty completing a functional hand to mouth pattern without mod A elbow support. Had pt complete wrist, hand, and elbow exercises as well. Given current status, recommend pt return to SNF to continue BADL progression. Will continue to follow per POC listed below.     Follow Up Recommendations  SNF    Equipment Recommendations  None recommended by OT    Recommendations for Other Services       Precautions / Restrictions Precautions Precautions: Fall Precaution Comments: ostomy Required Braces or Orthoses: Sling Restrictions Weight Bearing Restrictions: Yes RUE Weight Bearing: Weight bearing as tolerated      Mobility Bed Mobility Overal bed mobility: Needs Assistance Bed Mobility: Supine to Sit;Sit to Supine     Supine to sit: Max assist Sit to supine: Max assist   General bed mobility comments: max A with limited tolerance this date, very fatigued. Pt  repositioned back in bed  Transfers                      Balance Overall balance assessment: Needs assistance Sitting-balance support: Single extremity supported;Feet supported Sitting balance-Leahy Scale: Poor                                     ADL either performed or assessed with clinical judgement   ADL Overall ADL's : Needs assistance/impaired Eating/Feeding: NPO Eating/Feeding Details (indicate cue type and reason): can have small amount of ice chips. Needs mod A to manipulate cup and utensil Grooming: Moderate assistance;Sitting   Upper Body Bathing: Maximal assistance;Sitting   Lower Body Bathing: Maximal assistance;Total assistance;Sitting/lateral leans;Cueing for compensatory techniques   Upper Body Dressing : Maximal assistance;Sitting   Lower Body Dressing: Maximal assistance;Sitting/lateral leans;Sit to/from stand   Toilet Transfer: Maximal assistance;+2 for physical assistance   Toileting- Clothing Manipulation and Hygiene: Maximal assistance;Total assistance               Vision Baseline Vision/History: Wears glasses Wears Glasses: At all times Patient Visual Report: No change from baseline       Perception     Praxis      Pertinent Vitals/Pain Pain Assessment: Faces Faces Pain Scale: Hurts little more Pain Location: back and abdomen Pain Descriptors / Indicators: Grimacing Pain Intervention(s): Monitored during session;Repositioned     Hand Dominance Right   Extremity/Trunk Assessment Upper Extremity Assessment Upper Extremity  Assessment: RUE deficits/detail RUE Deficits / Details: RUE edmatous; pt able to move digits WFL. Pt not able to complete functional hand to mouth pattern at this time without min-mod A for elbow support. Overall difficulty with AAROM due to diffuse weakness RUE Coordination: decreased fine motor;decreased gross motor   Lower Extremity Assessment Lower Extremity Assessment: Defer to PT  evaluation       Communication Communication Communication: HOH   Cognition Arousal/Alertness: Awake/alert Behavior During Therapy: Flat affect Overall Cognitive Status: Impaired/Different from baseline Area of Impairment: Attention;Memory;Following commands;Safety/judgement;Awareness;Problem solving                   Current Attention Level: Focused Memory: Decreased recall of precautions;Decreased short-term memory Following Commands: Follows one step commands with increased time Safety/Judgement: Decreased awareness of safety;Decreased awareness of deficits Awareness: Intellectual Problem Solving: Slow processing;Requires verbal cues General Comments: increased time and cues needed for basic tasks   General Comments       Exercises Other Exercises Other Exercises: open/close hand; extend/flex elbow x5   Shoulder Instructions      Home Living Family/patient expects to be discharged to:: Skilled nursing facility Living Arrangements: Alone Available Help at Discharge: Family;Available PRN/intermittently;Friend(s) Type of Home: Apartment Home Access: Stairs to enter Entrance Stairs-Number of Steps: 1 Entrance Stairs-Rails: None Home Layout: One level     Bathroom Shower/Tub: Teacher, early years/pre: Standard Bathroom Accessibility: Yes   Home Equipment: Environmental consultant - 2 wheels;Cane - single point;Shower seat   Additional Comments: home setup obtained from prior admission, pt with difficulty reporting history at this time      Prior Functioning/Environment Level of Independence: Independent        Comments: ADLs, IADLs, and driving; at times nephew will do grocery shopping for patient. But prior to recent admission, pt was at [facility]. Reports he did little with therapy and had help of lift to get out of bed        OT Problem List: Decreased strength;Decreased range of motion;Decreased activity tolerance;Impaired balance (sitting and/or  standing);Decreased safety awareness;Decreased knowledge of use of DME or AE;Decreased knowledge of precautions;Pain;Impaired UE functional use;Decreased cognition      OT Treatment/Interventions: Self-care/ADL training;Therapeutic exercise;Energy conservation;DME and/or AE instruction;Therapeutic activities;Patient/family education;Balance training    OT Goals(Current goals can be found in the care plan section) Acute Rehab OT Goals Patient Stated Goal: To improve mobility and reduce pain OT Goal Formulation: With patient Time For Goal Achievement: 09/13/19 Potential to Achieve Goals: Good  OT Frequency: Min 2X/week   Barriers to D/C:            Co-evaluation              AM-PAC OT "6 Clicks" Daily Activity     Outcome Measure Help from another person eating meals?: A Lot Help from another person taking care of personal grooming?: A Lot Help from another person toileting, which includes using toliet, bedpan, or urinal?: Total Help from another person bathing (including washing, rinsing, drying)?: Total Help from another person to put on and taking off regular upper body clothing?: A Lot Help from another person to put on and taking off regular lower body clothing?: Total 6 Click Score: 9   End of Session Nurse Communication: Mobility status;Weight bearing status;Precautions  Activity Tolerance: Patient tolerated treatment well Patient left: in bed;with call bell/phone within reach  OT Visit Diagnosis: Unsteadiness on feet (R26.81);Other abnormalities of gait and mobility (R26.89);Muscle weakness (generalized) (M62.81);Pain  Time: 9390-3009 OT Time Calculation (min): 18 min Charges:  OT General Charges $OT Visit: 1 Visit OT Evaluation $OT Eval Moderate Complexity: [place], MSOT, OTR/L Acute Rehabilitation Services [facility] Office Number: [phone] Pager: [phone]  Zenovia Jarred 08/30/2019, 6:07 PM

## 2019-08-30 NOTE — Progress Notes (Signed)
ANTICOAGULATION CONSULT NOTE - Initial Consult  Pharmacy Consult:  Heparin Indication:  History of Afib/DVT and acute LV thrombus  No Known Allergies  Patient Measurements: Height: 6\' 2"  (188 cm) Weight: 97 kg (213 lb 13.5 oz) IBW/kg (Calculated) : 82.2 Heparin Dosing Weight: 97 kg  Vital Signs: Temp: 97.7 F (36.5 C) (06/16 2000) Temp Source: Axillary (06/16 2000) BP: 120/46 (06/16 2000) Pulse Rate: 77 (06/16 2000)  Labs: Recent Labs    08/15/2019 1740 08/29/19 0044 08/29/19 0549 08/29/19 0549 08/29/19 0941 08/29/19 1555 08/29/19 2058 08/30/19 0512 08/30/19 1322  HGB  --    < > 8.5*   < >  --  9.6*  --  9.4*  --   HCT  --    < > 25.9*  --   --  29.3*  --  28.9*  --   PLT  --   --  296  --   --  367  --  403*  --   APTT 60*  --   --   --  22*  --  52*  --   --   LABPROT 19.9*  --   --   --   --   --   --   --   --   INR 1.8*  --   --   --   --   --   --   --   --   CREATININE 2.52*  --  2.48*  --   --  2.94*  --  3.43*  --   TROPONINIHS  --   --   --   --   --   --   --   --  67*   < > = values in this interval not displayed.    Estimated Creatinine Clearance: 22 mL/min (A) (by C-G formula based on SCr of 3.43 mg/dL (H)).   Medical History: Past Medical History:  Diagnosis Date  . CAD (coronary artery disease)   . DM (diabetes mellitus) ([place])    type 2   . DVT (deep venous thrombosis) ([place])   . Hyperlipidemia   . Hypertension   . ICD (implantable cardioverter-defibrillator), single, in situ   . Ischemic cardiomyopathy   . PAF (paroxysmal atrial fibrillation) ([place])   . RBBB   . S/P CABG x 5     Assessment: 60 YOM presented with bowel obstruction and received Praxbind to reverse Pradaxa for surgery.  He was started on prophylactic Lovenox post-op and his last dose was today around 1000.  Pharmacy consulted to start IV heparin for history of Afib/DVT and new LV apical thrombus on ECHO.  Renal function is worsening; hemoglobin is improving and platelet count  is elevated.  No overt bleeding documented.  Will be cautious due to risk of bleeding.  Goal of Therapy:  Heparin level 0.3-0.7 units/ml Monitor platelets by anticoagulation protocol: Yes   Plan:  D/C Lovenox Start heparin gtt at 1200 units/hr, no bolus per Cards Check 8 hr heparin level Daily heparin level and CBC  Jaine Estabrooks D. Mina Marble, PharmD, BCPS, [place] 08/30/2019, 10:20 PM

## 2019-08-30 NOTE — Evaluation (Signed)
Physical Therapy Evaluation Patient Details Name: Alan Orr MRN: [id number] DOB: [birth year] Today's Date: 08/30/2019   History of Present Illness  74 year old male with PMH as below, which is significant for CAD status post CABG, afib on Pradaxa, VT s/p AICD, CKD 3. He lives alone and is fairly independent per family. Recently admitted to [facility] after fall for R humerus fracture. There was concern for polypharmacy and for inability to self care so he was discharged to SNF. Pt presents to [facility] on 6/14 with abdominal distension and pain found to have acute abdomen. Pt underwent R hemicolectomy with end ileostomy. Pt with hypotension post-procedure transferred to ICU.  Clinical Impression  Pt presents to PT with deficits in functional mobility, balance, strength, power, endurance, cognition. Pt is generally weak, requiring significant assistance to mobilize to edge of bed. Pt's activity tolerance is limited by back pain during sitting this session, with pt refusing attempts to stand due to this pain. Pt with increased difficulty mobilizing to the edge of bed due to WB status of RUE, also in sling. Pt will continue to benefit from acute PT POC to improve mobility quality and activity tolerance. PT currently recommends a return to SNF at time of discharge for further inpatient PT services.    Follow Up Recommendations SNF;Supervision/Assistance - 24 hour    Equipment Recommendations  Wheelchair (measurements PT);Wheelchair cushion (measurements PT);Hospital bed (mechanical lift, if home today)    Recommendations for Other Services       Precautions / Restrictions Precautions Precautions: Fall Precaution Comments: ostomy Required Braces or Orthoses: Sling Restrictions Weight Bearing Restrictions: Yes RUE Weight Bearing: Non weight bearing      Mobility  Bed Mobility Overal bed mobility: Needs Assistance Bed Mobility: Supine to Sit;Sit to Supine     Supine to sit: Max  assist Sit to supine: Max assist      Transfers                 General transfer comment: pt declines attempts at transfers 2/2 back pain  Ambulation/Gait                Stairs            Wheelchair Mobility    Modified Rankin (Stroke Patients Only)       Balance Overall balance assessment: Needs assistance Sitting-balance support: Single extremity supported;Feet supported Sitting balance-Leahy Scale: Poor Sitting balance - Comments: minA to maintain static sitting balance at edge of bed                                     Pertinent Vitals/Pain Pain Assessment: Faces Faces Pain Scale: Hurts whole lot Pain Location: back and abdomen Pain Descriptors / Indicators: Grimacing Pain Intervention(s): Monitored during session;Patient requesting pain meds-RN notified    Home Living Family/patient expects to be discharged to:: Skilled nursing facility Living Arrangements: Alone Available Help at Discharge: Family;Available PRN/intermittently;Friend(s) Type of Home: Apartment Home Access: Stairs to enter Entrance Stairs-Rails: None Entrance Stairs-Number of Steps: 1 Home Layout: One level Home Equipment: Walker - 2 wheels;Cane - single point;Shower seat Additional Comments: home setup obtained from prior admission, pt with difficulty reporting history at this time    Prior Function Level of Independence: Independent         Comments: ADLs, IADLs, and driving; at times nephew will do grocery shopping for patient     Hand Dominance  Dominant Hand: Right    Extremity/Trunk Assessment   Upper Extremity Assessment Upper Extremity Assessment: RUE deficits/detail RUE Deficits / Details: RUE edematous, PT attempts to adjust sling but finding it difficult to obtain good fit due to assistance needs with sitting at edge of bed    Lower Extremity Assessment Lower Extremity Assessment: Generalized weakness    Cervical / Trunk  Assessment Cervical / Trunk Assessment: Normal  Communication   Communication: HOH  Cognition Arousal/Alertness: Awake/alert Behavior During Therapy: Flat affect Overall Cognitive Status: Impaired/Different from baseline Area of Impairment: Attention;Memory;Following commands;Safety/judgement;Awareness;Problem solving                   Current Attention Level: Focused Memory: Decreased recall of precautions;Decreased short-term memory Following Commands: Follows one step commands with increased time Safety/Judgement: Decreased awareness of safety;Decreased awareness of deficits Awareness: Intellectual Problem Solving: Slow processing;Requires verbal cues        General Comments General comments (skin integrity, edema, etc.): VSS on 2L [place], BP 132/52 pre-mobility, 125/55 in sitting, and 112/62 upon return to supine    Exercises     Assessment/Plan    PT Assessment Patient needs continued PT services  PT Problem List Decreased strength;Decreased activity tolerance;Decreased balance;Decreased mobility;Decreased cognition;Decreased knowledge of use of DME;Decreased safety awareness;Decreased knowledge of precautions;Cardiopulmonary status limiting activity;Pain       PT Treatment Interventions DME instruction;Gait training;Stair training;Functional mobility training;Therapeutic activities;Therapeutic exercise;Balance training;Neuromuscular re-education;Cognitive remediation;Patient/family education    PT Goals (Current goals can be found in the Care Plan section)  Acute Rehab PT Goals Patient Stated Goal: To improve mobility and reduce pain PT Goal Formulation: With patient Time For Goal Achievement: 09/13/19 Potential to Achieve Goals: Good    Frequency Min 3X/week   Barriers to discharge        Co-evaluation               AM-PAC PT "6 Clicks" Mobility  Outcome Measure Help needed turning from your back to your side while in a flat bed without using  bedrails?: Total Help needed moving from lying on your back to sitting on the side of a flat bed without using bedrails?: Total Help needed moving to and from a bed to a chair (including a wheelchair)?: Total Help needed standing up from a chair using your arms (e.g., wheelchair or bedside chair)?: Total Help needed to walk in hospital room?: Total Help needed climbing 3-5 steps with a railing? : Total 6 Click Score: 6    End of Session   Activity Tolerance: Patient limited by pain Patient left: in bed;with call bell/phone within reach;with bed alarm set Nurse Communication: Mobility status;Need for lift equipment PT Visit Diagnosis: Other abnormalities of gait and mobility (R26.89);Muscle weakness (generalized) (M62.81);History of falling (Z91.81)    Time: 7425-9563 PT Time Calculation (min) (ACUTE ONLY): 30 min   Charges:   PT Evaluation $PT Eval Moderate Complexity: 1 Mod PT Treatments $Therapeutic Activity: 8-22 mins        Zenaida Niece, PT, DPT Acute Rehabilitation Pager: [phone]   Zenaida Niece 08/30/2019, 10:41 AM

## 2019-08-30 NOTE — Plan of Care (Signed)

## 2019-08-30 NOTE — Consult Note (Signed)
[place] Nurse ostomy follow up Patient receiving care in [place]. Stoma type/location: RUQ ileostomy Stomal assessment/size: 1 1/8 inch, budded, red, moist, all sutures intact, no apparent tension on sutures. Peristomal assessment: normal, no breakdown, no erythema Treatment options for stomal/peristomal skin: IF needed, barrier rings are in in room Output: just a few drops of serosanginous in existing pouch, no flatus  Ostomy pouching: 2pc. 2 3/4 inch, cut to fit. Education provided: none.  Patient unable to focus, very weak, lives in a SNF, wishes to go back to a SNF at discharge Enrolled patient in [place] Start Discharge program: No Additional supplies in room. Val Riles, RN, MSN, CWOCN, CNS-BC, pager [phone]

## 2019-08-30 NOTE — Progress Notes (Signed)
PHARMACY - TOTAL PARENTERAL NUTRITION CONSULT NOTE   Indication: Prolonged ileus  Patient Measurements: Height: 6\' 2"  (188 cm) Weight: 97 kg (213 lb 13.5 oz) IBW/kg (Calculated) : 82.2 TPN AdjBW (KG): 96 Body mass index is 27.46 kg/m.  Assessment: 68 YOM who presents with abdominal pain, N/V found to have perforated colon and feculent peritonitis now s/p R hemicolectomy with end ileostomy. Given anticipated prolonged ileus with poor baseline nutritional status indicated by a prealbumin < 5, pharmacy consulted to start TPN while allowing for bowel rest.   Glucose / Insulin: A1c 6, CBGs 180-220 since TPN started, SSI: 9 units/12h. On sensitive SSI  Electrolytes: K 4.2, Mg 2.5, CO2 22, corrCa 9.1, Phos 3.5 Renal: AKI with SCr up to 3.43 << 2.94 (BL 1-1.3), UOP 0.1 ml/kg/hr LFTs / TGs: LFTs elevated 220/115, Tbili wnl Prealbumin / albumin: Pre-albumin < 5, albumin 1.8 Intake / Output; MIVF: UOP 0.1 ml/kg/hr, NGT output/24h: ~200cc GI Imaging: - 6/14: Free air in the abdomen consistent with bowel perforation ID: on Zosyn for intra-abdominal infection  Surgeries / Procedures:  - 6/15: R hemicolectomy with end ileostomy   Central access: DL CVC TPN start date: 6/15 >>   Nutritional Goals : RD recommendations still pending  Per Rx calculations: 2900-3400 kcal, ~150g protein/day Estimated goal rate of 115 ml/hr will provide 151g protein, 441g CHO, 88g lipids providing 2992 kcal  Current Nutrition:  NPO  Plan:  Increase TPN to 80 ml/hr to provide 105g protein, 307g CHO, 61g lipid and ~2080 kcal to meet ~70% of patient's estimated needs Electrolytes in TPN: 68mEq/L of Na, 70mEq/L of K, 42mEq/L of Ca, reduce to 28mEq/L of Mg, and 27mmol/L of Phos. adjust Cl:Ac ratio 1:2 Add standard MVI and trace elements to TPN Add 10 units of insulin R to TPN bag Continue Sensitive q4h SSI and adjust as needed  Monitor TPN labs on Mon/Thurs   Thank you for allowing pharmacy to be a part of this  patient's care.  Alycia Rossetti, PharmD, BCPS Clinical Pharmacist 08/30/2019 8:34 AM   **Pharmacist phone directory can now be found on [email or web address] ([email or web address]).  Listed under [place].

## 2019-08-30 NOTE — Progress Notes (Addendum)
   08/29/19 1512  Assess: MEWS Score  Temp (!) 96.6 F (35.9 C)  BP (!) 83/29  Pulse Rate 66  ECG Heart Rate 67  Resp 13  SpO2 99 %  O2 Device Nasal Cannula  O2 Flow Rate (L/min) 3 L/min  Assess: MEWS Score  MEWS Temp 1  MEWS Systolic 1  MEWS Pulse 0  MEWS RR 1  MEWS LOC 1  MEWS Score 4  MEWS Score Color Red  Assess: if the MEWS score is Yellow or Red  Were vital signs taken at a resting state? Yes  Focused Assessment Documented focused assessment  Early Detection of Sepsis Score *See Row Information* Low  MEWS guidelines implemented *See Row Information* Yes  Treat  MEWS Interventions Administered prn meds/treatments  Take Vital Signs  Increase Vital Sign Frequency  Red: Q 1hr X 4 then Q 4hr X 4, if remains red, continue Q 4hrs  Escalate  MEWS: Escalate Red: discuss with charge nurse/RN and provider, consider discussing with RRT  Notify: Charge Nurse/RN  Name of Charge Nurse/RN Notified Simmie Davies, RN  Date Charge Nurse/RN Notified 08/29/19  Time Charge Nurse/RN Notified [place]  Notify: Provider  Provider Name/Title Georgann Housekeeper, NP  Date Provider Notified 08/29/19  Time Provider Notified 1512  Notification Type Face-to-face  Notification Reason Change in status  Response See new orders  Date of Provider Response 08/29/19  Time of Provider Response 1512  Notify: Rapid Response  Name of Rapid Response RN Notified  (NP in room)  Document  Patient Outcome Transferred/level of care increased  Progress note created (see row info) Yes

## 2019-08-30 NOTE — Progress Notes (Signed)
NAME:  Alan Orr, MRN:  [id number], DOB:  1945/04/17, LOS: 2 ADMISSION DATE:  08/27/2019, CONSULTATION DATE: 6/15 REFERRING MD: Dr. Broadus John, CHIEF COMPLAINT: Shock  Brief History   74 year old male admitted with acute abdomen 6/14 taken emergently to the OR for right hemicolectomy with end ileostomy.  Postoperatively he was hypotensive despite IV fluid resuscitation and was transferred to ICU for further management.  History of present illness   74 year old male with past medical history as below, which is significant for coronary artery disease status post CABG, atrial fibrillation on Pradaxa, VT status post AICD, CKD 3. He lives alone and is fairly independent per family (still changes his own motor oil). Recently admitted to [facility] after fall for R humerus fracture. There was concern for polypharmacy and for inability to self care so he was discharged to SNF. He presented to [facility] emergency department on 6/14 with complaints of abdominal distention and tenderness x24 hours.  CT imaging of the abdomen demonstrated bowel perforation general surgery at any [facility] recommended transfer to [facility].  Upon arrival to [facility] he was taken emergently to the operating room by Dr. Kieth Brightly where he underwent right hemicolectomy with end ileostomy.  Fascia was closed in the OR soft tissue remained open and surgically packed and dressed.  The postoperative.  He developed progressive hypotension despite almost 5 L of IV fluids since the time of admission.  PCCM was consulted for possible pressor need.  Past Medical History   has a past medical history of CAD (coronary artery disease), DM (diabetes mellitus) ([place]), DVT (deep venous thrombosis) ([place]), Hyperlipidemia, Hypertension, ICD (implantable cardioverter-defibrillator), single, in situ, Ischemic cardiomyopathy, PAF (paroxysmal atrial fibrillation) ([place]), RBBB, and S/P CABG x 5.   Significant Hospital Events   6/14: Admit, 2 OR  for hemicolectomy  Consults:  General surgery  Procedures:  6/14: Right IJ double-lumen central line  Significant Diagnostic Tests:  CT abdomen, pelvis 6/14: Extensive free air in the abdomen consistent with bowel perforation.  Tiny bilateral pleural effusions.  Micro Data:  Blood 6/14 > NGTD  Antimicrobials:  Zosyn 6/14>   Interim history/subjective:  + 87 ml I/O, net + 5 L Afebrile on 2 L [place] 2 mcg/min of Levophed to maintain MAP > 65.  No bowel function overnight   Objective   Blood pressure (!) 128/103, pulse 84, temperature 98.3 F (36.8 C), temperature source Oral, resp. rate 20, height 6\' 2"  (1.88 m), weight 97 kg, SpO2 96 %.        Intake/Output Summary (Last 24 hours) at 08/30/2019 0931 Last data filed at 08/30/2019 0900 Gross per 24 hour  Intake 4196.48 ml  Output 346 ml  Net 3850.48 ml   Filed Weights   09/08/2019 1528 08/29/19 0355 08/29/19 0500  Weight: 96 kg 97 kg 97 kg    Examination: General: Ill-appearing elderly male, no acute distress HENT: Normocephalic, atraumatic, PERRL, no JVD Lungs: Symmetric expansion, no increased work of breathing noted. Clear to auscultation.  Cardiovascular: Regular rate and rhythm, no murmurs rubs gallop. Warm and well perfused.  + edema.  Abdomen: Midline surgical dressing clean dry and intact, fascia pink. No drainage.  Ileostomy stoma healthy appearing. Extremities: No acute deformity.  BLE pitting edema Neuro: Alert and oriented, no focal deficits. Normal sensory  Resolved Hospital Problem list     Assessment & Plan:   Septic shock secondary to peritonitis in the setting of bowel perforation:  -Levophed.  MAP goal greater than 65  mmHg -Empiric abx with Zosyn. High risk for post op abscess  -Adequately fluid resuscitated, would hold further IVF  S/p right hemicolectomy with end ileostomy  Bowel perforation: As above -Management per general surgery -N.p.o. -NGT to LIWS  Acute kidney injury CKD 3 baseline  creatinine 1.3 -Likely component of ATN -Creatinine rising to 3.43 this am -Appears volume resuscitated on exam and would hold further IVF -Continue supportive care with stabilizing hemodynamics and treatment of sepsis.  Hypokalemia, resolved Hypocalcemia: Corrected calcium 9.2  Atrial fibrillation: On Pradaxa at home Ventricular tachycardia, history: Status post AICD CAD s/p CABG x 5 -Continue to hold Pradaxa, ramipril, lopressor and PO amio. Resume Pradaxa when approved by Surgery  -Utilize PRN metoprolol if needed for Afib with RVR. Goal HR < 110  DM2, hyperglycemia -Continue CBG monitoring and increase SSI.   Humerus fracture -Status post fall 10 days prior to arrival -Maintain sling.  -Plans to follow-up outpatient with Dr. Aline Brochure in [place] -If inpatient course is prolonged consider orthopedic consult inpatient  Elevated LFT's.  -Tylenol discontinued this am -Likely component of shock liver contritubing -CT Scan 6/14 with normal liver parenchyma -Continue to trend.   Hypothyroidism -Start IV synthroid -Thyroid panel pending  Best practice:  Diet: N.p.o. Receiving TPN DVT prophylaxis: SCDs, lovenox GI prophylaxis: Pepcid Glucose control: CBG monitoring and SSI Mobility: Bedrest Code Status: Full code Family Communication: Nephew updated this am.  Disposition: ICU  Labs   CBC: Recent Labs  Lab 09/10/2019 1642 08/29/19 0044 08/29/19 0549 08/29/19 1555 08/30/19 0512  WBC 11.8*  --  8.1 12.0* 14.6*  NEUTROABS 10.7*  --  7.2  --  13.6*  HGB 12.8* 10.2* 8.5* 9.6* 9.4*  HCT 38.3* 30.0* 25.9* 29.3* 28.9*  MCV 107.0*  --  106.6* 108.1* 108.2*  PLT 352  --  296 367 403*    Basic Metabolic Panel: Recent Labs  Lab 09/09/2019 1740 08/29/19 0044 08/29/19 0549 08/29/19 1555 08/30/19 0512  NA 138 143 142 140 141  K 2.8* 3.3* 3.0* 4.2 4.2  CL 103  --  113* 111 110  CO2 21*  --  20* 21* 22  GLUCOSE 215*  --  174* 162* 220*  BUN 33*  --  29* 39* 48*    CREATININE 2.52*  --  2.48* 2.94* 3.43*  CALCIUM 7.8*  --  6.3* 7.1* 7.4*  MG  --   --  1.7  --  2.5*  PHOS  --   --   --   --  3.5   GFR: Estimated Creatinine Clearance: 22 mL/min (A) (by C-G formula based on SCr of 3.43 mg/dL (H)). Recent Labs  Lab 09/04/2019 1642 08/29/19 0549 08/29/19 1555 08/30/19 0512  WBC 11.8* 8.1 12.0* 14.6*  LATICACIDVEN  --  2.1* 2.0*  --     Liver Function Tests: Recent Labs  Lab 09/06/2019 1740 08/29/19 0549 08/30/19 0512  AST 116* 245* 220*  ALT 60* 91* 115*  ALKPHOS 96 60 73  BILITOT 1.1 1.2 0.9  PROT 5.7* 4.0* 4.6*  ALBUMIN 2.0* 1.9* 1.8*   Recent Labs  Lab 08/30/2019 1740  LIPASE 23   No results for input(s): AMMONIA in the last 168 hours.  ABG    Component Value Date/Time   PHART 7.368 08/29/2019 0044   PCO2ART 41.9 08/29/2019 0044   PO2ART 502 (H) 08/29/2019 0044   HCO3 25.1 08/29/2019 0044   TCO2 27 08/29/2019 0044   ACIDBASEDEF 1.0 08/29/2019 0044   O2SAT 100.0 08/29/2019 0044  Coagulation Profile: Recent Labs  Lab 08/19/2019 1740  INR 1.8*    Cardiac Enzymes: No results for input(s): CKTOTAL, CKMB, CKMBINDEX, TROPONINI in the last 168 hours.  HbA1C: Hemoglobin-A1c  Date/Time Value Ref Range Status  12/09/2006 01:50 PM (H) <6.0 % Final   8.6 (NOTE)  Therapeutic target for the treatment of diabetes mellitus patients is <7% HbA1c.  American Diabetes Association Diabetes Care 2002;25:S33-S49.   Hgb A1c MFr Bld  Date/Time Value Ref Range Status  08/18/2019 11:54 AM 6.0 (H) 4.8 - 5.6 % Final    Comment:    (NOTE) Pre diabetes:          5.7%-6.4% Diabetes:              >6.4% Glycemic control for   <7.0% adults with diabetes   06/21/2018 01:38 PM 5.9 (H) 4.8 - 5.6 % Final    Comment:    (NOTE) Pre diabetes:          5.7%-6.4% Diabetes:              >6.4% Glycemic control for   <7.0% adults with diabetes     CBG: Recent Labs  Lab 08/29/19 1540 08/29/19 2043 08/30/19 0006 08/30/19 0320 08/30/19 0805   GLUCAP 144* 185* 195* 217* 190*      Critical care time: 36 mins

## 2019-08-30 NOTE — Progress Notes (Signed)
[place] Progress Note Patient Name: Alan Orr DOB: 1945-04-14 MRN: [id number]   Date of Service  08/30/2019  HPI/Events of Note  There is a 2.8 x 1.4 cm mobile protruding thrombus in the LV apex. Cardiology recommends heparin IV infusion. Heparin is OK with surgery without bolus.   eICU Interventions  Plan: 1. Hepaarin IV infusion without bolus per pharmacy consult.      Intervention Category Major Interventions: Other:  [name] 08/30/2019, 10:28 PM

## 2019-08-31 DIAGNOSIS — N170 Acute kidney failure with tubular necrosis: Secondary | ICD-10-CM

## 2019-08-31 LAB — CBC
HCT: 28.5 % — ABNORMAL LOW (ref 39.0–52.0)
Hemoglobin: 9.1 g/dL — ABNORMAL LOW (ref 13.0–17.0)
MCH: 35.1 pg — ABNORMAL HIGH (ref 26.0–34.0)
MCHC: 31.9 g/dL (ref 30.0–36.0)
MCV: 110 fL — ABNORMAL HIGH (ref 80.0–100.0)
Platelets: 360 10*3/uL (ref 150–400)
RBC: 2.59 MIL/uL — ABNORMAL LOW (ref 4.22–5.81)
RDW: 16.9 % — ABNORMAL HIGH (ref 11.5–15.5)
WBC: 15.9 10*3/uL — ABNORMAL HIGH (ref 4.0–10.5)
nRBC: 0.6 % — ABNORMAL HIGH (ref 0.0–0.2)

## 2019-08-31 LAB — COMPREHENSIVE METABOLIC PANEL
ALT: 109 U/L — ABNORMAL HIGH (ref 0–44)
AST: 156 U/L — ABNORMAL HIGH (ref 15–41)
Albumin: 1.4 g/dL — ABNORMAL LOW (ref 3.5–5.0)
Alkaline Phosphatase: 111 U/L (ref 38–126)
Anion gap: 9 (ref 5–15)
BUN: 62 mg/dL — ABNORMAL HIGH (ref 8–23)
CO2: 21 mmol/L — ABNORMAL LOW (ref 22–32)
Calcium: 7.7 mg/dL — ABNORMAL LOW (ref 8.9–10.3)
Chloride: 109 mmol/L (ref 98–111)
Creatinine, Ser: 3.76 mg/dL — ABNORMAL HIGH (ref 0.61–1.24)
GFR calc Af Amer: 17 mL/min — ABNORMAL LOW (ref >60)
GFR calc non Af Amer: 15 mL/min — ABNORMAL LOW (ref >60)
Glucose, Bld: 181 mg/dL — ABNORMAL HIGH (ref 70–99)
Potassium: 4.6 mmol/L (ref 3.5–5.1)
Sodium: 139 mmol/L (ref 135–145)
Total Bilirubin: 0.6 mg/dL (ref 0.3–1.2)
Total Protein: 4.5 g/dL — ABNORMAL LOW (ref 6.5–8.1)

## 2019-08-31 LAB — GLUCOSE, CAPILLARY
Glucose-Capillary: 184 mg/dL — ABNORMAL HIGH (ref 70–99)
Glucose-Capillary: 141 mg/dL — ABNORMAL HIGH (ref 70–99)
Glucose-Capillary: 167 mg/dL — ABNORMAL HIGH (ref 70–99)
Glucose-Capillary: 149 mg/dL — ABNORMAL HIGH (ref 70–99)
Glucose-Capillary: 150 mg/dL — ABNORMAL HIGH (ref 70–99)

## 2019-08-31 LAB — HEPARIN LEVEL (UNFRACTIONATED)
Heparin Unfractionated: 0.21 IU/mL — ABNORMAL LOW (ref 0.30–0.70)
Heparin Unfractionated: 0.5 IU/mL (ref 0.30–0.70)

## 2019-08-31 LAB — PHOSPHORUS: Phosphorus: 4.1 mg/dL (ref 2.5–4.6)

## 2019-08-31 LAB — MAGNESIUM: Magnesium: 2.8 mg/dL — ABNORMAL HIGH (ref 1.7–2.4)

## 2019-08-31 MED ORDER — TRAVASOL 10 % IV SOLN
INTRAVENOUS | Status: AC
  Filled 2019-08-31: qty 1504.8

## 2019-08-31 MED ORDER — ALBUMIN HUMAN 25 % IV SOLN
12.5000 g | Freq: Once | INTRAVENOUS | Status: AC
  Administered 2019-08-31: 12.5 g via INTRAVENOUS
  Filled 2019-08-31: qty 50

## 2019-08-31 MED ORDER — FUROSEMIDE 10 MG/ML IJ SOLN
60.0000 mg | Freq: Once | INTRAMUSCULAR | Status: AC
  Administered 2019-08-31: 60 mg via INTRAVENOUS
  Filled 2019-08-31: qty 6

## 2019-08-31 NOTE — Progress Notes (Signed)
Physical Therapy Treatment Patient Details Name: Alan Orr MRN: [id number] DOB: [birth year] Today's Date: 08/31/2019    History of Present Illness 74 year old male with PMH as below, which is significant for CAD status post CABG, afib on Pradaxa, VT s/p AICD, CKD 3. He lives alone and is fairly independent per family. Recently admitted to [facility] after fall for R humerus fracture. There was concern for polypharmacy and for inability to self care so he was discharged to SNF. Pt presents to [facility] on 6/14 with abdominal distension and pain found to have acute abdomen. Pt underwent R hemicolectomy with end ileostomy. Pt with hypotension post-procedure transferred to ICU.    PT Comments    Pt limited by fatigue at this time, refusing bed mobility but agreeable to bed in chair mode for therapeutic exercise. Pt provides cues for UE and LE exercise, pt is generally weak with ROM limitations present in L shoulder and LLE. PT also encourages pt to perform R digit flexion/extension to aide in reduction of swelling in that hand and extremity. Pt tolerates bed in chair mode well with stable BP and is left in a semi-recliner bed in chair position upon PT departure. Pt will continue to benefit from acute PT POC to reduce falls risk and caregiver burden. PT continues to recommend SNF placement at the time of discharge.   Follow Up Recommendations  SNF;Supervision/Assistance - 24 hour     Equipment Recommendations  Wheelchair (measurements PT);Wheelchair cushion (measurements PT);Hospital bed    Recommendations for Other Services       Precautions / Restrictions Precautions Precautions: Fall Precaution Comments: ostomy Required Braces or Orthoses: Sling Restrictions Weight Bearing Restrictions: Yes RUE Weight Bearing: Non weight bearing    Mobility  Bed Mobility               General bed mobility comments: pt assisted to bed in chair mode for HEP  Transfers                     Ambulation/Gait                 Stairs             Wheelchair Mobility    Modified Rankin (Stroke Patients Only)       Balance                                            Cognition Arousal/Alertness: Awake/alert Behavior During Therapy: Flat affect Overall Cognitive Status: Impaired/Different from baseline Area of Impairment: Problem solving;Attention;Following commands;Awareness                   Current Attention Level: Focused Memory: Decreased recall of precautions;Decreased short-term memory Following Commands: Follows one step commands with increased time Safety/Judgement: Decreased awareness of safety;Decreased awareness of deficits Awareness: Intellectual Problem Solving: Slow processing        Exercises General Exercises - Upper Extremity Shoulder Flexion: AROM;Left;5 reps Shoulder Flexion Limitations: limited to 90 degrees Elbow Flexion: AROM;Left;10 reps Digit Composite Flexion: AROM;Right;10 reps Composite Extension: AROM;Right;10 reps General Exercises - Lower Extremity Ankle Circles/Pumps: AROM;Both;20 reps Gluteal Sets: AROM;Both;5 reps Short Arc Quad: AROM;Both;10 reps Hip ABduction/ADduction: AROM;Both;5 reps Other Exercises Other Exercises: hip internal/external rotation AROM 10 repetitions bilaterally    General Comments General comments (skin integrity, edema, etc.): VSS on 2L [place]  Pertinent Vitals/Pain Pain Assessment: Faces Faces Pain Scale: Hurts a little bit Pain Location: generalized Pain Descriptors / Indicators: Grimacing Pain Intervention(s): Monitored during session    Home Living                      Prior Function            PT Goals (current goals can now be found in the care plan section) Acute Rehab PT Goals Patient Stated Goal: To improve mobility and reduce pain Progress towards PT goals: Not progressing toward goals - comment (limited by fatigue)     Frequency    Min 3X/week      PT Plan Current plan remains appropriate    Co-evaluation              AM-PAC PT "6 Clicks" Mobility   Outcome Measure  Help needed turning from your back to your side while in a flat bed without using bedrails?: Total Help needed moving from lying on your back to sitting on the side of a flat bed without using bedrails?: Total Help needed moving to and from a bed to a chair (including a wheelchair)?: Total Help needed standing up from a chair using your arms (e.g., wheelchair or bedside chair)?: Total Help needed to walk in hospital room?: Total Help needed climbing 3-5 steps with a railing? : Total 6 Click Score: 6    End of Session Equipment Utilized During Treatment: Oxygen Activity Tolerance: Patient limited by fatigue Patient left: in bed;with call bell/phone within reach;with bed alarm set Nurse Communication: Mobility status;Need for lift equipment PT Visit Diagnosis: Other abnormalities of gait and mobility (R26.89);Muscle weakness (generalized) (M62.81);History of falling (Z91.81)     Time: 4709-6283 PT Time Calculation (min) (ACUTE ONLY): 17 min  Charges:  $Therapeutic Exercise: 8-22 mins                     Zenaida Niece, PT, DPT Acute Rehabilitation Pager: [phone]    Zenaida Niece 08/31/2019, 1:56 PM

## 2019-08-31 NOTE — Progress Notes (Signed)
E-link MD notified about poor urine output (130ml) over shift along with new arm weeping. CVP ordered and found to be WDL. Orders placed for albumin and lasix 30 min post albumin infusion. MD also notified of pink tinged urine after starting heparin gtt. Orders to continue to closely monitor.

## 2019-08-31 NOTE — Consult Note (Signed)
[facility] Renal Consultation Note  Requesting MD: Ramaswamy Indication for Consultation: AKI  HPI:  Alan Orr is a 74 y.o. male with a significant medical history to include diabetes mellitus, hypertension, coronary artery disease status post CABG, chronic atrial fibrillation and also an ICD in place.  He has had a failure to thrive recently with falls-a right humeral fracture recently placed in a nursing home.  Then, was brought in on 6/14 with abdominal pain.  Was found to have a bowel perforation with peritonitis requiring surgery-a right hemicolectomy with end ileostomy.  His recovery has been in the intensive care unit, transiently on pressors but now off.  Of note, he was on an ACE inhibitor prior to admission.  Seems that his baseline creatinine is around 1.3.  When presented with abdominal pain creatinine was 2.5.  Postoperatively creatinine has worsened to a level of 3.76 today.  Urine output had not been good but actually seems to be increasing some.  Total of 360 cc yesterday with around 300 in his Foley bag right now.  Fluid wise he is very positive and edematous but CVP in the single digits and on nasal cannula oxygen.  He is alert and complaining of arm and abdominal pain but denies nausea.  We are asked to see this patient for acute on chronic renal failure.  Labs of note other than the creatinine include a BUN of 62, bicarb of 21, potassium 4.6, albumin 1.4, hemoglobin 9.1.  Also of note, was found to have a left atrial thrombus and is now on a heparin drip  Creat  Date/Time Value Ref Range Status  09/20/2015 10:04 AM 1.42 (H) 0.70 - 1.18 mg/dL Final    Comment:      For patients > or = 75 years of age: The upper reference limit for Creatinine is approximately 13% higher for people identified as African-American.      Creatinine, Ser  Date/Time Value Ref Range Status  08/31/2019 04:03 AM 3.76 (H) 0.61 - 1.24 mg/dL Final  08/30/2019 05:12 AM 3.43 (H) 0.61  - 1.24 mg/dL Final  08/29/2019 03:55 PM 2.94 (H) 0.61 - 1.24 mg/dL Final  08/29/2019 05:49 AM 2.48 (H) 0.61 - 1.24 mg/dL Final  09/06/2019 05:40 PM 2.52 (H) 0.61 - 1.24 mg/dL Final  08/22/2019 04:59 AM 1.28 (H) 0.61 - 1.24 mg/dL Final  08/21/2019 05:16 AM 1.34 (H) 0.61 - 1.24 mg/dL Final  08/20/2019 04:23 AM 1.37 (H) 0.61 - 1.24 mg/dL Final  08/19/2019 06:41 AM 1.19 0.61 - 1.24 mg/dL Final  08/18/2019 11:54 AM 1.32 (H) 0.61 - 1.24 mg/dL Final  08/17/2019 08:04 PM 1.59 (H) 0.61 - 1.24 mg/dL Final  08/16/2019 04:42 AM 1.71 (H) 0.61 - 1.24 mg/dL Final  08/15/2019 04:50 PM 1.84 (H) 0.61 - 1.24 mg/dL Final  06/26/2019 09:30 AM 1.39 (H) 0.76 - 1.27 mg/dL Final  06/22/2018 05:51 AM 1.09 0.61 - 1.24 mg/dL Final  06/21/2018 01:38 PM 1.35 (H) 0.61 - 1.24 mg/dL Final  12/25/2017 08:36 AM 1.16 0.61 - 1.24 mg/dL Final  10/22/2016 06:26 AM 1.02 0.61 - 1.24 mg/dL Final  10/21/2016 10:19 AM 1.08 0.61 - 1.24 mg/dL Final  10/20/2016 06:18 AM 1.05 0.61 - 1.24 mg/dL Final  10/19/2016 06:00 AM 1.10 0.61 - 1.24 mg/dL Final  10/18/2016 06:23 AM 1.08 0.61 - 1.24 mg/dL Final  10/17/2016 06:25 PM 1.31 (H) 0.61 - 1.24 mg/dL Final  08/24/2016 10:48 AM 1.21 0.61 - 1.24 mg/dL Final  08/23/2016 02:53 AM 1.76 (H) 0.61 -  1.24 mg/dL Final  08/22/2016 05:08 PM 2.25 (H) 0.61 - 1.24 mg/dL Final  06/21/2015 03:33 PM 1.47 (H) 0.76 - 1.27 mg/dL Final  03/06/2015 01:45 PM 1.46 (H) 0.61 - 1.24 mg/dL Final  03/03/2015 06:40 AM 1.58 (H) 0.61 - 1.24 mg/dL Final  01/08/2015 05:34 AM 1.36 (H) 0.61 - 1.24 mg/dL Final  01/07/2015 09:29 PM 1.49 (H) 0.61 - 1.24 mg/dL Final  01/05/2015 10:57 AM 1.61 (H) 0.61 - 1.24 mg/dL Final  07/31/2014 03:28 PM 1.33 (H) 0.61 - 1.24 mg/dL Final  11/03/2013 11:03 AM 1.42 (H) 0.50 - 1.35 mg/dL Final  11/07/2010 02:52 PM 1.06 0.50 - 1.35 mg/dL Final  08/13/2010 06:30 AM 1.24 0.40 - 1.50 mg/dL Final  08/12/2010 07:12 PM 1.18 0.40 - 1.50 mg/dL Final  07/24/2010 07:23 PM 1.07 0.40 - 1.50 mg/dL Final   07/18/2009 04:09 AM 1.75 (H) 0.40 - 1.50 mg/dL Final  07/17/2009 04:30 AM 1.62 (H) 0.40 - 1.50 mg/dL Final  07/16/2009 03:50 AM 1.50 0.40 - 1.50 mg/dL Final  07/15/2009 03:42 AM 1.26 0.40 - 1.50 mg/dL Final  07/14/2009 12:50 PM 1.14 0.40 - 1.50 mg/dL Final  07/14/2009 10:00 AM 1.20 0.40 - 1.50 mg/dL Final  07/14/2009 12:30 AM 1.37 0.40 - 1.50 mg/dL Final  07/02/2009 04:38 PM 1.12 0.40 - 1.50 mg/dL Final  05/13/2009 05:10 AM 1.06 0.40 - 1.50 mg/dL Final  05/11/2009 03:25 AM 1.06 0.40 - 1.50 mg/dL Final  05/09/2009 06:45 AM 0.95 0.40 - 1.50 mg/dL Final  05/07/2009 08:20 AM 0.98 0.40 - 1.50 mg/dL Final  05/07/2009 02:05 AM 1.13 0.40 - 1.50 mg/dL Final  12/14/2008 10:11 PM 0.8 0.40 - 1.50 mg/dL Final     PMHx:   Past Medical History:  Diagnosis Date  . CAD (coronary artery disease)   . DM (diabetes mellitus) ([place])    type 2   . DVT (deep venous thrombosis) ([place])   . Hyperlipidemia   . Hypertension   . ICD (implantable cardioverter-defibrillator), single, in situ   . Ischemic cardiomyopathy   . PAF (paroxysmal atrial fibrillation) ([place])   . RBBB   . S/P CABG x 5     Past Surgical History:  Procedure Laterality Date  . BACK SURGERY    . CARDIAC CATHETERIZATION  05/08/2009   small vessel disease  . CARDIAC DEFIBRILLATOR PLACEMENT  05/09/2009   [employer]  . CORONARY ARTERY BYPASS GRAFT  12/31/1995   LIMA to LAD,SVG to intermediate,SVG to CX,seq. svg to posterior descending and posterolateral RCA  . EP IMPLANTABLE DEVICE N/A 09/25/2015   Procedure: PPM/BIV PPM Generator Changeout;  Surgeon: Evans Lance, MD;  Location: [facility];  Service: Cardiovascular;  Laterality: N/A;  . I & D EXTREMITY Left 11/03/2013   Procedure: Left Leg Debride Ulcer, Apply Wound VAC and [place];  Surgeon: Newt Minion, MD;  Location: [facility];  Service: Orthopedics;  Laterality: Left;  . ILEOSTOMY Right 09/02/2019   Procedure: ILEOSTOMY;  Surgeon: Kinsinger, Arta Bruce, MD;  Location: [facility];   Service: General;  Laterality: Right;  . LAPAROTOMY N/A 08/16/2019   Procedure: EXPLORATORY LAPAROTOMY,;  Surgeon: Kieth Brightly Arta Bruce, MD;  Location: [facility];  Service: General;  Laterality: N/A;  . PARTIAL COLECTOMY Right 09/01/2019   Procedure: RIGHT COLECTOMY;  Surgeon: Kieth Brightly Arta Bruce, MD;  Location: [facility];  Service: General;  Laterality: Right;  . TEE WITHOUT CARDIOVERSION N/A 01/11/2015   Procedure: TRANSESOPHAGEAL ECHOCARDIOGRAM (TEE);  Surgeon: Herminio Commons, MD;  [facility];  Service:  Cardiology;  Laterality: N/A;    Family Hx:  Family History  Problem Relation Age of Onset  . Heart attack Mother   . Stroke Father     Social History:  reports that he quit smoking about 23 years ago. His smoking use included cigarettes. He has a 52.50 pack-year smoking history. He has never used smokeless tobacco. He reports current alcohol use. He reports that he does not use drugs.  Allergies: No Known Allergies  Medications: Prior to Admission medications   Medication Sig Start Date End Date Taking? Authorizing Provider  acetaminophen (TYLENOL) 325 MG tablet Take 650 mg by mouth every 6 (six) hours as needed for mild pain or moderate pain.   Yes [provider]  ALPRAZolam Duanne Moron) 1 MG tablet Take 1 tablet (1 mg total) by mouth 2 (two) times daily as needed for anxiety or sleep. 08/22/19  Yes Shah, Pratik D, DO  amiodarone (PACERONE) 200 MG tablet Take 200 mg by mouth daily.   Yes [provider]  aspirin EC 81 MG tablet Take 81 mg by mouth at bedtime.   Yes [provider]  atorvastatin (LIPITOR) 40 MG tablet Take 40 mg by mouth at bedtime.   Yes [provider]  dabigatran (PRADAXA) 75 MG CAPS capsule Take 1 capsule (75 mg total) by mouth every 12 (twelve) hours. 08/16/19  Yes Danford, Suann Larry, MD  ezetimibe (ZETIA) 10 MG tablet TAKE 1 TABLET ONCE DAILY FOR CHOLESTEROL. Patient taking differently: Take 10 mg by mouth daily.   01/04/17  Yes Lorretta Harp, MD  ferrous sulfate 325 (65 FE) MG tablet Take 325 mg by mouth daily with breakfast.   Yes [provider]  loperamide (IMODIUM) 2 MG capsule Take 1 capsule (2 mg total) by mouth as needed for diarrhea or loose stools. 08/22/19  Yes Shah, Pratik D, DO  magnesium oxide (MAG-OX) 400 MG tablet Take 400 mg by mouth daily.   Yes [provider]  metoprolol tartrate (LOPRESSOR) 50 MG tablet TAKE 1 TABLET BY MOUTH TWICE DAILY. Patient taking differently: Take 50 mg by mouth 2 (two) times daily.  10/10/18  Yes Evans Lance, MD  Multiple Vitamin (MULTI-VITAMINS) TABS Take 1 tablet by mouth daily.   Yes [provider]  niacin (NIASPAN) 1000 MG CR tablet TAKE (1) TABLET BY MOUTH AT BEDTIME. Patient taking differently: Take 1,000 mg by mouth at bedtime.  10/15/16  Yes Bayard Hugger, MD  nitroGLYCERIN (NITROSTAT) 0.4 MG SL tablet Place 0.4 mg under the tongue every 5 (five) minutes x 3 doses as needed for chest pain. 10/31/15  Yes [provider]  oxyCODONE 10 MG TABS Take 1 tablet (10 mg total) by mouth every 6 (six) hours as needed for severe pain or breakthrough pain (for pain). 08/22/19  Yes Shah, Pratik D, DO  ramipril (ALTACE) 2.5 MG capsule Take 2.5 mg by mouth daily.   Yes [provider]  SYNTHROID 137 MCG tablet Take 137 mcg by mouth daily before breakfast.  07/20/16  Yes [provider]    I have reviewed the patient's current medications.  Labs:  Results for orders placed or performed during the hospital encounter of 09/06/2019 (from the past 48 hour(s))  Glucose, capillary     Status: Abnormal   Collection Time: 08/29/19  3:40 PM  Result Value Ref Range   Glucose-Capillary 144 (H) 70 - 99 mg/dL    Comment: Glucose reference range applies only to samples taken after fasting  for at least 8 hours.   Comment 1 Notify RN    Comment 2 Document in Chart   Lactic acid, plasma     Status: Abnormal   Collection Time:  08/29/19  3:55 PM  Result Value Ref Range   Lactic Acid, Venous 2.0 (HH) 0.5 - 1.9 mmol/L    Comment: CRITICAL VALUE NOTED.  VALUE IS CONSISTENT WITH PREVIOUSLY REPORTED AND CALLED VALUE. Performed at [facility], [place] [address], [place], [place] [address]   Basic metabolic panel     Status: Abnormal   Collection Time: 08/29/19  3:55 PM  Result Value Ref Range   Sodium 140 135 - 145 mmol/L   Potassium 4.2 3.5 - 5.1 mmol/L    Comment: NO VISIBLE HEMOLYSIS   Chloride 111 98 - 111 mmol/L   CO2 21 (L) 22 - 32 mmol/L   Glucose, Bld 162 (H) 70 - 99 mg/dL    Comment: Glucose reference range applies only to samples taken after fasting for at least 8 hours.   BUN 39 (H) 8 - 23 mg/dL   Creatinine, Ser 2.94 (H) 0.61 - 1.24 mg/dL   Calcium 7.1 (L) 8.9 - 10.3 mg/dL   GFR calc non Af Amer 20 (L) >60 mL/min   GFR calc Af Amer 23 (L) >60 mL/min   Anion gap 8 5 - 15    Comment: Performed at [facility] [address], [place], [place] [address]  CBC     Status: Abnormal   Collection Time: 08/29/19  3:55 PM  Result Value Ref Range   WBC 12.0 (H) 4.0 - 10.5 K/uL    Comment: WHITE COUNT CONFIRMED ON SMEAR   RBC 2.71 (L) 4.22 - 5.81 MIL/uL   Hemoglobin 9.6 (L) 13.0 - 17.0 g/dL   HCT 29.3 (L) 39 - 52 %   MCV 108.1 (H) 80.0 - 100.0 fL   MCH 35.4 (H) 26.0 - 34.0 pg   MCHC 32.8 30.0 - 36.0 g/dL   RDW 16.4 (H) 11.5 - 15.5 %   Platelets 367 150 - 400 K/uL    Comment: PLATELET CLUMPS NOTED ON SMEAR, COUNT APPEARS ADEQUATE   nRBC 0.2 0.0 - 0.2 %    Comment: Performed at [facility], [place] [address], [place], [place] [address]  Glucose, capillary     Status: Abnormal   Collection Time: 08/29/19  8:43 PM  Result Value Ref Range   Glucose-Capillary 185 (H) 70 - 99 mg/dL    Comment: Glucose reference range applies only to samples taken after fasting for at least 8 hours.  APTT     Status: Abnormal   Collection Time: 08/29/19  8:58 PM  Result Value Ref Range   aPTT 52 (H) 24 - 36  seconds    Comment:        IF BASELINE aPTT IS ELEVATED, SUGGEST PATIENT RISK ASSESSMENT BE USED TO DETERMINE APPROPRIATE ANTICOAGULANT THERAPY. Performed at [facility], [place] [address] [place], [place] [address]   Glucose, capillary     Status: Abnormal   Collection Time: 08/30/19 12:06 AM  Result Value Ref Range   Glucose-Capillary 195 (H) 70 - 99 mg/dL    Comment: Glucose reference range applies only to samples taken after fasting for at least 8 hours.  Glucose, capillary     Status: Abnormal   Collection Time: 08/30/19  3:20 AM  Result Value Ref Range   Glucose-Capillary 217 (H) 70 - 99 mg/dL  Comment: Glucose reference range applies only to samples taken after fasting for at least 8 hours.  CBC     Status: Abnormal   Collection Time: 08/30/19  5:12 AM  Result Value Ref Range   WBC 14.6 (H) 4.0 - 10.5 K/uL   RBC 2.67 (L) 4.22 - 5.81 MIL/uL   Hemoglobin 9.4 (L) 13.0 - 17.0 g/dL   HCT 28.9 (L) 39 - 52 %   MCV 108.2 (H) 80.0 - 100.0 fL   MCH 35.2 (H) 26.0 - 34.0 pg   MCHC 32.5 30.0 - 36.0 g/dL   RDW 16.5 (H) 11.5 - 15.5 %   Platelets 403 (H) 150 - 400 K/uL   nRBC 0.3 (H) 0.0 - 0.2 %    Comment: Performed at [facility], [address], [place], [place] [address]  Comprehensive metabolic panel     Status: Abnormal   Collection Time: 08/30/19  5:12 AM  Result Value Ref Range   Sodium 141 135 - 145 mmol/L   Potassium 4.2 3.5 - 5.1 mmol/L   Chloride 110 98 - 111 mmol/L   CO2 22 22 - 32 mmol/L   Glucose, Bld 220 (H) 70 - 99 mg/dL    Comment: Glucose reference range applies only to samples taken after fasting for at least 8 hours.   BUN 48 (H) 8 - 23 mg/dL   Creatinine, Ser 3.43 (H) 0.61 - 1.24 mg/dL   Calcium 7.4 (L) 8.9 - 10.3 mg/dL   Total Protein 4.6 (L) 6.5 - 8.1 g/dL   Albumin 1.8 (L) 3.5 - 5.0 g/dL   AST 220 (H) 15 - 41 U/L   ALT 115 (H) 0 - 44 U/L   Alkaline Phosphatase 73 38 - 126 U/L   Total Bilirubin 0.9 0.3 - 1.2 mg/dL   GFR calc non Af Amer  17 (L) >60 mL/min   GFR calc Af Amer 19 (L) >60 mL/min   Anion gap 9 5 - 15    Comment: Performed at [facility] [address] [place], [place] [address]  Prealbumin     Status: Abnormal   Collection Time: 08/30/19  5:12 AM  Result Value Ref Range   Prealbumin <5 (L) 18 - 38 mg/dL    Comment: Performed at [facility] [address], [place], [place] [address]  Magnesium     Status: Abnormal   Collection Time: 08/30/19  5:12 AM  Result Value Ref Range   Magnesium 2.5 (H) 1.7 - 2.4 mg/dL    Comment: Performed at [facility] [address], [place], [place] [address]  Phosphorus     Status: None   Collection Time: 08/30/19  5:12 AM  Result Value Ref Range   Phosphorus 3.5 2.5 - 4.6 mg/dL    Comment: Performed at [facility] [address], [place], [place] [address]  Differential     Status: Abnormal   Collection Time: 08/30/19  5:12 AM  Result Value Ref Range   Neutrophils Relative % 93 %   Neutro Abs 13.6 (H) 1.7 - 7.7 K/uL   Lymphocytes Relative 4 %   Lymphs Abs 0.6 (L) 0.7 - 4.0 K/uL   Monocytes Relative 3 %   Monocytes Absolute 0.4 0 - 1 K/uL   Eosinophils Relative 0 %   Eosinophils Absolute 0.0 0 - 0 K/uL   Basophils Relative 0 %   Basophils Absolute 0.0 0 - 0 K/uL   nRBC 1 (  H) 0 /100 WBC   Abs Immature Granulocytes 0.00 0.00 - 0.07 K/uL   Acanthocytes PRESENT    Burr Cells PRESENT    Polychromasia PRESENT     Comment: Performed at [facility], [place] [address], [place], [place] [address]  Triglycerides     Status: None   Collection Time: 08/30/19  5:13 AM  Result Value Ref Range   Triglycerides 70 <150 mg/dL    Comment: Performed at [facility] [address], [place], [place] [address]  Glucose, capillary     Status: Abnormal   Collection Time: 08/30/19  8:05 AM  Result Value Ref Range   Glucose-Capillary 190 (H) 70 - 99 mg/dL    Comment: Glucose reference range applies only to samples taken after fasting for at least  8 hours.   Comment 1 Notify RN    Comment 2 Document in Chart   TSH     Status: None   Collection Time: 08/30/19 11:00 AM  Result Value Ref Range   TSH 1.579 0.350 - 4.500 uIU/mL    Comment: Performed by a 3rd Generation assay with a functional sensitivity of <=0.01 uIU/mL. Performed at [facility], [place] [address] [place],  [address]   T4, free     Status: Abnormal   Collection Time: 08/30/19 11:00 AM  Result Value Ref Range   Free T4 1.29 (H) 0.61 - 1.12 ng/dL    Comment: (NOTE) Biotin ingestion may interfere with free T4 tests. If the results are inconsistent with the TSH level, previous test results, or the clinical presentation, then consider biotin interference. If needed, order repeat testing after stopping biotin. Performed at [facility], [place] [address] [place] [address] [place], [place] [address]   Glucose, capillary     Status: Abnormal   Collection Time: 08/30/19 11:38 AM  Result Value Ref Range   Glucose-Capillary 167 (H) 70 - 99 mg/dL    Comment: Glucose reference range applies only to samples taken after fasting for at least 8 hours.   Comment 1 Notify RN    Comment 2 Document in Chart   Troponin I (High Sensitivity)     Status: Abnormal   Collection Time: 08/30/19  1:22 PM  Result Value Ref Range   Troponin I (High Sensitivity) 67 (H) <18 ng/L    Comment: (NOTE) Elevated high sensitivity troponin I (hsTnI) values and significant  changes across serial measurements may suggest ACS but many other  chronic and acute conditions are known to elevate hsTnI results.  Refer to the "Links" section for chest pain algorithms and additional  guidance. Performed at [facility], [place] [address] [place], [place] [address]   Glucose, capillary     Status: Abnormal   Collection Time: 08/30/19  3:46 PM  Result Value Ref Range   Glucose-Capillary 164 (H) 70 - 99 mg/dL    Comment: Glucose reference range applies only to samples taken after fasting for at least 8  hours.  Glucose, capillary     Status: Abnormal   Collection Time: 08/30/19  7:41 PM  Result Value Ref Range   Glucose-Capillary 206 (H) 70 - 99 mg/dL    Comment: Glucose reference range applies only to samples taken after fasting for at least 8 hours.  Glucose, capillary     Status: Abnormal   Collection Time: 08/30/19 11:37 PM  Result Value Ref Range   Glucose-Capillary 203 (H) 70 - 99 mg/dL    Comment: Glucose  reference range applies only to samples taken after fasting for at least 8 hours.  Glucose, capillary     Status: Abnormal   Collection Time: 08/31/19  3:23 AM  Result Value Ref Range   Glucose-Capillary 184 (H) 70 - 99 mg/dL    Comment: Glucose reference range applies only to samples taken after fasting for at least 8 hours.  Comprehensive metabolic panel     Status: Abnormal   Collection Time: 08/31/19  4:03 AM  Result Value Ref Range   Sodium 139 135 - 145 mmol/L   Potassium 4.6 3.5 - 5.1 mmol/L   Chloride 109 98 - 111 mmol/L   CO2 21 (L) 22 - 32 mmol/L   Glucose, Bld 181 (H) 70 - 99 mg/dL    Comment: Glucose reference range applies only to samples taken after fasting for at least 8 hours.   BUN 62 (H) 8 - 23 mg/dL   Creatinine, Ser 3.76 (H) 0.61 - 1.24 mg/dL   Calcium 7.7 (L) 8.9 - 10.3 mg/dL   Total Protein 4.5 (L) 6.5 - 8.1 g/dL   Albumin 1.4 (L) 3.5 - 5.0 g/dL   AST 156 (H) 15 - 41 U/L   ALT 109 (H) 0 - 44 U/L   Alkaline Phosphatase 111 38 - 126 U/L   Total Bilirubin 0.6 0.3 - 1.2 mg/dL   GFR calc non Af Amer 15 (L) >60 mL/min   GFR calc Af Amer 17 (L) >60 mL/min   Anion gap 9 5 - 15    Comment: Performed at [facility] [address], [place], [place] [address]  Magnesium     Status: Abnormal   Collection Time: 08/31/19  4:03 AM  Result Value Ref Range   Magnesium 2.8 (H) 1.7 - 2.4 mg/dL    Comment: Performed at [facility] [address], [place], [place] [address]  Phosphorus     Status: None   Collection Time: 08/31/19  4:03 AM  Result  Value Ref Range   Phosphorus 4.1 2.5 - 4.6 mg/dL    Comment: Performed at [facility] [address] [place], [place] [address]  CBC     Status: Abnormal   Collection Time: 08/31/19  4:03 AM  Result Value Ref Range   WBC 15.9 (H) 4.0 - 10.5 K/uL   RBC 2.59 (L) 4.22 - 5.81 MIL/uL   Hemoglobin 9.1 (L) 13.0 - 17.0 g/dL   HCT 28.5 (L) 39 - 52 %   MCV 110.0 (H) 80.0 - 100.0 fL   MCH 35.1 (H) 26.0 - 34.0 pg   MCHC 31.9 30.0 - 36.0 g/dL   RDW 16.9 (H) 11.5 - 15.5 %   Platelets 360 150 - 400 K/uL   nRBC 0.6 (H) 0.0 - 0.2 %    Comment: Performed at [facility], [place] [address] [place], [place] [address]  Heparin level (unfractionated)     Status: Abnormal   Collection Time: 08/31/19  7:32 AM  Result Value Ref Range   Heparin Unfractionated 0.21 (L) 0.30 - 0.70 IU/mL    Comment: (NOTE) If heparin results are below expected values, and patient dosage has  been confirmed, suggest follow up testing of antithrombin III levels. Performed at [facility], [place] [address] [place], [place] [address]   Glucose, capillary     Status: Abnormal   Collection Time: 08/31/19  8:09 AM  Result Value Ref Range   Glucose-Capillary 141 (H) 70 -  99 mg/dL    Comment: Glucose reference range applies only to samples taken after fasting for at least 8 hours.  Glucose, capillary     Status: Abnormal   Collection Time: 08/31/19 11:30 AM  Result Value Ref Range   Glucose-Capillary 167 (H) 70 - 99 mg/dL    Comment: Glucose reference range applies only to samples taken after fasting for at least 8 hours.     ROS:  A comprehensive review of systems was negative except for: Gastrointestinal: positive for abdominal pain also right arm pain and edema  Physical Exam: Vitals:   08/31/19 0800 08/31/19 1200  BP: (!) 118/46   Pulse: 77   Resp: (!) 22   Temp: 97.6 F (36.4 C) 98.8 F (37.1 C)  SpO2: 96%      General: White male, actually appears older than his stated age.  He is alert,  minimal distress but is having abdominal pain HEENT: Pupils are equal round react to light, extra motions are intact, mucous membranes are moist Neck: JVD is present Heart: Regular rate and rhythm Lungs: Decreased breath sounds at extreme bases Abdomen: Postoperative, minimal bowel sounds, slightly distended and tender Extremities: 2+ pitting edema throughout Skin: Warm and dry Neuro: Alert, nonfocal  Assessment/Plan: 74 year old white male with multiple medical issues.  Baseline creatinine 1.3 which does signify stage III CKD.  He now has acute on chronic renal failure in the setting of emergency abdominal surgery and hemodynamic instability 1.Renal-acute kidney injury on stage III CKD in the setting of emergency abdominal surgery and hemodynamic instability.  Urine output is down but does seem to be improving.  There are no absolute indications for renal replacement therapy at this time.  Patient was advised that dialysis support is a possibility however.  I am not sure patient would be a good long-term dialysis candidate but short-term support would seem reasonable.  We will continue to monitor kidney function and urine output for dialysis need 2. Hypertension/volume  -blood pressure is soft but now patient is off of pressors.  Was on ACE inhibitor preadmission but that is on hold.  He is 9 L positive and is edematous-likely third spacing secondary to decreased albumin.  Ins are being minimized right now.  For now would allow him to equilibrate and not necessarily use Lasix 3. Anemia  -due to surgery and acute kidney injury.  No intervention required at present-we will continue to follow 4.  Left atrial thrombus-on heparin drip 5.  Status post hemicolectomy-Per surgery-on Arville Lime 08/31/2019, 1:20 PM

## 2019-08-31 NOTE — Progress Notes (Signed)
ANTICOAGULATION CONSULT NOTE - Follow Up Consult  Pharmacy Consult:  Heparin Indication:  History of Afib/DVT and acute LV thrombus  No Known Allergies  Patient Measurements: Height: 6\' 2"  (188 cm) Weight: 105.3 kg (232 lb 2.3 oz) IBW/kg (Calculated) : 82.2 Heparin Dosing Weight: 97 kg  Vital Signs: Temp: 97.5 F (36.4 C) (06/17 1600) Temp Source: Oral (06/17 1600) BP: 124/79 (06/17 1700) Pulse Rate: 85 (06/17 1700)  Labs: Recent Labs    08/25/2019 1740 08/29/19 0044 08/29/19 0549 08/29/19 0941 08/29/19 1555 08/29/19 1555 08/29/19 2058 08/30/19 0512 08/30/19 1322 08/31/19 0403 08/31/19 0732 08/31/19 1700  HGB  --    < >   < >  --  9.6*   < >  --  9.4*  --  9.1*  --   --   HCT  --    < >   < >  --  29.3*  --   --  28.9*  --  28.5*  --   --   PLT  --    < >   < >  --  367  --   --  403*  --  360  --   --   APTT 60*  --   --  22*  --   --  52*  --   --   --   --   --   LABPROT 19.9*  --   --   --   --   --   --   --   --   --   --   --   INR 1.8*  --   --   --   --   --   --   --   --   --   --   --   HEPARINUNFRC  --   --   --   --   --   --   --   --   --   --  0.21* 0.50  CREATININE 2.52*   < >   < >  --  2.94*  --   --  3.43*  --  3.76*  --   --   TROPONINIHS  --   --   --   --   --   --   --   --  67*  --   --   --    < > = values in this interval not displayed.    Estimated Creatinine Clearance: 22.3 mL/min (A) (by C-G formula based on SCr of 3.76 mg/dL (H)).   Medical History: Past Medical History:  Diagnosis Date  . CAD (coronary artery disease)   . DM (diabetes mellitus) ([place])    type 2   . DVT (deep venous thrombosis) ([place])   . Hyperlipidemia   . Hypertension   . ICD (implantable cardioverter-defibrillator), single, in situ   . Ischemic cardiomyopathy   . PAF (paroxysmal atrial fibrillation) ([place])   . RBBB   . S/P CABG x 5     Assessment: 74 yr old man presented with bowel obstruction and received Praxbind to reverse Pradaxa for surgery (S/P R  hemicolectomy with end ileostomy on 6/15).  He was started on prophylactic Lovenox post-op (last dose was ~1000 on 6/16).  Pharmacy was consulted to start IV heparin (no bolus) for history of afib/DVT and new LV apical thrombus on ECHO.    Heparin level ~8 hrs after heparin infusion was increased to 1300 units/hr  was 0.50 units/ml, which is within the goal range for this pt. H/H, platelets stable. Per RN, no issues with IV or bleeding observed. Scr rising (up to 3.76 today) (AKI on CKD III).  Goal of Therapy:  Heparin level 0.3-0.7 units/ml Monitor platelets by anticoagulation protocol: Yes   Plan:  Continue heparin infusion at 1300 units/hr Check confirmatory heparin level in 8 hours Monitor daily heparin level and CBC Monitor for signs/symptoms of bleeding  Gillermina Hu, PharmD, BCPS, [facility] Clinical Pharmacist 08/31/2019       5:34 PM

## 2019-08-31 NOTE — Progress Notes (Signed)
PHARMACY - TOTAL PARENTERAL NUTRITION CONSULT NOTE   Indication: Prolonged ileus  Patient Measurements: Height: 6\' 2"  (188 cm) Weight: 105.3 kg (232 lb 2.3 oz) IBW/kg (Calculated) : 82.2 TPN AdjBW (KG): 96 Body mass index is 29.81 kg/m.  Assessment: 16 YOM who presents with abdominal pain, N/V found to have perforated colon and feculent peritonitis now s/p R hemicolectomy with end ileostomy. Given anticipated prolonged ileus with poor baseline nutritional status indicated by a prealbumin < 5, pharmacy consulted to start TPN while allowing for bowel rest.   Glucose / Insulin: A1c 6, CBGs 180-200 since TPN started, SSI: 18 units/12h. On sensitive SSI  Electrolytes: Na 139, K 4.6, Mg 2.8, CO2 21, corrCa 9.8, Phos 4.1 - will reduce K/Mg/Phos in TPN in the setting of AKI and uptrending levels Renal: AKI with SCr up to 3.76 << 3.43 (BL 1-1.3), UOP 0.1 ml/kg/hr LFTs / TGs: LFTs down to 156/109, Tbili wnl Prealbumin / albumin: Pre-albumin < 5, albumin 1.4 Intake / Output; MIVF: UOP 0.1 ml/kg/hr, NGT output/24h: ~120cc. LBM PTA GI Imaging: - 6/14: Free air in the abdomen consistent with bowel perforation ID: on Zosyn for intra-abdominal infection  Surgeries / Procedures:  - 6/15: R hemicolectomy with end ileostomy   Central access: DL CVC TPN start date: 6/15 >>   Nutritional Goals : Per RD recommendations on 6/16: 2400-2600 kcal, 125-150g protein, >2.4L/day  Estimated goal rate of 110 ml/hr will provide 150g protein, 356g CHO, 77g lipids providing 2579 kcal  Current Nutrition:  NPO  Plan:  Increase TPN to 110 ml/hr to provide 150g protein, 356g CHO, 77g lipid and ~2579 kcal to meet ~100% of patient's estimated needs Electrolytes in TPN: increase to 57mEq/L of Na, reduce to 52mEq/L of K, 63mEq/L of Ca, reduce to 93mEq/L of Mg, reduce to 76mmol/L of Phos, max acetate Add standard MVI and trace elements to TPN Increase to 20 units of insulin R to TPN bag Continue ICU SSI q4h and adjust as  needed - consider q6h interval? Monitor TPN labs on Mon/Thurs, BMET, Mg/Phos in AM  Thank you for allowing pharmacy to be a part of this patient's care.  Alycia Rossetti, PharmD, BCPS Clinical Pharmacist 08/31/2019 7:16 AM   **Pharmacist phone directory can now be found on [email or web address] ([email or web address]).  Listed under [place].

## 2019-08-31 NOTE — Progress Notes (Signed)
[place] Progress Note Patient Name: Alan Orr DOB: [birth year] MRN: [id number]   Date of Service  08/31/2019  HPI/Events of Note  Oliguria - Creatinine = 3.43 and Albumin = 1.8. CVP = 10-11.  eICU Interventions  Will order: 1. 25% Albumin 12.5 gm IV now.  2. Lasix 60 mg IV X 1 30 minutes after albumin finished.         Zeno Hickel Cornelia Copa 08/31/2019, 4:51 AM

## 2019-08-31 NOTE — Progress Notes (Signed)
NAME:  Alan Orr, MRN:  [id number], DOB:  Nov 06, 1945, LOS: 3 ADMISSION DATE:  09/02/2019, CONSULTATION DATE: 6/15 REFERRING MD: Dr. Broadus John, CHIEF COMPLAINT: Shock  Brief History   74 year old male admitted with acute abdomen 6/14 taken emergently to the OR for right hemicolectomy with end ileostomy.  Postoperatively he was hypotensive despite IV fluid resuscitation and was transferred to ICU for further management.  History of present illness   74 year old male with past medical history as below, which is significant for coronary artery disease status post CABG, atrial fibrillation on Pradaxa, VT status post AICD, CKD 3. He lives alone and is fairly independent per family (still changes his own motor oil). Recently admitted to [facility] after fall for R humerus fracture. There was concern for polypharmacy and for inability to self care so he was discharged to SNF. He presented to [facility] emergency department on 6/14 with complaints of abdominal distention and tenderness x24 hours.  CT imaging of the abdomen demonstrated bowel perforation general surgery at any [facility] recommended transfer to [facility].  Upon arrival to [facility] he was taken emergently to the operating room by Dr. Kieth Brightly where he underwent right hemicolectomy with end ileostomy.  Fascia was closed in the OR soft tissue remained open and surgically packed and dressed.  Postoperative he developed progressive hypotension despite almost 5 L of IV fluids since the time of admission.  PCCM was consulted for possible pressor need.  Past Medical History   has a past medical history of CAD (coronary artery disease), DM (diabetes mellitus) ([place]), DVT (deep venous thrombosis) ([place]), Hyperlipidemia, Hypertension, ICD (implantable cardioverter-defibrillator), single, in situ, Ischemic cardiomyopathy, PAF (paroxysmal atrial fibrillation) ([place]), RBBB, and S/P CABG x 5.   Significant Hospital Events   6/14: Admit, 2 OR for  hemicolectomy  Consults:  General surgery  Procedures:  6/14: Right IJ double-lumen central line  Significant Diagnostic Tests:  CT abdomen, pelvis 6/14> Extensive free air in the abdomen consistent with bowel perforation.  Tiny bilateral pleural effusions.  ECHO 6/16 > 1. There is a 2.8 x 1.4 cm mobile protruding thrombus in the LV apex.  Left ventricular ejection fraction, by estimation, is 60 to 65%. The left  ventricle has normal function. The left ventricle has no regional wall  motion abnormalities. Left ventricular  diastolic parameters are consistent with Grade I diastolic dysfunction  (impaired relaxation). There is akinesis of the left ventricular, entire  apical segment.  2. Right ventricular systolic function is normal. The right ventricular  size is normal. Tricuspid regurgitation signal is inadequate for assessing  PA pressure.  3. The mitral valve is normal in structure. No evidence of mitral valve  regurgitation. No evidence of mitral stenosis.  4. The aortic valve was not well visualized. Aortic valve regurgitation  is not visualized. No aortic stenosis is present.  5. The inferior vena cava is normal in size with <50% respiratory  variability, suggesting right atrial pressure of 8 mmHg.  6. Report of LV apical thrombus called to Dr. Sigmund Hazel Data:  Blood 6/14 > NGTD  Antimicrobials:  Zosyn 6/14>   Interim history/subjective:  Lying in bed in no acute distress, RN reports minimal urine output overnight and wheeping of upper extremities   Objective   Blood pressure (!) 118/46, pulse 77, temperature 97.6 F (36.4 C), temperature source Oral, resp. rate (!) 22, height 6\' 2"  (1.88 m), weight 105.3 kg, SpO2 96 %. CVP:  [11 mmHg] 11 mmHg  Intake/Output Summary (Last 24 hours) at 08/31/2019 0943 Last data filed at 08/31/2019 0827 Gross per 24 hour  Intake 4823.8 ml  Output 623 ml  Net 4200.8 ml   Filed Weights   08/29/19 0355 08/29/19  0500 08/31/19 0500  Weight: 97 kg 97 kg 105.3 kg    Examination: General: Chronically ill appearing deconditioned elderly male lying in bed, in NAD HEENT: [place]/AT, MM pink/moist, PERRL, NG tube in place   Neuro: Alert and oriented x3, non-focal CV: s1s2 regular rate and rhythm, no murmur, rubs, or gallops,  PULM:  Clear to ascultation bilaterally, no added breath sounds, no increased work of breathing  GI: soft, bowel sounds hypoactive in all 4 quadrants, tender to palpation, ostomy site clean no stool output, ABD dressing /D/I Extremities: warm/dry, 3+ pitting edema in upper and lower extremities, right arm in sling  Skin: no rashes or lesions  Resolved Hospital Problem list   Hypokalemia   Assessment & Plan:   Septic shock secondary to peritonitis in the setting of bowel perforation:  -Sepsis physiology has resolved  P: Pressor support stopped evening of 6/16 Continue Empiric antibiotics  Now appears volume overloaded, no further IVF at this time   S/p right hemicolectomy with end ileostomy  Bowel perforation: As above -CT imaging of the abdomen demonstrated bowel perforation on admission.  Transferre to [facility] where  he was taken emergently to the operating room by Dr. Kieth Brightly where he underwent right hemicolectomy with end ileostomy P: Management per General surgery  Remains NPO, diet per General surgery  NGT to LIWS Antibiotics as above  Acute kidney injury superimposed on CKD 3 -Baseline creatinine 1.3, creatinine currently 3.76 with limited urine output seen  -Likely component of ATN P: Consult nephrology today Follow renal function / urine output Trend Bmet Avoid nephrotoxins, ensure adequate renal perfusion   Mobile LV thrombus  -Echo showing 2.8 x 1.4 cm mobile protruding thrombus in LV apex. LVEF 60-65% with normal function. No LVH. Grade I diastolic dysfunction.  P: Cardiology consulted  Continue Heparin drip  Atrial fibrillation -On Pradaxa at  home Ventricular tachycardia -History: Status post AICD CAD s/p CABG x 5 P: Hom Pradaxa remains on hold, resume per General surgery  Home Ramipril, Lopressor, and Amiodarone on hold, resume when appropriate  PRN IV beta blocker  HR goal  < 110  DM2 P: SSI CBG q4hrs   Humerus fracture -Status post fall 10 days prior to arrival P: Maintain sling Will need to follow up with Dr. Adron Bene at discharge  If inpatient course prolonged my need ortho consult   Elevated LFT's, improving -Likely component of shock liver contributing, CT Scan 6/14 with normal liver parenchyma P: IV Tylenol stopped 6/16 Continue to trend   Hypothyroidism P: Continue IV synthroid    Best practice:  Diet: N.p.o. Receiving TPN DVT prophylaxis: SCDs, lovenox GI prophylaxis: Pepcid Glucose control: CBG monitoring and SSI Mobility: Bedrest Code Status: Full code Family Communication: Nephew updated this am.  Disposition: ICU  Labs   CBC: Recent Labs  Lab 09/12/2019 1642 08/31/2019 1642 08/29/19 0044 08/29/19 0549 08/29/19 1555 08/30/19 0512 08/31/19 0403  WBC 11.8*  --   --  8.1 12.0* 14.6* 15.9*  NEUTROABS 10.7*  --   --  7.2  --  13.6*  --   HGB 12.8*   < > 10.2* 8.5* 9.6* 9.4* 9.1*  HCT 38.3*   < > 30.0* 25.9* 29.3* 28.9* 28.5*  MCV 107.0*  --   --  106.6* 108.1* 108.2* 110.0*  PLT 352  --   --  296 367 403* 360   < > = values in this interval not displayed.    Basic Metabolic Panel: Recent Labs  Lab 08/31/2019 1740 09/12/2019 1740 08/29/19 0044 08/29/19 0549 08/29/19 1555 08/30/19 0512 08/31/19 0403  NA 138   < > 143 142 140 141 139  K 2.8*   < > 3.3* 3.0* 4.2 4.2 4.6  CL 103  --   --  113* 111 110 109  CO2 21*  --   --  20* 21* 22 21*  GLUCOSE 215*  --   --  174* 162* 220* 181*  BUN 33*  --   --  29* 39* 48* 62*  CREATININE 2.52*  --   --  2.48* 2.94* 3.43* 3.76*  CALCIUM 7.8*  --   --  6.3* 7.1* 7.4* 7.7*  MG  --   --   --  1.7  --  2.5* 2.8*  PHOS  --   --   --   --   --   3.5 4.1   < > = values in this interval not displayed.   GFR: Estimated Creatinine Clearance: 22.3 mL/min (A) (by C-G formula based on SCr of 3.76 mg/dL (H)). Recent Labs  Lab 08/29/19 0549 08/29/19 1555 08/30/19 0512 08/31/19 0403  WBC 8.1 12.0* 14.6* 15.9*  LATICACIDVEN 2.1* 2.0*  --   --     Liver Function Tests: Recent Labs  Lab 09/02/2019 1740 08/29/19 0549 08/30/19 0512 08/31/19 0403  AST 116* 245* 220* 156*  ALT 60* 91* 115* 109*  ALKPHOS 96 60 73 111  BILITOT 1.1 1.2 0.9 0.6  PROT 5.7* 4.0* 4.6* 4.5*  ALBUMIN 2.0* 1.9* 1.8* 1.4*   Recent Labs  Lab 09/13/2019 1740  LIPASE 23   No results for input(s): AMMONIA in the last 168 hours.  ABG    Component Value Date/Time   PHART 7.368 08/29/2019 0044   PCO2ART 41.9 08/29/2019 0044   PO2ART 502 (H) 08/29/2019 0044   HCO3 25.1 08/29/2019 0044   TCO2 27 08/29/2019 0044   ACIDBASEDEF 1.0 08/29/2019 0044   O2SAT 100.0 08/29/2019 0044     Coagulation Profile: Recent Labs  Lab 09/13/2019 1740  INR 1.8*    Cardiac Enzymes: No results for input(s): CKTOTAL, CKMB, CKMBINDEX, TROPONINI in the last 168 hours.  HbA1C: Hemoglobin-A1c  Date/Time Value Ref Range Status  12/09/2006 01:50 PM (H) <6.0 % Final   8.6 (NOTE)  Therapeutic target for the treatment of diabetes mellitus patients is <7% HbA1c.  American Diabetes Association Diabetes Care 2002;25:S33-S49.   Hgb A1c MFr Bld  Date/Time Value Ref Range Status  08/18/2019 11:54 AM 6.0 (H) 4.8 - 5.6 % Final    Comment:    (NOTE) Pre diabetes:          5.7%-6.4% Diabetes:              >6.4% Glycemic control for   <7.0% adults with diabetes   06/21/2018 01:38 PM 5.9 (H) 4.8 - 5.6 % Final    Comment:    (NOTE) Pre diabetes:          5.7%-6.4% Diabetes:              >6.4% Glycemic control for   <7.0% adults with diabetes     CBG: Recent Labs  Lab 08/30/19 1546 08/30/19 1941 08/30/19 2337 08/31/19 0323 08/31/19 0809  GLUCAP 164* 206* 203* 184* 141*  Critical care time:    Performed by: Johnsie Cancel  Total critical care time: 34 minutes  Critical care time was exclusive of separately billable procedures and treating other patients.  Critical care was necessary to treat or prevent imminent or life-threatening deterioration.  Critical care was time spent personally by me on the following activities: development of treatment plan with patient and/or surrogate as well as nursing, discussions with consultants, evaluation of patient's response to treatment, examination of patient, obtaining history from patient or surrogate, ordering and performing treatments and interventions, ordering and review of laboratory studies, ordering and review of radiographic studies, pulse oximetry and re-evaluation of patient's condition.  Johnsie Cancel, NP-C [facility] Contact / Pager information can be found on Amion  08/31/2019, 10:12 AM

## 2019-08-31 NOTE — Progress Notes (Addendum)
Patient ID: Alan Orr, male   DOB: [birth year], 74 y.o.   MRN: [id number]    3 Days Post-Op  Subjective: Patient doesn't complain of much pain.  Noted to have large LV thrombus and started on heparin gtt.  Making a little bit of urine.  Worked with therapies yesterday.  ROS: See above, otherwise other systems negative  Objective: Vital signs in last 24 hours: Temp:  [97.7 F (36.5 C)-98.6 F (37 C)] 98.3 F (36.8 C) (06/17 0400) Pulse Rate:  [72-85] 83 (06/17 0730) Resp:  [9-22] 22 (06/17 0730) BP: (90-138)/(35-103) 107/58 (06/17 0730) SpO2:  [89 %-99 %] 98 % (06/17 0730) Weight:  [105.3 kg] 105.3 kg (06/17 0500) Last BM Date:  (PTA)  Intake/Output from previous day: 06/16 0701 - 06/17 0700 In: 4723.9 [I.V.:3115.3; IV Piggyback:1608.6] Out: 540 [Urine:360; Emesis/NG output:180] Intake/Output this shift: No intake/output data recorded.  PE: Abd: soft, NGT with bilious output this morning, about 200-3000cc.  Midline wound is clean and packed.  Fascia intact.  Ileostomy with no output except some sweat.  Stoma is pink and viable. GU: foley with about 30cc of yellow urine  Lab Results:  Recent Labs    08/30/19 0512 08/31/19 0403  WBC 14.6* 15.9*  HGB 9.4* 9.1*  HCT 28.9* 28.5*  PLT 403* 360   BMET Recent Labs    08/30/19 0512 08/31/19 0403  NA 141 139  K 4.2 4.6  CL 110 109  CO2 22 21*  GLUCOSE 220* 181*  BUN 48* 62*  CREATININE 3.43* 3.76*  CALCIUM 7.4* 7.7*   PT/INR Recent Labs    09/04/2019 1740  LABPROT 19.9*  INR 1.8*   CMP     Component Value Date/Time   NA 139 08/31/2019 0403   NA 144 06/26/2019 0930   K 4.6 08/31/2019 0403   CL 109 08/31/2019 0403   CO2 21 (L) 08/31/2019 0403   GLUCOSE 181 (H) 08/31/2019 0403   BUN 62 (H) 08/31/2019 0403   BUN 15 06/26/2019 0930   CREATININE 3.76 (H) 08/31/2019 0403   CREATININE 1.42 (H) 09/20/2015 1004   CALCIUM 7.7 (L) 08/31/2019 0403   PROT 4.5 (L) 08/31/2019 0403   PROT 6.6 06/26/2019 0930    ALBUMIN 1.4 (L) 08/31/2019 0403   ALBUMIN 3.6 (L) 06/26/2019 0930   AST 156 (H) 08/31/2019 0403   ALT 109 (H) 08/31/2019 0403   ALKPHOS 111 08/31/2019 0403   BILITOT 0.6 08/31/2019 0403   BILITOT 0.6 06/26/2019 0930   GFRNONAA 15 (L) 08/31/2019 0403   GFRAA 17 (L) 08/31/2019 0403   Lipase     Component Value Date/Time   LIPASE 23 09/10/2019 1740       Studies/Results: ECHOCARDIOGRAM COMPLETE  Result Date: 08/30/2019    ECHOCARDIOGRAM REPORT   Patient Name:   Alan Orr Date of Exam: 08/30/2019 Medical Rec #:  [id number]        Height:       74.0 in Accession #:    [id number]       Weight:       213.8 lb Date of Birth:  12-04-1945        BSA:          2.236 m Patient Age:    17 years         BP:           118/55 mmHg Patient Gender: M  HR:           78 bpm. Exam Location:  Inpatient Procedure: 2D Echo, Color Doppler, Cardiac Doppler, Intracardiac Opacification            Agent and 3D Echo Indications:    Elevated Troponins  History:        Patient has prior history of Echocardiogram examinations, most                 recent 01/11/2015. Pacemaker, Defibrillator and Prior CABG,                 Arrythmias:Atrial Fibrillation; Risk Factors:Hypertension,                 Diabetes and Dyslipidemia.  Sonographer:    Raquel Sarna Senior RDCS Referring Phys: [phone] [facility]  Sonographer Comments: Scanned supine due to arm fracture. IMPRESSIONS  1. There is a 2.8 x 1.4 cm mobile protruding thrombus in the LV apex. Left ventricular ejection fraction, by estimation, is 60 to 65%. The left ventricle has normal function. The left ventricle has no regional wall motion abnormalities. Left ventricular  diastolic parameters are consistent with Grade I diastolic dysfunction (impaired relaxation). There is akinesis of the left ventricular, entire apical segment.  2. Right ventricular systolic function is normal. The right ventricular size is normal. Tricuspid regurgitation signal is inadequate for  assessing PA pressure.  3. The mitral valve is normal in structure. No evidence of mitral valve regurgitation. No evidence of mitral stenosis.  4. The aortic valve was not well visualized. Aortic valve regurgitation is not visualized. No aortic stenosis is present.  5. The inferior vena cava is normal in size with <50% respiratory variability, suggesting right atrial pressure of 8 mmHg.  6. Report of LV apical thrombus called to Dr. Chase Caller FINDINGS  Left Ventricle: There is a 2.8 x 1.4 cm mobile protruding thrombus in the LV apex. Left ventricular ejection fraction, by estimation, is 60 to 65%. The left ventricle has normal function. The left ventricle has no regional wall motion abnormalities. Definity contrast agent was given IV to delineate the left ventricular endocardial borders. The left ventricular internal cavity size was normal in size. There is no left ventricular hypertrophy. Left ventricular diastolic parameters are consistent with Grade I diastolic dysfunction (impaired relaxation). Normal left ventricular filling pressure. Right Ventricle: The right ventricular size is normal. No increase in right ventricular wall thickness. Right ventricular systolic function is normal. Tricuspid regurgitation signal is inadequate for assessing PA pressure. Left Atrium: Left atrial size was normal in size. Right Atrium: Right atrial size was normal in size. Pericardium: There is no evidence of pericardial effusion. Mitral Valve: The mitral valve is normal in structure. Normal mobility of the mitral valve leaflets. No evidence of mitral valve regurgitation. No evidence of mitral valve stenosis. Tricuspid Valve: The tricuspid valve is normal in structure. Tricuspid valve regurgitation is not demonstrated. No evidence of tricuspid stenosis. Aortic Valve: The aortic valve was not well visualized. Aortic valve regurgitation is not visualized. No aortic stenosis is present. Pulmonic Valve: The pulmonic valve was normal  in structure. Pulmonic valve regurgitation is not visualized. No evidence of pulmonic stenosis. Aorta: The aortic root is normal in size and structure. Venous: The inferior vena cava is normal in size with less than 50% respiratory variability, suggesting right atrial pressure of 8 mmHg. IAS/Shunts: No atrial level shunt detected by color flow Doppler. Additional Comments: A pacer wire is visualized.  LEFT VENTRICLE PLAX 2D LVIDd:  5.40 cm  Diastology LVIDs:         4.10 cm  LV e' lateral:   8.59 cm/s LV PW:         0.90 cm  LV E/e' lateral: 7.9 LV IVS:        0.90 cm  LV e' medial:    6.96 cm/s LVOT diam:     2.00 cm  LV E/e' medial:  9.7 LV SV:         58 LV SV Index:   26 LVOT Area:     3.14 cm  RIGHT VENTRICLE RV S prime:     7.72 cm/s TAPSE (M-mode): 1.6 cm LEFT ATRIUM           Index       RIGHT ATRIUM           Index LA diam:      3.50 cm 1.57 cm/m  RA Area:     12.50 cm LA Vol (A4C): 75.7 ml 33.85 ml/m RA Volume:   25.60 ml  11.45 ml/m  AORTIC VALVE LVOT Vmax:   85.50 cm/s LVOT Vmean:  65.800 cm/s LVOT VTI:    0.186 m  AORTA Ao Root diam: 3.40 cm MITRAL VALVE MV Area (PHT): 3.34 cm    SHUNTS MV Decel Time: 227 msec    Systemic VTI:  0.19 m MV E velocity: 67.70 cm/s  Systemic Diam: 2.00 cm MV A velocity: 81.00 cm/s MV E/A ratio:  0.84 Fransico Him MD Electronically signed by Fransico Him MD Signature Date/Time: 08/30/2019/4:44:51 PM    Final     Anti-infectives: Anti-infectives (From admission, onward)   Start     Dose/Rate Route Frequency Ordered Stop   08/29/19 1200  piperacillin-tazobactam (ZOSYN) IVPB 3.375 g     Discontinue     3.375 g 12.5 mL/hr over 240 Minutes Intravenous Every 8 hours 08/29/19 0405     08/29/19 0415  piperacillin-tazobactam (ZOSYN) IVPB 3.375 g        3.375 g 12.5 mL/hr over 240 Minutes Intravenous STAT 08/29/19 0405 08/29/19 0954   09/08/2019 1915  piperacillin-tazobactam (ZOSYN) IVPB 3.375 g        3.375 g 100 mL/hr over 30 Minutes Intravenous  Once 08/16/2019  1905 08/18/2019 2136       Assessment/Plan CAD s/p CABGx5 PAF - normally on pradaxa Biventricular ICD Ischemic cardiomyopathy HTN HLD AKI on Stage III CKD - Cr up to 3.78.  Defer to CCM, may need nephrology Hx of DVT T2DM- SSI Hypothyroidism Hypokalemia/hypocalcemia - replace IV Septic shock- pressors off today PCM - prealbumin <5.  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 Kieth Brightly for perforated R colon with feculent peritonitis - Continue NGT on LIWS and await bowel function - continue IV abx, high risk for post-op abscess.  WBC 15K.  Will follow.  Plan for 5 days of post op abx - cont TNA  - PT/OT consults - BID dressing changes - WOC consult for new ileostomy  FEN: NPO, IVF, NGT to LIWS/TNA VTE: SCDs, Lovenox ID: IV Zosyn 6/14>>   LOS: 3 days    Henreitta Cea , [facility] 08/31/2019, 7:45 AM Please see Amion for pager number during day hours 7:00am-4:30pm or 7:00am -11:30am on weekends

## 2019-08-31 NOTE — Plan of Care (Signed)
  Problem: Education: Goal: Knowledge of General Education information will improve Description: Including pain rating scale, medication(s)/side effects and non-pharmacologic comfort measures 08/31/2019 1413 by Ginnie Smart, RN Outcome: Progressing 08/31/2019 1412 by Ginnie Smart, RN Outcome: Progressing   Problem: Health Behavior/Discharge Planning: Goal: Ability to manage health-related needs will improve 08/31/2019 1413 by Ginnie Smart, RN Outcome: Progressing 08/31/2019 1412 by Ginnie Smart, RN Outcome: Progressing   Problem: Clinical Measurements: Goal: Ability to maintain clinical measurements within normal limits will improve 08/31/2019 1413 by Ginnie Smart, RN Outcome: Progressing 08/31/2019 1412 by Ginnie Smart, RN Outcome: Progressing Goal: Will remain free from infection 08/31/2019 1413 by Ginnie Smart, RN Outcome: Progressing 08/31/2019 1412 by Ginnie Smart, RN Outcome: Progressing Goal: Diagnostic test results will improve 08/31/2019 1413 by Ginnie Smart, RN Outcome: Progressing 08/31/2019 1412 by Ginnie Smart, RN Outcome: Progressing Goal: Respiratory complications will improve 08/31/2019 1413 by Ginnie Smart, RN Outcome: Progressing 08/31/2019 1412 by Ginnie Smart, RN Outcome: Progressing Goal: Cardiovascular complication will be avoided 08/31/2019 1413 by Ginnie Smart, RN Outcome: Progressing 08/31/2019 1412 by Ginnie Smart, RN Outcome: Progressing   Problem: Activity: Goal: Risk for activity intolerance will decrease 08/31/2019 1413 by Ginnie Smart, RN Outcome: Progressing 08/31/2019 1412 by Ginnie Smart, RN Outcome: Progressing   Problem: Nutrition: Goal: Adequate nutrition will be maintained 08/31/2019 1413 by Ginnie Smart, RN Outcome: Progressing 08/31/2019 1412 by Ginnie Smart, RN Outcome: Progressing   Problem: Coping: Goal: Level of anxiety will decrease 08/31/2019 1413 by Ginnie Smart, RN Outcome:  Progressing 08/31/2019 1412 by Ginnie Smart, RN Outcome: Progressing   Problem: Elimination: Goal: Will not experience complications related to urinary retention 08/31/2019 1413 by Ginnie Smart, RN Outcome: Progressing 08/31/2019 1412 by Ginnie Smart, RN Outcome: Progressing   Problem: Pain Managment: Goal: General experience of comfort will improve 08/31/2019 1413 by Ginnie Smart, RN Outcome: Progressing 08/31/2019 1412 by Ginnie Smart, RN Outcome: Progressing   Problem: Safety: Goal: Ability to remain free from injury will improve 08/31/2019 1413 by Ginnie Smart, RN Outcome: Progressing 08/31/2019 1412 by Ginnie Smart, RN Outcome: Progressing   Problem: Skin Integrity: Goal: Risk for impaired skin integrity will decrease 08/31/2019 1413 by Ginnie Smart, RN Outcome: Progressing 08/31/2019 1412 by Ginnie Smart, RN Outcome: Progressing   Problem: Education: Goal: Required Educational Video(s) Outcome: Progressing   Problem: Clinical Measurements: Goal: Postoperative complications will be avoided or minimized Outcome: Progressing   Problem: Skin Integrity: Goal: Demonstration of wound healing without infection will improve Outcome: Progressing   Problem: Elimination: Goal: Will not experience complications related to bowel motility 08/31/2019 1413 by Ginnie Smart, RN Outcome: Not Progressing Note: Still awaiting output from Ileostomy site.  Continues with NGT to LWS. 08/31/2019 1412 by Ginnie Smart, RN Outcome: Progressing

## 2019-08-31 NOTE — Progress Notes (Signed)
ANTICOAGULATION CONSULT NOTE - Follow Up Consult  Pharmacy Consult:  Heparin Indication:  History of Afib/DVT and acute LV thrombus  No Known Allergies  Patient Measurements: Height: 6\' 2"  (188 cm) Weight: 105.3 kg (232 lb 2.3 oz) IBW/kg (Calculated) : 82.2 Heparin Dosing Weight: 97 kg  Vital Signs: Temp: 97.6 F (36.4 C) (06/17 0800) Temp Source: Oral (06/17 0800) BP: 118/46 (06/17 0800) Pulse Rate: 77 (06/17 0800)  Labs: Recent Labs    09/08/2019 1740 08/29/19 0044 08/29/19 0549 08/29/19 0941 08/29/19 1555 08/29/19 1555 08/29/19 2058 08/30/19 0512 08/30/19 1322 08/31/19 0403 08/31/19 0732  HGB  --    < >   < >  --  9.6*   < >  --  9.4*  --  9.1*  --   HCT  --    < >   < >  --  29.3*  --   --  28.9*  --  28.5*  --   PLT  --    < >   < >  --  367  --   --  403*  --  360  --   APTT 60*  --   --  22*  --   --  52*  --   --   --   --   LABPROT 19.9*  --   --   --   --   --   --   --   --   --   --   INR 1.8*  --   --   --   --   --   --   --   --   --   --   HEPARINUNFRC  --   --   --   --   --   --   --   --   --   --  0.21*  CREATININE 2.52*   < >   < >  --  2.94*  --   --  3.43*  --  3.76*  --   TROPONINIHS  --   --   --   --   --   --   --   --  67*  --   --    < > = values in this interval not displayed.    Estimated Creatinine Clearance: 22.3 mL/min (A) (by C-G formula based on SCr of 3.76 mg/dL (H)).   Medical History: Past Medical History:  Diagnosis Date  . CAD (coronary artery disease)   . DM (diabetes mellitus) ([place])    type 2   . DVT (deep venous thrombosis) ([place])   . Hyperlipidemia   . Hypertension   . ICD (implantable cardioverter-defibrillator), single, in situ   . Ischemic cardiomyopathy   . PAF (paroxysmal atrial fibrillation) ([place])   . RBBB   . S/P CABG x 5     Assessment: 69 YOM presented with bowel obstruction and received Praxbind to reverse Pradaxa for surgery.  He was started on prophylactic Lovenox post-op and his last dose was 6/16  around 1000.  Pharmacy consulted to start IV heparin for history of Afib/DVT and new LV apical thrombus on ECHO.    Initial heparin level is low at 0.21. Hgb slightly lower than yesterday at 9.1 and pltc wnl at 360. No bleeding noted.   Goal of Therapy:  Heparin level 0.3-0.7 units/ml Monitor platelets by anticoagulation protocol: Yes   Plan:  Increase heparin gtt to 1300 units/hr, no bolus  per Cards Check 8 hr heparin level Daily heparin level and CBC  Vertis Kelch, PharmD, [facility] PGY2 Cardiology Pharmacy Resident Phone [phone] 08/31/2019       8:51 AM  Please check [email or web address] for unit-specific pharmacist phone numbers

## 2019-08-31 NOTE — Progress Notes (Signed)
eLink Physician-Brief Progress Note Patient Name: ANTONINO NIENHUIS DOB: 11-04-1945 MRN: [id number]   Date of Service  08/31/2019  HPI/Events of Note  Bedside RN had requested medication for agitation, however when I accessed his room via camera he is resting peacefully.  eICU Interventions  No intervention warranted at this time.        Kerry Kass Raymie Trani 08/31/2019, 11:10 PM

## 2019-09-01 LAB — CBC
HCT: 27.4 % — ABNORMAL LOW (ref 39.0–52.0)
Hemoglobin: 8.6 g/dL — ABNORMAL LOW (ref 13.0–17.0)
MCH: 35 pg — ABNORMAL HIGH (ref 26.0–34.0)
MCHC: 31.4 g/dL (ref 30.0–36.0)
MCV: 111.4 fL — ABNORMAL HIGH (ref 80.0–100.0)
Platelets: 331 10*3/uL (ref 150–400)
RBC: 2.46 MIL/uL — ABNORMAL LOW (ref 4.22–5.81)
RDW: 17.2 % — ABNORMAL HIGH (ref 11.5–15.5)
WBC: 15.1 10*3/uL — ABNORMAL HIGH (ref 4.0–10.5)
nRBC: 1.9 % — ABNORMAL HIGH (ref 0.0–0.2)

## 2019-09-01 LAB — BASIC METABOLIC PANEL
Anion gap: 8 (ref 5–15)
BUN: 83 mg/dL — ABNORMAL HIGH (ref 8–23)
CO2: 22 mmol/L (ref 22–32)
Calcium: 7.9 mg/dL — ABNORMAL LOW (ref 8.9–10.3)
Chloride: 106 mmol/L (ref 98–111)
Creatinine, Ser: 3.93 mg/dL — ABNORMAL HIGH (ref 0.61–1.24)
GFR calc Af Amer: 16 mL/min — ABNORMAL LOW (ref >60)
GFR calc non Af Amer: 14 mL/min — ABNORMAL LOW (ref >60)
Glucose, Bld: 216 mg/dL — ABNORMAL HIGH (ref 70–99)
Potassium: 4.7 mmol/L (ref 3.5–5.1)
Sodium: 136 mmol/L (ref 135–145)

## 2019-09-01 LAB — HEPARIN LEVEL (UNFRACTIONATED): Heparin Unfractionated: 0.68 IU/mL (ref 0.30–0.70)

## 2019-09-01 LAB — GLUCOSE, CAPILLARY
Glucose-Capillary: 202 mg/dL — ABNORMAL HIGH (ref 70–99)
Glucose-Capillary: 187 mg/dL — ABNORMAL HIGH (ref 70–99)
Glucose-Capillary: 190 mg/dL — ABNORMAL HIGH (ref 70–99)
Glucose-Capillary: 180 mg/dL — ABNORMAL HIGH (ref 70–99)
Glucose-Capillary: 212 mg/dL — ABNORMAL HIGH (ref 70–99)
Glucose-Capillary: 218 mg/dL — ABNORMAL HIGH (ref 70–99)
Glucose-Capillary: 204 mg/dL — ABNORMAL HIGH (ref 70–99)

## 2019-09-01 LAB — PHOSPHORUS: Phosphorus: 4.2 mg/dL (ref 2.5–4.6)

## 2019-09-01 LAB — MAGNESIUM: Magnesium: 2.5 mg/dL — ABNORMAL HIGH (ref 1.7–2.4)

## 2019-09-01 MED ORDER — TRAVASOL 10 % IV SOLN
INTRAVENOUS | Status: AC
  Filled 2019-09-01: qty 1504.8

## 2019-09-01 MED ORDER — ORAL CARE MOUTH RINSE
15.0000 mL | Freq: Two times a day (BID) | OROMUCOSAL | Status: DC
  Administered 2019-09-01 – 2019-09-02 (×3): 15 mL via OROMUCOSAL

## 2019-09-01 MED ORDER — SODIUM CHLORIDE 0.9 % IV SOLN
INTRAVENOUS | Status: DC | PRN
  Administered 2019-09-01: 250 mL via INTRAVENOUS

## 2019-09-01 MED ORDER — FUROSEMIDE 10 MG/ML IJ SOLN
10.0000 mg/h | INTRAVENOUS | Status: DC
  Administered 2019-09-01: 10 mg/h via INTRAVENOUS
  Filled 2019-09-01: qty 24

## 2019-09-01 MED ORDER — FUROSEMIDE 10 MG/ML IJ SOLN
80.0000 mg | Freq: Two times a day (BID) | INTRAMUSCULAR | Status: DC
  Administered 2019-09-01: 80 mg via INTRAVENOUS
  Filled 2019-09-01: qty 8

## 2019-09-01 MED ORDER — FUROSEMIDE 10 MG/ML IJ SOLN
120.0000 mg | Freq: Once | INTRAVENOUS | Status: AC
  Administered 2019-09-01: 120 mg via INTRAVENOUS
  Filled 2019-09-01: qty 12

## 2019-09-01 MED ORDER — METOLAZONE 5 MG PO TABS
5.0000 mg | ORAL_TABLET | Freq: Once | ORAL | Status: AC
  Administered 2019-09-01: 5 mg via ORAL
  Filled 2019-09-01: qty 1

## 2019-09-01 NOTE — Progress Notes (Signed)
ANTICOAGULATION CONSULT NOTE - Follow Up Consult  Pharmacy Consult: Heparin Indication:  History of Afib/DVT and acute LV thrombus  No Known Allergies  Patient Measurements: Height: 6\' 2"  (188 cm) Weight: 108.4 kg (238 lb 15.7 oz) IBW/kg (Calculated) : 82.2 Heparin Dosing Weight: 97 kg  Vital Signs: Temp: 98.8 F (37.1 C) (06/18 0700) Temp Source: Axillary (06/18 0700) BP: 132/56 (06/18 0800) Pulse Rate: 52 (06/18 0800)  Labs: Recent Labs    08/29/19 1555 08/29/19 2058 08/30/19 0512 08/30/19 0512 08/30/19 1322 08/31/19 0403 08/31/19 0732 08/31/19 1700 09/01/19 0405  HGB   < >  --  9.4*   < >  --  9.1*  --   --  8.6*  HCT   < >  --  28.9*  --   --  28.5*  --   --  27.4*  PLT   < >  --  403*  --   --  360  --   --  331  APTT  --  52*  --   --   --   --   --   --   --   HEPARINUNFRC  --   --   --   --   --   --  0.21* 0.50 0.68  CREATININE   < >  --  3.43*  --   --  3.76*  --   --  3.93*  TROPONINIHS  --   --   --   --  66*  --   --   --   --    < > = values in this interval not displayed.    Estimated Creatinine Clearance: 21.6 mL/min (A) (by C-G formula based on SCr of 3.93 mg/dL (H)).   Medical History: Past Medical History:  Diagnosis Date  . CAD (coronary artery disease)   . DM (diabetes mellitus) ([place])    type 2   . DVT (deep venous thrombosis) ([place])   . Hyperlipidemia   . Hypertension   . ICD (implantable cardioverter-defibrillator), single, in situ   . Ischemic cardiomyopathy   . PAF (paroxysmal atrial fibrillation) ([place])   . RBBB   . 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  He was started on prophylactic Lovenox post-op (last dose was ~1000 on 6/16).  Pharmacy was consulted to start IV heparin (no bolus) for history of afib/DVT and new LV apical thrombus on ECHO.    Heparin level remains at goal but trending up  towards top of the range at 0.68. Hg drifting down to 8.6, plt wnl. Noted AKI with rising SCr. No bleeding or issues with infusion per discussion with RN. She does report she moved heparin to PIV this morning  Goal of Therapy:  Heparin level 0.3-0.7 units/ml Monitor platelets by anticoagulation protocol: Yes   Plan:  Decrease heparin infusion slightly to 1250 units/hr to help ensure level stays in range Monitor daily heparin level and CBC Monitor for signs/symptoms of bleeding   Arturo Morton, PharmD, BCPS Please check AMION for all [place] contact numbers Clinical Pharmacist 09/01/2019 9:55 AM

## 2019-09-01 NOTE — Progress Notes (Signed)
Pharmacy Antibiotic Note  Alan Orr is a 74 y.o. male admitted on 09/01/2019 with perforation of right colon with feculent peritonitis and purulent peritonitis.  Pharmacy has been consulted for Zosyn dosing - day #4. S/p R hemicolectomy with end ileostomy on 6/15. Planning 5 days of therapy post-op per Surgery notes. Noted AKI - SCr trending up to 3.93, CrCl borderline for dose adjustment at ~22.  Plan: Zosyn 3.375g IV Q8H (4-hour infusion) - borderline for dose decrease Monitor clinical progress, c/s, renal function   Height: 6\' 2"  (188 cm) Weight: 108.4 kg (238 lb 15.7 oz) IBW/kg (Calculated) : 82.2  Temp (24hrs), Avg:98.6 F (37 C), Min:97.5 F (36.4 C), Max:98.9 F (37.2 C)  Recent Labs  Lab 08/29/19 0549 08/29/19 1555 08/30/19 0512 08/31/19 0403 09/01/19 0405  WBC 8.1 12.0* 14.6* 15.9* 15.1*  CREATININE 2.48* 2.94* 3.43* 3.76* 3.93*  LATICACIDVEN 2.1* 2.0*  --   --   --     Estimated Creatinine Clearance: 21.6 mL/min (A) (by C-G formula based on SCr of 3.93 mg/dL (H)).    No Known Allergies   Arturo Morton, PharmD, BCPS Please check AMION for all [place] contact numbers Clinical Pharmacist 09/01/2019 9:59 AM

## 2019-09-01 NOTE — TOC Initial Note (Signed)
Transition of Care [facility]) - Initial/Assessment Note    Patient Details  Name: Alan Orr MRN: [id number] Date of Birth: 1946-01-25  Transition of Care [facility]) CM/SW Contact:    Jacquelynn Cree Phone Number: 09/01/2019, 1:21 PM  Clinical Narrative:                 Considering patient is not fully oriented, CSW reached out to patient's nephew Richardson Landry to discuss discharge plan. Patient's nephew stated patient was discharged from [facility] on 2/35 to [place] [facility]. Patient was previously hospitalized due to breaking his arm. He lived alone and was independent. Nephew expressed he would like patient to return to [facility] for short-term rehab at discharge. He expressed understanding that the bed may not be able to be held.  CSW contacted [facility] and spoke to [place]. Debbie expressed patient's bed is currently still available. If bed availability diminished and his bed is the only bed left, it could not be held unless $215 per day is paid to hold it. CSW agreed to keep SNF updated on patient discharge.   Expected Discharge Plan: Skilled Nursing Facility Barriers to Discharge: Continued Medical Work up   Patient Goals and CMS Choice   CMS Medicare.gov Compare Post Acute Care list provided to:: Patient Represenative (must comment) Richardson Landry) Choice offered to / list presented to :  Richardson Landry, nephew)  Expected Discharge Plan and Services Expected Discharge Plan: [place]       Living arrangements for the past 2 months: Single Family Home                                      Prior Living Arrangements/Services Living arrangements for the past 2 months: Single Family Home Lives with:: Self Patient language and need for interpreter reviewed:: Yes Do you feel safe going back to the place where you live?: Yes      Need for Family Participation in Patient Care: Yes (Comment) Care giver support system in place?: Yes (comment)   Criminal Activity/Legal Involvement  Pertinent to Current Situation/Hospitalization: No - Comment as needed  Activities of Daily Living Home Assistive Devices/Equipment: Eyeglasses, Dentures (specify type), Hearing aid ADL Screening (condition at time of admission) Patient's cognitive ability adequate to safely complete daily activities?: Yes Is the patient deaf or have difficulty hearing?: Yes Does the patient have difficulty seeing, even when wearing glasses/contacts?: Yes Does the patient have difficulty concentrating, remembering, or making decisions?: No Patient able to express need for assistance with ADLs?: Yes Does the patient have difficulty dressing or bathing?: Yes Independently performs ADLs?: No Communication: Independent Dressing (OT): Needs assistance Is this a change from baseline?: Pre-admission baseline Grooming: Needs assistance Is this a change from baseline?: Pre-admission baseline Feeding: Needs assistance Is this a change from baseline?: Pre-admission baseline Bathing: Dependent Is this a change from baseline?: Pre-admission baseline Toileting: Dependent Is this a change from baseline?: Pre-admission baseline In/Out Bed: Dependent Is this a change from baseline?: Pre-admission baseline Walks in Home: Dependent Is this a change from baseline?: Pre-admission baseline Does the patient have difficulty walking or climbing stairs?: Yes Weakness of Legs: Both Weakness of Arms/Hands: Both  Permission Sought/Granted Permission sought to share information with : Facility Sport and exercise psychologist, Family Supports    Share Information with NAME: Richardson Landry  Permission granted to share info w AGENCY: SNF  Permission granted to share info w Relationship: [employer]  granted to share info w Contact Information: [phone]  Emotional Assessment   Attitude/Demeanor/Rapport: Unable to Assess Affect (typically observed): Unable to Assess Orientation: : Oriented to Self Alcohol / Substance Use: Not  Applicable Psych Involvement: No (comment)  Admission diagnosis:  Bowel perforation [facility]) [K63.1] Patient Active Problem List   Diagnosis Date Noted  . ATN (acute tubular necrosis) ([place])   . Hypokalemia 08/29/2019  . Bowel perforation ([place])   . Unable to care for self 08/19/2019  . Weakness 08/19/2019  . Frequent falls 08/18/2019  . AKI (acute kidney injury) ([place]) 08/15/2019  . Cellulitis of right lower extremity 06/21/2018  . Salmonella sepsis ([place]) 10/21/2016  . Salmonella gastroenteritis 10/20/2016  . Transaminasemia 10/18/2016  . Pancytopenia ([place]) 10/18/2016  . DM type 2 (diabetes mellitus, type 2) ([place]) 10/18/2016  . Septic shock ([place]) 08/22/2016  . Thrombocytopenia ([place]) 08/22/2016  . Tick bite of right thigh 08/22/2016  . Abnormal serum protein electrophoresis 08/21/2014  . Leg wound, left 11/03/2013  . History of carotid artery disease 03/03/2013  . Cardiomyopathy, ischemic 11/08/2012  . CAD s/p CABG  11/08/2012  . Hyperlipidemia 11/08/2012  . Paroxysmal atrial fibrillation ([place]) 11/08/2012  . Ventricular tachycardia ([place]) 11/08/2012  . Type 2 diabetes mellitus with stage 3 chronic kidney disease ([place]) 08/20/2009  . PACEMAKER, PERMANENT 08/20/2009  . Biventricular ICD (implantable cardioverter-defibrillator) in place 08/20/2009   PCP:  [name], MD Pharmacy:   [facility], [place] [address] [place] [address] Phone: [phone] Fax: [phone]     Social Determinants of Health (SDOH) Interventions    Readmission Risk Interventions Readmission Risk Prevention Plan 08/21/2019  Transportation Screening Complete  Medication Review (RN CM) Complete  Some recent data might be hidden

## 2019-09-01 NOTE — Progress Notes (Signed)
Patient ID: Alan Orr, male   DOB: 29-Mar-1945, 74 y.o.   MRN: [id number]    4 Days Post-Op  Subjective: Patient with no pain this am.  No other complaints, just laying in bed.  Very soft spoken.  Says he worked with therapies yesterday.  ROS: See above, otherwise other systems negative  Objective: Vital signs in last 24 hours: Temp:  [97.5 F (36.4 C)-98.9 F (37.2 C)] 98.8 F (37.1 C) (06/18 0700) Pulse Rate:  [77-98] 83 (06/18 0700) Resp:  [10-28] 18 (06/18 0700) BP: (112-168)/(42-117) 117/44 (06/18 0630) SpO2:  [96 %-100 %] 97 % (06/18 0700) Last BM Date: 08/20/19  Intake/Output from previous day: 06/17 0701 - 06/18 0700 In: 3445.1 [I.V.:3241.9; IV Piggyback:203.2] Out: 721 [Urine:611; Emesis/NG output:110] Intake/Output this shift: No intake/output data recorded.  PE: Abd: soft, minimally tender, midline wound is clean and packed. Fascia intact.  Ileostomy with sweat.  Stoma is pink and viable.  NGT with no output currently and only 110cc yesterday.  Lab Results:  Recent Labs    08/31/19 0403 09/01/19 0405  WBC 15.9* 15.1*  HGB 9.1* 8.6*  HCT 28.5* 27.4*  PLT 360 331   BMET Recent Labs    08/31/19 0403 09/01/19 0405  NA 139 136  K 4.6 4.7  CL 109 106  CO2 21* 22  GLUCOSE 181* 216*  BUN 62* 83*  CREATININE 3.76* 3.93*  CALCIUM 7.7* 7.9*   PT/INR No results for input(s): LABPROT, INR in the last 72 hours. CMP     Component Value Date/Time   NA 136 09/01/2019 0405   NA 144 06/26/2019 0930   K 4.7 09/01/2019 0405   CL 106 09/01/2019 0405   CO2 22 09/01/2019 0405   GLUCOSE 216 (H) 09/01/2019 0405   BUN 83 (H) 09/01/2019 0405   BUN 15 06/26/2019 0930   CREATININE 3.93 (H) 09/01/2019 0405   CREATININE 1.42 (H) 09/20/2015 1004   CALCIUM 7.9 (L) 09/01/2019 0405   PROT 4.5 (L) 08/31/2019 0403   PROT 6.6 06/26/2019 0930   ALBUMIN 1.4 (L) 08/31/2019 0403   ALBUMIN 3.6 (L) 06/26/2019 0930   AST 156 (H) 08/31/2019 0403   ALT 109 (H) 08/31/2019  0403   ALKPHOS 111 08/31/2019 0403   BILITOT 0.6 08/31/2019 0403   BILITOT 0.6 06/26/2019 0930   GFRNONAA 14 (L) 09/01/2019 0405   GFRAA 16 (L) 09/01/2019 0405   Lipase     Component Value Date/Time   LIPASE 23 09/09/2019 1740       Studies/Results: ECHOCARDIOGRAM COMPLETE  Result Date: 08/30/2019    ECHOCARDIOGRAM REPORT   Patient Name:   Alan Orr Date of Exam: 08/30/2019 Medical Rec #:  [id number]        Height:       74.0 in Accession #:    [id number]       Weight:       213.8 lb Date of Birth:  Oct 23, 1945        BSA:          2.236 m Patient Age:    25 years         BP:           118/55 mmHg Patient Gender: M                HR:           78 bpm. Exam Location:  Inpatient Procedure: 2D Echo, Color Doppler, Cardiac Doppler, Intracardiac Opacification  Agent and 3D Echo Indications:    Elevated Troponins  History:        Patient has prior history of Echocardiogram examinations, most                 recent 01/11/2015. Pacemaker, Defibrillator and Prior CABG,                 Arrythmias:Atrial Fibrillation; Risk Factors:Hypertension,                 Diabetes and Dyslipidemia.  Sonographer:    Raquel Sarna Senior RDCS Referring Phys: [id number] [facility]  Sonographer Comments: Scanned supine due to arm fracture. IMPRESSIONS  1. There is a 2.8 x 1.4 cm mobile protruding thrombus in the LV apex. Left ventricular ejection fraction, by estimation, is 60 to 65%. The left ventricle has normal function. The left ventricle has no regional wall motion abnormalities. Left ventricular  diastolic parameters are consistent with Grade I diastolic dysfunction (impaired relaxation). There is akinesis of the left ventricular, entire apical segment.  2. Right ventricular systolic function is normal. The right ventricular size is normal. Tricuspid regurgitation signal is inadequate for assessing PA pressure.  3. The mitral valve is normal in structure. No evidence of mitral valve regurgitation. No evidence of  mitral stenosis.  4. The aortic valve was not well visualized. Aortic valve regurgitation is not visualized. No aortic stenosis is present.  5. The inferior vena cava is normal in size with <50% respiratory variability, suggesting right atrial pressure of 8 mmHg.  6. Report of LV apical thrombus called to Dr. Chase Caller FINDINGS  Left Ventricle: There is a 2.8 x 1.4 cm mobile protruding thrombus in the LV apex. Left ventricular ejection fraction, by estimation, is 60 to 65%. The left ventricle has normal function. The left ventricle has no regional wall motion abnormalities. Definity contrast agent was given IV to delineate the left ventricular endocardial borders. The left ventricular internal cavity size was normal in size. There is no left ventricular hypertrophy. Left ventricular diastolic parameters are consistent with Grade I diastolic dysfunction (impaired relaxation). Normal left ventricular filling pressure. Right Ventricle: The right ventricular size is normal. No increase in right ventricular wall thickness. Right ventricular systolic function is normal. Tricuspid regurgitation signal is inadequate for assessing PA pressure. Left Atrium: Left atrial size was normal in size. Right Atrium: Right atrial size was normal in size. Pericardium: There is no evidence of pericardial effusion. Mitral Valve: The mitral valve is normal in structure. Normal mobility of the mitral valve leaflets. No evidence of mitral valve regurgitation. No evidence of mitral valve stenosis. Tricuspid Valve: The tricuspid valve is normal in structure. Tricuspid valve regurgitation is not demonstrated. No evidence of tricuspid stenosis. Aortic Valve: The aortic valve was not well visualized. Aortic valve regurgitation is not visualized. No aortic stenosis is present. Pulmonic Valve: The pulmonic valve was normal in structure. Pulmonic valve regurgitation is not visualized. No evidence of pulmonic stenosis. Aorta: The aortic root is  normal in size and structure. Venous: The inferior vena cava is normal in size with less than 50% respiratory variability, suggesting right atrial pressure of 8 mmHg. IAS/Shunts: No atrial level shunt detected by color flow Doppler. Additional Comments: A pacer wire is visualized.  LEFT VENTRICLE PLAX 2D LVIDd:         5.40 cm  Diastology LVIDs:         4.10 cm  LV e' lateral:   8.59 cm/s LV PW:  0.90 cm  LV E/e' lateral: 7.9 LV IVS:        0.90 cm  LV e' medial:    6.96 cm/s LVOT diam:     2.00 cm  LV E/e' medial:  9.7 LV SV:         58 LV SV Index:   26 LVOT Area:     3.14 cm  RIGHT VENTRICLE RV S prime:     7.72 cm/s TAPSE (M-mode): 1.6 cm LEFT ATRIUM           Index       RIGHT ATRIUM           Index LA diam:      3.50 cm 1.57 cm/m  RA Area:     12.50 cm LA Vol (A4C): 75.7 ml 33.85 ml/m RA Volume:   25.60 ml  11.45 ml/m  AORTIC VALVE LVOT Vmax:   85.50 cm/s LVOT Vmean:  65.800 cm/s LVOT VTI:    0.186 m  AORTA Ao Root diam: 3.40 cm MITRAL VALVE MV Area (PHT): 3.34 cm    SHUNTS MV Decel Time: 227 msec    Systemic VTI:  0.19 m MV E velocity: 67.70 cm/s  Systemic Diam: 2.00 cm MV A velocity: 81.00 cm/s MV E/A ratio:  0.84 Fransico Him MD Electronically signed by Fransico Him MD Signature Date/Time: 08/30/2019/4:44:51 PM    Final     Anti-infectives: Anti-infectives (From admission, onward)   Start     Dose/Rate Route Frequency Ordered Stop   08/29/19 1200  piperacillin-tazobactam (ZOSYN) IVPB 3.375 g     Discontinue     3.375 g 12.5 mL/hr over 240 Minutes Intravenous Every 8 hours 08/29/19 0405     08/29/19 0415  piperacillin-tazobactam (ZOSYN) IVPB 3.375 g        3.375 g 12.5 mL/hr over 240 Minutes Intravenous STAT 08/29/19 0405 08/29/19 0954   08/15/2019 1915  piperacillin-tazobactam (ZOSYN) IVPB 3.375 g        3.375 g 100 mL/hr over 30 Minutes Intravenous  Once 09/01/2019 1905 08/30/2019 2136       Assessment/Plan CAD s/p CABGx5 PAF - normally on pradaxa Biventricular ICD Ischemic  cardiomyopathy HTN HLD AKI on Stage III CKD - Crup to 3.93, neph following.  UOP better yesterday at 600cc Hx of DVT T2DM- SSI Hypothyroidism Hypokalemia/hypocalcemia - resolved Septic shock- pressors off, improving PCM - prealbumin <5. TNA initiated LV thrombus - ok for heparin gtt, no bolus   POD 3,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 Albertina Senegal perforated R colon with feculent peritonitis - Clamp NGT and try sips of clears from the floor given minimal NGT output. - continue IV abx, high risk for post-op abscess. WBC 15K.  Will follow.  Plan for 5 days of post op abx -cont TNA - PT/OT consults - BID dressing changes - WOC consult for new ileostomy  FEN: NPO, IVF, NGT clamped/TNA VTE: SCDs,Lovenox ID: IV Zosyn 6/14>>   LOS: 4 days    Henreitta Cea , [facility] 09/01/2019, 7:47 AM Please see Amion for pager number during day hours 7:00am-4:30pm or 7:00am -11:30am on weekends

## 2019-09-01 NOTE — NC FL2 (Signed)
[place] MEDICAID FL2 LEVEL OF CARE SCREENING TOOL     IDENTIFICATION  Patient Name: Alan Orr Birthdate: [birth year] Sex: male Admission Date (Current Location): 08/23/2019  [facility] and [place] Number:  Herbalist and Address:  [facility], [place] [address], [place], [place] [address]      Provider Number: [id number]  Attending Physician Name and Address:  Kipp Brood, MD  Relative Name and Phone Number:  Driscilla Moats    Current Level of Care: Hospital Recommended Level of Care: [place] Prior Approval Number:    Date Approved/Denied:   PASRR Number: [id number]  Discharge Plan: SNF    Current Diagnoses: Patient Active Problem List   Diagnosis Date Noted  . ATN (acute tubular necrosis) ([place])   . Hypokalemia 08/29/2019  . Bowel perforation ([place])   . Unable to care for self 08/19/2019  . Weakness 08/19/2019  . Frequent falls 08/18/2019  . AKI (acute kidney injury) ([place]) 08/15/2019  . Cellulitis of right lower extremity 06/21/2018  . Salmonella sepsis ([place]) 10/21/2016  . Salmonella gastroenteritis 10/20/2016  . Transaminasemia 10/18/2016  . Pancytopenia ([place]) 10/18/2016  . DM type 2 (diabetes mellitus, type 2) ([place]) 10/18/2016  . Septic shock ([place]) 08/22/2016  . Thrombocytopenia ([place]) 08/22/2016  . Tick bite of right thigh 08/22/2016  . Abnormal serum protein electrophoresis 08/21/2014  . Leg wound, left 11/03/2013  . History of carotid artery disease 03/03/2013  . Cardiomyopathy, ischemic 11/08/2012  . CAD s/p CABG  11/08/2012  . Hyperlipidemia 11/08/2012  . Paroxysmal atrial fibrillation ([place]) 11/08/2012  . Ventricular tachycardia ([place]) 11/08/2012  . Type 2 diabetes mellitus with stage 3 chronic kidney disease ([place]) 08/20/2009  . PACEMAKER, PERMANENT 08/20/2009  . Biventricular ICD (implantable cardioverter-defibrillator) in place 08/20/2009    Orientation RESPIRATION BLADDER Height & Weight      Self  Normal Continent, External catheter Weight: 238 lb 15.7 oz (108.4 kg) Height:  6\' 2"  (188 cm)  BEHAVIORAL SYMPTOMS/MOOD NEUROLOGICAL BOWEL NUTRITION STATUS      Continent Diet (See discharge summary)  AMBULATORY STATUS COMMUNICATION OF NEEDS Skin   Extensive Assist Verbally Normal                       Personal Care Assistance Level of Assistance  Bathing, Feeding, Dressing Bathing Assistance: Maximum assistance Feeding assistance: Maximum assistance Dressing Assistance: Maximum assistance     Functional Limitations Info  Sight, Hearing, Speech Sight Info: Impaired Hearing Info: Impaired Speech Info: Adequate    SPECIAL CARE FACTORS FREQUENCY  PT (By licensed PT), OT (By licensed OT)     PT Frequency: 5x a week OT Frequency: 5x a week            Contractures      Additional Factors Info  Code Status, Allergies Code Status Info: FULL Allergies Info: NKA           Current Medications (09/01/2019):  This is the current hospital active medication list Current Facility-Administered Medications  Medication Dose Route Frequency Provider Last Rate Last Admin  . 0.9 %  sodium chloride infusion   Intravenous PRN Kipp Brood, MD   Paused at 09/01/19 0949  . chlorhexidine (PERIDEX) 0.12 % solution 15 mL  15 mL Mouth Rinse BID Domenic Polite, MD   15 mL at 09/01/19 0815  . Chlorhexidine Gluconate Cloth 2 % PADS 6 each  6 each Topical Daily Kinsinger, Arta Bruce, MD   6 each at  09/01/19 1003  . dextrose 5 % in lactated ringers infusion   Intravenous Continuous Brand Males, MD   Paused at 08/31/19 2127  . famotidine (PEPCID) IVPB 20 mg premix  20 mg Intravenous Q24H Paulita Fujita B, NP 100 mL/hr at 09/01/19 1000 Rate Verify at 09/01/19 1000  . fentaNYL (SUBLIMAZE) injection 12.5-50 mcg  12.5-50 mcg Intravenous Q2H PRN Brand Males, MD      . heparin ADULT infusion 100 units/mL (25000 units/250mL sodium chloride 0.45%)  1,250 Units/hr Intravenous  Continuous von Dohlen, Haley B, RPH 12.5 mL/hr at 09/01/19 1000 1,250 Units/hr at 09/01/19 1000  . insulin aspart (novoLOG) injection 2-6 Units  2-6 Units Subcutaneous Q4H Paulita Fujita B, NP   4 Units at 09/01/19 1158  . levothyroxine (SYNTHROID, LEVOTHROID) injection 75 mcg  75 mcg Intravenous Daily Paulita Fujita B, NP   75 mcg at 09/01/19 0848  . MEDLINE mouth rinse  15 mL Mouth Rinse q12n4p Domenic Polite, MD   15 mL at 09/01/19 1158  . MEDLINE mouth rinse  15 mL Mouth Rinse BID Agarwala, Ravi, MD   15 mL at 09/01/19 1004  . methocarbamol (ROBAXIN) 500 mg in dextrose 5 % 50 mL IVPB  500 mg Intravenous Q8H PRN Saverio Danker, PA-C      . ondansetron [facility]) injection 4 mg  4 mg Intravenous Q6H PRN Howerter, Justin B, DO      . piperacillin-tazobactam (ZOSYN) IVPB 3.375 g  3.375 g Intravenous Q8H Bryk, Veronda P, RPH 12.5 mL/hr at 09/01/19 0500 Rate Verify at 09/01/19 0500  . sodium chloride flush (NS) 0.9 % injection 10-40 mL  10-40 mL Intracatheter Q12H Domenic Polite, MD   10 mL at 09/01/19 0852  . sodium chloride flush (NS) 0.9 % injection 10-40 mL  10-40 mL Intracatheter PRN Domenic Polite, MD      . sodium chloride flush (NS) 0.9 % injection 3 mL  3 mL Intravenous Q12H Howerter, Justin B, DO   3 mL at 09/01/19 0852  . TPN ADULT (ION)   Intravenous Continuous TPN Rolla Flatten, RPH 110 mL/hr at 09/01/19 1000 Rate Verify at 09/01/19 1000  . TPN ADULT (ION)   Intravenous Continuous TPN Rolla Flatten, [facility]         Discharge Medications: Please see discharge summary for a list of discharge medications.  Relevant Imaging Results:  Relevant Lab Results:   Additional Information SSN [id number]  Neysa Hotter [place], [place]

## 2019-09-01 NOTE — Progress Notes (Signed)
PHARMACY - TOTAL PARENTERAL NUTRITION CONSULT NOTE   Indication: Prolonged ileus  Patient Measurements: Height: 6\' 2"  (188 cm) Weight: 105.3 kg (232 lb 2.3 oz) IBW/kg (Calculated) : 82.2 TPN AdjBW (KG): 96 Body mass index is 29.81 kg/m.  Assessment: 67 YOM who presents with abdominal pain, N/V found to have perforated colon and feculent peritonitis now s/p R hemicolectomy with end ileostomy. Given anticipated prolonged ileus with poor baseline nutritional status indicated by a prealbumin < 5, pharmacy consulted to start TPN while allowing for bowel rest.   Glucose / Insulin: A1c 6, CBGs 150-210 since increased TPN rate, SSI: 12 units/12h. On ICU sSSI  Electrolytes: Na 136, K 4.7, Mg 2.5, CO2 22, corrCa 10, Phos 4.2 - will continue with reduced K/Mg/Phos in TPN in the setting of AKI and uptrending levels.  Renal: AKI with SCr up to 3.93 << 3.76 (BL 1-1.3), UOP up to 0.2 ml/kg/hr on lasix 80 IV q12h LFTs / TGs: LFTs down to 156/109, Tbili wnl Prealbumin / albumin: Pre-albumin < 5, albumin 1.4 Intake / Output; MIVF: UOP 0.2 ml/kg/hr, NGT output/24h: ~110cc, per surgery 6/18 to attempt to clamp and try sips of clears  GI Imaging: - 6/14: Free air in the abdomen consistent with bowel perforation ID: Zosyn [id number] for intra-abdominal infection  Surgeries / Procedures:  - 6/15: R hemicolectomy with end ileostomy   Central access: DL CVC TPN start date: 6/15 >>   Nutritional Goals : Per RD recommendations on 6/16: 2400-2600 kcal, 125-150g protein, >2.4L/day  Estimated goal rate of 110 ml/hr will provide 150g protein, 356g CHO, 77g lipids providing 2579 kcal  Current Nutrition:  NPO  Plan:  Continue TPN at 110 ml/hr to provide 150g protein, 356g CHO, 77g lipid and ~2579 kcal to meet ~100% of patient's estimated needs Electrolytes in TPN: increase to 70mEq/L of Na, continue with 67mEq/L of K, 91mEq/L of Ca, continue with 33mEq/L of Mg, continue with 16mmol/L of Phos, max acetate Add standard  MVI and trace elements to TPN Increase to 30 units of insulin R to TPN bag Continue ICU SSI q4h and adjust as needed Monitor TPN labs on Mon/Thurs, BMET, Mg/Phos in AM  Thank you for allowing pharmacy to be a part of this patient's care.  Alycia Rossetti, PharmD, BCPS Clinical Pharmacist 09/01/2019 7:18 AM   **Pharmacist phone directory can now be found on [email or web address] ([email or web address]).  Listed under [place].

## 2019-09-01 NOTE — Progress Notes (Signed)
NAME:  Alan Orr, MRN:  [id number], DOB:  01-Dec-1945, LOS: 4 ADMISSION DATE:  09/12/2019, CONSULTATION DATE: 6/15 REFERRING MD: Dr. Broadus John, CHIEF COMPLAINT: Shock  Brief History   74 year old male admitted with acute abdomen 6/14 taken emergently to the OR for right hemicolectomy with end ileostomy.  Postoperatively he was hypotensive despite IV fluid resuscitation and was transferred to ICU for further management.  History of present illness   74 year old male with past medical history as below, which is significant for coronary artery disease status post CABG, atrial fibrillation on Pradaxa, VT status post AICD, CKD 3. He lives alone and is fairly independent per family (still changes his own motor oil). Recently admitted to [facility] after fall for R humerus fracture. There was concern for polypharmacy and for inability to self care so he was discharged to SNF. He presented to [facility] emergency department on 6/14 with complaints of abdominal distention and tenderness x24 hours.  CT imaging of the abdomen demonstrated bowel perforation general surgery at any [facility] recommended transfer to [facility].  Upon arrival to [facility] he was taken emergently to the operating room by Dr. Kieth Brightly where he underwent right hemicolectomy with end ileostomy.  Fascia was closed in the OR soft tissue remained open and surgically packed and dressed.  Postoperative he developed progressive hypotension despite almost 5 L of IV fluids since the time of admission.  PCCM was consulted for possible pressor need.  Past Medical History   has a past medical history of CAD (coronary artery disease), DM (diabetes mellitus) ([place]), DVT (deep venous thrombosis) ([place]), Hyperlipidemia, Hypertension, ICD (implantable cardioverter-defibrillator), single, in situ, Ischemic cardiomyopathy, PAF (paroxysmal atrial fibrillation) ([place]), RBBB, and S/P CABG x 5.   Significant Hospital Events   6/14: Admit, 2 OR for  hemicolectomy  Consults:  General surgery  Procedures:  6/14: Right IJ double-lumen central line  Significant Diagnostic Tests:  CT abdomen, pelvis 6/14> Extensive free air in the abdomen consistent with bowel perforation.  Tiny bilateral pleural effusions.  ECHO 6/16 > 1. There is a 2.8 x 1.4 cm mobile protruding thrombus in the LV apex.  Left ventricular ejection fraction, by estimation, is 60 to 65%. The left  ventricle has normal function. The left ventricle has no regional wall  motion abnormalities. Left ventricular  diastolic parameters are consistent with Grade I diastolic dysfunction  (impaired relaxation). There is akinesis of the left ventricular, entire  apical segment.  2. Right ventricular systolic function is normal. The right ventricular  size is normal. Tricuspid regurgitation signal is inadequate for assessing  PA pressure.  3. The mitral valve is normal in structure. No evidence of mitral valve  regurgitation. No evidence of mitral stenosis.  4. The aortic valve was not well visualized. Aortic valve regurgitation  is not visualized. No aortic stenosis is present.  5. The inferior vena cava is normal in size with <50% respiratory  variability, suggesting right atrial pressure of 8 mmHg.  6. Report of LV apical thrombus called to Dr. Sigmund Hazel Data:  Blood 6/14 > NGTD  Antimicrobials:  Zosyn 6/14>   Interim history/subjective:   Lying in bed in no acute distress, RN reports minimal urine output overnight and wheeping of upper extremities.  Has not required vasopressors.  Nephrology to initiate trial of diuretics.    Objective   Blood pressure (!) 132/56, pulse (!) 52, temperature 98.8 F (37.1 C), temperature source Axillary, resp. rate (!) 26, height 6\' 2"  (  1.88 m), weight 108.4 kg, SpO2 100 %.        Intake/Output Summary (Last 24 hours) at 09/01/2019 0933 Last data filed at 09/01/2019 0800 Gross per 24 hour  Intake 3181.89 ml  Output  658 ml  Net 2523.89 ml   Filed Weights   08/29/19 0500 08/31/19 0500 09/01/19 0800  Weight: 97 kg 105.3 kg 108.4 kg    Examination: General: Chronically ill appearing deconditioned elderly male lying in bed, in NAD HEENT: [place]/AT, MM pink/moist, PERRL, NG tube in place with minimal output Neuro: Alert and oriented x3, non-focal mumbling speech. CV: s1s2 regular rate and rhythm, no murmur, rubs, or gallops,  PULM:  Clear to ascultation bilaterally, no added breath sounds, no increased work of breathing  GI: soft, bowel sounds hypoactive in all 4 quadrants, tender to palpation, ostomy site clean no stool output, ABD dressing /D/I Extremities: warm/dry, 3+ pitting edema in upper and lower extremities, right arm in sling  Skin: no rashes or lesions  Resolved Hospital Problem list   Hypokalemia   Assessment & Plan:   Was critically ill due to septic shock secondary to peritonitis in the setting of bowel perforation Delayed return of gastrointestinal function requiring TPN S/p right hemicolectomy with end ileostomy  Acute kidney injury superimposed on CKD 3 Mobile LV thrombus  Atrial fibrillation Ventricular tachycardia status post ICD CAD s/p CABG x  DM2 Right humerus fractureStatus post fall 10 days prior to arrival Elevated LFT's, improving Hypothyroidism  Plan:  Currently no longer in shock.  Not requiring CRRT at this time. Aggressive trial of diuresis as patient is markedly volume overloaded.  Will need to maintain at least 150 mL/h urine output to achieve negative fluid balance.  May require furosemide infusion. Continue TPN pending return of bowel function.  We will add insulin to TPN for hyperglycemia. Challenge GI tract with sips of clears Continue IV heparin for left ventricular thrombus.  Best practice:  Diet: N.p.o. Receiving TPN DVT prophylaxis: SCDs, lovenox GI prophylaxis: Pepcid Glucose control: CBG monitoring and SSI Mobility: Bedrest Code Status: Full  code Family Communication: Nephew updated this am.  Disposition: ICU  Labs   CBC: Recent Labs  Lab 08/27/2019 1642 08/29/19 0044 08/29/19 0549 08/29/19 1555 08/30/19 0512 08/31/19 0403 09/01/19 0405  WBC 11.8*  --  8.1 12.0* 14.6* 15.9* 15.1*  NEUTROABS 10.7*  --  7.2  --  13.6*  --   --   HGB 12.8*   < > 8.5* 9.6* 9.4* 9.1* 8.6*  HCT 38.3*   < > 25.9* 29.3* 28.9* 28.5* 27.4*  MCV 107.0*  --  106.6* 108.1* 108.2* 110.0* 111.4*  PLT 352  --  296 367 403* 360 331   < > = values in this interval not displayed.    Basic Metabolic Panel: Recent Labs  Lab 08/29/19 0549 08/29/19 1555 08/30/19 0512 08/31/19 0403 09/01/19 0405  NA 142 140 141 139 136  K 3.0* 4.2 4.2 4.6 4.7  CL 113* 111 110 109 106  CO2 20* 21* 22 21* 22  GLUCOSE 174* 162* 220* 181* 216*  BUN 29* 39* 48* 62* 83*  CREATININE 2.48* 2.94* 3.43* 3.76* 3.93*  CALCIUM 6.3* 7.1* 7.4* 7.7* 7.9*  MG 1.7  --  2.5* 2.8* 2.5*  PHOS  --   --  3.5 4.1 4.2   GFR: Estimated Creatinine Clearance: 21.6 mL/min (A) (by C-G formula based on SCr of 3.93 mg/dL (H)). Recent Labs  Lab 08/29/19 0549 08/29/19 0549 08/29/19 1555  08/30/19 0512 08/31/19 0403 09/01/19 0405  WBC 8.1   < > 12.0* 14.6* 15.9* 15.1*  LATICACIDVEN 2.1*  --  2.0*  --   --   --    < > = values in this interval not displayed.    Liver Function Tests: Recent Labs  Lab 08/23/2019 1740 08/29/19 0549 08/30/19 0512 08/31/19 0403  AST 116* 245* 220* 156*  ALT 60* 91* 115* 109*  ALKPHOS 96 60 73 111  BILITOT 1.1 1.2 0.9 0.6  PROT 5.7* 4.0* 4.6* 4.5*  ALBUMIN 2.0* 1.9* 1.8* 1.4*   Recent Labs  Lab 09/07/2019 1740  LIPASE 23   No results for input(s): AMMONIA in the last 168 hours.  ABG    Component Value Date/Time   PHART 7.368 08/29/2019 0044   PCO2ART 41.9 08/29/2019 0044   PO2ART 502 (H) 08/29/2019 0044   HCO3 25.1 08/29/2019 0044   TCO2 27 08/29/2019 0044   ACIDBASEDEF 1.0 08/29/2019 0044   O2SAT 100.0 08/29/2019 0044     Coagulation  Profile: Recent Labs  Lab 08/17/2019 1740  INR 1.8*    Cardiac Enzymes: No results for input(s): CKTOTAL, CKMB, CKMBINDEX, TROPONINI in the last 168 hours.  HbA1C: Hemoglobin-A1c  Date/Time Value Ref Range Status  12/09/2006 01:50 PM (H) <6.0 % Final   8.6 (NOTE)  Therapeutic target for the treatment of diabetes mellitus patients is <7% HbA1c.  American Diabetes Association Diabetes Care 2002;25:S33-S49.   Hgb A1c MFr Bld  Date/Time Value Ref Range Status  08/18/2019 11:54 AM 6.0 (H) 4.8 - 5.6 % Final    Comment:    (NOTE) Pre diabetes:          5.7%-6.4% Diabetes:              >6.4% Glycemic control for   <7.0% adults with diabetes   06/21/2018 01:38 PM 5.9 (H) 4.8 - 5.6 % Final    Comment:    (NOTE) Pre diabetes:          5.7%-6.4% Diabetes:              >6.4% Glycemic control for   <7.0% adults with diabetes     CBG: Recent Labs  Lab 08/31/19 1618 08/31/19 1933 09/01/19 0404 09/01/19 0611 09/01/19 0712  GLUCAP 149* 150* 202* 187* Fing[place], MD [facility] ICU Physician [place]  Pager: [phone] Mobile: [phone] After hours: [phone].  09/01/2019, 9:40 AM

## 2019-09-01 NOTE — Progress Notes (Signed)
Progress Note  Patient Name: Alan Orr Date of Encounter: 09/01/2019  [facility] Cardiologist: Sanda Klein, MD   Subjective   Pt moved to 57M unit. Leg pain better today. No CP today.  Inpatient Medications    Scheduled Meds: . chlorhexidine  15 mL Mouth Rinse BID  . Chlorhexidine Gluconate Cloth  6 each Topical Daily  . insulin aspart  2-6 Units Subcutaneous Q4H  . levothyroxine  75 mcg Intravenous Daily  . mouth rinse  15 mL Mouth Rinse q12n4p  . mouth rinse  15 mL Mouth Rinse BID  . sodium chloride flush  10-40 mL Intracatheter Q12H  . sodium chloride flush  3 mL Intravenous Q12H   Continuous Infusions: . sodium chloride Stopped (09/01/19 0949)  . dextrose 5% lactated ringers Stopped (08/31/19 2127)  . famotidine (PEPCID) IV Stopped (09/01/19 1019)  . furosemide (LASIX) infusion 10 mg/hr (09/01/19 1828)  . heparin 1,250 Units/hr (09/01/19 1800)  . methocarbamol (ROBAXIN) IV    . piperacillin-tazobactam (ZOSYN)  IV 12.5 mL/hr at 09/01/19 1800  . TPN ADULT (ION) 110 mL/hr at 09/01/19 1800   PRN Meds: sodium chloride, fentaNYL (SUBLIMAZE) injection, methocarbamol (ROBAXIN) IV, ondansetron (ZOFRAN) IV, sodium chloride flush   Vital Signs    Vitals:   09/01/19 1600 09/01/19 1700 09/01/19 1800 09/01/19 1900  BP: (!) 126/51 (!) 108/47 (!) 113/49 (!) 113/47  Pulse: 82 74 81 89  Resp: (!) 24 20 (!) 21 20  Temp:      TempSrc:      SpO2: 100% 100% 100% 100%  Weight:      Height:        Intake/Output Summary (Last 24 hours) at 09/01/2019 2024 Last data filed at 09/01/2019 2000 Gross per 24 hour  Intake 3014.09 ml  Output 930 ml  Net 2084.09 ml   Last 3 Weights 09/01/2019 08/31/2019 08/29/2019  Weight (lbs) 238 lb 15.7 oz 232 lb 2.3 oz 213 lb 13.5 oz  Weight (kg) 108.4 kg 105.3 kg 97 kg      Telemetry    V pacing - Personally Reviewed  ECG    No new - Personally Reviewed  Physical Exam   GEN: No acute distress.   Neck: No JVD Cardiac: RRR, no  murmurs, rubs, or gallops.  Respiratory: coarse upper respiratory sounds. GI: open abdomen, packed. MS: 2-3+ edema; No deformity. Neuro:  unable to understand his questions today but does not seem to have a focal deficit Psych: calm   Labs    High Sensitivity Troponin:   Recent Labs  Lab 08/15/19 1545 08/15/19 1732 08/17/19 2004 08/17/19 2148 08/30/19 1322  TROPONINIHS 13 13 19* 20* 67*      Chemistry Recent Labs  Lab 08/29/19 0549 08/29/19 1555 08/30/19 0512 08/31/19 0403 09/01/19 0405  NA 142   < > 141 139 136  K 3.0*   < > 4.2 4.6 4.7  CL 113*   < > 110 109 106  CO2 20*   < > 22 21* 22  GLUCOSE 174*   < > 220* 181* 216*  BUN 29*   < > 48* 62* 83*  CREATININE 2.48*   < > 3.43* 3.76* 3.93*  CALCIUM 6.3*   < > 7.4* 7.7* 7.9*  PROT 4.0*  --  4.6* 4.5*  --   ALBUMIN 1.9*  --  1.8* 1.4*  --   AST 245*  --  220* 156*  --   ALT 91*  --  115* 109*  --  ALKPHOS 60  --  73 111  --   BILITOT 1.2  --  0.9 0.6  --   GFRNONAA 25*   < > 17* 15* 14*  GFRAA 29*   < > 19* 17* 16*  ANIONGAP 9   < > 9 9 8    < > = values in this interval not displayed.     Hematology Recent Labs  Lab 08/30/19 0512 08/31/19 0403 09/01/19 0405  WBC 14.6* 15.9* 15.1*  RBC 2.67* 2.59* 2.46*  HGB 9.4* 9.1* 8.6*  HCT 28.9* 28.5* 27.4*  MCV 108.2* 110.0* 111.4*  MCH 35.2* 35.1* 35.0*  MCHC 32.5 31.9 31.4  RDW 16.5* 16.9* 17.2*  PLT 403* 360 331    BNPNo results for input(s): BNP, PROBNP in the last 168 hours.   DDimer No results for input(s): DDIMER in the last 168 hours.   Radiology    No results found.  Cardiac Studies   Echo demonstrating apical akinesis, EF ~ 50% and large LV apical thrombus  Patient Profile     HARRELL NIEHOFF is a 74 y.o. male with a history of CAD s/p remote CABG x5 in 1997, ischemic cardiomyopathy with improved EF of 50-55% on last Echo in 2016 s/p Medtronic CRT-D, paroxysmal atrial fibrillation on Amiodarone and Pradaxa, hypertension, hyperlipidemia,  type 2 diabetes mellitus, and prior DVT who is being seen for the evaluation of LV thrombus.  Assessment & Plan    1. 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 Apical akinesis is likely chronic, possibly since 2014. - on IV heparin. May need transition to warfarin given data for LV thrombus and DOAC. Discuss with pharmacist.  2. PAF - currently V paced with underlying sinus rhythm. Pradaxa held for surgery and reversed with praxbind. On IV heparin for #1  3. Ischemic CM with CRT-D 4. 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 - resume amiodarone when tolerated hemodynamically.   5. AKI and decreased urine output - nephrology consult suggest ischemic ATN -lasix challenge planned, dosing per nephrology.  Care and plan discussed with patient's nurse.    CRITICAL CARE Performed by: Cherlynn Kaiser, MD   Total critical care time: 35 minutes   Critical care time was exclusive of separately billable procedures and treating other patients.   Critical care was necessary to treat or prevent imminent or life-threatening deterioration.   Critical care was time spent personally by me (independent of APPs or residents) on the following activities: development of treatment plan with patient and/or surrogate as well as nursing, discussions with consultants, evaluation of patient's response to treatment, examination of patient, obtaining history from patient or surrogate, ordering and performing treatments and interventions, ordering and review of laboratory studies, ordering and review of radiographic studies, pulse oximetry and re-evaluation of patient's condition.   For questions or updates, please contact [place] Please consult [email or web address] for contact info under        Signed, Elouise Munroe, MD

## 2019-09-01 NOTE — Consult Note (Signed)
[place] Nurse ostomy follow up Patient receiving care in [facility] 2M01. Pouch placed 08/30/19 intact, no washout, only containing approximately 25 ml of serosanginous drainage, no flatus.Stoma red, moist. Supplies in room.  Pouch not changed. Will follow up next week. Val Riles, RN, MSN, CWOCN, CNS-BC, pager [phone]

## 2019-09-01 NOTE — Progress Notes (Signed)
[facility] ROUNDING NOTE   Subjective:   This is a 74 year old gentleman with history of diabetes hypertension coronary artery status post CABG chronic atrial fibrillation ICD in place.  He has a recent history of a fall  with a right humeral fracture and was placed in SNF.  He was brought to the emergency room 08/28/2018 with abdominal pain found to have bowel perforation peritonitis undergoing right hemicolectomy and end ileostomy.  He had some transient hypotension requiring pressors and was placed in the intensive care unit.  He was taking an ACE inhibitor ramipril 2.5 mg prior to admission.  His baseline serum creatinine appears to be about 1.3 mg/dL.  CT scan abdomen and pelvis performed 08/26/2019 did not reveal any evidence of hydronephrosis.  He had a urinalysis performed 08/18/2019 that showed 11-20 RBCs and no evidence of proteinuria.  He was found to have a left atrial thrombus on 2D echo 08/30/2019 which is 2.8 x 1.4 cm mobile protruding thrombus in the left ventricular apex with normal ejection fraction of 75-91% with diastolic dysfunction.  Blood pressure 117/44 pulse 83 temperature 98.2 O2 sats 97% 2 L nasal cannula.  Urine output 441 cc 08/31/2019.  Sodium 136 potassium 4.7 chloride 106 CO2 22 BUN 83 creatinine 3.93 glucose 216 calcium 7.9 phosphorus 4.2 albumin 1.4 magnesium 2.5 WBC 15.1 hemoglobin 8.6.  Insulin sliding scale, levothyroxine 75 mcg IV daily, IV heparin  IV Zosyn 3.375 g every 8 hours  Objective:  Vital signs in last 24 hours:  Temp:  [97.5 F (36.4 C)-98.9 F (37.2 C)] 98.8 F (37.1 C) (06/18 0000) Pulse Rate:  [77-98] 97 (06/18 0630) Resp:  [10-28] 19 (06/18 0630) BP: (107-168)/(42-117) 117/44 (06/18 0630) SpO2:  [96 %-100 %] 97 % (06/18 0630)  Weight change:  Filed Weights   08/29/19 0355 08/29/19 0500 08/31/19 0500  Weight: 97 kg 97 kg 105.3 kg    Intake/Output: I/O last 3 completed shifts: In: 6384 [I.V.:4726.9; IV  Piggyback:1723.1] Out: 69 [Urine:696; Emesis/NG output:290]   Intake/Output this shift:  Total I/O In: 1583.5 [I.V.:1507.3; IV Piggyback:76.1] Out: 275 [Urine:275]     General: White male, actually appears older than his stated age.  He is alert, minimal distress but is having abdominal pain HEENT: Pupils are equal round react to light, extra motions are intact, mucous membranes are moist Neck: JVD is present Heart: Regular rate and rhythm Lungs: Decreased breath sounds at extreme bases Abdomen: Postoperative, minimal bowel sounds, slightly distended and tender Extremities: 2+ pitting edema throughout Skin: Warm and dry Neuro: Alert, nonfocal   Basic Metabolic Panel: Recent Labs  Lab 08/29/19 0549 08/29/19 0549 08/29/19 1555 08/29/19 1555 08/30/19 0512 08/31/19 0403 09/01/19 0405  NA 142  --  140  --  141 139 136  K 3.0*  --  4.2  --  4.2 4.6 4.7  CL 113*  --  111  --  110 109 106  CO2 20*  --  21*  --  22 21* 22  GLUCOSE 174*  --  162*  --  220* 181* 216*  BUN 29*  --  39*  --  48* 62* 83*  CREATININE 2.48*  --  2.94*  --  3.43* 3.76* 3.93*  CALCIUM 6.3*   < > 7.1*   < > 7.4* 7.7* 7.9*  MG 1.7  --   --   --  2.5* 2.8* 2.5*  PHOS  --   --   --   --  3.5 4.1 4.2   < > =  values in this interval not displayed.    Liver Function Tests: Recent Labs  Lab 08/19/2019 1740 08/29/19 0549 08/30/19 0512 08/31/19 0403  AST 116* 245* 220* 156*  ALT 60* 91* 115* 109*  ALKPHOS 96 60 73 111  BILITOT 1.1 1.2 0.9 0.6  PROT 5.7* 4.0* 4.6* 4.5*  ALBUMIN 2.0* 1.9* 1.8* 1.4*   Recent Labs  Lab 08/15/2019 1740  LIPASE 23   No results for input(s): AMMONIA in the last 168 hours.  CBC: Recent Labs  Lab 09/02/2019 1642 08/29/19 0044 08/29/19 0549 08/29/19 1555 08/30/19 0512 08/31/19 0403 09/01/19 0405  WBC 11.8*  --  8.1 12.0* 14.6* 15.9* 15.1*  NEUTROABS 10.7*  --  7.2  --  13.6*  --   --   HGB 12.8*   < > 8.5* 9.6* 9.4* 9.1* 8.6*  HCT 38.3*   < > 25.9* 29.3* 28.9* 28.5*  27.4*  MCV 107.0*  --  106.6* 108.1* 108.2* 110.0* 111.4*  PLT 352  --  296 367 403* 360 331   < > = values in this interval not displayed.    Cardiac Enzymes: No results for input(s): CKTOTAL, CKMB, CKMBINDEX, TROPONINI in the last 168 hours.  BNP: Invalid input(s): POCBNP  CBG: Recent Labs  Lab 08/31/19 1130 08/31/19 1618 08/31/19 1933 09/01/19 0404 09/01/19 0611  GLUCAP 167* 149* 150* 40* 187*    Microbiology: Results for orders placed or performed during the hospital encounter of 09/02/2019  SARS Coronavirus 2 by RT PCR (hospital order, performed in [facility] hospital lab) Nasopharyngeal Nasopharyngeal Swab     Status: None   Collection Time: 08/26/2019  7:00 PM   Specimen: Nasopharyngeal Swab  Result Value Ref Range Status   SARS Coronavirus 2 NEGATIVE NEGATIVE Final    Comment: (NOTE) SARS-CoV-2 target nucleic acids are NOT DETECTED.  The SARS-CoV-2 RNA is generally detectable in upper and lower respiratory specimens during the acute phase of infection. The lowest concentration of SARS-CoV-2 viral copies this assay can detect is 250 copies / mL. A negative result does not preclude SARS-CoV-2 infection and should not be used as the sole basis for treatment or other patient management decisions.  A negative result may occur with improper specimen collection / handling, submission of specimen other than nasopharyngeal swab, presence of viral mutation(s) within the areas targeted by this assay, and inadequate number of viral copies (<250 copies / mL). A negative result must be combined with clinical observations, patient history, and epidemiological information.  Fact Sheet for Patients:   StrictlyIdeas.no  Fact Sheet for Healthcare Providers: BankingDealers.co.za  This test is not yet approved or  cleared by the [place] FDA and has been authorized for detection and/or diagnosis of SARS-CoV-2 by FDA under an  Emergency Use Authorization (EUA).  This EUA will remain in effect (meaning this test can be used) for the duration of the COVID-19 declaration under Section 564(b)(1) of the Act, 21 U.S.C. section 360bbb-3(b)(1), unless the authorization is terminated or revoked sooner.  Performed at [facility], [address] [place], [place] [address]   Culture, blood (Routine X 2) w Reflex to ID Panel     Status: None (Preliminary result)   Collection Time: 08/29/19  5:41 AM   Specimen: BLOOD LEFT HAND  Result Value Ref Range Status   Specimen Description BLOOD LEFT HAND  Final   Special Requests   Final    BOTTLES DRAWN AEROBIC ONLY Blood Culture adequate volume   Culture   Final  NO GROWTH 2 DAYS Performed at [facility], [place] [address] [place], [place] [address]    Report Status PENDING  Incomplete  Culture, blood (Routine X 2) w Reflex to ID Panel     Status: None (Preliminary result)   Collection Time: 08/29/19  5:49 AM   Specimen: BLOOD  Result Value Ref Range Status   Specimen Description BLOOD SITE NOT SPECIFIED  Final   Special Requests   Final    BOTTLES DRAWN AEROBIC ONLY Blood Culture results may not be optimal due to an inadequate volume of blood received in culture bottles   Culture   Final    NO GROWTH 2 DAYS Performed at [facility], [place] [address], [place], [place] [address]    Report Status PENDING  Incomplete    Coagulation Studies: No results for input(s): LABPROT, INR in the last 72 hours.  Urinalysis: No results for input(s): COLORURINE, LABSPEC, PHURINE, GLUCOSEU, HGBUR, BILIRUBINUR, KETONESUR, PROTEINUR, UROBILINOGEN, NITRITE, LEUKOCYTESUR in the last 72 hours.  Invalid input(s): APPERANCEUR    Imaging: ECHOCARDIOGRAM COMPLETE  Result Date: 08/30/2019    ECHOCARDIOGRAM REPORT   Patient Name:   Alan Orr Date of Exam: 08/30/2019 Medical Rec #:  [id number]        Height:       74.0 in Accession #:    [id number]       Weight:       213.8  lb Date of Birth:  10-Nov-1945        BSA:          2.236 m Patient Age:    71 years         BP:           118/55 mmHg Patient Gender: M                HR:           78 bpm. Exam Location:  Inpatient Procedure: 2D Echo, Color Doppler, Cardiac Doppler, Intracardiac Opacification            Agent and 3D Echo Indications:    Elevated Troponins  History:        Patient has prior history of Echocardiogram examinations, most                 recent 01/11/2015. Pacemaker, Defibrillator and Prior CABG,                 Arrythmias:Atrial Fibrillation; Risk Factors:Hypertension,                 Diabetes and Dyslipidemia.  Sonographer:    Raquel Sarna Senior RDCS Referring Phys: [phone] [facility]  Sonographer Comments: Scanned supine due to arm fracture. IMPRESSIONS  1. There is a 2.8 x 1.4 cm mobile protruding thrombus in the LV apex. Left ventricular ejection fraction, by estimation, is 60 to 65%. The left ventricle has normal function. The left ventricle has no regional wall motion abnormalities. Left ventricular  diastolic parameters are consistent with Grade I diastolic dysfunction (impaired relaxation). There is akinesis of the left ventricular, entire apical segment.  2. Right ventricular systolic function is normal. The right ventricular size is normal. Tricuspid regurgitation signal is inadequate for assessing PA pressure.  3. The mitral valve is normal in structure. No evidence of mitral valve regurgitation. No evidence of mitral stenosis.  4. The aortic valve was not well visualized. Aortic valve regurgitation is not visualized. No aortic stenosis is present.  5.  The inferior vena cava is normal in size with <50% respiratory variability, suggesting right atrial pressure of 8 mmHg.  6. Report of LV apical thrombus called to Dr. Chase Caller FINDINGS  Left Ventricle: There is a 2.8 x 1.4 cm mobile protruding thrombus in the LV apex. Left ventricular ejection fraction, by estimation, is 60 to 65%. The left ventricle has normal  function. The left ventricle has no regional wall motion abnormalities. Definity contrast agent was given IV to delineate the left ventricular endocardial borders. The left ventricular internal cavity size was normal in size. There is no left ventricular hypertrophy. Left ventricular diastolic parameters are consistent with Grade I diastolic dysfunction (impaired relaxation). Normal left ventricular filling pressure. Right Ventricle: The right ventricular size is normal. No increase in right ventricular wall thickness. Right ventricular systolic function is normal. Tricuspid regurgitation signal is inadequate for assessing PA pressure. Left Atrium: Left atrial size was normal in size. Right Atrium: Right atrial size was normal in size. Pericardium: There is no evidence of pericardial effusion. Mitral Valve: The mitral valve is normal in structure. Normal mobility of the mitral valve leaflets. No evidence of mitral valve regurgitation. No evidence of mitral valve stenosis. Tricuspid Valve: The tricuspid valve is normal in structure. Tricuspid valve regurgitation is not demonstrated. No evidence of tricuspid stenosis. Aortic Valve: The aortic valve was not well visualized. Aortic valve regurgitation is not visualized. No aortic stenosis is present. Pulmonic Valve: The pulmonic valve was normal in structure. Pulmonic valve regurgitation is not visualized. No evidence of pulmonic stenosis. Aorta: The aortic root is normal in size and structure. Venous: The inferior vena cava is normal in size with less than 50% respiratory variability, suggesting right atrial pressure of 8 mmHg. IAS/Shunts: No atrial level shunt detected by color flow Doppler. Additional Comments: A pacer wire is visualized.  LEFT VENTRICLE PLAX 2D LVIDd:         5.40 cm  Diastology LVIDs:         4.10 cm  LV e' lateral:   8.59 cm/s LV PW:         0.90 cm  LV E/e' lateral: 7.9 LV IVS:        0.90 cm  LV e' medial:    6.96 cm/s LVOT diam:     2.00 cm  LV  E/e' medial:  9.7 LV SV:         58 LV SV Index:   26 LVOT Area:     3.14 cm  RIGHT VENTRICLE RV S prime:     7.72 cm/s TAPSE (M-mode): 1.6 cm LEFT ATRIUM           Index       RIGHT ATRIUM           Index LA diam:      3.50 cm 1.57 cm/m  RA Area:     12.50 cm LA Vol (A4C): 75.7 ml 33.85 ml/m RA Volume:   25.60 ml  11.45 ml/m  AORTIC VALVE LVOT Vmax:   85.50 cm/s LVOT Vmean:  65.800 cm/s LVOT VTI:    0.186 m  AORTA Ao Root diam: 3.40 cm MITRAL VALVE MV Area (PHT): 3.34 cm    SHUNTS MV Decel Time: 227 msec    Systemic VTI:  0.19 m MV E velocity: 67.70 cm/s  Systemic Diam: 2.00 cm MV A velocity: 81.00 cm/s MV E/A ratio:  0.84 Fransico Him MD Electronically signed by Fransico Him MD Signature Date/Time: 08/30/2019/4:44:51 PM  Final      Medications:   . dextrose 5% lactated ringers Stopped (08/31/19 2127)  . famotidine (PEPCID) IV Stopped (08/31/19 1014)  . heparin 1,300 Units/hr (09/01/19 0500)  . methocarbamol (ROBAXIN) IV    . piperacillin-tazobactam (ZOSYN)  IV 12.5 mL/hr at 09/01/19 0500  . TPN ADULT (ION) 110 mL/hr at 09/01/19 0500   . chlorhexidine  15 mL Mouth Rinse BID  . Chlorhexidine Gluconate Cloth  6 each Topical Daily  . insulin aspart  2-6 Units Subcutaneous Q4H  . levothyroxine  75 mcg Intravenous Daily  . mouth rinse  15 mL Mouth Rinse q12n4p  . sodium chloride flush  10-40 mL Intracatheter Q12H  . sodium chloride flush  3 mL Intravenous Q12H   fentaNYL (SUBLIMAZE) injection, methocarbamol (ROBAXIN) IV, ondansetron (ZOFRAN) IV, sodium chloride flush  Assessment/ Plan:   Acute kidney injury.  Baseline serum creatinine 1.3 status post abdominal surgery with hemodynamic instability.  CT scan did not reveal any evidence of hydronephrosis.  His most recent urinalysis did not reveal any proteinuria but did have some microscopic hematuria.  Not thought to be a great candidate for long-term dialysis.  Has some marginal urine output.  Patient was on ACE inhibitor ramipril as an  outpatient.  Prior to admission.  Etiology of his acute kidney injury appears to be ischemic ATN.  Continue to avoid nephrotoxins ACE inhibitor's ARB, IV contrast.  Renally adjust medications for acute kidney injury  Hypertension/volume blood pressure appears relatively soft has some edema.  Appears to be in positive fluid balance we will challenge with Lasix IV 80 mg every 12 hours 09/01/2019.  Anemia secondary to blood loss from surgery.  Continue to follow no interventions at this time.  Left atrial thrombus on heparin drip history of atrial fibrillation  Status post hemicolectomy and end ileostomy 08/22/2019.   LOS: [place] @TODAY @6 :57 AM

## 2019-09-01 NOTE — Progress Notes (Signed)
Progress Note  Patient Name: Alan Orr Date of Encounter: 09/01/2019  [facility] Cardiologist: Sanda Klein, MD   Subjective   Bothered by leg pain and aching. Notes chest soreness for several weeks.  Inpatient Medications    Scheduled Meds: . chlorhexidine  15 mL Mouth Rinse BID  . Chlorhexidine Gluconate Cloth  6 each Topical Daily  . insulin aspart  2-6 Units Subcutaneous Q4H  . levothyroxine  75 mcg Intravenous Daily  . mouth rinse  15 mL Mouth Rinse q12n4p  . sodium chloride flush  10-40 mL Intracatheter Q12H  . sodium chloride flush  3 mL Intravenous Q12H   Continuous Infusions: . dextrose 5% lactated ringers Stopped (08/31/19 2127)  . famotidine (PEPCID) IV Stopped (08/31/19 1014)  . heparin 1,300 Units/hr (09/01/19 0500)  . methocarbamol (ROBAXIN) IV    . piperacillin-tazobactam (ZOSYN)  IV 12.5 mL/hr at 09/01/19 0500  . TPN ADULT (ION) 110 mL/hr at 09/01/19 0500   PRN Meds: fentaNYL (SUBLIMAZE) injection, methocarbamol (ROBAXIN) IV, ondansetron (ZOFRAN) IV, sodium chloride flush   Vital Signs    Vitals:   09/01/19 0200 09/01/19 0300 09/01/19 0400 09/01/19 0500  BP: (!) 133/56 134/63 139/66 135/61  Pulse: 96 88 85 84  Resp: (!) 25 (!) 24 (!) 24 (!) 24  Temp:      TempSrc:      SpO2: 97% 97% 97% 97%  Weight:      Height:        Intake/Output Summary (Last 24 hours) at 09/01/2019 0555 Last data filed at 09/01/2019 0500 Gross per 24 hour  Intake 5529.1 ml  Output 466 ml  Net 5063.1 ml   Last 3 Weights 08/31/2019 08/29/2019 08/29/2019  Weight (lbs) 232 lb 2.3 oz 213 lb 13.5 oz 213 lb 13.5 oz  Weight (kg) 105.3 kg 97 kg 97 kg      Telemetry    V pacin - Personally Reviewed  ECG    No new - Personally Reviewed  Physical Exam   GEN: No acute distress.   Neck: No JVD Cardiac: RRR, no murmurs Respiratory: coarse bilaterally GI: abdomen incision packed, distended not tense MS: diffuse 2+ edema; No deformity. Neuro:  Nonfocal  Psych:  Normal affect   Labs    High Sensitivity Troponin:   Recent Labs  Lab 08/15/19 1545 08/15/19 1732 08/17/19 2004 08/17/19 2148 08/30/19 1322  TROPONINIHS 13 13 19* 20* 67*      Chemistry Recent Labs  Lab 08/29/19 0549 08/29/19 1555 08/30/19 0512 08/31/19 0403 09/01/19 0405  NA 142   < > 141 139 136  K 3.0*   < > 4.2 4.6 4.7  CL 113*   < > 110 109 106  CO2 20*   < > 22 21* 22  GLUCOSE 174*   < > 220* 181* 216*  BUN 29*   < > 48* 62* 83*  CREATININE 2.48*   < > 3.43* 3.76* 3.93*  CALCIUM 6.3*   < > 7.4* 7.7* 7.9*  PROT 4.0*  --  4.6* 4.5*  --   ALBUMIN 1.9*  --  1.8* 1.4*  --   AST 245*  --  220* 156*  --   ALT 91*  --  115* 109*  --   ALKPHOS 60  --  73 111  --   BILITOT 1.2  --  0.9 0.6  --   GFRNONAA 25*   < > 17* 15* 14*  GFRAA 29*   < > 19* 17*  16*  ANIONGAP 9   < > 9 9 8    < > = values in this interval not displayed.     Hematology Recent Labs  Lab 08/30/19 0512 08/31/19 0403 09/01/19 0405  WBC 14.6* 15.9* 15.1*  RBC 2.67* 2.59* 2.46*  HGB 9.4* 9.1* 8.6*  HCT 28.9* 28.5* 27.4*  MCV 108.2* 110.0* 111.4*  MCH 35.2* 35.1* 35.0*  MCHC 32.5 31.9 31.4  RDW 16.5* 16.9* 17.2*  PLT 403* 360 331    BNPNo results for input(s): BNP, PROBNP in the last 168 hours.   DDimer No results for input(s): DDIMER in the last 168 hours.   Radiology    ECHOCARDIOGRAM COMPLETE  Result Date: 08/30/2019    ECHOCARDIOGRAM REPORT   Patient Name:   KAMUELA MAGOS Date of Exam: 08/30/2019 Medical Rec #:  [id number]        Height:       74.0 in Accession #:    [id number]       Weight:       213.8 lb Date of Birth:  1945/10/12        BSA:          2.236 m Patient Age:    14 years         BP:           118/55 mmHg Patient Gender: M                HR:           78 bpm. Exam Location:  Inpatient Procedure: 2D Echo, Color Doppler, Cardiac Doppler, Intracardiac Opacification            Agent and 3D Echo Indications:    Elevated Troponins  History:        Patient has prior history of  Echocardiogram examinations, most                 recent 01/11/2015. Pacemaker, Defibrillator and Prior CABG,                 Arrythmias:Atrial Fibrillation; Risk Factors:Hypertension,                 Diabetes and Dyslipidemia.  Sonographer:    Raquel Sarna Senior RDCS Referring Phys: [phone] [facility]  Sonographer Comments: Scanned supine due to arm fracture. IMPRESSIONS  1. There is a 2.8 x 1.4 cm mobile protruding thrombus in the LV apex. Left ventricular ejection fraction, by estimation, is 60 to 65%. The left ventricle has normal function. The left ventricle has no regional wall motion abnormalities. Left ventricular  diastolic parameters are consistent with Grade I diastolic dysfunction (impaired relaxation). There is akinesis of the left ventricular, entire apical segment.  2. Right ventricular systolic function is normal. The right ventricular size is normal. Tricuspid regurgitation signal is inadequate for assessing PA pressure.  3. The mitral valve is normal in structure. No evidence of mitral valve regurgitation. No evidence of mitral stenosis.  4. The aortic valve was not well visualized. Aortic valve regurgitation is not visualized. No aortic stenosis is present.  5. The inferior vena cava is normal in size with <50% respiratory variability, suggesting right atrial pressure of 8 mmHg.  6. Report of LV apical thrombus called to Dr. Chase Caller FINDINGS  Left Ventricle: There is a 2.8 x 1.4 cm mobile protruding thrombus in the LV apex. Left ventricular ejection fraction, by estimation, is 60 to 65%. The left ventricle has normal function. The left ventricle has no  regional wall motion abnormalities. Definity contrast agent was given IV to delineate the left ventricular endocardial borders. The left ventricular internal cavity size was normal in size. There is no left ventricular hypertrophy. Left ventricular diastolic parameters are consistent with Grade I diastolic dysfunction (impaired relaxation). Normal  left ventricular filling pressure. Right Ventricle: The right ventricular size is normal. No increase in right ventricular wall thickness. Right ventricular systolic function is normal. Tricuspid regurgitation signal is inadequate for assessing PA pressure. Left Atrium: Left atrial size was normal in size. Right Atrium: Right atrial size was normal in size. Pericardium: There is no evidence of pericardial effusion. Mitral Valve: The mitral valve is normal in structure. Normal mobility of the mitral valve leaflets. No evidence of mitral valve regurgitation. No evidence of mitral valve stenosis. Tricuspid Valve: The tricuspid valve is normal in structure. Tricuspid valve regurgitation is not demonstrated. No evidence of tricuspid stenosis. Aortic Valve: The aortic valve was not well visualized. Aortic valve regurgitation is not visualized. No aortic stenosis is present. Pulmonic Valve: The pulmonic valve was normal in structure. Pulmonic valve regurgitation is not visualized. No evidence of pulmonic stenosis. Aorta: The aortic root is normal in size and structure. Venous: The inferior vena cava is normal in size with less than 50% respiratory variability, suggesting right atrial pressure of 8 mmHg. IAS/Shunts: No atrial level shunt detected by color flow Doppler. Additional Comments: A pacer wire is visualized.  LEFT VENTRICLE PLAX 2D LVIDd:         5.40 cm  Diastology LVIDs:         4.10 cm  LV e' lateral:   8.59 cm/s LV PW:         0.90 cm  LV E/e' lateral: 7.9 LV IVS:        0.90 cm  LV e' medial:    6.96 cm/s LVOT diam:     2.00 cm  LV E/e' medial:  9.7 LV SV:         58 LV SV Index:   26 LVOT Area:     3.14 cm  RIGHT VENTRICLE RV S prime:     7.72 cm/s TAPSE (M-mode): 1.6 cm LEFT ATRIUM           Index       RIGHT ATRIUM           Index LA diam:      3.50 cm 1.57 cm/m  RA Area:     12.50 cm LA Vol (A4C): 75.7 ml 33.85 ml/m RA Volume:   25.60 ml  11.45 ml/m  AORTIC VALVE LVOT Vmax:   85.50 cm/s LVOT Vmean:   65.800 cm/s LVOT VTI:    0.186 m  AORTA Ao Root diam: 3.40 cm MITRAL VALVE MV Area (PHT): 3.34 cm    SHUNTS MV Decel Time: 227 msec    Systemic VTI:  0.19 m MV E velocity: 67.70 cm/s  Systemic Diam: 2.00 cm MV A velocity: 81.00 cm/s MV E/A ratio:  0.84 Fransico Him MD Electronically signed by Fransico Him MD Signature Date/Time: 08/30/2019/4:44:51 PM    Final     Cardiac Studies   Echo demonstrating apical kinesis, EF ~ 50% and large LV apical thrombus  Patient Profile     SUMNER KIRCHMAN is a 73 y.o. male with a history of CAD s/p remote CABG x5 in 1997, ischemic cardiomyopathy with improved EF of 50-55% on last Echo in 2016 s/p Medtronic CRT-D, paroxysmal atrial fibrillation on Amiodarone and Pradaxa,  hypertension, hyperlipidemia, type 2 diabetes mellitus, and prior DVT who is being seen today for the evaluation of LV thrombus at the request of Dr. Chase Caller.  Assessment & Plan    1. 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 - on IV heparin. Consider transition to coumadin, will review with pharmacist.  2. PAF - currently V paced with underlying sinus rhythm. Pradaxa held for surgery and reversed with praxbind. On IV heparin for #1  3. Ischemic CM with CRT-D 4. 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 - resume amiodarone when tolerated.   5. AKI and decreased urine output - query third spacing in setting of sepsis. LVEF is borderline at 50%, but no definite signs of gross biventricular failure or cardiogenic shock. Suspect apical regional wall motion abnormalities are longstanding.  - nephrology consult today.  Care discussed with critical care team.     CRITICAL CARE Performed by: Cherlynn Kaiser, MD   Total critical care time: 32 minutes   Critical care time was exclusive of separately billable procedures and treating other patients.   Critical care was necessary to treat  or prevent imminent or life-threatening deterioration.   Critical care was time spent personally by me (independent of APPs or residents) on the following activities: development of treatment plan with patient and/or surrogate as well as nursing, discussions with consultants, evaluation of patient's response to treatment, examination of patient, obtaining history from patient or surrogate, ordering and performing treatments and interventions, ordering and review of laboratory studies, ordering and review of radiographic studies, pulse oximetry and re-evaluation of patient's condition.   For questions or updates, please contact [place] Please consult [email or web address] for contact info under        Signed, Elouise Munroe, MD

## 2019-09-02 ENCOUNTER — Inpatient Hospital Stay (HOSPITAL_COMMUNITY): Payer: Medicare Other

## 2019-09-02 DIAGNOSIS — I5043 Acute on chronic combined systolic (congestive) and diastolic (congestive) heart failure: Secondary | ICD-10-CM

## 2019-09-02 DIAGNOSIS — I472 Ventricular tachycardia: Secondary | ICD-10-CM

## 2019-09-02 LAB — CBC
HCT: 21.4 % — ABNORMAL LOW (ref 39.0–52.0)
Hemoglobin: 6.6 g/dL — CL (ref 13.0–17.0)
MCH: 34.6 pg — ABNORMAL HIGH (ref 26.0–34.0)
MCHC: 30.8 g/dL (ref 30.0–36.0)
MCV: 112 fL — ABNORMAL HIGH (ref 80.0–100.0)
Platelets: 276 10*3/uL (ref 150–400)
RBC: 1.91 MIL/uL — ABNORMAL LOW (ref 4.22–5.81)
RDW: 17.1 % — ABNORMAL HIGH (ref 11.5–15.5)
WBC: 18.5 10*3/uL — ABNORMAL HIGH (ref 4.0–10.5)
nRBC: 5.1 % — ABNORMAL HIGH (ref 0.0–0.2)

## 2019-09-02 LAB — POCT I-STAT 7, (LYTES, BLD GAS, ICA,H+H)
Acid-base deficit: 14 mmol/L — ABNORMAL HIGH (ref 0.0–2.0)
Bicarbonate: 13.8 mmol/L — ABNORMAL LOW (ref 20.0–28.0)
Calcium, Ion: 1.13 mmol/L — ABNORMAL LOW (ref 1.15–1.40)
HCT: 18 % — ABNORMAL LOW (ref 39.0–52.0)
Hemoglobin: 6.1 g/dL — CL (ref 13.0–17.0)
O2 Saturation: 98 %
Potassium: 5.1 mmol/L (ref 3.5–5.1)
Sodium: 139 mmol/L (ref 135–145)
TCO2: 15 mmol/L — ABNORMAL LOW (ref 22–32)
pCO2 arterial: 38.6 mmHg (ref 32.0–48.0)
pH, Arterial: 7.16 — CL (ref 7.350–7.450)
pO2, Arterial: 130 mmHg — ABNORMAL HIGH (ref 83.0–108.0)

## 2019-09-02 LAB — BASIC METABOLIC PANEL
Anion gap: 10 (ref 5–15)
BUN: 115 mg/dL — ABNORMAL HIGH (ref 8–23)
CO2: 21 mmol/L — ABNORMAL LOW (ref 22–32)
Calcium: 8 mg/dL — ABNORMAL LOW (ref 8.9–10.3)
Chloride: 105 mmol/L (ref 98–111)
Creatinine, Ser: 4.59 mg/dL — ABNORMAL HIGH (ref 0.61–1.24)
GFR calc Af Amer: 14 mL/min — ABNORMAL LOW (ref >60)
GFR calc non Af Amer: 12 mL/min — ABNORMAL LOW (ref >60)
Glucose, Bld: 236 mg/dL — ABNORMAL HIGH (ref 70–99)
Potassium: 4.9 mmol/L (ref 3.5–5.1)
Sodium: 136 mmol/L (ref 135–145)

## 2019-09-02 LAB — GLUCOSE, CAPILLARY
Glucose-Capillary: 162 mg/dL — ABNORMAL HIGH (ref 70–99)
Glucose-Capillary: 224 mg/dL — ABNORMAL HIGH (ref 70–99)
Glucose-Capillary: 227 mg/dL — ABNORMAL HIGH (ref 70–99)
Glucose-Capillary: 232 mg/dL — ABNORMAL HIGH (ref 70–99)
Glucose-Capillary: 237 mg/dL — ABNORMAL HIGH (ref 70–99)
Glucose-Capillary: 259 mg/dL — ABNORMAL HIGH (ref 70–99)

## 2019-09-02 LAB — LACTIC ACID, PLASMA: Lactic Acid, Venous: 9.9 mmol/L (ref 0.5–1.9)

## 2019-09-02 LAB — PREPARE RBC (CROSSMATCH)

## 2019-09-02 LAB — MAGNESIUM: Magnesium: 2.3 mg/dL (ref 1.7–2.4)

## 2019-09-02 LAB — HEPARIN LEVEL (UNFRACTIONATED): Heparin Unfractionated: 0.42 IU/mL (ref 0.30–0.70)

## 2019-09-02 LAB — PHOSPHORUS: Phosphorus: 4.9 mg/dL — ABNORMAL HIGH (ref 2.5–4.6)

## 2019-09-02 MED ORDER — PIPERACILLIN-TAZOBACTAM 3.375 G IVPB 30 MIN
3.3750 g | Freq: Four times a day (QID) | INTRAVENOUS | Status: DC
  Administered 2019-09-02 – 2019-09-03 (×3): 3.375 g via INTRAVENOUS
  Filled 2019-09-02 (×6): qty 50

## 2019-09-02 MED ORDER — NOREPINEPHRINE 4 MG/250ML-% IV SOLN
0.0000 ug/min | INTRAVENOUS | Status: DC
  Administered 2019-09-02 (×3): 40 ug/min via INTRAVENOUS
  Administered 2019-09-02: 18 ug/min via INTRAVENOUS
  Administered 2019-09-02: 40 ug/min via INTRAVENOUS
  Administered 2019-09-02: 12 ug/min via INTRAVENOUS
  Filled 2019-09-02 (×5): qty 250

## 2019-09-02 MED ORDER — INSULIN ASPART 100 UNIT/ML ~~LOC~~ SOLN
0.0000 [IU] | SUBCUTANEOUS | Status: DC
  Administered 2019-09-02: 11 [IU] via SUBCUTANEOUS
  Administered 2019-09-02: 4 [IU] via SUBCUTANEOUS
  Administered 2019-09-02 (×3): 7 [IU] via SUBCUTANEOUS
  Administered 2019-09-03 (×2): 3 [IU] via SUBCUTANEOUS

## 2019-09-02 MED ORDER — HALOPERIDOL LACTATE 5 MG/ML IJ SOLN: 2.0000 mg | Freq: Once | INTRAMUSCULAR | Status: AC

## 2019-09-02 MED ORDER — PHENYLEPHRINE CONCENTRATED 100MG/250ML (0.4 MG/ML) INFUSION SIMPLE
0.0000 ug/min | INTRAVENOUS | Status: DC
  Administered 2019-09-02 – 2019-09-03 (×4): 400 ug/min via INTRAVENOUS
  Filled 2019-09-02 (×5): qty 250

## 2019-09-02 MED ORDER — VASOPRESSIN 20 UNIT/ML IV SOLN
0.0400 [IU]/min | INTRAVENOUS | Status: DC
  Administered 2019-09-02: 0.03 [IU]/min via INTRAVENOUS
  Administered 2019-09-03: 0.04 [IU]/min via INTRAVENOUS
  Filled 2019-09-02 (×2): qty 2

## 2019-09-02 MED ORDER — SODIUM CHLORIDE 0.9 % IV SOLN
250.0000 mL | INTRAVENOUS | Status: DC
  Administered 2019-09-03: 250 mL via INTRAVENOUS

## 2019-09-02 MED ORDER — TRAVASOL 10 % IV SOLN
INTRAVENOUS | Status: DC
  Filled 2019-09-02: qty 1504.8

## 2019-09-02 MED ORDER — FENTANYL CITRATE (PF) 100 MCG/2ML IJ SOLN
INTRAMUSCULAR | Status: AC
  Filled 2019-09-02: qty 2

## 2019-09-02 MED ORDER — SODIUM CHLORIDE 0.9% IV SOLUTION: Freq: Once | INTRAVENOUS | Status: AC

## 2019-09-02 MED ORDER — "THROMBI-PAD 3""X3"" EX PADS"
1.0000 | MEDICATED_PAD | Freq: Once | CUTANEOUS | Status: AC
  Administered 2019-09-02: 1 via TOPICAL
  Filled 2019-09-02 (×2): qty 1

## 2019-09-02 MED ORDER — TRACE MINERALS CU-MN-SE-ZN 300-55-60-3000 MCG/ML IV SOLN
INTRAVENOUS | Status: DC
  Filled 2019-09-02: qty 1000.96

## 2019-09-02 MED ORDER — SODIUM BICARBONATE 8.4 % IV SOLN
INTRAVENOUS | Status: AC
  Administered 2019-09-02: 50 meq
  Filled 2019-09-02: qty 100

## 2019-09-02 MED ORDER — LIDOCAINE HCL 1 % IJ SOLN
5.0000 mL | Freq: Once | INTRAMUSCULAR | Status: DC
  Filled 2019-09-02: qty 5

## 2019-09-02 MED ORDER — PIPERACILLIN-TAZOBACTAM 3.375 G IVPB 30 MIN
3.3750 g | Freq: Four times a day (QID) | INTRAVENOUS | Status: DC
  Filled 2019-09-02 (×2): qty 50

## 2019-09-02 MED ORDER — STERILE WATER FOR INJECTION IV SOLN
INTRAVENOUS | Status: DC
  Filled 2019-09-02 (×4): qty 150

## 2019-09-02 MED ORDER — HALOPERIDOL LACTATE 5 MG/ML IJ SOLN
INTRAMUSCULAR | Status: AC
  Administered 2019-09-02: 2 mg via INTRAVENOUS
  Filled 2019-09-02: qty 1

## 2019-09-02 MED ORDER — DEXMEDETOMIDINE HCL IN NACL 400 MCG/100ML IV SOLN
0.4000 ug/kg/h | INTRAVENOUS | Status: DC
  Administered 2019-09-02: 0.4 ug/kg/h via INTRAVENOUS
  Filled 2019-09-02: qty 100

## 2019-09-02 MED ORDER — CALCIUM GLUCONATE-NACL 2-0.675 GM/100ML-% IV SOLN
2.0000 g | Freq: Once | INTRAVENOUS | Status: DC
  Filled 2019-09-02: qty 100

## 2019-09-02 MED ORDER — PRISMASOL BGK 4/2.5 32-4-2.5 MEQ/L IV SOLN
INTRAVENOUS | Status: DC
  Filled 2019-09-02 (×20): qty 5000

## 2019-09-02 MED ORDER — HEPARIN SODIUM (PORCINE) 1000 UNIT/ML DIALYSIS
1000.0000 [IU] | INTRAMUSCULAR | Status: DC | PRN
  Administered 2019-09-02: 4000 [IU] via INTRAVENOUS_CENTRAL
  Filled 2019-09-02: qty 3
  Filled 2019-09-02: qty 1

## 2019-09-02 MED ORDER — MIDAZOLAM HCL 2 MG/2ML IJ SOLN
INTRAMUSCULAR | Status: AC
  Filled 2019-09-02: qty 2

## 2019-09-02 MED ORDER — SODIUM CHLORIDE 0.9 % FOR CRRT: INTRAVENOUS_CENTRAL | Status: DC | PRN

## 2019-09-02 MED ORDER — NOREPINEPHRINE 4 MG/250ML-% IV SOLN
INTRAVENOUS | Status: AC
  Administered 2019-09-02: 4 mg
  Filled 2019-09-02: qty 250

## 2019-09-02 MED ORDER — SODIUM BICARBONATE 8.4 % IV SOLN
INTRAVENOUS | Status: AC
  Administered 2019-09-02: 50 meq
  Filled 2019-09-02: qty 50

## 2019-09-02 MED ORDER — EPINEPHRINE PF 1 MG/ML IJ SOLN: 0.5000 mg | Freq: Once | INTRAMUSCULAR | Status: DC

## 2019-09-02 MED ORDER — EPINEPHRINE 1 MG/10ML IJ SOSY: 0.5000 mg | PREFILLED_SYRINGE | Freq: Once | INTRAMUSCULAR | Status: DC

## 2019-09-02 MED ORDER — PRISMASOL BGK 4/2.5 32-4-2.5 MEQ/L REPLACEMENT SOLN
Status: DC
  Filled 2019-09-02 (×3): qty 5000

## 2019-09-02 MED ORDER — LIDOCAINE HCL (PF) 1 % IJ SOLN
INTRAMUSCULAR | Status: AC
  Filled 2019-09-02: qty 5

## 2019-09-02 MED ORDER — PHENYLEPHRINE HCL-NACL 10-0.9 MG/250ML-% IV SOLN
0.0000 ug/min | INTRAVENOUS | Status: DC
  Administered 2019-09-02 (×2): 400 ug/min via INTRAVENOUS
  Filled 2019-09-02 (×2): qty 250

## 2019-09-02 MED ORDER — HALOPERIDOL LACTATE 5 MG/ML IJ SOLN
2.0000 mg | Freq: Once | INTRAMUSCULAR | Status: AC
  Administered 2019-09-02: 2 mg via INTRAVENOUS
  Filled 2019-09-02: qty 1

## 2019-09-02 MED ORDER — BOOST / RESOURCE BREEZE PO LIQD CUSTOM: 1.0000 | Freq: Three times a day (TID) | ORAL | Status: DC

## 2019-09-02 MED ORDER — NOREPINEPHRINE 4 MG/250ML-% IV SOLN: 2.0000 ug/min | INTRAVENOUS | Status: DC

## 2019-09-02 MED ORDER — PRISMASOL BGK 4/2.5 32-4-2.5 MEQ/L REPLACEMENT SOLN
Status: DC
  Filled 2019-09-02 (×5): qty 5000

## 2019-09-02 MED ORDER — SODIUM BICARBONATE 8.4 % IV SOLN
50.0000 meq | Freq: Once | INTRAVENOUS | Status: AC
  Filled 2019-09-02: qty 100

## 2019-09-02 NOTE — Progress Notes (Addendum)
Progress Note  Patient Name: KYLIE GROS Date of Encounter: 09/02/2019  [facility] Cardiologist: Sanda Klein, MD   Subjective   The patient is sedated with precedex, being started on CRRT, he is anuric. On O2 with [place] 6L.  Inpatient Medications    Scheduled Meds: . chlorhexidine  15 mL Mouth Rinse BID  . Chlorhexidine Gluconate Cloth  6 each Topical Daily  . feeding supplement  1 Container Oral TID BM  . insulin aspart  0-20 Units Subcutaneous Q4H  . levothyroxine  75 mcg Intravenous Daily  . mouth rinse  15 mL Mouth Rinse q12n4p  . mouth rinse  15 mL Mouth Rinse BID  . sodium chloride flush  10-40 mL Intracatheter Q12H  . sodium chloride flush  3 mL Intravenous Q12H   Continuous Infusions: .  prismasol BGK 4/2.5    .  prismasol BGK 4/2.5    . sodium chloride Stopped (09/01/19 0949)  . dexmedetomidine (PRECEDEX) IV infusion 0.4 mcg/kg/hr (09/02/19 0950)  . dextrose 5% lactated ringers Stopped (08/31/19 2127)  . famotidine (PEPCID) IV Stopped (09/01/19 1019)  . heparin Stopped (09/02/19 0830)  . methocarbamol (ROBAXIN) IV    . piperacillin-tazobactam (ZOSYN)  IV 3.375 g (09/02/19 0629)  . prismasol BGK 4/2.5    . TPN ADULT (ION) 110 mL/hr at 09/02/19 0400  . TPN ADULT (ION)     PRN Meds: sodium chloride, fentaNYL (SUBLIMAZE) injection, heparin, methocarbamol (ROBAXIN) IV, ondansetron (ZOFRAN) IV, sodium chloride, sodium chloride flush   Vital Signs    Vitals:   09/02/19 0600 09/02/19 0603 09/02/19 0708 09/02/19 1105  BP: (!) 64/40 (!) 106/42    Pulse:      Resp: 15 16    Temp:   (!) 97 F (36.1 C) (!) 96.4 F (35.8 C)  TempSrc:   Axillary Axillary  SpO2:      Weight:      Height:        Intake/Output Summary (Last 24 hours) at 09/02/2019 1114 Last data filed at 09/02/2019 0500 Gross per 24 hour  Intake 2538.63 ml  Output 1408 ml  Net 1130.63 ml   Last 3 Weights 09/02/2019 09/01/2019 08/31/2019  Weight (lbs) 238 lb 15.7 oz 238 lb 15.7 oz 232 lb  2.3 oz  Weight (kg) 108.4 kg 108.4 kg 105.3 kg      Telemetry    V pacing - Personally Reviewed  ECG    No new - Personally Reviewed  Physical Exam   GEN: labored breating Neck: JVD up to jaws Cardiac: iRRR, no murmurs, rubs, or gallops.  Respiratory: coarse upper respiratory sounds. Crackles B/L GI: open abdomen, packed. MS: 2-3+ edema; anasarca.   Labs    High Sensitivity Troponin:   Recent Labs  Lab 08/15/19 1545 08/15/19 1732 08/17/19 2004 08/17/19 2148 08/30/19 1322  TROPONINIHS 13 13 19* 20* 67*      Chemistry Recent Labs  Lab 08/29/19 0549 08/29/19 1555 08/30/19 0512 08/30/19 0512 08/31/19 0403 09/01/19 0405 09/02/19 0615  NA 142   < > 141   < > 139 136 136  K 3.0*   < > 4.2   < > 4.6 4.7 4.9  CL 113*   < > 110   < > 109 106 105  CO2 20*   < > 22   < > 21* 22 21*  GLUCOSE 174*   < > 220*   < > 181* 216* 236*  BUN 29*   < > 48*   < >  62* 83* 115*  CREATININE 2.48*   < > 3.43*   < > 3.76* 3.93* 4.59*  CALCIUM 6.3*   < > 7.4*   < > 7.7* 7.9* 8.0*  PROT 4.0*  --  4.6*  --  4.5*  --   --   ALBUMIN 1.9*  --  1.8*  --  1.4*  --   --   AST 245*  --  220*  --  156*  --   --   ALT 91*  --  115*  --  109*  --   --   ALKPHOS 60  --  73  --  111  --   --   BILITOT 1.2  --  0.9  --  0.6  --   --   GFRNONAA 25*   < > 17*   < > 15* 14* 12*  GFRAA 29*   < > 19*   < > 17* 16* 14*  ANIONGAP 9   < > 9   < > 9 8 10    < > = values in this interval not displayed.     Hematology Recent Labs  Lab 08/31/19 0403 09/01/19 0405 09/02/19 0540  WBC 15.9* 15.1* 18.5*  RBC 2.59* 2.46* 1.91*  HGB 9.1* 8.6* 6.6*  HCT 28.5* 27.4* 21.4*  MCV 110.0* 111.4* 112.0*  MCH 35.1* 35.0* 34.6*  MCHC 31.9 31.4 30.8  RDW 16.9* 17.2* 17.1*  PLT 360 331 276    BNPNo results for input(s): BNP, PROBNP in the last 168 hours.   DDimer No results for input(s): DDIMER in the last 168 hours.   Radiology    No results found.  Cardiac Studies   Echo demonstrating apical  akinesis, EF ~ 50% and large LV apical thrombus  Patient Profile     CHISTOPHER MANGINO is a 74 y.o. male with a history of CAD s/p remote CABG x5 in 1997, ischemic cardiomyopathy with improved EF of 50-55% on last Echo in 2016 s/p Medtronic CRT-D, paroxysmal atrial fibrillation on Amiodarone and Pradaxa, hypertension, hyperlipidemia, type 2 diabetes mellitus, and prior DVT who is being seen for the evaluation of LV thrombus.  Assessment & Plan    1. 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 Apical akinesis is likely chronic, possibly since 2014. - on IV heparin. May need transition to warfarin once able to take meds PO.   2. PAF - currently V paced with underlying atrial fibrillation and intermitted AV pacing, runs of nsVT Pradaxa held for surgery and reversed with praxbind. On IV heparin for #1  3. Ischemic CM with CRT-D  4. 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 - resume amiodarone when tolerated hemodynamically.   5. 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 AKI and decreased urine output - nephrology consult suggest ischemic ATN - he is anuric with anasarca - failed lasix challenge, nephrology follows and starting CRRT   CRITICAL CARE Performed by: Ena Dawley, MD Total critical care time: 35 minutes   Critical care was necessary to treat or prevent imminent or life-threatening deterioration.   Critical care was time spent personally by me  on the following activities: development of treatment plan with patient and/or surrogate as well as nursing,  discussions with consultants, evaluation of patient's response to treatment, examination of patient, obtaining history from patient or surrogate, ordering and performing  treatments and interventions, ordering and review of laboratory studies, ordering and review of radiographic studies, pulse oximetry and re-evaluation of patient's condition.  For questions or updates, please contact [place] Please consult [email or web address] for contact info under     Signed, Ena Dawley, MD

## 2019-09-02 NOTE — Progress Notes (Signed)
ANTICOAGULATION CONSULT NOTE - Follow Up Consult  Pharmacy Consult: Heparin Indication:  History of Afib/DVT and acute LV thrombus  No Known Allergies  Patient Measurements: Height: 6\' 2"  (188 cm) Weight: 108.4 kg (238 lb 15.7 oz) IBW/kg (Calculated) : 82.2 Heparin Dosing Weight: 97 kg  Vital Signs: Temp: 97 F (36.1 C) (06/19 0708) Temp Source: Axillary (06/19 0708) BP: 106/42 (06/19 0603) Pulse Rate: 76 (06/19 0230)  Labs: Recent Labs    08/30/19 1322 08/31/19 0403 08/31/19 0403 08/31/19 0732 08/31/19 1700 09/01/19 0405 09/02/19 0540 09/02/19 0615  HGB  --  9.1*   < >  --   --  8.6* 6.6*  --   HCT  --  28.5*  --   --   --  27.4* 21.4*  --   PLT  --  360  --   --   --  331 276  --   HEPARINUNFRC  --   --   --    < > 0.50 0.68 0.42  --   CREATININE  --  3.76*  --   --   --  3.93*  --  4.59*  TROPONINIHS 67*  --   --   --   --   --   --   --    < > = values in this interval not displayed.    Estimated Creatinine Clearance: 18.5 mL/min (A) (by C-G formula based on SCr of 4.59 mg/dL (H)).   Medical History: Past Medical History:  Diagnosis Date  . CAD (coronary artery disease)   . DM (diabetes mellitus) ([place])    type 2   . DVT (deep venous thrombosis) ([place])   . Hyperlipidemia   . Hypertension   . ICD (implantable cardioverter-defibrillator), single, in situ   . Ischemic cardiomyopathy   . PAF (paroxysmal atrial fibrillation) ([place])   . RBBB   . 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  He was started on prophylactic Lovenox post-op (last dose was ~1000 on 6/16).  Pharmacy was consulted to start IV heparin (no bolus) for history of afib/DVT and new LV apical thrombus on ECHO.    Heparin level remains at goal at 0.42,. Hg drifting down to 6.6, plt wnl. Noted AKI with plans to start CRRT this AM. No bleeding or issues with  infusion per discussion with RN.   Goal of Therapy:  Heparin level 0.3-0.7 units/ml Monitor platelets by anticoagulation protocol: Yes   Plan:  Continue heparin at 1250 units/hr Monitor daily heparin level and CBC Monitor for signs/symptoms of bleeding   Nicoletta Dress, PharmD PGY2 Infectious Disease Pharmacy Resident  09/02/2019 7:10 AM

## 2019-09-02 NOTE — Procedures (Signed)
Arterial Catheter Insertion Procedure Note Alan Orr [id number] [birth year]  Procedure: Insertion of Arterial Catheter  Indications: Blood pressure monitoring  Procedure Details Consent: Unable to obtain consent because of emergent medical necessity. Time Out: Verified patient identification, verified procedure, site/side was marked, verified correct patient position, special equipment/implants available, medications/allergies/relevent history reviewed, required imaging and test results available.  Performed  Maximum sterile technique was used including antiseptics, cap, gloves, gown, hand hygiene, mask and sheet. Skin prep: Chlorhexidine; local anesthetic administered 20 gauge catheter was inserted into right femoral artery using the Seldinger technique. ULTRASOUND GUIDANCE USED: NO Evaluation Blood flow good; BP tracing good. Complications: No apparent complications.   Alan Orr Alan Orr 09/02/2019

## 2019-09-02 NOTE — Progress Notes (Signed)
NAME:  Alan Orr, MRN:  [id number], DOB:  07/25/1945, LOS: 5 ADMISSION DATE:  09/02/2019, CONSULTATION DATE: 6/15 REFERRING MD: Dr. Broadus John, CHIEF COMPLAINT: Shock  Brief History   74 year old male admitted with acute abdomen 6/14 taken emergently to the OR for right hemicolectomy with end ileostomy.  Postoperatively he was hypotensive despite IV fluid resuscitation and was transferred to ICU for further management.  History of present illness   74 year old male with past medical history as below, which is significant for coronary artery disease status post CABG, atrial fibrillation on Pradaxa, VT status post AICD, CKD 3. He lives alone and is fairly independent per family (still changes his own motor oil). Recently admitted to [facility] after fall for R humerus fracture. There was concern for polypharmacy and for inability to self care so he was discharged to SNF. He presented to [facility] emergency department on 6/14 with complaints of abdominal distention and tenderness x24 hours.  CT imaging of the abdomen demonstrated bowel perforation general surgery at any [facility] recommended transfer to [facility].  Upon arrival to [facility] he was taken emergently to the operating room by Dr. Kieth Brightly where he underwent right hemicolectomy with end ileostomy.  Fascia was closed in the OR soft tissue remained open and surgically packed and dressed.  Postoperative he developed progressive hypotension despite almost 5 L of IV fluids since the time of admission.  PCCM was consulted for possible pressor need.  Past Medical History   has a past medical history of CAD (coronary artery disease), DM (diabetes mellitus) ([place]), DVT (deep venous thrombosis) ([place]), Hyperlipidemia, Hypertension, ICD (implantable cardioverter-defibrillator), single, in situ, Ischemic cardiomyopathy, PAF (paroxysmal atrial fibrillation) ([place]), RBBB, and S/P CABG x 5.   Significant Hospital Events   6/14: Admit, 2 OR for  hemicolectomy  Consults:  General surgery  Procedures:  6/14: Right IJ double-lumen central line  Significant Diagnostic Tests:  CT abdomen, pelvis 6/14> Extensive free air in the abdomen consistent with bowel perforation.  Tiny bilateral pleural effusions.  ECHO 6/16 > 1. There is a 2.8 x 1.4 cm mobile protruding thrombus in the LV apex.  Left ventricular ejection fraction, by estimation, is 60 to 65%. The left  ventricle has normal function. The left ventricle has no regional wall  motion abnormalities. Left ventricular  diastolic parameters are consistent with Grade I diastolic dysfunction  (impaired relaxation). There is akinesis of the left ventricular, entire  apical segment.  2. Right ventricular systolic function is normal. The right ventricular  size is normal. Tricuspid regurgitation signal is inadequate for assessing  PA pressure.  3. The mitral valve is normal in structure. No evidence of mitral valve  regurgitation. No evidence of mitral stenosis.  4. The aortic valve was not well visualized. Aortic valve regurgitation  is not visualized. No aortic stenosis is present.  5. The inferior vena cava is normal in size with <50% respiratory  variability, suggesting right atrial pressure of 8 mmHg.  6. Report of LV apical thrombus called to Dr. Sigmund Hazel Data:  Blood 6/14 > NGTD  Antimicrobials:  Zosyn 6/14>   Interim history/subjective:   Negligible response to diuretic.  Patient agitated today refusing care.  Disoriented.  Spoke to nephew who is amenable to initiation of dialysis for fluid overload.   Objective   Blood pressure (!) 117/99, pulse 84, temperature (!) 96.4 F (35.8 C), temperature source Axillary, resp. rate (!) 23, height 6\' 2"  (1.88 m), weight 108.4 kg,  SpO2 95 %. CVP:  [13 mmHg] 13 mmHg      Intake/Output Summary (Last 24 hours) at 09/02/2019 1621 Last data filed at 09/02/2019 1400 Gross per 24 hour  Intake 3287.48 ml  Output  1393 ml  Net 1894.48 ml   Filed Weights   08/31/19 0500 09/01/19 0800 09/02/19 0500  Weight: 105.3 kg 108.4 kg 108.4 kg    Examination: General: Chronically ill appearing deconditioned elderly male lying in bed, in NAD HEENT: [place]/AT, MM pink/moist, PERRL, NG tube in place with minimal output-removed by surgery Neuro: Only partially oriented.  Mumbling speech. CV: s1s2 regular rate and rhythm, no murmur, rubs, or gallops,  PULM:  Clear to ascultation bilaterally, no added breath sounds, no increased work of breathing  GI: soft, bowel sounds hypoactive in all 4 quadrants, tender to palpation, ostomy site clean no stool output, ABD dressing /D/I Extremities: warm/dry, 3+ pitting edema in upper and lower extremities, right arm in sling  Skin: no rashes or lesions  Resolved Hospital Problem list   Hypokalemia   Assessment & Plan:   Was critically ill due to septic shock secondary to peritonitis in the setting of bowel perforation Delayed return of gastrointestinal function requiring TPN S/p right hemicolectomy with end ileostomy  Acute kidney injury superimposed on CKD 3 Mobile LV thrombus  Atrial fibrillation Ventricular tachycardia status post ICD CAD s/p CABG x  DM2 Right humerus fractureStatus post fall 10 days prior to arrival Elevated LFT's, improving Hypothyroidism  Plan:  Currently no longer in shock.  Massive volume overload.  Acute kidney injury not responsive to diuresis.  Will initiate CRRT for fluid removal.  Goal would be to restore baseline renal function.  Have advised nephew that this may not occur that patient will be a poor candidate for long-term dialysis. Increasing agitation consistent with delirium.  Start on Precedex infusion to ensure tolerance with CRRT. Hypotension requiring titration of norepinephrine to allow tolerance of Precedex. Continue TPN pending return of bowel function.  We will add insulin to TPN for hyperglycemia. Challenge GI tract with  sips of clears Continue IV heparin for left ventricular thrombus.  Best practice:  Diet: N.p.o. Receiving TPN DVT prophylaxis: SCDs, lovenox GI prophylaxis: Pepcid Glucose control: CBG monitoring and SSI Mobility: Bedrest Code Status: Full code Family Communication: Nephew updated this am.  Disposition: ICU  Labs   CBC: Recent Labs  Lab 08/29/2019 1642 08/29/19 0044 08/29/19 0549 08/29/19 0549 08/29/19 1555 08/30/19 0512 08/31/19 0403 09/01/19 0405 09/02/19 0540  WBC 11.8*  --  8.1   < > 12.0* 14.6* 15.9* 15.1* 18.5*  NEUTROABS 10.7*  --  7.2  --   --  13.6*  --   --   --   HGB 12.8*   < > 8.5*   < > 9.6* 9.4* 9.1* 8.6* 6.6*  HCT 38.3*   < > 25.9*   < > 29.3* 28.9* 28.5* 27.4* 21.4*  MCV 107.0*  --  106.6*   < > 108.1* 108.2* 110.0* 111.4* 112.0*  PLT 352  --  296   < > 367 403* 360 331 276   < > = values in this interval not displayed.    Basic Metabolic Panel: Recent Labs  Lab 08/29/19 0549 08/29/19 0549 08/29/19 1555 08/30/19 0512 08/31/19 0403 09/01/19 0405 09/02/19 0615  NA 142   < > 140 141 139 136 136  K 3.0*   < > 4.2 4.2 4.6 4.7 4.9  CL 113*   < > 111  110 109 106 105  CO2 20*   < > 21* 22 21* 22 21*  GLUCOSE 174*   < > 162* 220* 181* 216* 236*  BUN 29*   < > 39* 48* 62* 83* 115*  CREATININE 2.48*   < > 2.94* 3.43* 3.76* 3.93* 4.59*  CALCIUM 6.3*   < > 7.1* 7.4* 7.7* 7.9* 8.0*  MG 1.7  --   --  2.5* 2.8* 2.5* 2.3  PHOS  --   --   --  3.5 4.1 4.2 4.9*   < > = values in this interval not displayed.   GFR: Estimated Creatinine Clearance: 18.5 mL/min (A) (by C-G formula based on SCr of 4.59 mg/dL (H)). Recent Labs  Lab 08/29/19 0549 08/29/19 0549 08/29/19 1555 08/29/19 1555 08/30/19 0512 08/31/19 0403 09/01/19 0405 09/02/19 0540  WBC 8.1   < > 12.0*   < > 14.6* 15.9* 15.1* 18.5*  LATICACIDVEN 2.1*  --  2.0*  --   --   --   --   --    < > = values in this interval not displayed.    Liver Function Tests: Recent Labs  Lab 08/18/2019 1740  08/29/19 0549 08/30/19 0512 08/31/19 0403  AST 116* 245* 220* 156*  ALT 60* 91* 115* 109*  ALKPHOS 96 60 73 111  BILITOT 1.1 1.2 0.9 0.6  PROT 5.7* 4.0* 4.6* 4.5*  ALBUMIN 2.0* 1.9* 1.8* 1.4*   Recent Labs  Lab 09/02/2019 1740  LIPASE 23   No results for input(s): AMMONIA in the last 168 hours.  ABG    Component Value Date/Time   PHART 7.368 08/29/2019 0044   PCO2ART 41.9 08/29/2019 0044   PO2ART 502 (H) 08/29/2019 0044   HCO3 25.1 08/29/2019 0044   TCO2 27 08/29/2019 0044   ACIDBASEDEF 1.0 08/29/2019 0044   O2SAT 100.0 08/29/2019 0044     Coagulation Profile: Recent Labs  Lab 09/13/2019 1740  INR 1.8*    Cardiac Enzymes: No results for input(s): CKTOTAL, CKMB, CKMBINDEX, TROPONINI in the last 168 hours.  HbA1C: Hemoglobin-A1c  Date/Time Value Ref Range Status  12/09/2006 01:50 PM (H) <6.0 % Final   8.6 (NOTE)  Therapeutic target for the treatment of diabetes mellitus patients is <7% HbA1c.  American Diabetes Association Diabetes Care 2002;25:S33-S49.   Hgb A1c MFr Bld  Date/Time Value Ref Range Status  08/18/2019 11:54 AM 6.0 (H) 4.8 - 5.6 % Final    Comment:    (NOTE) Pre diabetes:          5.7%-6.4% Diabetes:              >6.4% Glycemic control for   <7.0% adults with diabetes   06/21/2018 01:38 PM 5.9 (H) 4.8 - 5.6 % Final    Comment:    (NOTE) Pre diabetes:          5.7%-6.4% Diabetes:              >6.4% Glycemic control for   <7.0% adults with diabetes     CBG: Recent Labs  Lab 09/01/19 2312 09/02/19 0304 09/02/19 0706 09/02/19 1104 09/02/19 1503  GLUCAP 204* 232* 224* 227* 259*    CRITICAL CARE Performed by: Kipp Brood   Total critical care time: 50 minutes  Critical care time was exclusive of separately billable procedures and treating other patients.  Critical care was necessary to treat or prevent imminent or life-threatening deterioration.  Critical care was time spent personally by me on the following activities:  development of treatment plan with patient and/or surrogate as well as nursing, discussions with consultants, evaluation of patient's response to treatment, examination of patient, obtaining history from patient or surrogate, ordering and performing treatments and interventions, ordering and review of laboratory studies, ordering and review of radiographic studies, pulse oximetry, re-evaluation of patient's condition and participation in multidisciplinary rounds.  Kipp Brood, MD [facility] ICU Physician [place]  Pager: [phone] Mobile: [phone] After hours: [phone].    09/02/2019, 4:21 PM

## 2019-09-02 NOTE — Progress Notes (Signed)
Patient ID: Alan Orr, male   DOB: February 18, 1946, 74 y.o.   MRN: [id number]    5 Days Post-Op  Subjective: Patient states he does not feel well today.  Denies nausea or abdominal pain.  Wants to be left alone and wants nothing else done to him.  ROS: See above, otherwise other systems negative  Objective: Vital signs in last 24 hours: Temp:  [97 F (36.1 C)-98.7 F (37.1 C)] 97 F (36.1 C) (06/19 0708) Pulse Rate:  [71-91] 76 (06/19 0230) Resp:  [15-29] 16 (06/19 0603) BP: (64-141)/(37-100) 106/42 (06/19 0603) SpO2:  [99 %-100 %] 100 % (06/19 0230) Weight:  [108.4 kg] 108.4 kg (06/19 0500) Last BM Date: 09/02/19  Intake/Output from previous day: 06/18 0701 - 06/19 0700 In: 2944.3 [P.O.:60; I.V.:2659.8; IV Piggyback:224.5] Out: 1480 [Urine:430; Stool:1050] Intake/Output this shift: No intake/output data recorded.  PE: Abd: NGT with no residual (after much shaking of his head, RNs able to remove this).  He would not let me assess him abdomen.  Midline wound is covered.  Ileostomy bag was seen and with serosang output, old darker blood, not fresh   Lab Results:  Recent Labs    09/01/19 0405 09/02/19 0540  WBC 15.1* 18.5*  HGB 8.6* 6.6*  HCT 27.4* 21.4*  PLT 331 276   BMET Recent Labs    09/01/19 0405 09/02/19 0615  NA 136 136  K 4.7 4.9  CL 106 105  CO2 22 21*  GLUCOSE 216* 236*  BUN 83* 115*  CREATININE 3.93* 4.59*  CALCIUM 7.9* 8.0*   PT/INR No results for input(s): LABPROT, INR in the last 72 hours. CMP     Component Value Date/Time   NA 136 09/02/2019 0615   NA 144 06/26/2019 0930   K 4.9 09/02/2019 0615   CL 105 09/02/2019 0615   CO2 21 (L) 09/02/2019 0615   GLUCOSE 236 (H) 09/02/2019 0615   BUN 115 (H) 09/02/2019 0615   BUN 15 06/26/2019 0930   CREATININE 4.59 (H) 09/02/2019 0615   CREATININE 1.42 (H) 09/20/2015 1004   CALCIUM 8.0 (L) 09/02/2019 0615   PROT 4.5 (L) 08/31/2019 0403   PROT 6.6 06/26/2019 0930   ALBUMIN 1.4 (L) 08/31/2019  0403   ALBUMIN 3.6 (L) 06/26/2019 0930   AST 156 (H) 08/31/2019 0403   ALT 109 (H) 08/31/2019 0403   ALKPHOS 111 08/31/2019 0403   BILITOT 0.6 08/31/2019 0403   BILITOT 0.6 06/26/2019 0930   GFRNONAA 12 (L) 09/02/2019 0615   GFRAA 14 (L) 09/02/2019 0615   Lipase     Component Value Date/Time   LIPASE 23 08/30/2019 1740       Studies/Results: No results found.  Anti-infectives: Anti-infectives (From admission, onward)   Start     Dose/Rate Route Frequency Ordered Stop   08/29/19 1200  piperacillin-tazobactam (ZOSYN) IVPB 3.375 g     Discontinue     3.375 g 12.5 mL/hr over 240 Minutes Intravenous Every 8 hours 08/29/19 0405     08/29/19 0415  piperacillin-tazobactam (ZOSYN) IVPB 3.375 g        3.375 g 12.5 mL/hr over 240 Minutes Intravenous STAT 08/29/19 0405 08/29/19 0954   09/01/2019 1915  piperacillin-tazobactam (ZOSYN) IVPB 3.375 g        3.375 g 100 mL/hr over 30 Minutes Intravenous  Once 08/18/2019 1905 08/17/2019 2136       Assessment/Plan CAD s/p CABGx5 PAF - normally on pradaxa Biventricular ICD Ischemic cardiomyopathy HTN HLD AKI on Stage III  CKD - worsening, per renal and CCM, may need CRRT Hx of DVT T2DM- SSI Hypothyroidism Hypokalemia/hypocalcemia - resolved Septic shock-pressors off, improving PCM - prealbumin <5. TNA initiated LV thrombus - ok for heparin gtt, no bolus Anemia - hgb 6.6 today.  Heparin gtt on hold.  Transfuse per CCM   POD4,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 Albertina Senegal perforated R colon with feculent peritonitis - DC NGT, CLD. - continue IV abx, high risk for post-op abscess. [id number], trending up. Will follow. Plan for 5 days of post op abx -cont TNA - PT/OT consults - BID dressing changes - WOC consult for new ileostomy -patient is very adamant today that he wants nothing more done to him and doesn't feel well.  I have discussed him with Dr. Lynetta Mare.  It is unclear if he is really frustrated today  or a little confused.  He seems to be more frustrated than confused.    FEN: CLD/resource breeze/TNA VTE: SCDs,heparin gtt on hold due to hgb of 6.6 ID: IV Zosyn 6/14>>   LOS: 5 days    Henreitta Cea , [facility] Surgery 09/02/2019, 9:37 AM Please see Amion for pager number during day hours 7:00am-4:30pm or 7:00am -11:30am on weekends

## 2019-09-02 NOTE — Procedures (Signed)
Central Venous Catheter Insertion Procedure Note Alan Orr [id number] 23-Aug-1945  Procedure: Insertion of Central Venous Catheter Indications: Hemodialysis  Procedure Details Consent: Risks of procedure as well as the alternatives and risks of each were explained to the (patient/caregiver).  Consent for procedure obtained. Time Out: Verified patient identification, verified procedure, site/side was marked, verified correct patient position, special equipment/implants available, medications/allergies/relevent history reviewed, required imaging and test results available.  Performed  Maximum sterile technique was used including antiseptics, cap, gloves, gown, hand hygiene, mask and sheet. Skin prep: Chlorhexidine; local anesthetic administered A antimicrobial bonded/coated double lumen catheter was placed in the left femoral vein due to patient being a dialysis patient using the Seldinger technique.  Ultrasound guidance.      Evaluation Blood flow good Complications: No apparent complications Patient did tolerate procedure well.   Alan Orr 09/02/2019, 4:25 PM

## 2019-09-02 NOTE — Progress Notes (Signed)
[place] Progress Note Patient Name: PERSHING SKIDMORE DOB: Jul 10, 1945 MRN: [id number]   Date of Service  09/02/2019  HPI/Events of Note  Notified of hypotension. Patient intubated and initiated on CRRT this afternoon. On vaso 0.03 and max on levo. Hgb 6.6.  eICU Interventions   Increase vaso to 0.04. Ordered to transfuse 1 unit PRBC.  I called to get consent from nephew Driscilla Moats, patient's POA. He is aware that patient is on dialysis. Updated him that patient is on multiple pressors and has been intubated. He gave consent for blood transfusion.     Intervention Category Major Interventions: Hypotension - evaluation and management;Respiratory failure - evaluation and management  Judd Lien 09/02/2019, 7:30 PM

## 2019-09-02 NOTE — Progress Notes (Signed)
Patient decreased respirations Dr Coy Saunas in to see patient. Patient intubated Right hemodiaysis catheter placed and right femoral arterial line placed. Patient given bicarb calcium and epinephrine see MAR.

## 2019-09-02 NOTE — Progress Notes (Addendum)
[place] Progress Note Patient Name: Alan Orr DOB: 07-31-1945 MRN: [id number]   Date of Service  09/02/2019  HPI/Events of Note  Patient continues to be hypotensive with the addition of norepinephrine on top of vasopressin and norepinephrine. STAT ABG requested 7.16/38.6/130. On chart review, patient has LV thrombus off heparin drip due to bleed. Repeat H/H post 1 unit 6.1/18.  eICU Interventions   Ordered bicarb 50 meqs and renal informed of result, change in bicarb bath ordered  Ordered to transfuse 1 more unit PRBC  Spoke with nephew who is now at bedside. Explained to him that patient is quite sick and we are doing everything but he may go into cardiac arrest tonight due to severe metabolic acidosis either from bowel ischemia or from obstructive/septic shock and we are unable to anticoagulate due to risk of bleed. 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 Comfort care was suggested but would want to see how he does overnight with current interventions.     Intervention Category Major Interventions: Hypotension - evaluation and management  Judd Lien 09/02/2019, 10:28 PM

## 2019-09-02 NOTE — Progress Notes (Signed)
PHARMACY NOTE:  ANTIMICROBIAL RENAL DOSAGE ADJUSTMENT  Current antimicrobial regimen includes a mismatch between antimicrobial dosage and estimated renal function.  As per policy approved by the Pharmacy & Therapeutics and Medical Executive Committees, the antimicrobial dosage will be adjusted accordingly.  Current antimicrobial dosage:  Zosyn 3.375g IV EI q8h  Indication: IAI  Renal Function:  Estimated Creatinine Clearance: 18.5 mL/min (A) (by C-G formula based on SCr of 4.59 mg/dL (H)). []      On intermittent HD, scheduled: [x]      On CRRT    Antimicrobial dosage has been changed to:  Zosyn 3.375g IV q6h  Additional comments:   Thank you for allowing pharmacy to be a part of this patient's care.  Arrie Senate, PharmD, BCPS Clinical Pharmacist [phone] Please check AMION for all [place] numbers 09/02/2019

## 2019-09-02 NOTE — Progress Notes (Signed)
Alan Orr ROUNDING NOTE   Subjective:   This is a 74 year old gentleman with history of diabetes hypertension coronary artery status post CABG chronic atrial fibrillation ICD in place.  He has a recent history of a fall  with a right humeral fracture and was placed in SNF.  He was brought to the emergency room 08/28/2018 with abdominal pain found to have bowel perforation peritonitis undergoing right hemicolectomy and end ileostomy.  He had some transient hypotension requiring pressors and was placed in the intensive care unit.  He was taking an ACE inhibitor ramipril 2.5 mg prior to admission.  His baseline serum creatinine appears to be about 1.3 mg/dL.  CT scan abdomen and pelvis performed 08/15/2019 did not reveal any evidence of hydronephrosis.  He had a urinalysis performed 08/18/2019 that showed 11-20 RBCs and no evidence of proteinuria.  He was found to have a left atrial thrombus on 2D echo 08/30/2019 which is 2.8 x 1.4 cm mobile protruding thrombus in the left ventricular apex with normal ejection fraction of 44-96% with diastolic dysfunction.  Blood pressure 106/42 pulse 76 temperature 98.3  Urine output 505 cc.  Weight 108.4 kg positive fluid balance 10 kilogram weight gain since admission  Labs pending from a.m. 09/02/2019 at 6:41 AM  Insulin sliding scale, levothyroxine 75 mcg IV daily, IV heparin  Metolazone 5 mg administered 09/01/2019 Furosemide drip 10 cc an hour.   IV Zosyn 3.375 g every 8 hours  Objective:  Vital signs in last 24 hours:  Temp:  [97.8 F (36.6 C)-98.8 F (37.1 C)] 98.3 F (36.8 C) (06/19 0307) Pulse Rate:  [52-91] 76 (06/19 0230) Resp:  [16-29] 16 (06/19 0603) BP: (98-141)/(37-100) 106/42 (06/19 0603) SpO2:  [96 %-100 %] 100 % (06/19 0230) Weight:  [108.4 kg] 108.4 kg (06/19 0500)  Weight change:  Filed Weights   08/31/19 0500 09/01/19 0800 09/02/19 0500  Weight: 105.3 kg 108.4 kg 108.4 kg    Intake/Output: I/O last 3 completed  shifts: In: 5143.7 [P.O.:60; I.V.:4710.3; IV Piggyback:373.4] Out: 1051 [Urine:841; Emesis/NG output:110; Stool:100]   Intake/Output this shift:  Total I/O In: 1245.7 [I.V.:1191.5; IV Piggyback:54.2] Out: 950 [Stool:950]     General: White male, actually appears older than his stated age.  He is alert, minimal distress but is having abdominal pain HEENT: Pupils are equal round react to light, extra motions are intact, mucous membranes are moist Neck: JVD is present Heart: Regular rate and rhythm Lungs: Decreased breath sounds at extreme bases Abdomen: Postoperative, minimal bowel sounds, slightly distended and tender Extremities: 2+ pitting edema throughout Skin: Warm and dry Neuro: Alert, nonfocal   Basic Metabolic Panel: Recent Labs  Lab 08/29/19 0549 08/29/19 0549 08/29/19 1555 08/29/19 1555 08/30/19 0512 08/31/19 0403 09/01/19 0405  NA 142  --  140  --  141 139 136  K 3.0*  --  4.2  --  4.2 4.6 4.7  CL 113*  --  111  --  110 109 106  CO2 20*  --  21*  --  22 21* 22  GLUCOSE 174*  --  162*  --  220* 181* 216*  BUN 29*  --  39*  --  48* 62* 83*  CREATININE 2.48*  --  2.94*  --  3.43* 3.76* 3.93*  CALCIUM 6.3*   < > 7.1*   < > 7.4* 7.7* 7.9*  MG 1.7  --   --   --  2.5* 2.8* 2.5*  PHOS  --   --   --   --  3.5 4.1 4.2   < > = values in this interval not displayed.    Liver Function Tests: Recent Labs  Lab 09/01/2019 1740 08/29/19 0549 08/30/19 0512 08/31/19 0403  AST 116* 245* 220* 156*  ALT 60* 91* 115* 109*  ALKPHOS 96 60 73 111  BILITOT 1.1 1.2 0.9 0.6  PROT 5.7* 4.0* 4.6* 4.5*  ALBUMIN 2.0* 1.9* 1.8* 1.4*   Recent Labs  Lab 08/16/2019 1740  LIPASE 23   No results for input(s): AMMONIA in the last 168 hours.  CBC: Recent Labs  Lab 08/27/2019 1642 08/29/19 0044 08/29/19 0549 08/29/19 1555 08/30/19 0512 08/31/19 0403 09/01/19 0405  WBC 11.8*  --  8.1 12.0* 14.6* 15.9* 15.1*  NEUTROABS 10.7*  --  7.2  --  13.6*  --   --   HGB 12.8*   < > 8.5* 9.6*  9.4* 9.1* 8.6*  HCT 38.3*   < > 25.9* 29.3* 28.9* 28.5* 27.4*  MCV 107.0*  --  106.6* 108.1* 108.2* 110.0* 111.4*  PLT 352  --  296 367 403* 360 331   < > = values in this interval not displayed.    Cardiac Enzymes: No results for input(s): CKTOTAL, CKMB, CKMBINDEX, TROPONINI in the last 168 hours.  BNP: Invalid input(s): POCBNP  CBG: Recent Labs  Lab 09/01/19 1101 09/01/19 1518 09/01/19 2008 09/01/19 2312 09/02/19 0304  GLUCAP 180* 212* 218* 204* 66*    Microbiology: Results for orders placed or performed during the hospital encounter of 09/01/2019  SARS Coronavirus 2 by RT PCR (hospital order, performed in [facility] hospital lab) Nasopharyngeal Nasopharyngeal Swab     Status: None   Collection Time: 08/31/2019  7:00 PM   Specimen: Nasopharyngeal Swab  Result Value Ref Range Status   SARS Coronavirus 2 NEGATIVE NEGATIVE Final    Comment: (NOTE) SARS-CoV-2 target nucleic acids are NOT DETECTED.  The SARS-CoV-2 RNA is generally detectable in upper and lower respiratory specimens during the acute phase of infection. The lowest concentration of SARS-CoV-2 viral copies this assay can detect is 250 copies / mL. A negative result does not preclude SARS-CoV-2 infection and should not be used as the sole basis for treatment or other patient management decisions.  A negative result may occur with improper specimen collection / handling, submission of specimen other than nasopharyngeal swab, presence of viral mutation(s) within the areas targeted by this assay, and inadequate number of viral copies (<250 copies / mL). A negative result must be combined with clinical observations, patient history, and epidemiological information.  Fact Sheet for Patients:   StrictlyIdeas.no  Fact Sheet for Healthcare Providers: BankingDealers.co.za  This test is not yet approved or  cleared by the [place] FDA and has been authorized for  detection and/or diagnosis of SARS-CoV-2 by FDA under an Emergency Use Authorization (EUA).  This EUA will remain in effect (meaning this test can be used) for the duration of the COVID-19 declaration under Section 564(b)(1) of the Act, 21 U.S.C. section 360bbb-3(b)(1), unless the authorization is terminated or revoked sooner.  Performed at [facility], [address] [place], [place] [address]   Culture, blood (Routine X 2) w Reflex to ID Panel     Status: None (Preliminary result)   Collection Time: 08/29/19  5:41 AM   Specimen: BLOOD LEFT HAND  Result Value Ref Range Status   Specimen Description BLOOD LEFT HAND  Final   Special Requests   Final    BOTTLES DRAWN AEROBIC ONLY Blood Culture adequate  volume   Culture   Final    NO GROWTH 3 DAYS Performed at [facility], [place] [address], [place], [place] [address]    Report Status PENDING  Incomplete  Culture, blood (Routine X 2) w Reflex to ID Panel     Status: None (Preliminary result)   Collection Time: 08/29/19  5:49 AM   Specimen: BLOOD  Result Value Ref Range Status   Specimen Description BLOOD SITE NOT SPECIFIED  Final   Special Requests   Final    BOTTLES DRAWN AEROBIC ONLY Blood Culture results may not be optimal due to an inadequate volume of blood received in culture bottles   Culture   Final    NO GROWTH 3 DAYS Performed at [facility], [place] [address], [place], [place] [address]    Report Status PENDING  Incomplete    Coagulation Studies: No results for input(s): LABPROT, INR in the last 72 hours.  Urinalysis: No results for input(s): COLORURINE, LABSPEC, PHURINE, GLUCOSEU, HGBUR, BILIRUBINUR, KETONESUR, PROTEINUR, UROBILINOGEN, NITRITE, LEUKOCYTESUR in the last 72 hours.  Invalid input(s): APPERANCEUR    Imaging: No results found.   Medications:   . sodium chloride Stopped (09/01/19 0949)  . dextrose 5% lactated ringers Stopped (08/31/19 2127)  . famotidine (PEPCID) IV Stopped (09/01/19  1019)  . furosemide (LASIX) infusion 10 mg/hr (09/02/19 0400)  . heparin 1,250 Units/hr (09/02/19 0400)  . methocarbamol (ROBAXIN) IV    . piperacillin-tazobactam (ZOSYN)  IV 3.375 g (09/02/19 0629)  . TPN ADULT (ION) 110 mL/hr at 09/02/19 0400   . chlorhexidine  15 mL Mouth Rinse BID  . Chlorhexidine Gluconate Cloth  6 each Topical Daily  . insulin aspart  2-6 Units Subcutaneous Q4H  . levothyroxine  75 mcg Intravenous Daily  . mouth rinse  15 mL Mouth Rinse q12n4p  . mouth rinse  15 mL Mouth Rinse BID  . sodium chloride flush  10-40 mL Intracatheter Q12H  . sodium chloride flush  3 mL Intravenous Q12H   sodium chloride, fentaNYL (SUBLIMAZE) injection, methocarbamol (ROBAXIN) IV, ondansetron (ZOFRAN) IV, sodium chloride flush  Assessment/ Plan:   Acute kidney injury.  Baseline serum creatinine 1.3 status post hemicolectomy and end ileostomy 09/05/2019.  Postop complication by hemodynamic instability.  CT scan did not reveal any evidence of hydronephrosis.  His most recent urinalysis did not reveal any proteinuria but did have some microscopic hematuria.  Not thought to be a great candidate for long-term dialysis.  Has some marginal urine output.  Patient was on ACE inhibitor ramipril as an outpatient.  Prior to admission.  Etiology of his acute kidney injury appears to be ischemic ATN.  Continue to avoid nephrotoxins ACE inhibitor's ARB, IV contrast.  Renally adjust medications for acute kidney injury.  Hypertension/volume blood pressure appears relatively soft has some edema.  Appears to be poorly responding to IV Lasix and metolazone.  Discussed with critical care medicine will transition patient to CRRT 09/02/2019.  Anemia secondary to blood loss from surgery.  Continue to follow no interventions at this time.  Left atrial thrombus on heparin drip history of atrial fibrillation  Status post hemicolectomy and end ileostomy 08/16/2019.   LOS: [place] @TODAY @6 :40 AM

## 2019-09-02 NOTE — Progress Notes (Signed)
CRITICAL VALUE ALERT  Critical Value:  hemoglobin 6.6  Date & Time Notied:  09/02/19 6:51 AM   Provider Notified: Lucile Shutters, MD  Orders Received/Actions taken: acknowledged

## 2019-09-02 NOTE — Procedures (Signed)
Intubation Procedure Note Alan Orr [id number] [birth year]  Procedure: Intubation Indications: Respiratory insufficiency  Procedure Details Consent: Unable to obtain consent because of emergent medical necessity. Time Out: Verified patient identification, verified procedure, site/side was marked, verified correct patient position, special equipment/implants available, medications/allergies/relevent history reviewed, required imaging and test results available.  Performed   MAC  3  Direct laryngoscopy   Evaluation Hemodynamic Status: Transient hypotension treated with pressors; O2 sats: transiently fell during during procedure and currently acceptable Patient's Current Condition: unstable Complications: No apparent complications Patient did tolerate procedure well. Chest X-ray ordered to verify placement.  CXR: pending.   Alan Orr 09/02/2019

## 2019-09-02 NOTE — Progress Notes (Addendum)
PHARMACY - TOTAL PARENTERAL NUTRITION CONSULT NOTE   Indication: Prolonged ileus  Patient Measurements: Height: 6\' 2"  (188 cm) Weight: 108.4 kg (238 lb 15.7 oz) IBW/kg (Calculated) : 82.2 TPN AdjBW (KG): 96 Body mass index is 30.68 kg/m.  Assessment: 65 YOM who presents with abdominal pain, N/V found to have perforated colon and feculent peritonitis now s/p R hemicolectomy with end ileostomy. Given anticipated prolonged ileus with poor baseline nutritional status indicated by a prealbumin < 5, pharmacy consulted to start TPN while allowing for bowel rest.   Glucose / Insulin: A1c 6, CBGs 180-236, required 32 units/24h of standard SSI with 30 units of insulin in TPN bag.  Electrolytes: Na 136, K 4.9, Mg 2.3, CO2 21, corrCa 10.1, Phos up 4.9 in the setting of AKI and uptrending levels and poor response to IV Lasix and metolazone. Stopping Lasix drip and initiating CRRT today - will recheck labs in AM. Will continue with reduced K/Mg/Phos in TPN  Renal: AKI with SCr up to 4.59 (BL 1-1.3)- initiating CRRT 6/19. LFTs / TGs: LFTs down to 156/109, Tbili wnl Prealbumin / albumin: Pre-albumin < 5, albumin 1.4 Intake / Output; MIVF: UOP 0.2 ml/kg/hr, NGT output/24h: ~110cc, per surgery 6/18 to attempt to clamp and try sips of clears. Stool output 1050 mL last 24 hours.  GI Imaging: - 6/14: Free air in the abdomen consistent with bowel perforation ID: Zosyn [id number] for intra-abdominal infection  Surgeries / Procedures:  - 6/15: R hemicolectomy with end ileostomy   Central access: DL CVC TPN start date: 6/15 >>   Nutritional Goals : Per RD recommendations on 6/16: 2400-2600 kcal, 125-150g protein, >2.4L/day  Estimated goal rate of 110 ml/hr will provide 150g protein, 356g CHO, 77g lipids providing 2579 kcal  Current Nutrition:  NPO  Plan:  Will concentrate TPN to help with volume status:  Contentrate TPN to 85 ml/hr to provide 150g protein (~1.5g/kg/day), 356g CHO, 77g lipid and ~2580 kcal to  meet ~100% of patient's estimated needs Electrolytes in TPN: continue 43mEq/L of Na, remove K and Ca as receiving 4 K /2.5 Ca bath with CRRT and will replace outside of TPN under direction of Nephrology as needed, continue with 61mEq/L of Mg, remove Phos, max acetate Add standard MVI and trace elements to TPN Increase to 40 units of insulin R to TPN bag and adjust SSI to Resistant q4h Continue ICU SSI q4h and adjust as needed Monitor TPN labs on Mon/Thurs, BMET, Mg/Phos in AM  Thank you for allowing pharmacy to be a part of this patient's care.  Sloan Leiter, PharmD, BCPS, BCCCP Clinical Pharmacist Please refer to [facility] for [place] numbers 09/02/2019 7:03 AM   **Pharmacist phone directory can now be found on [email or web address] ([email or web address]).  Listed under [place].

## 2019-09-03 LAB — BASIC METABOLIC PANEL
Anion gap: 15 (ref 5–15)
BUN: 89 mg/dL — ABNORMAL HIGH (ref 8–23)
CO2: 12 mmol/L — ABNORMAL LOW (ref 22–32)
Calcium: 7.4 mg/dL — ABNORMAL LOW (ref 8.9–10.3)
Chloride: 108 mmol/L (ref 98–111)
Creatinine, Ser: 3.65 mg/dL — ABNORMAL HIGH (ref 0.61–1.24)
GFR calc Af Amer: 18 mL/min — ABNORMAL LOW (ref >60)
GFR calc non Af Amer: 15 mL/min — ABNORMAL LOW (ref >60)
Glucose, Bld: 193 mg/dL — ABNORMAL HIGH (ref 70–99)
Potassium: 4.9 mmol/L (ref 3.5–5.1)
Sodium: 135 mmol/L (ref 135–145)

## 2019-09-03 LAB — CBC
HCT: 21.8 % — ABNORMAL LOW (ref 39.0–52.0)
HCT: 28.3 % — ABNORMAL LOW (ref 39.0–52.0)
Hemoglobin: 6.7 g/dL — CL (ref 13.0–17.0)
Hemoglobin: 9 g/dL — ABNORMAL LOW (ref 13.0–17.0)
MCH: 33.5 pg (ref 26.0–34.0)
MCH: 32.4 pg (ref 26.0–34.0)
MCHC: 30.7 g/dL (ref 30.0–36.0)
MCHC: 31.8 g/dL (ref 30.0–36.0)
MCV: 109 fL — ABNORMAL HIGH (ref 80.0–100.0)
MCV: 101.8 fL — ABNORMAL HIGH (ref 80.0–100.0)
Platelets: 190 10*3/uL (ref 150–400)
Platelets: 170 10*3/uL (ref 150–400)
RBC: 2 MIL/uL — ABNORMAL LOW (ref 4.22–5.81)
RBC: 2.78 MIL/uL — ABNORMAL LOW (ref 4.22–5.81)
RDW: 17.8 % — ABNORMAL HIGH (ref 11.5–15.5)
RDW: 17.8 % — ABNORMAL HIGH (ref 11.5–15.5)
WBC: 31.4 10*3/uL — ABNORMAL HIGH (ref 4.0–10.5)
WBC: 32.3 10*3/uL — ABNORMAL HIGH (ref 4.0–10.5)
nRBC: 12.6 % — ABNORMAL HIGH (ref 0.0–0.2)
nRBC: 18.8 % — ABNORMAL HIGH (ref 0.0–0.2)

## 2019-09-03 LAB — GLUCOSE, CAPILLARY
Glucose-Capillary: 160 mg/dL — ABNORMAL HIGH (ref 70–99)
Glucose-Capillary: 92 mg/dL (ref 70–99)
Glucose-Capillary: 140 mg/dL — ABNORMAL HIGH (ref 70–99)
Glucose-Capillary: 54 mg/dL — ABNORMAL LOW (ref 70–99)

## 2019-09-03 LAB — RENAL FUNCTION PANEL
Albumin: <1 g/dL — ABNORMAL LOW (ref 3.5–5.0)
Anion gap: 14 (ref 5–15)
BUN: 75 mg/dL — ABNORMAL HIGH (ref 8–23)
CO2: 16 mmol/L — ABNORMAL LOW (ref 22–32)
Calcium: 7 mg/dL — ABNORMAL LOW (ref 8.9–10.3)
Chloride: 106 mmol/L (ref 98–111)
Creatinine, Ser: 3.07 mg/dL — ABNORMAL HIGH (ref 0.61–1.24)
GFR calc Af Amer: 22 mL/min — ABNORMAL LOW (ref >60)
GFR calc non Af Amer: 19 mL/min — ABNORMAL LOW (ref >60)
Glucose, Bld: 164 mg/dL — ABNORMAL HIGH (ref 70–99)
Phosphorus: 4.1 mg/dL (ref 2.5–4.6)
Potassium: 4.7 mmol/L (ref 3.5–5.1)
Sodium: 136 mmol/L (ref 135–145)

## 2019-09-03 LAB — CULTURE, BLOOD (ROUTINE X 2)
Culture: NO GROWTH
Culture: NO GROWTH
Special Requests: ADEQUATE

## 2019-09-03 LAB — MAGNESIUM: Magnesium: 2.1 mg/dL (ref 1.7–2.4)

## 2019-09-03 LAB — LACTIC ACID, PLASMA: Lactic Acid, Venous: 9.5 mmol/L (ref 0.5–1.9)

## 2019-09-03 MED ORDER — ORAL CARE MOUTH RINSE: 15.0000 mL | OROMUCOSAL | Status: DC

## 2019-09-03 MED ORDER — NOREPINEPHRINE 16 MG/250ML-% IV SOLN
0.0000 ug/min | INTRAVENOUS | Status: DC
  Administered 2019-09-03 (×2): 40 ug/min via INTRAVENOUS
  Filled 2019-09-03 (×2): qty 250

## 2019-09-03 MED ORDER — CHLORHEXIDINE GLUCONATE 0.12% ORAL RINSE (MEDLINE KIT): 15.0000 mL | Freq: Two times a day (BID) | OROMUCOSAL | Status: DC

## 2019-09-03 MED FILL — Medication: Qty: 1 | Status: AC

## 2019-09-04 LAB — TYPE AND SCREEN
ABO/RH(D): A POS
Antibody Screen: NEGATIVE
Unit division: 0
Unit division: 0

## 2019-09-04 LAB — PATHOLOGIST SMEAR REVIEW

## 2019-09-04 LAB — BPAM RBC
Blood Product Expiration Date: 202107082359
Blood Product Expiration Date: 202107082359
ISSUE DATE / TIME: 202106192149
ISSUE DATE / TIME: 202106200006
Unit Type and Rh: 6200
Unit Type and Rh: 6200

## 2019-09-14 NOTE — Progress Notes (Signed)
[facility] ROUNDING NOTE   Subjective:   This is a 74 year old gentleman with history of diabetes hypertension coronary artery status post CABG chronic atrial fibrillation ICD in place.  He has a recent history of a fall  with a right humeral fracture and was placed in SNF.  He was brought to the emergency room 08/28/2018 with abdominal pain found to have bowel perforation peritonitis undergoing right hemicolectomy and end ileostomy.  He had some transient hypotension requiring pressors and was placed in the intensive care unit.  He was taking an ACE inhibitor ramipril 2.5 mg prior to admission.  His baseline serum creatinine appears to be about 1.3 mg/dL.  CT scan abdomen and pelvis performed 09/04/2019 did not reveal any evidence of hydronephrosis.  He had a urinalysis performed 08/18/2019 that showed 11-20 RBCs and no evidence of proteinuria.  He was found to have a left atrial thrombus on 2D echo 08/30/2019 which is 2.8 x 1.4 cm mobile protruding thrombus in the left ventricular apex with normal ejection fraction of 83-66% with diastolic dysfunction.  He is continued to develop worsening shock with pressor requirements.  He appears to be dying with probable ongoing intestinal ischemia and resistant metabolic acidosis.  Received 2 units packed red blood cells 09/02/2019  Blood pressure 55/29 pulse 60 temperature 91.9 O2 sats 83% FiO2 50%  Urine output 205 cc cc.  Weight 108.4 kg positive fluid balance 10 kilogram weight gain since admission  Sodium 136 potassium 4.7 chloride 106 CO2 16 BUN 75 creatinine 3.07 glucose 164 calcium 7 phosphorus 4.1 magnesium 2.1 albumin less than 1 hemoglobin 9.0 WBC 32.3.  Insulin sliding scale, levothyroxine 75 mcg IV daily, IV heparin  IV norepinephrine IV vasopressin IV phenylephrine IV Zosyn 3.375 g every 8 hours  Objective:  Vital signs in last 24 hours:  Temp:  [91.9 F (33.3 C)-96.4 F (35.8 C)] 91.9 F (33.3 C) (06/20 0700) Pulse Rate:   [25-85] 41 (06/20 0400) Resp:  [14-26] 23 (06/20 0400) BP: (44-146)/(23-110) 44/24 (06/20 0400) SpO2:  [77 %-99 %] 94 % (06/20 0400) Arterial Line BP: (69-115)/(23-49) 69/28 (06/20 0400) FiO2 (%):  [50 %-100 %] 50 % (06/20 0400)  Weight change:  Filed Weights   08/31/19 0500 09/01/19 0800 09/02/19 0500  Weight: 105.3 kg 108.4 kg 108.4 kg    Intake/Output: I/O last 3 completed shifts: In: 8155.4 [I.V.:7058.7; Blood:835; IV Piggyback:261.6] Out: 2947 [Urine:205; Other:421; MLYYT:0354]   Intake/Output this shift:  No intake/output data recorded.     General:  Intubated sedated Neck: JVD flat Heart: Regular rate and rhythm Lungs: Decreased breath sounds at extreme bases Abdomen: Postoperative, minimal bowel sounds, slightly distended and tender Extremities: 2+ pitting edema throughout Skin: Warm and dry Neuro: Alert, nonfocal   Basic Metabolic Panel: Recent Labs  Lab 08/30/19 0512 08/30/19 0512 08/31/19 0403 08/31/19 0403 09/01/19 0405 09/01/19 0405 09/02/19 0615 09/02/19 2222 09/02/19 2245 18-Sep-2019 0335  NA 141   < > 139   < > 136  --  136 139 135 136  K 4.2   < > 4.6   < > 4.7  --  4.9 5.1 4.9 4.7  CL 110   < > 109  --  106  --  105  --  108 106  CO2 22   < > 21*  --  22  --  21*  --  12* 16*  GLUCOSE 220*   < > 181*  --  216*  --  236*  --  193* 164*  BUN 48*   < > 62*  --  83*  --  115*  --  89* 75*  CREATININE 3.43*   < > 3.76*  --  3.93*  --  4.59*  --  3.65* 3.07*  CALCIUM 7.4*   < > 7.7*   < > 7.9*   < > 8.0*  --  7.4* 7.0*  MG 2.5*  --  2.8*  --  2.5*  --  2.3  --   --  2.1  PHOS 3.5  --  4.1  --  4.2  --  4.9*  --   --  4.1   < > = values in this interval not displayed.    Liver Function Tests: Recent Labs  Lab 08/16/2019 1740 08/29/19 0549 08/30/19 0512 08/31/19 0403 23-Sep-2019 0335  AST 116* 245* 220* 156*  --   ALT 60* 91* 115* 109*  --   ALKPHOS 96 60 73 111  --   BILITOT 1.1 1.2 0.9 0.6  --   PROT 5.7* 4.0* 4.6* 4.5*  --   ALBUMIN 2.0*  1.9* 1.8* 1.4* <1.0*   Recent Labs  Lab 08/24/2019 1740  LIPASE 23   No results for input(s): AMMONIA in the last 168 hours.  CBC: Recent Labs  Lab 08/25/2019 1642 08/29/19 0044 08/29/19 0549 08/29/19 1555 08/30/19 0512 08/30/19 0512 08/31/19 0403 08/31/19 0403 09/01/19 0405 09/02/19 0540 09/02/19 2222 09/02/19 2245 2019/09/23 0335  WBC 11.8*  --  8.1   < > 14.6*   < > 15.9*  --  15.1* 18.5*  --  31.4* 32.3*  NEUTROABS 10.7*  --  7.2  --  13.6*  --   --   --   --   --   --   --   --   HGB 12.8*   < > 8.5*   < > 9.4*   < > 9.1*   < > 8.6* 6.6* 6.1* 6.7* 9.0*  HCT 38.3*   < > 25.9*   < > 28.9*   < > 28.5*   < > 27.4* 21.4* 18.0* 21.8* 28.3*  MCV 107.0*  --  106.6*   < > 108.2*   < > 110.0*  --  111.4* 112.0*  --  109.0* 101.8*  PLT 352  --  296   < > 403*   < > 360  --  331 276  --  190 170   < > = values in this interval not displayed.    Cardiac Enzymes: No results for input(s): CKTOTAL, CKMB, CKMBINDEX, TROPONINI in the last 168 hours.  BNP: Invalid input(s): POCBNP  CBG: Recent Labs  Lab 09/02/19 1104 09/02/19 1503 09/02/19 1929 09/02/19 2335 23-Sep-2019 0337  GLUCAP 227* 259* 237* 162* 160*    Microbiology: Results for orders placed or performed during the hospital encounter of 08/18/2019  SARS Coronavirus 2 by RT PCR (hospital order, performed in [facility] hospital lab) Nasopharyngeal Nasopharyngeal Swab     Status: None   Collection Time: 08/20/2019  7:00 PM   Specimen: Nasopharyngeal Swab  Result Value Ref Range Status   SARS Coronavirus 2 NEGATIVE NEGATIVE Final    Comment: (NOTE) SARS-CoV-2 target nucleic acids are NOT DETECTED.  The SARS-CoV-2 RNA is generally detectable in upper and lower respiratory specimens during the acute phase of infection. The lowest concentration of SARS-CoV-2 viral copies this assay can detect is 250 copies / mL. A negative result does not preclude SARS-CoV-2 infection and  should not be used as the sole basis for treatment or  other patient management decisions.  A negative result may occur with improper specimen collection / handling, submission of specimen other than nasopharyngeal swab, presence of viral mutation(s) within the areas targeted by this assay, and inadequate number of viral copies (<250 copies / mL). A negative result must be combined with clinical observations, patient history, and epidemiological information.  Fact Sheet for Patients:   StrictlyIdeas.no  Fact Sheet for Healthcare Providers: BankingDealers.co.za  This test is not yet approved or  cleared by the [place] FDA and has been authorized for detection and/or diagnosis of SARS-CoV-2 by FDA under an Emergency Use Authorization (EUA).  This EUA will remain in effect (meaning this test can be used) for the duration of the COVID-19 declaration under Section 564(b)(1) of the Act, 21 U.S.C. section 360bbb-3(b)(1), unless the authorization is terminated or revoked sooner.  Performed at [facility], [address], [place], [place] [address]   Culture, blood (Routine X 2) w Reflex to ID Panel     Status: None (Preliminary result)   Collection Time: 08/29/19  5:41 AM   Specimen: BLOOD LEFT HAND  Result Value Ref Range Status   Specimen Description BLOOD LEFT HAND  Final   Special Requests   Final    BOTTLES DRAWN AEROBIC ONLY Blood Culture adequate volume   Culture   Final    NO GROWTH 4 DAYS Performed at [facility], [place] [address], [place], [place] [address]    Report Status PENDING  Incomplete  Culture, blood (Routine X 2) w Reflex to ID Panel     Status: None (Preliminary result)   Collection Time: 08/29/19  5:49 AM   Specimen: BLOOD  Result Value Ref Range Status   Specimen Description BLOOD SITE NOT SPECIFIED  Final   Special Requests   Final    BOTTLES DRAWN AEROBIC ONLY Blood Culture results may not be optimal due to an inadequate volume of blood received in culture  bottles   Culture   Final    NO GROWTH 4 DAYS Performed at [facility], [place] [address] [place], [place] [address]    Report Status PENDING  Incomplete    Coagulation Studies: No results for input(s): LABPROT, INR in the last 72 hours.  Urinalysis: No results for input(s): COLORURINE, LABSPEC, PHURINE, GLUCOSEU, HGBUR, BILIRUBINUR, KETONESUR, PROTEINUR, UROBILINOGEN, NITRITE, LEUKOCYTESUR in the last 72 hours.  Invalid input(s): APPERANCEUR    Imaging: DG CHEST PORT 1 VIEW  Result Date: 09/02/2019 CLINICAL DATA:  Evaluate ETT EXAM: PORTABLE CHEST 1 VIEW COMPARISON:  August 29, 2019 FINDINGS: The ETT is in good position. The NG tube has been removed. An AICD device is identified. Bilateral pleural effusions with underlying atelectasis are new. The a right central line appears to enter via an IJ approach in extend laterally into the subclavian vein. No pneumothorax. IMPRESSION: 1. A right IJ extends laterally into the distal subclavian vein. The patient may benefit from repositioning. This finding is stable. 2. The ETT is in good position. 3. New bilateral pleural effusions with underlying atelectasis. Electronically Signed   By: Dorise Bullion III M.D   On: 09/02/2019 18:49   DG Abd Portable 1V  Result Date: 09/02/2019 CLINICAL DATA:  Evaluate for free air. EXAM: PORTABLE ABDOMEN - 1 VIEW COMPARISON:  October 12, 2004 FINDINGS: Multiple dilated loops of air-filled small bowel are seen within the mid left abdomen. There is no evidence of  free air. Radiopaque surgical clips are seen overlying the medial aspect of the left upper quadrant. No radio-opaque calculi or other significant radiographic abnormality are seen. Radiopaque bilateral pedicle screws are seen throughout the lumbar spine IMPRESSION: Multiple dilated loops of air-filled small bowel within the mid left abdomen, suspicious for small bowel obstruction. Electronically Signed   By: Virgina Norfolk M.D.   On: 09/02/2019 21:14      Medications:   .  prismasol BGK 4/2.5 500 mL/hr at 02-Oct-2019 0449  . sodium chloride Stopped (09/01/19 0949)  . sodium chloride 250 mL (10/02/19 0152)  . calcium gluconate    . dexmedetomidine (PRECEDEX) IV infusion Stopped (09/02/19 1636)  . dextrose 5% lactated ringers Stopped (08/31/19 2127)  . famotidine (PEPCID) IV Stopped (09/02/19 1152)  . heparin Stopped (09/02/19 0827)  . methocarbamol (ROBAXIN) IV    . norepinephrine (LEVOPHED) Adult infusion 40 mcg/min (2019-10-02 0150)  . phenylephrine (NEO-SYNEPHRINE) Adult infusion 400 mcg/min (2019/10/02 6314)  . piperacillin-tazobactam 3.375 g (2019-10-02 0612)  . prismasol BGK 4/2.5 2,000 mL/hr at October 02, 2019 0457  . sodium bicarbonate (isotonic) 1000 mL infusion 250 mL/hr at 02-Oct-2019 0643  . TPN ADULT (ION) 85 mL/hr at 09/02/19 1803  . vasopressin (PITRESSIN) infusion - *FOR SHOCK* 0.04 Units/min (09/02/19 2024)   . chlorhexidine  15 mL Mouth Rinse BID  . Chlorhexidine Gluconate Cloth  6 each Topical Daily  . EPINEPHrine  0.5 mg Intravenous Once  . EPINEPHrine  0.5 mg Intravenous Once  . feeding supplement  1 Container Oral TID BM  . insulin aspart  0-20 Units Subcutaneous Q4H  . levothyroxine  75 mcg Intravenous Daily  . lidocaine  5 mL Infiltration Once  . mouth rinse  15 mL Mouth Rinse q12n4p  . mouth rinse  15 mL Mouth Rinse BID  . sodium chloride flush  10-40 mL Intracatheter Q12H   sodium chloride, fentaNYL (SUBLIMAZE) injection, heparin, methocarbamol (ROBAXIN) IV, ondansetron (ZOFRAN) IV, sodium chloride, sodium chloride flush  Assessment/ Plan:   Acute kidney injury.  Baseline serum creatinine 1.3 status post hemicolectomy and end ileostomy 08/18/2019.  Patient was on ACE inhibitor prior to admission.  Postop course has been complicated by hemodynamic instability.  CT scan did not reveal any evidence of hydronephrosis.  His most recent urinalysis did not reveal any proteinuria but did have some microscopic hematuria.  Not  thought to be a great candidate for long-term dialysis.  Has some marginal urine output. .  Etiology of his acute kidney injury appears to be ischemic ATN.  Patient was started on CRRT 09/02/2019.  Patient appears to becoming progressively more acidotic and hypotensive.  Agree with critical care medicine the prognosis is extremely poor.  Hypotension/shock requiring pressors.  Phenylephrine vasopressin and norepinephrine  Anemia secondary to blood loss from surgery status post 2 units packed red blood cells 09/02/2019  Left atrial thrombus on heparin drip history of atrial fibrillation  Status post hemicolectomy and end ileostomy 09/11/2019.   LOS: Alan Orr @TODAY @7 :11 AM

## 2019-09-14 NOTE — Progress Notes (Signed)
Pt. cardiac time of death 1219. Neither cardiac or respiratory sounds heard bilaterally. Pronounced by Mayford Knife, RN and Thersa Salt, RN at bedside. MD aware. Pt.'s nephew, Driscilla Moats made aware. Pt.'s eye glasses are the only belongings at bedside and sent with patient.

## 2019-09-14 NOTE — Death Summary Note (Signed)
DEATH SUMMARY   Patient Details  Name: Alan Orr MRN: [id number] DOB: [birth year]  Admission/Discharge Information   Admit Date:  09/26/19  Date of Death: Date of Death: 2019-10-02  Time of Death: Time of Death: 07/05/1217  Length of Stay: 06/18/2022  Referring Physician: [name], MD   Reason(s) for Hospitalization  Abdominal pain  Diagnoses  Preliminary cause of death: Septic shock Secondary Diagnoses (including complications and co-morbidities):  Principal Problem:   Bowel perforation (HCC) Active Problems:   Type 2 diabetes mellitus with stage 3 chronic kidney disease (HCC)   Paroxysmal atrial fibrillation (HCC)   NSVT (nonsustained ventricular tachycardia) (HCC)   Septic shock (HCC)   AKI (acute kidney injury) (HCC)   Hypokalemia   ATN (acute tubular necrosis) (HCC)   Acute on chronic combined systolic and diastolic CHF (congestive heart failure) [facility])   Brief Hospital Course (including significant findings, care, treatment, and services provided and events leading to death)  Alan Orr is a 74 y.o. year old male who 74 year old male with past medical history as below, which is significant for coronary artery disease status post CABG, atrial fibrillation on Pradaxa, VT status post AICD, CKD 3. He lives alone and is fairly independent per family (still changes his own motor oil). Recently [facility] to [facility] after fall for R humerus fracture. There was concern for polypharmacy and for inability to self care so he was discharged to SNF. He presented to [facility] emergency department on 09-26-22 with complaints of abdominal distention and tenderness x24 hours.  CT imaging of the abdomen demonstrated bowel perforation general surgery at any [facility] recommended transfer to [facility].  Upon arrival to [facility] he was taken emergently to the operating room by Dr. Kieth Brightly where he underwent right hemicolectomy with end ileostomy.  Fascia was closed in the OR soft tissue  remained open and surgically packed and dressed.  Postoperative he developed progressive hypotension despite almost 5 L of IV fluids since the time of admission.    He continued to be hypotensive requiring increasing amounts of vasopressors with worsening renal function.  An attempt to stabilize his condition we can send the family for CRRT with a goal of fluid removal and electrolyte management.  A dialysis catheter was inserted but unfortunately the patient's condition continued to deteriorate and he required reintubation.  He developed refractory shock thereafter in spite of CRRT initiation.  It was at that point that we had further discussions with the family and it was decided to stop all aggressive support.    Pertinent Labs and Studies  Significant Diagnostic Studies CT ABDOMEN PELVIS WO CONTRAST  Result Date: 09-26-19 CLINICAL DATA:  Diffuse abdominal pain. EXAM: CT ABDOMEN AND PELVIS WITHOUT CONTRAST TECHNIQUE: Multidetector CT imaging of the abdomen and pelvis was performed following the standard protocol without IV contrast. COMPARISON:  CT scan dated 02/16/2007 FINDINGS: Lower chest: There are tiny bilateral pleural effusions. Aortic atherosclerosis. Coronary artery calcifications. CABG. Defibrillator in place. Hepatobiliary: Liver parenchyma is normal. Gallbladder is distended. No bile duct dilatation. Pancreas: Unremarkable. No pancreatic ductal dilatation or surrounding inflammatory changes. Spleen: Spleen is atrophic. Adrenals/Urinary Tract: Normal adrenal glands and kidneys. No hydronephrosis. Bladder is empty. Stomach/Bowel: There is extensive free air in the abdomen. There is also small amount ascites in the abdomen. There are multiple gas bubbles in the ascites around the normal with the right lobe of the liver as well as in the mesentery and most prominently around the patent flexure of  the colon. This may be the site of perforation. No diverticular disease. There is mucosal  thickening of the ascending and hepatic flexure portions of the colon stomach and small bowel appear normal. Appendix is normal. Vascular/Lymphatic: Extensive aortic atherosclerosis. No adenopathy. Reproductive: Prostate is unremarkable. Other: Small ascites. Extensive free air in the abdomen. Slight edema in the subcutaneous fat of the flanks and lateral aspects of the hips. Musculoskeletal: No acute abnormality. Extensive surgical lumbar fusion. IMPRESSION: 1. Extensive free air in the abdomen consistent with perforation of the bowel. The most prominent air in the peritoneal fat is adjacent to the hepatic flexure of the colon and there is edema of the mucosa of the ascending and hepatic flexure portions of the colon suggesting colitis. 2. Tiny bilateral pleural effusions. 3.  Aortic Atherosclerosis (ICD10-I70.0). Critical Value/emergent results were called by telephone at the time of interpretation on 08/18/2019 at 6:55 pm to provider JOSEPH ZAMMIT , who verbally acknowledged these results. Electronically Signed   By: Lorriane Shire M.D.   On: 09/06/2019 19:03   DG Shoulder Right  Result Date: 08/15/2019 CLINICAL DATA:  Fall EXAM: RIGHT SHOULDER - 2+ VIEW COMPARISON:  None. FINDINGS: There is a comminuted mildly displaced impacted fracture seen through the surgical neck of the right humerus involving the inferior portion the greater tuberosity and the medial humeral head. The humeral head still partially articulates with the glenoid. There is diffuse osteopenia. Overlying soft tissue swelling is noted. IMPRESSION: Comminuted mildly displaced proximal humerus fracture. Electronically Signed   By: Prudencio Pair M.D.   On: 08/15/2019 01:29   DG Elbow Complete Right  Result Date: 08/17/2019 CLINICAL DATA:  Recent fall with proximal humeral fracture, initial encounter EXAM: RIGHT ELBOW - COMPLETE 3+ VIEW COMPARISON:  10/04/2017 FINDINGS: Degenerative changes are noted about the elbow joint. A joint effusion is seen  although no acute fracture is noted. There is some remodeling of the radial head noted which appears chronic in nature similar to that noted on the prior exam. IMPRESSION: Chronic degenerative changes with remodeling of the radial head. Joint effusion is noted but stable from the prior exam from 2 years ago. Electronically Signed   By: Inez Catalina M.D.   On: 08/17/2019 19:48   CT HEAD WO CONTRAST  Result Date: 08/18/2019 CLINICAL DATA:  Several recent falls.  Headache.  Dizziness. EXAM: CT HEAD WITHOUT CONTRAST TECHNIQUE: Contiguous axial images were obtained from the base of the skull through the vertex without intravenous contrast. COMPARISON:  August 15, 2019 FINDINGS: Brain: Mild to moderate diffuse atrophy is stable. There is no intracranial mass, hemorrhage, extra-axial fluid collection, or midline shift. There is slight small vessel disease in the centra semiovale bilaterally. No acute infarct evident. Vascular: No hyperdense vessel. There is calcification in each carotid siphon region. Skull: Bony calvarium appears intact. Sinuses/Orbits: There is mucosal thickening in the right maxillary antrum. There is mucosal thickening in several ethmoid air cells. Orbits appear symmetric bilaterally. Other: Mastoid air cells are clear. 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 No acute infarct. No mass or hemorrhage. There are foci of arterial vascular calcification. There are foci of paranasal sinus disease. Electronically Signed   By: Lowella Grip III M.D.   On: 08/18/2019 13:42   CT Head Wo Contrast  Result Date: 08/15/2019 CLINICAL DATA:  Fall EXAM: CT HEAD WITHOUT CONTRAST TECHNIQUE: Contiguous axial images were obtained from the base of the skull through the vertex without intravenous contrast. COMPARISON:  None. FINDINGS: Brain:  There is atrophy and chronic small vessel disease changes. No acute intracranial abnormality. Specifically, no hemorrhage, hydrocephalus, mass  lesion, acute infarction, or significant intracranial injury. Vascular: No hyperdense vessel or unexpected calcification. Skull: No acute calvarial abnormality. Sinuses/Orbits: Visualized paranasal sinuses and mastoids clear. Orbital soft tissues unremarkable. Other: None IMPRESSION: Atrophy, chronic microvascular disease. No acute intracranial abnormality. Electronically Signed   By: Rolm Baptise M.D.   On: 08/15/2019 17:30   CT Cervical Spine Wo Contrast  Result Date: 08/15/2019 CLINICAL DATA:  Fall EXAM: CT CERVICAL SPINE WITHOUT CONTRAST TECHNIQUE: Multidetector CT imaging of the cervical spine was performed without intravenous contrast. Multiplanar CT image reconstructions were also generated. COMPARISON:  None. FINDINGS: Alignment: Normal Skull base and vertebrae: No acute fracture. No primary bone lesion or focal pathologic process. Soft tissues and spinal canal: No prevertebral fluid or swelling. No visible canal hematoma. Disc levels:  Diffuse degenerative disc disease and facet disease. Upper chest: Old healed right rib fractures.  No acute findings. Other: Carotid artery calcifications. IMPRESSION: Degenerative disc and facet disease.  No acute bony abnormality. Electronically Signed   By: Rolm Baptise M.D.   On: 08/15/2019 17:32   DG CHEST PORT 1 VIEW  Result Date: 09/02/2019 CLINICAL DATA:  Evaluate ETT EXAM: PORTABLE CHEST 1 VIEW COMPARISON:  August 29, 2019 FINDINGS: The ETT is in good position. The NG tube has been removed. An AICD device is identified. Bilateral pleural effusions with underlying atelectasis are new. The a right central line appears to enter via an IJ approach in extend laterally into the subclavian vein. No pneumothorax. IMPRESSION: 1. A right IJ extends laterally into the distal subclavian vein. The patient may benefit from repositioning. This finding is stable. 2. The ETT is in good position. 3. New bilateral pleural effusions with underlying atelectasis. Electronically  Signed   By: Dorise Bullion III M.D   On: 09/02/2019 18:49   DG Chest Port 1 View  Result Date: 08/29/2019 CLINICAL DATA:  NG tube placement EXAM: PORTABLE CHEST 1 VIEW COMPARISON:  12/25/2017 FINDINGS: NG tube is in place with the tip in the stomach. A ICD within the heart, unchanged. Prior CABG. IMPRESSION: NG tube tip in the stomach. Electronically Signed   By: Rolm Baptise M.D.   On: 08/29/2019 02:38   DG Abd Portable 1V  Result Date: 09/02/2019 CLINICAL DATA:  Evaluate for free air. EXAM: PORTABLE ABDOMEN - 1 VIEW COMPARISON:  October 12, 2004 FINDINGS: Multiple dilated loops of air-filled small bowel are seen within the mid left abdomen. There is no evidence of free air. Radiopaque surgical clips are seen overlying the medial aspect of the left upper quadrant. No radio-opaque calculi or other significant radiographic abnormality are seen. Radiopaque bilateral pedicle screws are seen throughout the lumbar spine IMPRESSION: Multiple dilated loops of air-filled small bowel within the mid left abdomen, suspicious for small bowel obstruction. Electronically Signed   By: Virgina Norfolk M.D.   On: 09/02/2019 21:14   ECHOCARDIOGRAM COMPLETE  Result Date: 08/30/2019    ECHOCARDIOGRAM REPORT   Patient Name:   Alan Orr Date of Exam: 08/30/2019 Medical Rec #:  [id number]        Height:       74.0 in Accession #:    [id number]       Weight:       213.8 lb Date of Birth:  07/22/1945        BSA:          2.236 m  Patient Age:    75 years         BP:           118/55 mmHg Patient Gender: M                HR:           78 bpm. Exam Location:  Inpatient Procedure: 2D Echo, Color Doppler, Cardiac Doppler, Intracardiac Opacification            Agent and 3D Echo Indications:    Elevated Troponins  History:        Patient has prior history of Echocardiogram examinations, most                 recent 01/11/2015. Pacemaker, Defibrillator and Prior CABG,                 Arrythmias:Atrial Fibrillation; Risk  Factors:Hypertension,                 Diabetes and Dyslipidemia.  Sonographer:    Raquel Sarna Senior RDCS Referring Phys: [id number] [facility]  Sonographer Comments: Scanned supine due to arm fracture. IMPRESSIONS  1. There is a 2.8 x 1.4 cm mobile protruding thrombus in the LV apex. Left ventricular ejection fraction, by estimation, is 60 to 65%. The left ventricle has normal function. The left ventricle has no regional wall motion abnormalities. Left ventricular  diastolic parameters are consistent with Grade I diastolic dysfunction (impaired relaxation). There is akinesis of the left ventricular, entire apical segment.  2. Right ventricular systolic function is normal. The right ventricular size is normal. Tricuspid regurgitation signal is inadequate for assessing PA pressure.  3. The mitral valve is normal in structure. No evidence of mitral valve regurgitation. No evidence of mitral stenosis.  4. The aortic valve was not well visualized. Aortic valve regurgitation is not visualized. No aortic stenosis is present.  5. The inferior vena cava is normal in size with <50% respiratory variability, suggesting right atrial pressure of 8 mmHg.  6. Report of LV apical thrombus called to Dr. Chase Caller FINDINGS  Left Ventricle: There is a 2.8 x 1.4 cm mobile protruding thrombus in the LV apex. Left ventricular ejection fraction, by estimation, is 60 to 65%. The left ventricle has normal function. The left ventricle has no regional wall motion abnormalities. Definity contrast agent was given IV to delineate the left ventricular endocardial borders. The left ventricular internal cavity size was normal in size. There is no left ventricular hypertrophy. Left ventricular diastolic parameters are consistent with Grade I diastolic dysfunction (impaired relaxation). Normal left ventricular filling pressure. Right Ventricle: The right ventricular size is normal. No increase in right ventricular wall thickness. Right ventricular systolic  function is normal. Tricuspid regurgitation signal is inadequate for assessing PA pressure. Left Atrium: Left atrial size was normal in size. Right Atrium: Right atrial size was normal in size. Pericardium: There is no evidence of pericardial effusion. Mitral Valve: The mitral valve is normal in structure. Normal mobility of the mitral valve leaflets. No evidence of mitral valve regurgitation. No evidence of mitral valve stenosis. Tricuspid Valve: The tricuspid valve is normal in structure. Tricuspid valve regurgitation is not demonstrated. No evidence of tricuspid stenosis. Aortic Valve: The aortic valve was not well visualized. Aortic valve regurgitation is not visualized. No aortic stenosis is present. Pulmonic Valve: The pulmonic valve was normal in structure. Pulmonic valve regurgitation is not visualized. No evidence of pulmonic stenosis. Aorta: The aortic root is normal in size and  structure. Venous: The inferior vena cava is normal in size with less than 50% respiratory variability, suggesting right atrial pressure of 8 mmHg. IAS/Shunts: No atrial level shunt detected by color flow Doppler. Additional Comments: A pacer wire is visualized.  LEFT VENTRICLE PLAX 2D LVIDd:         5.40 cm  Diastology LVIDs:         4.10 cm  LV e' lateral:   8.59 cm/s LV PW:         0.90 cm  LV E/e' lateral: 7.9 LV IVS:        0.90 cm  LV e' medial:    6.96 cm/s LVOT diam:     2.00 cm  LV E/e' medial:  9.7 LV SV:         58 LV SV Index:   26 LVOT Area:     3.14 cm  RIGHT VENTRICLE RV S prime:     7.72 cm/s TAPSE (M-mode): 1.6 cm LEFT ATRIUM           Index       RIGHT ATRIUM           Index LA diam:      3.50 cm 1.57 cm/m  RA Area:     12.50 cm LA Vol (A4C): 75.7 ml 33.85 ml/m RA Volume:   25.60 ml  11.45 ml/m  AORTIC VALVE LVOT Vmax:   85.50 cm/s LVOT Vmean:  65.800 cm/s LVOT VTI:    0.186 m  AORTA Ao Root diam: 3.40 cm MITRAL VALVE MV Area (PHT): 3.34 cm    SHUNTS MV Decel Time: 227 msec    Systemic VTI:  0.19 m MV E  velocity: 67.70 cm/s  Systemic Diam: 2.00 cm MV A velocity: 81.00 cm/s MV E/A ratio:  0.84 Fransico Him MD Electronically signed by Fransico Him MD Signature Date/Time: 08/30/2019/4:44:51 PM    Final     Microbiology No results found for this or any previous visit (from the past 240 hour(s)).  Lab Basic Metabolic Panel: No results for input(s): NA, K, CL, CO2, GLUCOSE, BUN, CREATININE, CALCIUM, MG, PHOS in the last 168 hours. Liver Function Tests: No results for input(s): AST, ALT, ALKPHOS, BILITOT, PROT, ALBUMIN in the last 168 hours. No results for input(s): LIPASE, AMYLASE in the last 168 hours. No results for input(s): AMMONIA in the last 168 hours. CBC: No results for input(s): WBC, NEUTROABS, HGB, HCT, MCV, PLT in the last 168 hours. Cardiac Enzymes: No results for input(s): CKTOTAL, CKMB, CKMBINDEX, TROPONINI in the last 168 hours. Sepsis Labs: No results for input(s): PROCALCITON, WBC, LATICACIDVEN in the last 168 hours.  Procedures/Operations   Hemicolectomy with end ileostomy.  Mechanical ventilation.  Central line placement.  Continuous renal replacement therapy.  Javanna Patin 09/12/2019, 1:14 PM

## 2019-09-14 NOTE — Procedures (Signed)
Extubation Procedure Note  Patient Details:   Name: Alan Orr DOB: 1945/11/30 MRN: [id number]   Airway Documentation:  Airway 7.5 mm (Active)  Secured at (cm) 24 cm September 13, 2019 1127  Measured From Lips 09-13-19 Catarina 09/13/2019 1127  Secured By [employer] 09/13/19 1127  Tube Holder Repositioned Yes 2019-09-13 1127  Cuff Pressure (cm H2O) 30 cm H2O 09/02/19 2041  Site Condition Dry 09-13-2019 1127   Vent end date: (not recorded) Vent end time: (not recorded)   Evaluation  O2 sats: currently acceptable Complications: No apparent complications Patient did tolerate procedure well. Bilateral Breath Sounds: Diminished, Coarse crackles   No    Pt terminally extubated per MD order.  RN at bedside.  RT will continue to monitor.  Valora Piccolo 2019/09/13, 1:25 PM

## 2019-09-14 NOTE — Progress Notes (Addendum)
6 Days Post-Op  Subjective: Patient sedated on vent.  Events from overnight noted.  On 3 pressors with A-line of 60/40  ROS: unable  Objective: Vital signs in last 24 hours: Temp:  [91.9 F (33.3 C)-96.4 F (35.8 C)] 91.9 F (33.3 C) (06/20 0700) Pulse Rate:  [25-85] 25 (06/20 0745) Resp:  [14-26] 22 (06/20 0745) BP: (44-146)/(23-110) 44/24 (06/20 0400) SpO2:  [77 %-99 %] 83 % (06/20 0745) Arterial Line BP: (63-115)/(23-49) 64/29 (06/20 0745) FiO2 (%):  [50 %-100 %] 50 % (06/20 0740) Last BM Date: 09/02/19  Intake/Output from previous day: 06/19 0701 - 06/20 0700 In: 7157.9 [I.V.:6065.5; Blood:835; IV Piggyback:257.4] Out: 701 [Urine:5; Stool:200] Intake/Output this shift: No intake/output data recorded.  PE: Abd: soft, midline wound is clean, ileostomy with dark serosang output, stoma is now dusky, but not unsurprising given number of pressors  Lab Results:  Recent Labs    09/02/19 2245 09/30/19 0335  WBC 31.4* 32.3*  HGB 6.7* 9.0*  HCT 21.8* 28.3*  PLT 190 170   BMET Recent Labs    09/02/19 2245 2019/09/30 0335  NA 135 136  K 4.9 4.7  CL 108 106  CO2 12* 16*  GLUCOSE 193* 164*  BUN 89* 75*  CREATININE 3.65* 3.07*  CALCIUM 7.4* 7.0*   PT/INR No results for input(s): LABPROT, INR in the last 72 hours. CMP     Component Value Date/Time   NA 136 09/30/2019 0335   NA 144 06/26/2019 0930   K 4.7 30-Sep-2019 0335   CL 106 09-30-2019 0335   CO2 16 (L) 09-30-2019 0335   GLUCOSE 164 (H) 2019/09/30 0335   BUN 75 (H) 2019/09/30 0335   BUN 15 06/26/2019 0930   CREATININE 3.07 (H) 30-Sep-2019 0335   CREATININE 1.42 (H) 09/20/2015 1004   CALCIUM 7.0 (L) 09-30-19 0335   PROT 4.5 (L) 08/31/2019 0403   PROT 6.6 06/26/2019 0930   ALBUMIN <1.0 (L) September 30, 2019 0335   ALBUMIN 3.6 (L) 06/26/2019 0930   AST 156 (H) 08/31/2019 0403   ALT 109 (H) 08/31/2019 0403   ALKPHOS 111 08/31/2019 0403   BILITOT 0.6 08/31/2019 0403   BILITOT 0.6 06/26/2019 0930    GFRNONAA 19 (L) 09/30/19 0335   GFRAA 22 (L) 09-30-19 0335   Lipase     Component Value Date/Time   LIPASE 23 08/29/2019 1740       Studies/Results: DG CHEST PORT 1 VIEW  Result Date: 09/02/2019 CLINICAL DATA:  Evaluate ETT EXAM: PORTABLE CHEST 1 VIEW COMPARISON:  August 29, 2019 FINDINGS: The ETT is in good position. The NG tube has been removed. An AICD device is identified. Bilateral pleural effusions with underlying atelectasis are new. The a right central line appears to enter via an IJ approach in extend laterally into the subclavian vein. No pneumothorax. IMPRESSION: 1. A right IJ extends laterally into the distal subclavian vein. The patient may benefit from repositioning. This finding is stable. 2. The ETT is in good position. 3. New bilateral pleural effusions with underlying atelectasis. Electronically Signed   By: Dorise Bullion III M.D   On: 09/02/2019 18:49   DG Abd Portable 1V  Result Date: 09/02/2019 CLINICAL DATA:  Evaluate for free air. EXAM: PORTABLE ABDOMEN - 1 VIEW COMPARISON:  October 12, 2004 FINDINGS: Multiple dilated loops of air-filled small bowel are seen within the mid left abdomen. There is no evidence of free air. Radiopaque surgical clips are seen overlying the medial aspect of the left upper quadrant.  No radio-opaque calculi or other significant radiographic abnormality are seen. Radiopaque bilateral pedicle screws are seen throughout the lumbar spine IMPRESSION: Multiple dilated loops of air-filled small bowel within the mid left abdomen, suspicious for small bowel obstruction. Electronically Signed   By: Virgina Norfolk M.D.   On: 09/02/2019 21:14    Anti-infectives: Anti-infectives (From admission, onward)   Start     Dose/Rate Route Frequency Ordered Stop   September 15, 2019 0030  piperacillin-tazobactam (ZOSYN) IVPB 3.375 g  Status:  Discontinued        3.375 g 100 mL/hr over 30 Minutes Intravenous Every 6 hours 09/02/19 1859 09/02/19 1902   09/02/19 2000   piperacillin-tazobactam (ZOSYN) IVPB 3.375 g     Discontinue     3.375 g 100 mL/hr over 30 Minutes Intravenous Every 6 hours 09/02/19 1902     08/29/19 1200  piperacillin-tazobactam (ZOSYN) IVPB 3.375 g  Status:  Discontinued        3.375 g 12.5 mL/hr over 240 Minutes Intravenous Every 8 hours 08/29/19 0405 09/02/19 1859   08/29/19 0415  piperacillin-tazobactam (ZOSYN) IVPB 3.375 g        3.375 g 12.5 mL/hr over 240 Minutes Intravenous STAT 08/29/19 0405 08/29/19 0954   09/08/2019 1915  piperacillin-tazobactam (ZOSYN) IVPB 3.375 g        3.375 g 100 mL/hr over 30 Minutes Intravenous  Once 09/02/2019 1905 08/31/2019 2136       Assessment/Plan CAD s/p CABGx5 PAF - normally on pradaxa Biventricular ICD Ischemic cardiomyopathy HTN HLD AKI on Stage III CKD - worsening, per renal and CCM, may need CRRT Hx of DVT T2DM- SSI Hypothyroidism Hypokalemia/hypocalcemia - resolved Septic shock-pressors off, improving PCM - prealbumin <5. TNA initiated LV thrombus - ok for heparin gtt, no bolus Anemia - hgb 6.6 today.  Heparin gtt on hold.  Transfuse per CCM   POD5,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 Albertina Senegal perforated R colon with feculent peritonitis - patient now DNR -BP 60/40 on 3 pressors and not tolerating CRRT -patient has a very poor prognosis -will continue to follow, but nothing else to do surgically at this time.   FEN: on vent VTE: SCDs,heparin gtt on hold due to anemia ID: IV Zosyn 6/14>>   LOS: 6 days    Henreitta Cea , [facility] Surgery 2019/09/15, 8:41 AM Please see Amion for pager number during day hours 7:00am-4:30pm or 7:00am -11:30am on weekends  Patient deceased by the time I followed Kelly's visit.  Alphonsa Overall, MD, [facility] Surgery Office phone:  [phone]

## 2019-09-14 DEATH — deceased

## 2021-09-03 IMAGING — CT CT HEAD W/O CM
4 series · 16 of 47 positions shown, 18 images · non-contrast
Comparison: August 15, 2019

CLINICAL DATA: Several recent falls.  Headache.  Dizziness.

EXAM:
CT HEAD WITHOUT CONTRAST
TECHNIQUE: Contiguous axial images were obtained from the base of the skull
through the vertex without intravenous contrast.

[Series 2: head w o · axial · 0.44mm/px · z∈[-12,+103]mm · 7 of 31 slices shown, 9 images]
[im 4/31  brain]
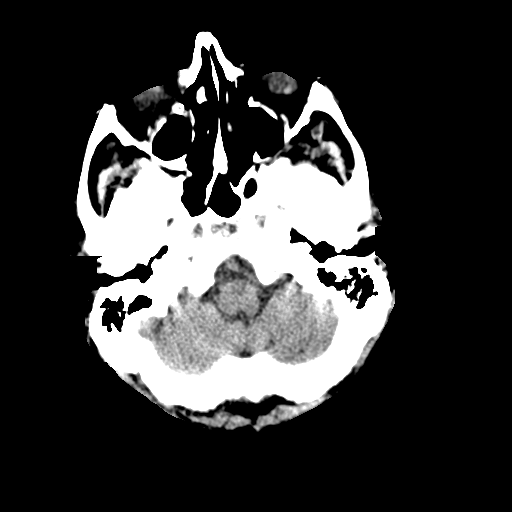
[im 4/31  bone]
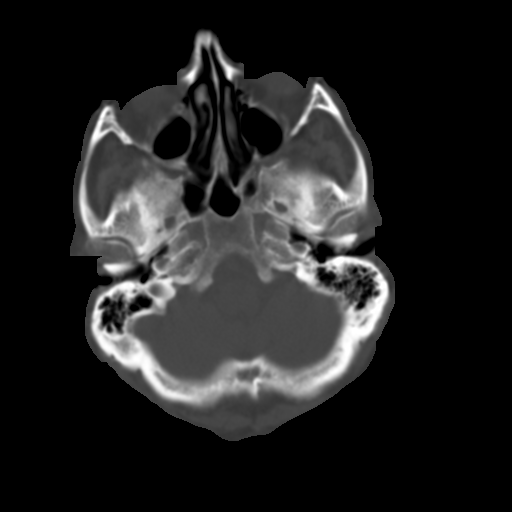
[im 8/31  brain]
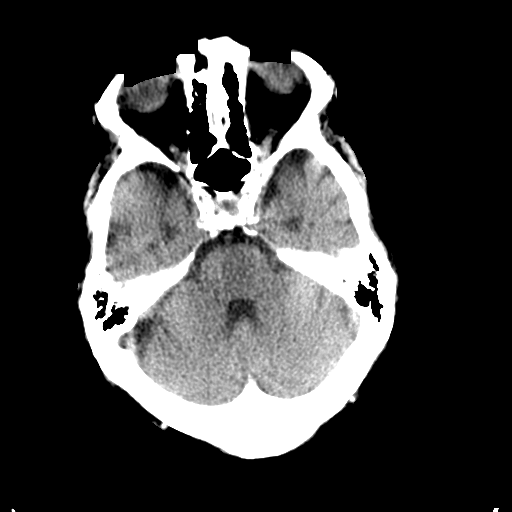
[im 12/31  brain]
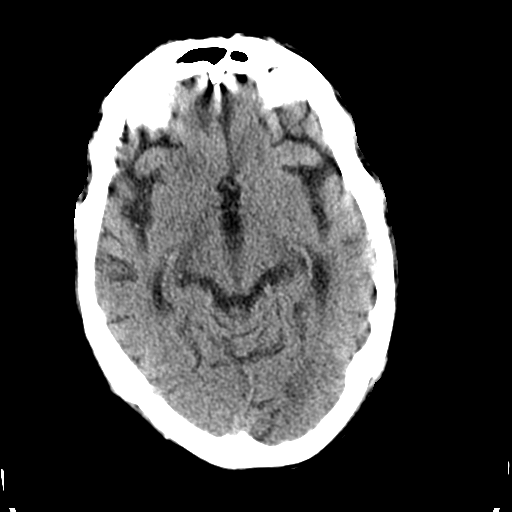
[im 16/31  brain]
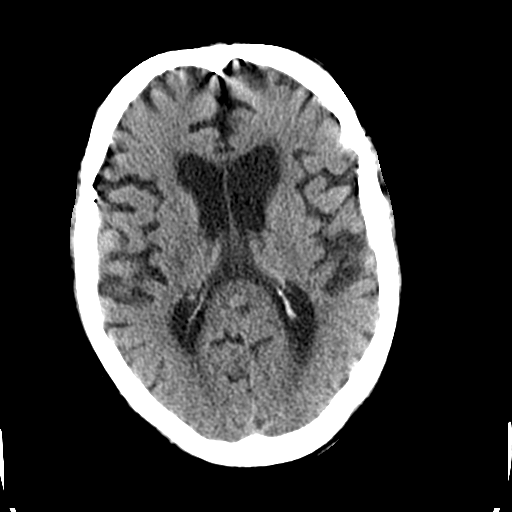
[im 19/31  brain]
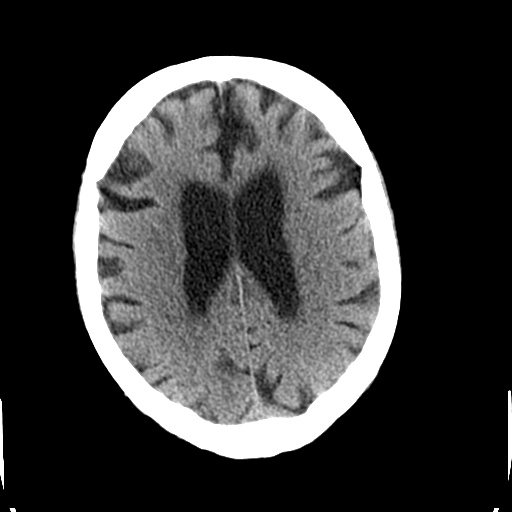
[im 19/31  bone]
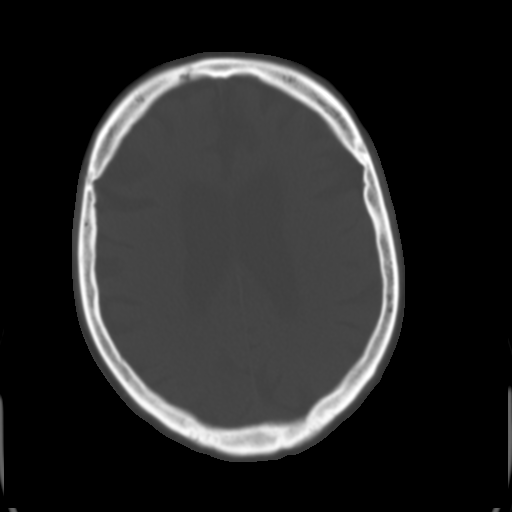
[im 23/31  brain]
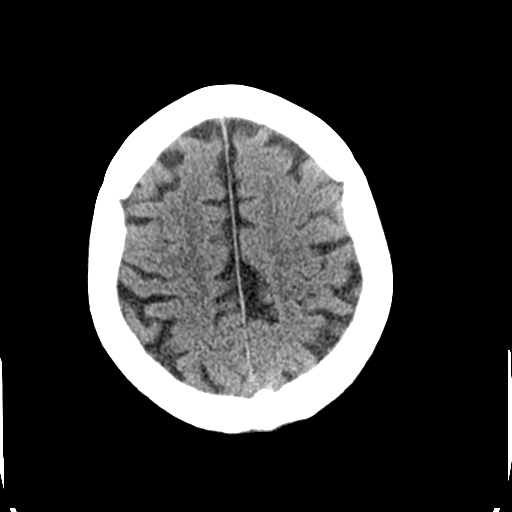
[im 27/31  brain]
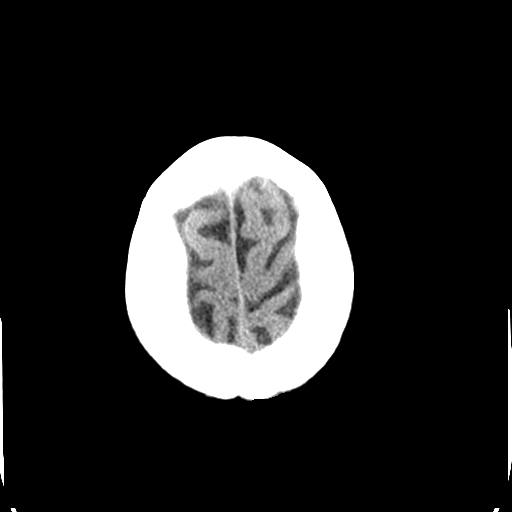

[Series 3: head bone · axial · 0.44mm/px · z∈[-13,+19]mm · 3 of 78 slices shown]
[im 8/78  bone]
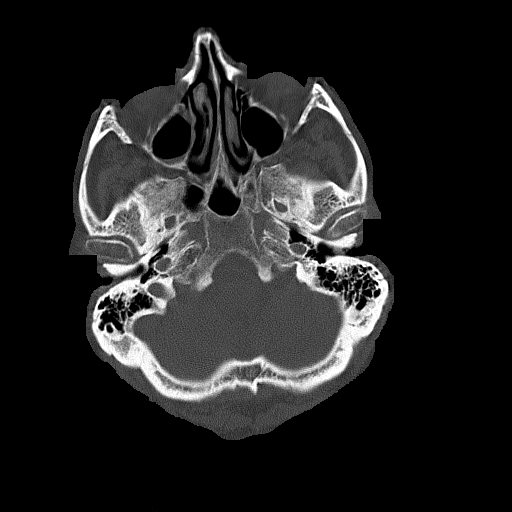
[im 16/78  bone]
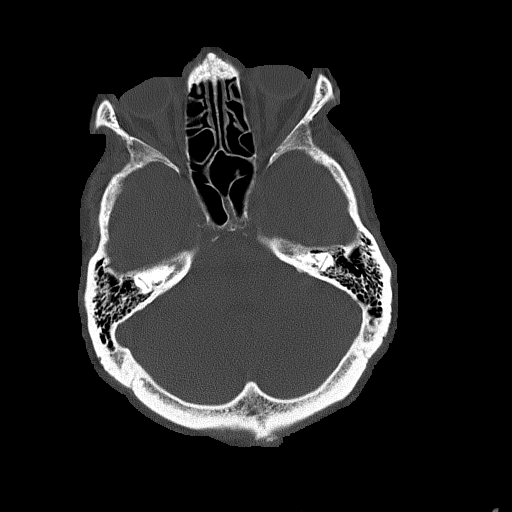
[im 24/78  bone]
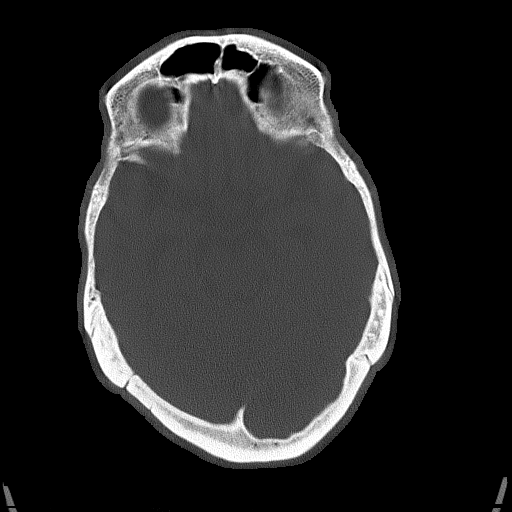

[Series 4: coronal soft · coronal · 0.32mm/px · 3 of 72 slices shown]
[im 24/72  brain]
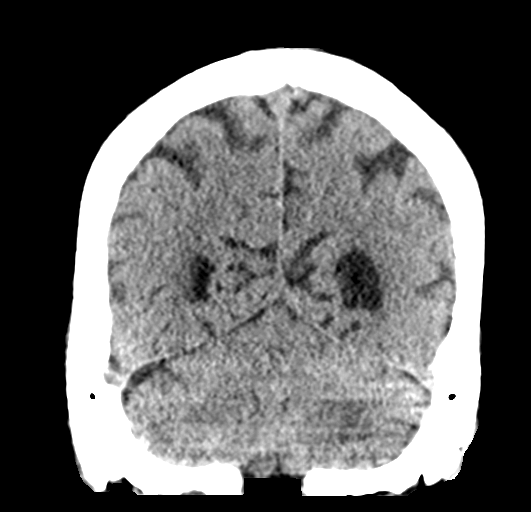
[im 32/72  brain]
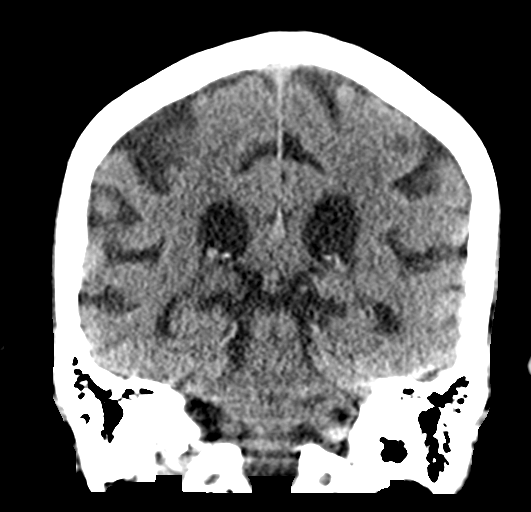
[im 40/72  brain]
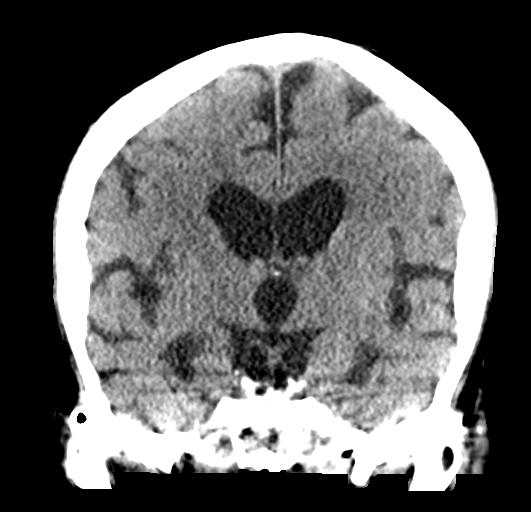

[Series 5: sagittal soft · sagittal · 0.32mm/px · 3 of 56 slices shown]
[im 19/56  brain]
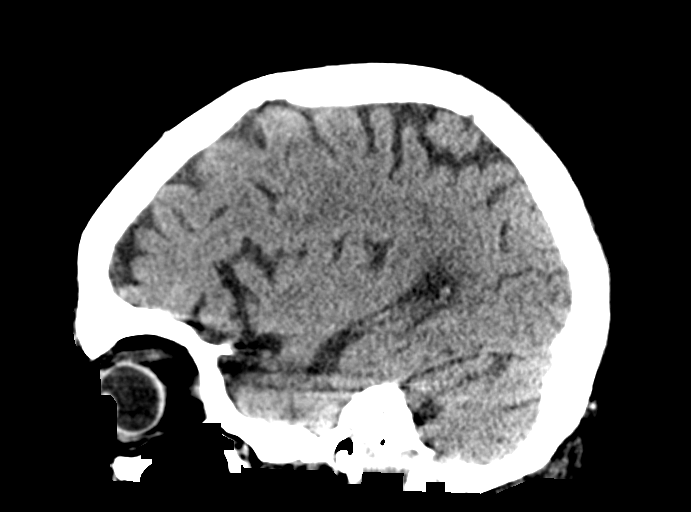
[im 28/56  brain]
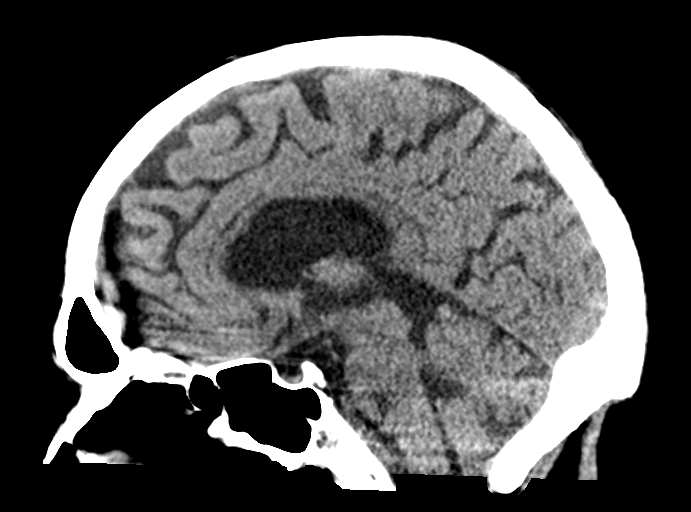
[im 37/56  brain]
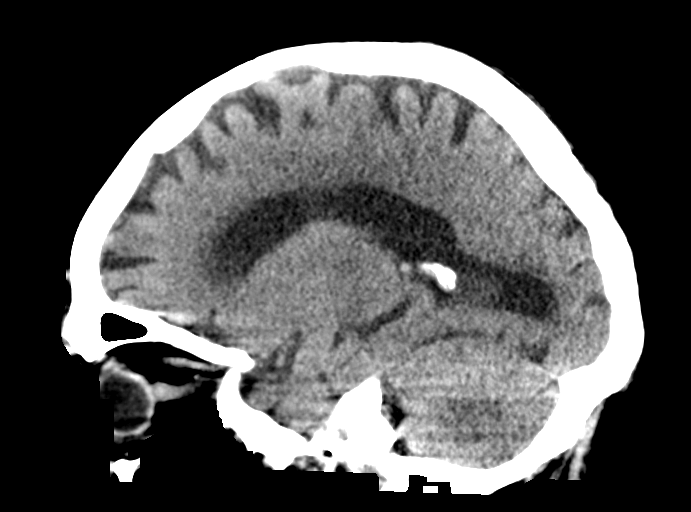

[16 of 47 positions shown; findings below may reference images not displayed]

FINDINGS: Brain: Mild to moderate diffuse atrophy is stable. There is no
intracranial mass, hemorrhage, extra-axial fluid collection, or
midline shift. There is slight small vessel disease in the centra
semiovale bilaterally. No acute infarct evident.

Vascular: No hyperdense vessel. There is calcification in each
carotid siphon region.

Skull: Bony calvarium appears intact.

Sinuses/Orbits: There is mucosal thickening in the right maxillary
antrum. There is mucosal thickening in several ethmoid air cells.
Orbits appear symmetric bilaterally.

Other: Mastoid air cells are clear.
IMPRESSION: Stable atrophy with slight periventricular small vessel disease. No
acute infarct. No mass or hemorrhage.

There are foci of arterial vascular calcification. There are foci of
paranasal sinus disease.
